# Patient Record
Sex: Female | Born: 1940 | Race: Black or African American | Hispanic: No | Smoking: Never smoker
Health system: Southern US, Community
[De-identification: ages and names within clinical notes are randomized; demographics above are authoritative.]

## PROBLEM LIST (undated history)

## (undated) DIAGNOSIS — T7840XA Allergy, unspecified, initial encounter: Secondary | ICD-10-CM

## (undated) DIAGNOSIS — S62317A Displaced fracture of base of fifth metacarpal bone. left hand, initial encounter for closed fracture: Secondary | ICD-10-CM

## (undated) DIAGNOSIS — H04129 Dry eye syndrome of unspecified lacrimal gland: Secondary | ICD-10-CM

## (undated) DIAGNOSIS — G589 Mononeuropathy, unspecified: Secondary | ICD-10-CM

## (undated) DIAGNOSIS — H269 Unspecified cataract: Secondary | ICD-10-CM

## (undated) DIAGNOSIS — M1990 Unspecified osteoarthritis, unspecified site: Secondary | ICD-10-CM

## (undated) DIAGNOSIS — D649 Anemia, unspecified: Secondary | ICD-10-CM

## (undated) DIAGNOSIS — F419 Anxiety disorder, unspecified: Secondary | ICD-10-CM

## (undated) DIAGNOSIS — E785 Hyperlipidemia, unspecified: Secondary | ICD-10-CM

## (undated) DIAGNOSIS — J300 Vasomotor rhinitis: Secondary | ICD-10-CM

## (undated) DIAGNOSIS — L509 Urticaria, unspecified: Secondary | ICD-10-CM

## (undated) DIAGNOSIS — I10 Essential (primary) hypertension: Secondary | ICD-10-CM

## (undated) DIAGNOSIS — R51 Headache: Secondary | ICD-10-CM

## (undated) DIAGNOSIS — D696 Thrombocytopenia, unspecified: Principal | ICD-10-CM

## (undated) DIAGNOSIS — F1910 Other psychoactive substance abuse, uncomplicated: Secondary | ICD-10-CM

## (undated) HISTORY — DX: Headache: R51

## (undated) HISTORY — PX: TONSILLECTOMY: SUR1361

## (undated) HISTORY — DX: Other psychoactive substance abuse, uncomplicated: F19.10

## (undated) HISTORY — PX: HERNIA REPAIR: SHX51

## (undated) HISTORY — DX: Hyperlipidemia, unspecified: E78.5

## (undated) HISTORY — DX: Thrombocytopenia, unspecified: D69.6

## (undated) HISTORY — PX: DILATION AND CURETTAGE OF UTERUS: SHX78

## (undated) HISTORY — DX: Essential (primary) hypertension: I10

## (undated) HISTORY — DX: Unspecified cataract: H26.9

## (undated) HISTORY — PX: UMBILICAL HERNIA REPAIR: SHX196

## (undated) HISTORY — PX: OTHER SURGICAL HISTORY: SHX169

## (undated) HISTORY — DX: Anxiety disorder, unspecified: F41.9

## (undated) HISTORY — DX: Urticaria, unspecified: L50.9

## (undated) HISTORY — DX: Anemia, unspecified: D64.9

## (undated) HISTORY — DX: Allergy, unspecified, initial encounter: T78.40XA

## (undated) HISTORY — DX: Dry eye syndrome of unspecified lacrimal gland: H04.129

## (undated) HISTORY — PX: ABDOMINAL HYSTERECTOMY: SHX81

## (undated) HISTORY — DX: Unspecified osteoarthritis, unspecified site: M19.90

## (undated) HISTORY — DX: Vasomotor rhinitis: J30.0

## (undated) HISTORY — DX: Displaced fracture of base of fifth metacarpal bone, left hand, initial encounter for closed fracture: S62.317A

## (undated) HISTORY — PX: APPENDECTOMY: SHX54

---

## 1998-04-17 ENCOUNTER — Ambulatory Visit (HOSPITAL_COMMUNITY): Admission: RE | Admit: 1998-04-17 | Discharge: 1998-04-17 | Payer: Self-pay | Admitting: Internal Medicine

## 1998-12-29 ENCOUNTER — Inpatient Hospital Stay (HOSPITAL_COMMUNITY): Admission: EM | Admit: 1998-12-29 | Discharge: 1998-12-30 | Payer: Self-pay

## 1999-01-11 ENCOUNTER — Ambulatory Visit (HOSPITAL_COMMUNITY): Admission: RE | Admit: 1999-01-11 | Discharge: 1999-01-11 | Payer: Self-pay | Admitting: General Surgery

## 1999-01-11 ENCOUNTER — Encounter: Payer: Self-pay | Admitting: General Surgery

## 1999-01-15 ENCOUNTER — Ambulatory Visit (HOSPITAL_COMMUNITY): Admission: RE | Admit: 1999-01-15 | Discharge: 1999-01-15 | Payer: Self-pay | Admitting: General Surgery

## 1999-01-15 ENCOUNTER — Encounter: Payer: Self-pay | Admitting: General Surgery

## 2001-02-04 ENCOUNTER — Other Ambulatory Visit: Admission: RE | Admit: 2001-02-04 | Discharge: 2001-02-04 | Payer: Self-pay | Admitting: Gynecology

## 2001-08-04 ENCOUNTER — Other Ambulatory Visit: Admission: RE | Admit: 2001-08-04 | Discharge: 2001-08-04 | Payer: Self-pay | Admitting: Gynecology

## 2002-02-11 ENCOUNTER — Other Ambulatory Visit: Admission: RE | Admit: 2002-02-11 | Discharge: 2002-02-11 | Payer: Self-pay | Admitting: *Deleted

## 2002-06-20 ENCOUNTER — Other Ambulatory Visit: Admission: RE | Admit: 2002-06-20 | Discharge: 2002-06-20 | Payer: Self-pay | Admitting: Gynecology

## 2003-01-19 ENCOUNTER — Encounter: Admission: RE | Admit: 2003-01-19 | Discharge: 2003-01-19 | Payer: Self-pay | Admitting: Family Medicine

## 2003-01-19 ENCOUNTER — Encounter: Payer: Self-pay | Admitting: Family Medicine

## 2003-03-03 ENCOUNTER — Encounter: Admission: RE | Admit: 2003-03-03 | Discharge: 2003-03-03 | Payer: Self-pay | Admitting: Internal Medicine

## 2003-03-03 ENCOUNTER — Encounter: Payer: Self-pay | Admitting: Internal Medicine

## 2003-08-21 ENCOUNTER — Other Ambulatory Visit: Admission: RE | Admit: 2003-08-21 | Discharge: 2003-08-21 | Payer: Self-pay | Admitting: Gynecology

## 2003-10-06 ENCOUNTER — Emergency Department (HOSPITAL_COMMUNITY): Admission: EM | Admit: 2003-10-06 | Discharge: 2003-10-06 | Payer: Self-pay | Admitting: Emergency Medicine

## 2004-06-12 ENCOUNTER — Ambulatory Visit: Payer: Self-pay | Admitting: Internal Medicine

## 2004-06-26 ENCOUNTER — Ambulatory Visit: Payer: Self-pay | Admitting: Internal Medicine

## 2004-06-27 ENCOUNTER — Ambulatory Visit: Payer: Self-pay | Admitting: Internal Medicine

## 2004-10-08 ENCOUNTER — Ambulatory Visit: Payer: Self-pay | Admitting: Family Medicine

## 2004-10-29 ENCOUNTER — Ambulatory Visit: Payer: Self-pay

## 2004-11-05 ENCOUNTER — Other Ambulatory Visit: Admission: RE | Admit: 2004-11-05 | Discharge: 2004-11-05 | Payer: Self-pay | Admitting: Gynecology

## 2005-06-02 ENCOUNTER — Ambulatory Visit: Payer: Self-pay | Admitting: Internal Medicine

## 2005-09-10 ENCOUNTER — Ambulatory Visit: Payer: Self-pay | Admitting: Internal Medicine

## 2005-10-10 ENCOUNTER — Ambulatory Visit: Payer: Self-pay | Admitting: Internal Medicine

## 2005-10-15 ENCOUNTER — Ambulatory Visit: Payer: Self-pay | Admitting: Internal Medicine

## 2005-10-21 ENCOUNTER — Ambulatory Visit: Payer: Self-pay | Admitting: Family Medicine

## 2005-10-21 ENCOUNTER — Ambulatory Visit: Payer: Self-pay | Admitting: Internal Medicine

## 2005-10-27 ENCOUNTER — Ambulatory Visit: Payer: Self-pay | Admitting: Gastroenterology

## 2005-11-10 ENCOUNTER — Ambulatory Visit: Payer: Self-pay | Admitting: Gastroenterology

## 2006-04-13 ENCOUNTER — Ambulatory Visit: Payer: Self-pay | Admitting: Internal Medicine

## 2006-09-29 DIAGNOSIS — I10 Essential (primary) hypertension: Secondary | ICD-10-CM | POA: Insufficient documentation

## 2006-09-29 HISTORY — DX: Essential (primary) hypertension: I10

## 2006-10-12 ENCOUNTER — Ambulatory Visit: Payer: Self-pay | Admitting: Internal Medicine

## 2006-10-12 LAB — CONVERTED CEMR LAB
BUN: 7 mg/dL (ref 6–23)
CO2: 29 meq/L (ref 19–32)
Calcium: 9.1 mg/dL (ref 8.4–10.5)
Chloride: 112 meq/L (ref 96–112)
Cholesterol: 178 mg/dL (ref 0–200)
Creatinine, Ser: 0.9 mg/dL (ref 0.4–1.2)
GFR calc Af Amer: 81 mL/min
GFR calc non Af Amer: 67 mL/min
Glucose, Bld: 99 mg/dL (ref 70–99)
HDL: 48.4 mg/dL (ref >39.0)
LDL Cholesterol: 110 mg/dL — ABNORMAL HIGH (ref 0–99)
Potassium: 4.2 meq/L (ref 3.5–5.1)
Sed Rate: 4 mm/hr (ref 0–25)
Sodium: 145 meq/L (ref 135–145)
Total CHOL/HDL Ratio: 3.7
Total CK: 543 units/L (ref 7–177)
Triglycerides: 99 mg/dL (ref 0–149)
VLDL: 20 mg/dL (ref 0–40)

## 2006-10-20 DIAGNOSIS — Z8719 Personal history of other diseases of the digestive system: Secondary | ICD-10-CM | POA: Insufficient documentation

## 2006-12-21 ENCOUNTER — Ambulatory Visit: Payer: Self-pay | Admitting: Internal Medicine

## 2007-05-14 ENCOUNTER — Ambulatory Visit: Payer: Self-pay | Admitting: Internal Medicine

## 2007-06-18 ENCOUNTER — Ambulatory Visit: Payer: Self-pay | Admitting: Family Medicine

## 2007-06-18 ENCOUNTER — Telehealth (INDEPENDENT_AMBULATORY_CARE_PROVIDER_SITE_OTHER): Payer: Self-pay | Admitting: *Deleted

## 2007-08-20 ENCOUNTER — Telehealth (INDEPENDENT_AMBULATORY_CARE_PROVIDER_SITE_OTHER): Payer: Self-pay | Admitting: *Deleted

## 2007-08-20 ENCOUNTER — Emergency Department (HOSPITAL_COMMUNITY): Admission: EM | Admit: 2007-08-20 | Discharge: 2007-08-20 | Payer: Self-pay | Admitting: Emergency Medicine

## 2007-08-23 ENCOUNTER — Ambulatory Visit: Payer: Self-pay | Admitting: Internal Medicine

## 2007-08-23 DIAGNOSIS — R51 Headache: Secondary | ICD-10-CM | POA: Insufficient documentation

## 2007-08-23 DIAGNOSIS — R519 Headache, unspecified: Secondary | ICD-10-CM | POA: Insufficient documentation

## 2007-08-27 ENCOUNTER — Telehealth: Payer: Self-pay | Admitting: Internal Medicine

## 2007-08-30 ENCOUNTER — Ambulatory Visit: Payer: Self-pay | Admitting: Cardiology

## 2007-09-02 ENCOUNTER — Telehealth: Payer: Self-pay | Admitting: Internal Medicine

## 2007-11-08 ENCOUNTER — Ambulatory Visit: Payer: Self-pay | Admitting: Family Medicine

## 2007-11-08 ENCOUNTER — Encounter: Payer: Self-pay | Admitting: Internal Medicine

## 2007-11-30 ENCOUNTER — Telehealth (INDEPENDENT_AMBULATORY_CARE_PROVIDER_SITE_OTHER): Payer: Self-pay | Admitting: *Deleted

## 2008-01-06 ENCOUNTER — Encounter: Payer: Self-pay | Admitting: Internal Medicine

## 2008-01-19 ENCOUNTER — Ambulatory Visit: Payer: Self-pay | Admitting: Internal Medicine

## 2008-01-19 DIAGNOSIS — F419 Anxiety disorder, unspecified: Secondary | ICD-10-CM

## 2008-01-19 DIAGNOSIS — F32A Depression, unspecified: Secondary | ICD-10-CM | POA: Insufficient documentation

## 2008-01-19 DIAGNOSIS — F329 Major depressive disorder, single episode, unspecified: Secondary | ICD-10-CM

## 2008-01-20 LAB — CONVERTED CEMR LAB
ALT: 30 units/L (ref 0–35)
AST: 28 units/L (ref 0–37)
Albumin: 4 g/dL (ref 3.5–5.2)
Alkaline Phosphatase: 71 units/L (ref 39–117)
BUN: 12 mg/dL (ref 6–23)
Basophils Absolute: 0.1 10*3/uL (ref 0.0–0.1)
Basophils Relative: 1.1 % (ref 0.0–3.0)
Bilirubin, Direct: 0.1 mg/dL (ref 0.0–0.3)
CO2: 28 meq/L (ref 19–32)
Calcium: 9.5 mg/dL (ref 8.4–10.5)
Chloride: 108 meq/L (ref 96–112)
Creatinine, Ser: 1 mg/dL (ref 0.4–1.2)
Eosinophils Absolute: 0.3 10*3/uL (ref 0.0–0.7)
Eosinophils Relative: 4.2 % (ref 0.0–5.0)
GFR calc Af Amer: 71 mL/min
GFR calc non Af Amer: 59 mL/min
Glucose, Bld: 99 mg/dL (ref 70–99)
HCT: 40.3 % (ref 36.0–46.0)
Hemoglobin: 13.5 g/dL (ref 12.0–15.0)
Lymphocytes Relative: 34.7 % (ref 12.0–46.0)
MCHC: 33.4 g/dL (ref 30.0–36.0)
MCV: 72.9 fL — ABNORMAL LOW (ref 78.0–100.0)
Monocytes Absolute: 0.5 10*3/uL (ref 0.1–1.0)
Monocytes Relative: 8.3 % (ref 3.0–12.0)
Neutro Abs: 3.1 10*3/uL (ref 1.4–7.7)
Neutrophils Relative %: 51.7 % (ref 43.0–77.0)
Platelets: 182 10*3/uL (ref 150–400)
Potassium: 4.1 meq/L (ref 3.5–5.1)
RBC: 5.53 M/uL — ABNORMAL HIGH (ref 3.87–5.11)
RDW: 14.1 % (ref 11.5–14.6)
Sodium: 141 meq/L (ref 135–145)
TSH: 3.33 microintl units/mL (ref 0.35–5.50)
Total Bilirubin: 0.6 mg/dL (ref 0.3–1.2)
Total Protein: 6.8 g/dL (ref 6.0–8.3)
WBC: 6.2 10*3/uL (ref 4.5–10.5)

## 2008-01-24 LAB — CONVERTED CEMR LAB: Vit D, 1,25-Dihydroxy: 39 (ref 30–89)

## 2008-06-14 ENCOUNTER — Ambulatory Visit: Payer: Self-pay | Admitting: Internal Medicine

## 2008-06-16 ENCOUNTER — Telehealth (INDEPENDENT_AMBULATORY_CARE_PROVIDER_SITE_OTHER): Payer: Self-pay | Admitting: *Deleted

## 2008-06-16 LAB — CONVERTED CEMR LAB
BUN: 10 mg/dL (ref 6–23)
CO2: 31 meq/L (ref 19–32)
Calcium: 9.4 mg/dL (ref 8.4–10.5)
Chloride: 107 meq/L (ref 96–112)
Creatinine, Ser: 0.9 mg/dL (ref 0.4–1.2)
GFR calc Af Amer: 80 mL/min
GFR calc non Af Amer: 66 mL/min
Glucose, Bld: 84 mg/dL (ref 70–99)
Potassium: 4.2 meq/L (ref 3.5–5.1)
Sed Rate: 3 mm/hr (ref 0–22)
Sodium: 141 meq/L (ref 135–145)
Total CK: 314 units/L (ref 7–177)
Vitamin B-12: 528 pg/mL (ref 211–911)

## 2008-07-10 ENCOUNTER — Ambulatory Visit: Payer: Self-pay | Admitting: Internal Medicine

## 2008-07-10 DIAGNOSIS — M549 Dorsalgia, unspecified: Secondary | ICD-10-CM | POA: Insufficient documentation

## 2008-07-14 ENCOUNTER — Encounter: Admission: RE | Admit: 2008-07-14 | Discharge: 2008-07-14 | Payer: Self-pay | Admitting: Internal Medicine

## 2008-07-18 ENCOUNTER — Telehealth (INDEPENDENT_AMBULATORY_CARE_PROVIDER_SITE_OTHER): Payer: Self-pay | Admitting: *Deleted

## 2008-10-09 ENCOUNTER — Ambulatory Visit: Payer: Self-pay | Admitting: Internal Medicine

## 2009-04-09 ENCOUNTER — Ambulatory Visit: Payer: Self-pay | Admitting: Internal Medicine

## 2009-04-10 ENCOUNTER — Ambulatory Visit: Payer: Self-pay | Admitting: Internal Medicine

## 2009-04-15 LAB — CONVERTED CEMR LAB
BUN: 10 mg/dL (ref 6–23)
Basophils Absolute: 0.1 10*3/uL (ref 0.0–0.1)
Basophils Relative: 1 % (ref 0.0–3.0)
CO2: 28 meq/L (ref 19–32)
Calcium: 9.4 mg/dL (ref 8.4–10.5)
Chloride: 105 meq/L (ref 96–112)
Cholesterol: 183 mg/dL (ref 0–200)
Creatinine, Ser: 1 mg/dL (ref 0.4–1.2)
Eosinophils Absolute: 0.3 10*3/uL (ref 0.0–0.7)
Eosinophils Relative: 4.7 % (ref 0.0–5.0)
GFR calc non Af Amer: 70.75 mL/min (ref >60)
Glucose, Bld: 93 mg/dL (ref 70–99)
HCT: 40.6 % (ref 36.0–46.0)
HDL: 44.4 mg/dL (ref >39.00)
Hemoglobin: 13.5 g/dL (ref 12.0–15.0)
LDL Cholesterol: 115 mg/dL — ABNORMAL HIGH (ref 0–99)
Lymphocytes Relative: 32.8 % (ref 12.0–46.0)
Lymphs Abs: 2 10*3/uL (ref 0.7–4.0)
MCHC: 33.2 g/dL (ref 30.0–36.0)
MCV: 71.8 fL — ABNORMAL LOW (ref 78.0–100.0)
Monocytes Absolute: 0.5 10*3/uL (ref 0.1–1.0)
Monocytes Relative: 8.1 % (ref 3.0–12.0)
Neutro Abs: 3.1 10*3/uL (ref 1.4–7.7)
Neutrophils Relative %: 53.4 % (ref 43.0–77.0)
Platelets: 159 10*3/uL (ref 150.0–400.0)
Potassium: 4.4 meq/L (ref 3.5–5.1)
RBC: 5.65 M/uL — ABNORMAL HIGH (ref 3.87–5.11)
RDW: 14.3 % (ref 11.5–14.6)
Sodium: 143 meq/L (ref 135–145)
Total CHOL/HDL Ratio: 4
Triglycerides: 120 mg/dL (ref 0.0–149.0)
VLDL: 24 mg/dL (ref 0.0–40.0)
WBC: 6 10*3/uL (ref 4.5–10.5)

## 2009-04-17 ENCOUNTER — Encounter (INDEPENDENT_AMBULATORY_CARE_PROVIDER_SITE_OTHER): Payer: Self-pay | Admitting: *Deleted

## 2009-05-22 ENCOUNTER — Ambulatory Visit: Payer: Self-pay | Admitting: Family

## 2009-05-23 ENCOUNTER — Telehealth: Payer: Self-pay | Admitting: Family

## 2009-05-23 ENCOUNTER — Telehealth (INDEPENDENT_AMBULATORY_CARE_PROVIDER_SITE_OTHER): Payer: Self-pay | Admitting: *Deleted

## 2009-06-05 ENCOUNTER — Ambulatory Visit: Payer: Self-pay | Admitting: Family

## 2009-06-05 ENCOUNTER — Ambulatory Visit: Payer: Self-pay | Admitting: Internal Medicine

## 2009-06-05 ENCOUNTER — Telehealth: Payer: Self-pay | Admitting: Family

## 2009-06-05 DIAGNOSIS — IMO0002 Reserved for concepts with insufficient information to code with codable children: Secondary | ICD-10-CM | POA: Insufficient documentation

## 2009-06-08 ENCOUNTER — Ambulatory Visit: Payer: Self-pay | Admitting: Internal Medicine

## 2009-06-19 ENCOUNTER — Telehealth: Payer: Self-pay | Admitting: Family

## 2009-06-19 ENCOUNTER — Ambulatory Visit: Payer: Self-pay | Admitting: Internal Medicine

## 2009-06-19 ENCOUNTER — Ambulatory Visit: Payer: Self-pay | Admitting: Family

## 2009-07-25 ENCOUNTER — Encounter: Payer: Self-pay | Admitting: Family

## 2009-08-16 ENCOUNTER — Encounter: Payer: Self-pay | Admitting: Family

## 2009-08-28 ENCOUNTER — Encounter: Payer: Self-pay | Admitting: Internal Medicine

## 2009-08-28 LAB — HM MAMMOGRAPHY

## 2009-09-17 ENCOUNTER — Ambulatory Visit: Payer: Self-pay | Admitting: Family

## 2009-09-17 ENCOUNTER — Telehealth: Payer: Self-pay | Admitting: Family

## 2009-09-17 DIAGNOSIS — R739 Hyperglycemia, unspecified: Secondary | ICD-10-CM | POA: Insufficient documentation

## 2009-09-17 LAB — CONVERTED CEMR LAB
BUN: 9 mg/dL (ref 6–23)
CO2: 29 meq/L (ref 19–32)
Calcium: 9 mg/dL (ref 8.4–10.5)
Chloride: 110 meq/L (ref 96–112)
Creatinine, Ser: 0.9 mg/dL (ref 0.4–1.2)
GFR calc non Af Amer: 79.79 mL/min (ref >60)
Glucose, Bld: 123 mg/dL — ABNORMAL HIGH (ref 70–99)
Hgb A1c MFr Bld: 5.9 % (ref 4.6–6.5)
Potassium: 4.1 meq/L (ref 3.5–5.1)
Sodium: 143 meq/L (ref 135–145)

## 2009-09-25 ENCOUNTER — Encounter: Payer: Self-pay | Admitting: Family

## 2009-10-04 ENCOUNTER — Encounter: Admission: RE | Admit: 2009-10-04 | Discharge: 2009-10-04 | Payer: Self-pay | Admitting: Gynecology

## 2009-10-04 ENCOUNTER — Encounter: Payer: Self-pay | Admitting: Internal Medicine

## 2009-10-08 ENCOUNTER — Ambulatory Visit: Payer: Self-pay | Admitting: Internal Medicine

## 2009-10-08 LAB — CONVERTED CEMR LAB
Bacteria, UA: NONE SEEN
Basophils Absolute: 0 10*3/uL (ref 0.0–0.1)
Basophils Relative: 0.5 % (ref 0.0–3.0)
Bilirubin Urine: NEGATIVE
Casts: NONE SEEN /lpf
Crystals: NONE SEEN
Eosinophils Absolute: 0.3 10*3/uL (ref 0.0–0.7)
Eosinophils Relative: 6.5 % — ABNORMAL HIGH (ref 0.0–5.0)
Glucose, Urine, Semiquant: NEGATIVE
HCT: 40.2 % (ref 36.0–46.0)
Hemoglobin: 13.4 g/dL (ref 12.0–15.0)
Ketones, urine, test strip: NEGATIVE
Lymphocytes Relative: 27.6 % (ref 12.0–46.0)
Lymphs Abs: 1.4 10*3/uL (ref 0.7–4.0)
MCHC: 33.3 g/dL (ref 30.0–36.0)
MCV: 72.6 fL — ABNORMAL LOW (ref 78.0–100.0)
Monocytes Absolute: 0.7 10*3/uL (ref 0.1–1.0)
Monocytes Relative: 13 % — ABNORMAL HIGH (ref 3.0–12.0)
Neutro Abs: 2.6 10*3/uL (ref 1.4–7.7)
Neutrophils Relative %: 52.4 % (ref 43.0–77.0)
Nitrite: NEGATIVE
Platelets: 140 10*3/uL — ABNORMAL LOW (ref 150.0–400.0)
Protein, U semiquant: NEGATIVE
RBC / HPF: NONE SEEN (ref <3)
RBC: 5.54 M/uL — ABNORMAL HIGH (ref 3.87–5.11)
RDW: 15.1 % — ABNORMAL HIGH (ref 11.5–14.6)
Specific Gravity, Urine: 1.01
Urobilinogen, UA: 0.2
WBC Urine, dipstick: NEGATIVE
WBC, UA: NONE SEEN cells/hpf (ref <3)
WBC: 5 10*3/uL (ref 4.5–10.5)
pH: 6

## 2009-10-09 ENCOUNTER — Encounter: Payer: Self-pay | Admitting: Internal Medicine

## 2009-10-10 ENCOUNTER — Telehealth (INDEPENDENT_AMBULATORY_CARE_PROVIDER_SITE_OTHER): Payer: Self-pay | Admitting: *Deleted

## 2009-10-26 ENCOUNTER — Ambulatory Visit: Payer: Self-pay | Admitting: Internal Medicine

## 2010-01-24 ENCOUNTER — Ambulatory Visit: Payer: Self-pay | Admitting: Internal Medicine

## 2010-01-24 ENCOUNTER — Encounter: Payer: Self-pay | Admitting: Internal Medicine

## 2010-04-15 ENCOUNTER — Telehealth (INDEPENDENT_AMBULATORY_CARE_PROVIDER_SITE_OTHER): Payer: Self-pay | Admitting: *Deleted

## 2010-04-22 ENCOUNTER — Ambulatory Visit: Payer: Self-pay | Admitting: Internal Medicine

## 2010-04-29 LAB — CONVERTED CEMR LAB
BUN: 12 mg/dL (ref 6–23)
Basophils Absolute: 0.1 10*3/uL (ref 0.0–0.1)
Basophils Relative: 1.5 % (ref 0.0–3.0)
CO2: 25 meq/L (ref 19–32)
Calcium: 9.5 mg/dL (ref 8.4–10.5)
Chloride: 107 meq/L (ref 96–112)
Creatinine, Ser: 0.9 mg/dL (ref 0.4–1.2)
Eosinophils Absolute: 0.3 10*3/uL (ref 0.0–0.7)
Eosinophils Relative: 4.9 % (ref 0.0–5.0)
GFR calc non Af Amer: 78.65 mL/min (ref >60)
Glucose, Bld: 72 mg/dL (ref 70–99)
HCT: 39.7 % (ref 36.0–46.0)
Hemoglobin: 13 g/dL (ref 12.0–15.0)
Hgb A1c MFr Bld: 6 % (ref 4.6–6.5)
Lymphocytes Relative: 68.1 % — ABNORMAL HIGH (ref 12.0–46.0)
Lymphs Abs: 3.5 10*3/uL (ref 0.7–4.0)
MCHC: 32.6 g/dL (ref 30.0–36.0)
MCV: 73.6 fL — ABNORMAL LOW (ref 78.0–100.0)
Monocytes Absolute: 0.6 10*3/uL (ref 0.1–1.0)
Monocytes Relative: 12.3 % — ABNORMAL HIGH (ref 3.0–12.0)
Neutro Abs: 0.7 10*3/uL — ABNORMAL LOW (ref 1.4–7.7)
Neutrophils Relative %: 13.2 % — ABNORMAL LOW (ref 43.0–77.0)
Platelets: 156 10*3/uL (ref 150.0–400.0)
Potassium: 4.6 meq/L (ref 3.5–5.1)
RBC: 5.39 M/uL — ABNORMAL HIGH (ref 3.87–5.11)
RDW: 15.2 % — ABNORMAL HIGH (ref 11.5–14.6)
Sodium: 142 meq/L (ref 135–145)
WBC: 5.1 10*3/uL (ref 4.5–10.5)

## 2010-05-29 ENCOUNTER — Ambulatory Visit: Payer: Self-pay | Admitting: Internal Medicine

## 2010-06-03 ENCOUNTER — Telehealth: Payer: Self-pay | Admitting: Internal Medicine

## 2010-06-13 ENCOUNTER — Telehealth: Payer: Self-pay | Admitting: Internal Medicine

## 2010-07-12 ENCOUNTER — Telehealth: Payer: Self-pay | Admitting: Internal Medicine

## 2010-07-30 NOTE — Progress Notes (Signed)
Summary: Request for Bone Density Results  Phone Note Call from Patient   Details for Reason: BONE DENSITY RESULTS Summary of Call: PATIENT WAS CALLING TO REQUEST BONE DENSITY TEST RESULTS, D/W PATIENT-NORMAL CONTINUE CALCIUM,VIT-D AND DAILY EXCERISE. WILL RECHECK IN 2 YEARS. PATIENT INFORMED LETTER WAS MAILED YESTERDAY.Shonna Chock  November 30, 2007 12:30 PM

## 2010-07-30 NOTE — Progress Notes (Signed)
Summary: results  Phone Note Outgoing Call   Summary of Call: labs ok, WBC normal, no anemia, urine culture negative please check on the patient: she can discontinue Cipro continue with bentyl if she is not any  better or worse-- let me know ( ? CT of the abdomen and pelvis) Jose E. Paz MD  October 10, 2009 11:05 AM   Follow-up for Phone Call        Left message on machine for pt to return call Monique [name]  October 10, 2009 11:39 AM discussed with pt, states she is feeling a little better since diarrhea has stopped Monique [name]  October 11, 2009 9:10 AM

## 2010-07-30 NOTE — Letter (Signed)
Summary: Results Follow up Letter  [facility] [place]  [address] [place], [place] [address]   Phone: [phone]  Fax: [phone]    04/17/2009 MRN: [id number]  Monique [name] [address] [place], [place]  [address]  Dear [name],  The following are the results of your recent test(s):  Test         Result    Pap Smear:        Normal _____  Not Normal _____ Comments: ______________________________________________________ Cholesterol: LDL(Bad cholesterol):         Your goal is less than:         HDL (Good cholesterol):       Your goal is more than: Comments:  ______________________________________________________ Mammogram:        Normal _____  Not Normal _____ Comments:  ___________________________________________________________________ Hemoccult:        Normal _____  Not normal _______ Comments:    _____________________________________________________________________ Other Tests: Please see attached labs done on 04/10/2009    We routinely do not discuss normal results over the telephone.  If you desire a copy of the results, or you have any questions about this information we can discuss them at your next office visit.   Sincerely,

## 2010-07-30 NOTE — Miscellaneous (Signed)
Summary: BONE DENSITY  Clinical Lists Changes  Orders: Added new Test order of T-Lumbar Vertebral Assessment (77082) - Signed Added new Test order of T-Bone Densitometry (77080) - Signed 

## 2010-07-30 NOTE — Assessment & Plan Note (Signed)
Summary: 3 month ov//ph   Vital Signs:  Patient profile:   70 year old female Height:      60 inches Weight:      168.6 pounds BMI:     33.05 Pulse rate:   76 / minute Pulse rhythm:   regular BP sitting:   144 / 70  (left arm) Cuff size:   large  Vitals Entered By: Shary Decamp (October 09, 2008 9:00 AM) Comments rov - fasting Shary Decamp  October 09, 2008 9:06 AM    History of Present Illness: ROV    Current Medications (verified): 1)  Spironolactone 25 Mg Tabs (Spironolactone) .Marland Kitchen.. 1 By Mouth Once Daily 2)  Carvedilol 6.25 Mg  Tabs (Carvedilol) .Marland Kitchen.. 1 By Mouth Two Times A Day 3)  Remifemin 4)  Calcium/vitamin D 5)  Mvi 6)  Adult Aspirin Ec Low Strength 81 Mg  Tbec (Aspirin) 7)  Diazepam 5 Mg Tabs (Diazepam) .Marland Kitchen.. 1 By Mouth Every 12 Hours As Needed  Allergies (verified): 1)  ! Pcn 2)  ! Ceremenux 3)  ! Doxycycline 4)  ! Zithromax 5)  ! * Climara Patch 6)  ! * Bee Stings  Past History:  Past Medical History:    Hypertension (09/29/2006)    Cholelithiasis    Headache    Anxiety  Past Surgical History:    Reviewed history from 01/19/2008 and no changes required:    Appendectomy    Hysterectomy, no oophorectomy    Tonsillectomy  Social History:    Never Smoked    Alcohol use-no    Married    Retired, was a Education officer, environmental     husband has prostate ca, trying to stay strong, "not doing the best"    takes care of mom, 84y/o who is blind   Review of Systems       no ambulatory BPs , good medication compliance , needs RF has pruritus and a rash , started at the area of contact w/ her wrist watch last month (watch is not new); denies itchy eyes and nose  MS:  still occ LBP but better , no radiation , went back on valium to relax her back,  did not see ortho.  Physical Exam  General:  alert and well-developed.   Lungs:  normal respiratory effort, no intercostal retractions, no accessory muscle use, and normal breath sounds.   Heart:  normal rate, regular rhythm,  and no murmur.   Extremities:  no pretibial edema bilaterally    Impression & Recommendations:  Problem # 1:  BACK PAIN (ICD-724.5) MRI showed severe OA ,pain better, she did not see a ortho surgeon. Since she is not ready for any surgery at this point will cont.w/ conservative treatment  The following medications were removed from the medication list:    Ibuprofen 800 Mg Tabs (Ibuprofen) .Marland Kitchen... 1 by mouth q8h as needed pain Her updated medication list for this problem includes:    Adult Aspirin Ec Low Strength 81 Mg Tbec (Aspirin)  Problem # 2:  HYPERTENSION (ICD-401.9) RF see instructions  Her updated medication list for this problem includes:    Spironolactone 25 Mg Tabs (Spironolactone) .Marland Kitchen... 1 by mouth once daily    Carvedilol 6.25 Mg Tabs (Carvedilol) .Marland Kitchen... 1 by mouth two times a day  BP today: 144/70 Prior BP: 118/84 (07/10/2008)  Labs Reviewed: K+: 4.2 (06/14/2008) Creat: : 0.9 (06/14/2008)   Chol: 178 (10/12/2006)   HDL: 48.4 (10/12/2006)   LDL: 110 (10/12/2006)   TG: 99 (10/12/2006)  Problem # 3:  allergic Rx to wrist watch band? see HPI rec to change wrist watch band zyrtec  Problem # 4:  ANXIETY (ICD-300.00) at the end of the OV  become emotional. Anxious and depress about her mom's situation, she is taking care of her. patient was counseled, to call if needs  help  (?SSRIs) The following medications were removed from the medication list:    Amitriptyline Hcl 50 Mg Tabs (Amitriptyline hcl) .Marland Kitchen... 1 by mouth at bedtime (dr Richardean Chimera) Her updated medication list for this problem includes:    Diazepam 5 Mg Tabs (Diazepam) .Marland Kitchen... 1 by mouth every 12 hours as needed  Complete Medication List: 1)  Spironolactone 25 Mg Tabs (Spironolactone) .Marland Kitchen.. 1 by mouth once daily 2)  Carvedilol 6.25 Mg Tabs (Carvedilol) .Marland Kitchen.. 1 by mouth two times a day 3)  Remifemin  4)  Calcium/vitamin D  5)  Mvi  6)  Adult Aspirin Ec Low Strength 81 Mg Tbec (Aspirin) 7)  Diazepam 5 Mg Tabs  (Diazepam) .Marland Kitchen.. 1 by mouth every 12 hours as needed  Patient Instructions: 1)  Check your blood pressure 2 or 3 times a week. If it is more than 140/85 consistently,please let us know  2)  Please schedule a YEARLY CHECK UP in 4 months .  3)  zyrtec for allergies instead of benadryl  Prescriptions: CARVEDILOL 6.25 MG  TABS (CARVEDILOL) 1 by mouth two times a day  #180 x 2   Entered by:   Shary Decamp   Authorized by:   Nolon Rod. Keandra Medero MD   Signed by:   Shary Decamp on 10/09/2008   Method used:   [facility]        [facility] [address] [phone]* (retail)       [address]       [place], [place]  [address]       Ph: [phone] or [phone]       Fax: [phone]   RxID:   [id number]

## 2010-07-30 NOTE — Miscellaneous (Signed)
Summary: PT Initial Eval/[facility]  PT Initial Eval/[facility]   Imported By: Lanelle Bal 09/19/2009 10:29:04  _____________________________________________________________________  External Attachment:    Type:   Image     Comment:   External Document

## 2010-07-30 NOTE — Progress Notes (Signed)
  Phone Note Refill Request    Follow-up for Phone Call        Levaquin not sent to pharmacy, will resend    Prescriptions: LEVAQUIN 500 MG TABS (LEVOFLOXACIN) one tablet by mouth daily x 7 days  #14 x 0   Entered and Authorized by:   Lemont Fillers FNP   Signed by:   Lemont Fillers FNP on 05/23/2009   Method used:   [facility]        [facility] [address] [phone]* (retail)       [address]       [place], [place]  [address]       Ph: [phone] or [phone]       Fax: [phone]   RxID:   [id number]

## 2010-07-30 NOTE — Miscellaneous (Signed)
Summary: MMG   Clinical Lists Changes  Observations: Added new observation of MAMMRECACT: Screening mammogram in 1 year.    (08/28/2009 16:45) Added new observation of MAMMOGRAM: Assessment: BIRADS 1.  (08/28/2009 16:45)      Mammogram  Procedure date:  08/28/2009  Findings:      Assessment: BIRADS 1.   Mammogram  Procedure date:  08/28/2009  Comments:      Screening mammogram in 1 year.      Mammogram  Procedure date:  08/28/2009  Findings:      Assessment: BIRADS 1.   Mammogram  Procedure date:  08/28/2009  Comments:      Screening mammogram in 1 year.

## 2010-07-30 NOTE — Assessment & Plan Note (Signed)
Summary: 2 WEEK FOLLOWUP///SPH   Vital Signs:  Patient profile:   70 year old female Weight:      174.8 pounds Pulse rate:   68 / minute BP sitting:   136 / 80  (right arm)  Vitals Entered By: Doristine Devoid (June 19, 2009 8:11 AM) CC: 2 week f/u   CC:  2 week f/u.  History of Present Illness: Ms Burr is a 70 year old female who presents today to follow up on RUL pneumonia.  She completed vantin yesterday.  Notes that she feels better- but has totally regained her energy.  She continues to have left shoulder pain- briefly noted improvement, then pain got worse.  Notes that mobic has helped some.    Allergies: 1)  ! Pcn 2)  ! Ceremenux 3)  ! Doxycycline 4)  ! Zithromax 5)  ! * Climara Patch 6)  ! * Bee Stings  Review of Systems       Denies fever, notes occasional dry cough  Physical Exam  General:  Well-developed,well-nourished,in no acute distress; alert,appropriate and cooperative throughout examination Head:  Normocephalic and atraumatic without obvious abnormalities. No apparent alopecia or balding. Ears:  External ear exam shows no significant lesions or deformities.  Otoscopic examination reveals clear canals, tympanic membranes are intact bilaterally without bulging, retraction, inflammation or discharge. Hearing is grossly normal bilaterally. Mouth:  pharynx pink and moist.   Neck:  No deformities, masses, or tenderness noted. Lungs:  Normal respiratory effort, chest expands symmetrically. Lungs are clear to auscultation, no crackles or wheezes. Heart:  Normal rate and regular rhythm. S1 and S2 normal without gallop, murmur, click, rub or other extra sounds. Msk:  + crepitus L shoulder, + pain with ROM, no significant swelling   Impression & Recommendations:  Problem # 1:  BACTERIAL PNEUMONIA (ICD-482.9) Assessment Improved Clinically improved,  Patient will complete f/u cxr today.   The following medications were removed from the medication list:  Vantin 100 Mg Tabs (Cefpodoxime proxetil) .Marland Kitchen..Marland Kitchen Two tablet by mouth two times a day x 10 days  Problem # 2:  SHOULDER STRAIN, LEFT (ICD-840.9) Assessment: Deteriorated  Pain worse despite mobic and exercises, will refer to orthopedics.  Patient wishes to use OTC motrin or aleve instead of mobic at this point.  Orders: Orthopedic Referral (Ortho)  Complete Medication List: 1)  Spironolactone 25 Mg Tabs (Spironolactone) .Marland Kitchen.. 1 by mouth once daily 2)  Carvedilol 6.25 Mg Tabs (Carvedilol) .Marland Kitchen.. 1 by mouth two times a day 3)  Calcium/vitamin D  4)  Mvi  5)  Adult Aspirin Ec Low Strength 81 Mg Tbec (Aspirin) 6)  Diazepam 5 Mg Tabs (Diazepam) .... Pt holding right now "trying not to use" 7)  Otc Allergy  8)  Glucosamine  9)  Pataday Gtts   Patient Instructions: 1)  Please complete your chest x-ray today. 2)  You will be contacted by our referral coordinator about your referral to orthopedics.  3)  Have a great Holiday! 4)  Please schedule a follow-up appointment in 3 months- sooner if problems or concerns.

## 2010-07-30 NOTE — Progress Notes (Signed)
Summary: not feeling any better  Phone Note Call from Patient Call back at Home Phone [phone]   Caller: Patient Summary of Call: patient was seen 11/30 for hip and leg pain---  instructions state to call back in 10 days if she was not feeling better---  called back today to say that she is not any better at all--wanted Dr Drue Novel to know Initial call taken by: Jerolyn Shin,  June 03, 2010 12:25 PM  Follow-up for Phone Call        Please advise. Lucious Groves CMA  June 03, 2010 12:43 PM   Additional Follow-up for Phone Call Additional follow up Details #1::        arrange a ortho referal Rhylynn Perdomo E. Markel Mergenthaler MD  June 03, 2010 5:07 PM     Additional Follow-up for Phone Call Additional follow up Details #2::    left message on voicemail to call back to office. Lucious Groves CMA  June 04, 2010 11:02 AM   Patient notified and states that she has an ortho MD and she will call them for an appt. Lucious Groves CMA  June 05, 2010 11:46 AM

## 2010-07-30 NOTE — Miscellaneous (Signed)
Summary: pelvic u/s (ordered by gyn)--essentially neg  US Pelvis Complete - STATUS: Final  IMAGE                                     Perform Date: 7 Apr11 13:08  Ordered By: [place] Cellar  ,        Ordered Date: 7 Apr11 12:21  Facility: [facility]                              Department: [place]  Service Report Text  [facility] Accession Number: [id number]      Clinical Data: Pelvic pain.    TRANSABDOMINAL AND TRANSVAGINAL ULTRASOUND OF PELVIS    Technique:  Both transabdominal and transvaginal ultrasound   examinations of the pelvis were performed including evaluation of   the uterus, ovaries, adnexal regions, and pelvic cul-de-sac.    Comparison:  None    Findings:    Uterus status post hysterectomy.    Endometrium status post hysterectomy.    Right Ovary not visualized due to bowel gas.    Left Ovary not visualized due to bowel gas.    Other Findings:  No pelvic mass lesions are seen.  The bladder   appears normal.  No free pelvic fluid collections.    IMPRESSION:    1.  Status post hysterectomy.   2.  The ovaries are not visualized sonographically.   3.  No pelvic mass or free pelvic fluid collection.    Read By:  Cyndie Chime,  M.D.   Released By:  Cyndie Chime,  M.D.  Additional Information  HL7 RESULT STATUS : F  External image : [id number]  External IF Update Timestamp : 2009-10-04:13:16:32.000000   Clinical Lists Changes

## 2010-07-30 NOTE — Miscellaneous (Signed)
Summary: BONE DENSITY  Clinical Lists Changes  Orders: Added new Test order of T-Bone Densitometry (77080) - Signed Added new Test order of T-Lumbar Vertebral Assessment (77082) - Signed 

## 2010-07-30 NOTE — Progress Notes (Signed)
Summary: lab results  Phone Note Outgoing Call Call back at [facility] Phone [phone] Call back at Work Phone [phone]   Details for Reason: LAB RESULTS: total CK slightly elevated with a normal sed rate (doubt myositis) advise patient : labs ok  and follow-up as planned Signed by [facility] E. Paz MD on 06/16/2008 at 6:43 AM  Summary of Call: no answer @ home number left message on cell # to have patient return call .....Marland KitchenMarland KitchenShary Decamp  June 16, 2008 11:54 AM  discussed with patient ........Marland KitchenShary Decamp  June 16, 2008 1:42 PM

## 2010-07-30 NOTE — Assessment & Plan Note (Signed)
Summary: hip/leg pain/cbs   Vital Signs:  Patient profile:   70 year old female Weight:      173.38 pounds Pulse rate:   83 / minute Pulse rhythm:   regular BP sitting:   132 / 84  (left arm) Cuff size:   large  Vitals Entered By: Army Fossa CMA (May 29, 2010 1:33 PM) CC: Pt here c/o pain in her (L) Hip that goes down to her leg.  Comments [facility]   History of Present Illness: developed bilateral low back pain earlier today. Pain radiates to the left gluteal area as will as the  anterior aspect of the left leg.  pain is steady Aleve helped a little    Review of systems Denies any fevers No lower extremity paresthesias No bladder or bowel incontinence No rash  Current Medications (verified): 1)  Spironolactone 25 Mg Tabs (Spironolactone) .Marland Kitchen.. 1 By Mouth Once Daily 2)  Carvedilol 6.25 Mg  Tabs (Carvedilol) .Marland Kitchen.. 1 By Mouth Two Times A Day 3)  Calcium/vitamin D 4)  Mvi 5)  Adult Aspirin Ec Low Strength 81 Mg  Tbec (Aspirin) 6)  Otc Allergy 7)  Glucosamine 8)  Aleve 220 Mg Tabs (Naproxen Sodium) .... As Needed For Pain 9)  Systaine 10)  Restasis 0.05 % Emul (Cyclosporine)  Allergies (verified): 1)  ! Pcn 2)  ! Ceremenux 3)  ! Doxycycline 4)  ! Zithromax 5)  ! * Climara Patch 6)  ! * Bee Stings  Past History:  Past Medical History: Reviewed history from 04/22/2010 and no changes required. Hypertension (09/29/2006) Cholelithiasis Headache Anxiety bone density normal 12/2009  Past Surgical History: Reviewed history from 01/19/2008 and no changes required. Appendectomy Hysterectomy, no oophorectomy Tonsillectomy  Social History: Reviewed history from 04/22/2010 and no changes required. Never Smoked Alcohol use-no Married Retired, was a Education officer, environmental  husband has prostate ca, trying to stay strong, he is doing ok takes care of mom, 84y/o who is blind   Physical Exam  General:  alert and well-developed.   Abdomen:  soft,  non-tender, no distention, and no masses.   Msk:   no tender to palpation at the lower back Extremities:  no lower extremity edema Neurologic:  alert & oriented X3, strength normal in all extremities, sensation intact to pinprick, gait normal, and DTRs symmetrical and normal.  straight leg test negative   Impression & Recommendations:  Problem # 1:  BACK PAIN (ICD-724.5) history of back pain before, MRI 06/2008: 1.  Anterolisthesis at L4-L5 measures 7 mm and is associated with severe facet hypertrophy, and possibly chronic pars defects. Combined there is moderate spinal, severe right and mild-moderate left neural foraminal stenosis. 2.  Moderate to large right paracentral disc protrusion combined with facet hypertrophy at T11-T12 results in mild spinal stenosis and severe right T11 neural foraminal stenosis. 3.  Similar multifactorial mild spinal stenosis at T10-T11. 4.  Multifactorial mild bilateral lateral recess and neural foraminal stenosis at L3-L4. 5.  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  Will treat this acute episode conservatively, refer to orthopedic surgery if not better. She has borderline diabetes, we'll avoid steroids if possible. see instructions    Her updated medication list for this problem includes:    Adult Aspirin Ec Low Strength 81 Mg Tbec (Aspirin)    Aleve 220 Mg Tabs (Naproxen sodium) .Marland Kitchen... As needed for pain    Hydrocodone-acetaminophen 5-325 Mg Tabs (Hydrocodone-acetaminophen) ..... One or 2 tablets  by mouth every 6 hours as  needed for pain    Flexeril 10 Mg Tabs (Cyclobenzaprine hcl) ..... One by mouth twice a day as needed for pain. will cause drowsiness  Problem # 2:  DM (ICD-250.00)  Her updated medication list for this problem includes:    Adult Aspirin Ec Low Strength 81 Mg Tbec (Aspirin)  Labs Reviewed: Creat: 0.9 (04/22/2010)    Reviewed HgBA1c results: 6.0 (04/22/2010)  5.9 (09/17/2009)  Complete Medication List: 1)   Spironolactone 25 Mg Tabs (Spironolactone) .Marland Kitchen.. 1 by mouth once daily 2)  Carvedilol 6.25 Mg Tabs (Carvedilol) .Marland Kitchen.. 1 by mouth two times a day 3)  Calcium/vitamin D  4)  Mvi  5)  Adult Aspirin Ec Low Strength 81 Mg Tbec (Aspirin) 6)  Otc Allergy  7)  Glucosamine  8)  Aleve 220 Mg Tabs (Naproxen sodium) .... As needed for pain 9)  Systaine  10)  Restasis 0.05 % Emul (Cyclosporine) 11)  Hydrocodone-acetaminophen 5-325 Mg Tabs (Hydrocodone-acetaminophen) .... One or 2 tablets  by mouth every 6 hours as needed for pain 12)  Flexeril 10 Mg Tabs (Cyclobenzaprine hcl) .... One by mouth twice a day as needed for pain. will cause drowsiness  Patient Instructions: 1)  rest 2)  warm compress 3)  Use medications as prescribed. Thue will cause you to be drowsy. 4)  Call if not better in 10 days Prescriptions: FLEXERIL 10 MG TABS (CYCLOBENZAPRINE HCL) one by mouth twice a day as needed for pain. Will cause drowsiness  #30 x 0   Entered and Authorized by:   Nolon Rod. Shawntel Farnworth MD   Signed by:   Nolon Rod. Dewanda Fennema MD on 05/29/2010   Method used:   Print then Give to Patient   RxID:   [id number] HYDROCODONE-ACETAMINOPHEN 5-325 MG TABS (HYDROCODONE-ACETAMINOPHEN) one or 2 tablets  by mouth every 6 hours as needed for pain  #40 x 0   Entered and Authorized by:   Nolon Rod. Dimitrius Steedman MD   Signed by:   Nolon Rod. Sandi Towe MD on 05/29/2010   Method used:   Print then Give to Patient   RxID:   [id number]    Orders Added: 1)  Est. Patient Level III [[address]]

## 2010-07-30 NOTE — Letter (Signed)
   [place] [facility] [address] [place], [place]  [address] Phone: [phone]      September 25, 2009   [facility] [place] [address] [place], [place] [address]  RE:  LAB RESULTS  Dear  Ms. Linch,  The following is an interpretation of your most recent lab tests.  Please take note of any instructions provided or changes to medications that have resulted from your lab work.    DIABETIC STUDIES:  Good - no changes needed Blood Glucose: 123   HgbA1C: 5.9      Sincerely Yours,    Lemont Fillers FNP

## 2010-07-30 NOTE — Miscellaneous (Signed)
Summary: PT Progress Note/[facility]  PT Progress Note/[facility]   Imported By: Lanelle Bal 09/19/2009 10:27:08  _____________________________________________________________________  External Attachment:    Type:   Image     Comment:   External Document
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
## 2010-07-30 NOTE — Miscellaneous (Signed)
Summary: Controlled Substance Agreement/[place] [facility]  Controlled Substance Agreement/[place] [facility]   Imported By: Lanelle Bal 10/10/2008 10:20:13  _____________________________________________________________________  External Attachment:    Type:   Image     Comment:   External Document

## 2010-07-30 NOTE — Assessment & Plan Note (Signed)
Summary: f/u pneumonia/swh   Vital Signs:  Patient profile:   70 year old female Height:      60 inches Weight:      174.6 pounds Temp:     98.1 degrees F BP sitting:   140 / 80  Vitals Entered By: Shary Decamp (June 08, 2009 7:54 AM) CC: f/u on pneumonia - feeling better   History of Present Illness: pneumonia,RUL,  good medication compliance w/ abx, feels better L shoulder pain, better, ROM improved   Current Medications (verified): 1)  Spironolactone 25 Mg Tabs (Spironolactone) .Marland Kitchen.. 1 By Mouth Once Daily 2)  Carvedilol 6.25 Mg  Tabs (Carvedilol) .Marland Kitchen.. 1 By Mouth Two Times A Day 3)  Calcium/vitamin D 4)  Mvi 5)  Adult Aspirin Ec Low Strength 81 Mg  Tbec (Aspirin) 6)  Diazepam 5 Mg Tabs (Diazepam) .... Pt Holding Right Now "trying Not To Use" 7)  Otc Allergy 8)  Glucosamine 9)  Pataday Gtts 10)  Mobic 7.5 Mg Tabs (Meloxicam) .... One Tabet By Mouth Daily As Needed For Left Shoulder Pain 11)  Tessalon Perles 100 Mg Caps (Benzonatate) .... One Cap By Mouth Three Times A Day As Needed Cough 12)  Vantin 100 Mg Tabs (Cefpodoxime Proxetil) .... Two Tablet By Mouth Two Times A Day X 10 Days  Allergies (verified): 1)  ! Pcn 2)  ! Ceremenux 3)  ! Doxycycline 4)  ! Zithromax 5)  ! * Climara Patch 6)  ! * Bee Stings  Past History:  Past Medical History: Reviewed history from 04/09/2009 and no changes required. Hypertension (09/29/2006) Cholelithiasis Headache Anxiety  Past Surgical History: Reviewed history from 01/19/2008 and no changes required. Appendectomy Hysterectomy, no oophorectomy Tonsillectomy  Social History: Reviewed history from 10/09/2008 and no changes required. 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  non-toxic  Lungs:  Normal respiratory effort, chest expands symmetrically. Lungs are clear to auscultation, slt decreased BS, no crackles or wheezes. Heart:  Normal rate and regular rhythm. S1 and S2 normal without gallop, murmur, click, rub or other extra sounds. Msk:  left shoulder tender to palpation at the deltoid area w/o redness L shoulder ROM w/ minimal limitation,  mild  pain with ROM   Impression & Recommendations:  Problem # 1:  BACTERIAL PNEUMONIA (ICD-482.9)  improving  The following medications were removed from the medication list:    Levaquin 500 Mg Tabs (Levofloxacin) ..... One tablet by mouth daily x 7 days Her updated medication list for this problem includes:    Vantin 100 Mg Tabs (Cefpodoxime proxetil) .Marland Kitchen..Marland Kitchen Two tablet by mouth two times a day x 10 days  Orders: T-2 View CXR (71020TC)  Problem # 2:  SHOULDER STRAIN, LEFT (ICD-840.9) improving   Complete Medication List: 1)  Spironolactone 25 Mg Tabs (Spironolactone) .Marland Kitchen.. 1 by mouth once daily 2)  Carvedilol 6.25 Mg Tabs (Carvedilol) .Marland Kitchen.. 1 by mouth two times a day 3)  Calcium/vitamin D  4)  Mvi  5)  Adult Aspirin Ec Low Strength 81 Mg Tbec (Aspirin) 6)  Diazepam 5 Mg Tabs (Diazepam) .... Pt holding right now "trying not to use" 7)  Otc Allergy  8)  Glucosamine  9)  Pataday  Gtts  10)  Mobic 7.5 Mg Tabs (Meloxicam) .... One tabet by mouth daily as needed for left shoulder pain 11)  Tessalon Perles 100 Mg Caps (Benzonatate) .... One cap by mouth three times a day as needed cough 12)  Vantin 100 Mg Tabs (Cefpodoxime proxetil) .... Two tablet by mouth two times a day x 10 days  Patient Instructions: 1)  finish vantin (antibiotic) 2)  continue mobic for shoulder pain, watch for stomach irritation, if not back to normal in 2 weeks , let us know. Exercise the shoulder  3)  Chest XR in 2 WEEKS [Prescriptions]

## 2010-07-30 NOTE — Progress Notes (Signed)
  Phone Note Outgoing Call   Summary of Call: Pls call patient and let her know that her sugar was slightly elevated today,  I would like for her to return at a time that is convenient to complet an A1C please (790.29)  We will call her back for a visit if this is elevated.   Initial call taken by: Lemont Fillers FNP,  September 17, 2009 1:33 PM  Follow-up for Phone Call        Notified pt. of elevated bs and need to return for hgb a1c. Pt. is able to come this afternoon. Appt. scheduled for 4pm at [facility] office today. Advised pt. we would call with further instructions once we receive the result. Follow-up by: Mervin Kung CMA,  September 17, 2009 1:46 PM  New Problems: HYPERGLYCEMIA, MILD (ICD-790.29)   New Problems: HYPERGLYCEMIA, MILD (ICD-790.29)

## 2010-07-30 NOTE — Assessment & Plan Note (Signed)
Summary: CHECK HER FOR VIRAL MENGITIS    PH  Medications Added SPIRONOLACTONE 25 MG TABS (SPIRONOLACTONE) Take 2 tablet by mouth once a day * CALCIUM/VITAMIN D  * REMIFEMIN  * MVI  ADULT ASPIRIN EC LOW STRENGTH 81 MG  TBEC (ASPIRIN)         Vital Signs:  Patient Profile:   70 Years Old Female Weight:      173 pounds Temp:     98.3 degrees F BP sitting:   122 / 80  (left arm) Cuff size:   regular  Vitals Entered By: Shary Decamp (December 21, 2006 4:33 PM)               Chief Complaint:  daughter-in-law admitted to hosp last week dx viral meningitis; pt feels fine.  History of Present Illness: see above doing well       Review of Systems       denies any fever, headache, URI type symptoms.   Physical Exam  General:     alert and well-developed.   Neck:     full ROM.   Lungs:     normal respiratory effort, no intercostal retractions, and no accessory muscle use.   Heart:     normal rate, regular rhythm, and no murmur.      Impression & Recommendations:  Problem # 1:  exposure to a virus  Problem # 2:  HYPERTENSION (ICD-401.9) well-controlled Her updated medication list for this problem includes:    Spironolactone 25 Mg Tabs (Spironolactone) .Marland Kitchen... Take 2 tablet by mouth once a day   Medications Added to Medication List This Visit: 1)  Spironolactone 25 Mg Tabs (Spironolactone) .... Take 2 tablet by mouth once a day 2)  Calcium/vitamin D  3)  Remifemin  4)  Mvi  5)  Adult Aspirin Ec Low Strength 81 Mg Tbec (Aspirin)   Patient Instructions: 1)  if you feel ill,  have high fever, headaches, cold  type symptoms: call us right away

## 2010-07-30 NOTE — Progress Notes (Signed)
  Phone Note Outgoing Call   Call placed by: Arman Filter,  May 23, 2009 12:32 PM Summary of Call: Patient informed script for Levaquin was sent to the pharmacy Initial call taken by: Arman Filter,  May 23, 2009 12:33 PM

## 2010-07-30 NOTE — Assessment & Plan Note (Signed)
Summary: cough, congestion, weak- jr   Vital Signs:  Patient profile:   70 year old female Weight:      171 pounds BMI:     33.52 O2 Sat:      95 % Temp:     98 degrees F Pulse rate:   96 / minute Pulse rhythm:   regular Resp:     18 per minute BP sitting:   130 / 70  (left arm) Cuff size:   large  Vitals Entered By: Arman Filter (May 22, 2009 8:44 AM) CC: c/o cold  onset 1 week ago, sore throat, headaches, pain in left foot   CC:  c/o cold  onset 1 week ago, sore throat, headaches, and pain in left foot.  History of Present Illness: Monique [name] is a 70 year old female who presents today with 1 week history of productive cough. Patient notes +yellow phlegm.  Patient is taking delsym and mucinex with some improvement.    Allergies: 1)  ! Pcn 2)  ! Ceremenux 3)  ! Doxycycline 4)  ! Zithromax 5)  ! * Climara Patch 6)  ! * Bee Stings  Past History:  Past Medical History: Last updated: 04/09/2009 Hypertension (09/29/2006) Cholelithiasis Headache Anxiety  Review of Systems       Denies fever, + nasal congestion- + yellow nasal discharge, + sore throat- but feeling a little better  Physical Exam  General:  Well-developed,well-nourished,in no acute distress; alert,appropriate and cooperative throughout examination Mouth:  Oral mucosa and oropharynx without lesions or exudates.  Teeth in good repair. Lungs:  Normal respiratory effort, chest expands symmetrically. Lungs are clear to auscultation, no crackles or wheezes. Heart:  Normal rate and regular rhythm. S1 and S2 normal without gallop, murmur, click, rub or other extra sounds.   Impression & Recommendations:  Problem # 1:  ACUTE BRONCHITIS (ICD-466.0)  Her updated medication list for this problem includes:    Mucinex 600 Mg Xr12h-tab (Guaifenesin) .Marland Kitchen... 1 tab by mouth every 12 hours    Delsym Night Time Multi-sympt 5-6.25-10-325 Mg/34ml Liqd (Phenyleph-doxylamine-dm-apap) .Marland KitchenMarland KitchenMarland KitchenMarland Kitchen 10 ml by mouth every 12 hours  for cough    Levaquin 500 Mg Tabs (Levofloxacin) ..... One tablet by mouth daily x 7 days  Problem # 2:  HYPERTENSION (ICD-401.9) Assessment: Unchanged  Her updated medication list for this problem includes:    Spironolactone 25 Mg Tabs (Spironolactone) .Marland Kitchen... 1 by mouth once daily    Carvedilol 6.25 Mg Tabs (Carvedilol) .Marland Kitchen... 1 by mouth two times a day  BP today: 130/70 Prior BP: 120/80 (04/09/2009)  Labs Reviewed: K+: 4.4 (04/10/2009) Creat: : 1.0 (04/10/2009)   Chol: 183 (04/10/2009)   HDL: 44.40 (04/10/2009)   LDL: 115 (04/10/2009)   TG: 120.0 (04/10/2009)  Complete Medication List: 1)  Spironolactone 25 Mg Tabs (Spironolactone) .Marland Kitchen.. 1 by mouth once daily 2)  Carvedilol 6.25 Mg Tabs (Carvedilol) .Marland Kitchen.. 1 by mouth two times a day 3)  Calcium/vitamin D  4)  Mvi  5)  Adult Aspirin Ec Low Strength 81 Mg Tbec (Aspirin) 6)  Diazepam 5 Mg Tabs (Diazepam) .... Pt holding right now "trying not to use" 7)  Otc Allergy  8)  Glucosamine  9)  Pataday Gtts  10)  Mucinex 600 Mg Xr12h-tab (Guaifenesin) .Marland Kitchen.. 1 tab by mouth every 12 hours 11)  Delsym Night Time Multi-sympt 5-6.25-10-325 Mg/24ml Liqd (Phenyleph-doxylamine-dm-apap) .Marland Kitchen.. 10 ml by mouth every 12 hours for cough 12)  Levaquin 500 Mg Tabs (Levofloxacin) .... One tablet by mouth daily  x 7 days  Patient Instructions: 1)  Please call if your symptoms do not improve in then next 72 hours or if your symptoms worsen, or if you develop increased weakness/fever over 101  Current Allergies (reviewed today): ! PCN ! CEREMENUX ! DOXYCYCLINE ! ZITHROMAX ! [facility] PATCH ! * BEE STINGS

## 2010-07-30 NOTE — Progress Notes (Signed)
  Phone Note Outgoing Call   Call placed by: Lemont Fillers FNP,  June 19, 2009 5:45 PM Summary of Call: Called patient, reviewed CXR results. Initial call taken by: Lemont Fillers FNP,  June 19, 2009 5:45 PM

## 2010-07-30 NOTE — Assessment & Plan Note (Signed)
Summary: ROV 6 MONTHS,CBS   Vital Signs:  Patient Profile:   70 Years Old Female Weight:      173.6 pounds Pulse rate:   96 / minute Pulse rhythm:   regular BP sitting:   160 / 90  (left arm)  Vitals Entered By: Shary Decamp (May 14, 2007 8:41 AM)                 Chief Complaint:  rov - fasting.  History of Present Illness: ROV    Past Medical History:    Reviewed history from 10/20/2006 and no changes required:       Hypertension (09/29/2006)       Cholelithiasis   Social History:    Reviewed history from 10/20/2006 and no changes required:       Never Smoked       Alcohol use-no    Review of Systems       amb BP 122/84 yesterday Usually normal  ENT      nasal congestion x 1 week ear dyscomfort as well  CV      Denies swelling of feet.  Resp      Denies shortness of breath.   Physical Exam  General:     alert and well-developed.   Ears:     nl Nose:     slt congested Mouth:     no red Lungs:     normal respiratory effort, no intercostal retractions, and no accessory muscle use.   Heart:     normal rate, regular rhythm, and no murmur.   Extremities:     no edema    Impression & Recommendations:  Problem # 1:  HYPERTENSION (ICD-401.9) monitor BP as out pt Her updated medication list for this problem includes:    Spironolactone 25 Mg Tabs (Spironolactone) .Marland Kitchen... Take 2 tablet by mouth once a day  BP today: 160/90 Prior BP: 122/80 (12/21/2006)  Labs Reviewed: Creat: 0.9 (10/12/2006) Chol: 178 (10/12/2006)   HDL: 48.4 (10/12/2006)   LDL: 110 (10/12/2006)   TG: 99 (10/12/2006)   Problem # 2:  HEALTH SCREENING (ICD-V70.0) last yearly 09-2006 flu shot and pneumonia shot today sees gyn Cscope 10-2005 (-) Td 2006  Problem # 3:  h/o abnormal CK 543 (09-2006) monitor w/next blood check  Problem # 4:  URI sx OTCs  Complete Medication List: 1)  Spironolactone 25 Mg Tabs (Spironolactone) .... Take 2 tablet by mouth once a  day 2)  Calcium/vitamin D  3)  Remifemin  4)  Mvi  5)  Adult Aspirin Ec Low Strength 81 Mg Tbec (Aspirin)  Other Orders: Pneumoccal Vaccine Adm- Medicare (G0009) Admin 1st Vaccine (16109) Influenza Vaccine MCR (60454)   Patient Instructions: 1)  check your blood pressure from time to time,if it is more than 140/90 more than one time, ley [place] know 2)  Please schedule a follow-up appointment in 4 to 5 months.    Prescriptions: SPIRONOLACTONE 25 MG TABS (SPIRONOLACTONE) Take 2 tablet by mouth once a day  #60 x 6   Entered and Authorized by:   Elita Quick E. Cassius Cullinane MD   Signed by:   Nolon Rod. Colie Fugitt MD on 05/14/2007   Method used:   Print then Give to Patient   RxID:   [id number]  ]  Pneumovax Vaccine    Vaccine Type: Pneumovax (Medicare)    Site: left deltoid    Dose: 0.5 ml    Route: IM    Given by: Shary Decamp  Exp. Date: 05/05/2008    Lot #: [id number]  Influenza Vaccine    Vaccine Type: Fluvax MCR    Site: right deltoid    Dose: 0.5 ml    Route: IM    Given by: Shary Decamp    Exp. Date: 12/28/2007    Lot #: [id number]  Flu Vaccine Consent Questions    Do you have a history of severe allergic reactions to this vaccine? no    Any prior history of allergic reactions to egg and/or gelatin? no    Do you have a sensitivity to the preservative Thimersol? no    Do you have a past history of Guillan-Barre Syndrome? no    Do you currently have an acute febrile illness? no    Have you ever had a severe reaction to latex? no    Vaccine information given and explained to patient? yes    Are you currently pregnant? no

## 2010-07-30 NOTE — Assessment & Plan Note (Signed)
Summary: 3 MONTH FOLLOWUP////SPH   Vital Signs:  Patient profile:   70 year old female Weight:      176 pounds BMI:     34.50 Temp:     97.0 degrees F oral Pulse rate:   60 / minute Pulse rhythm:   regular Resp:     12 per minute BP sitting:   120 / 60  (left arm) Cuff size:   large  Vitals Entered By: Mervin Kung CMA (September 17, 2009 8:05 AM) CC: room 17   3 month follow up  Is Patient Diabetic? No   CC:  room 17   3 month follow up .  History of Present Illness: Ms Riesen is a 70 year old female who presents today for follow up.  Since her last visit she has seen orthopedics and started doing exercises of her left shoulder,  using aleve as needed.  Tells me that shoulder pain has improved.    HTN-tells me that she follows a low sodium diet and has started walking again.  Anxiety- tells me that this is currently well controlled.  She is no longer using diazepam. Does note that she has significant stress caring for her husband who is undergoing cancer treatments and her mother who is blind.    Preventive Screening-Counseling & Management  Alcohol-Tobacco     Smoking Status: never  Allergies: 1)  ! Pcn 2)  ! Ceremenux 3)  ! Doxycycline 4)  ! Zithromax 5)  ! * Climara Patch 6)  ! * Bee Stings  Review of Systems       Denies swelling in her ankles. Denies headache  Physical Exam  General:  Well-developed,well-nourished,in no acute distress; alert,appropriate and cooperative throughout examination Lungs:  Normal respiratory effort, chest expands symmetrically. Lungs are clear to auscultation, no crackles or wheezes. Heart:  Normal rate and regular rhythm. S1 and S2 normal without gallop, murmur, click, rub or other extra sounds.   Impression & Recommendations:  Problem # 1:  HYPERTENSION (ICD-401.9) Assessment Improved BP stable, will check BMET. Her updated medication list for this problem includes:    Spironolactone 25 Mg Tabs (Spironolactone) .Marland Kitchen... 1 by  mouth once daily    Carvedilol 6.25 Mg Tabs (Carvedilol) .Marland Kitchen... 1 by mouth two times a day  Orders: Venipuncture (16109) TLB-BMP (Basic Metabolic Panel-BMET) (80048-METABOL)  BP today: 120/60 Prior BP: 136/80 (06/19/2009)  Labs Reviewed: K+: 4.4 (04/10/2009) Creat: : 1.0 (04/10/2009)   Chol: 183 (04/10/2009)   HDL: 44.40 (04/10/2009)   LDL: 115 (04/10/2009)   TG: 120.0 (04/10/2009)  Problem # 2:  SHOULDER STRAIN, LEFT (ICD-840.9) Assessment: Improved Follow up with orthopedics, continue physical therapy, continue aleve as needed   Problem # 3:  ANXIETY (ICD-300.00) Assessment: Improved Stable per patient continue to monitor.   The following medications were removed from the medication list:    Diazepam 5 Mg Tabs (Diazepam) .Marland Kitchen... Pt holding right now "trying not to use"  Complete Medication List: 1)  Spironolactone 25 Mg Tabs (Spironolactone) .Marland Kitchen.. 1 by mouth once daily 2)  Carvedilol 6.25 Mg Tabs (Carvedilol) .Marland Kitchen.. 1 by mouth two times a day 3)  Calcium/vitamin D  4)  Mvi  5)  Adult Aspirin Ec Low Strength 81 Mg Tbec (Aspirin) 6)  Otc Allergy  7)  Glucosamine  8)  Pataday Gtts  9)  Aleve 220 Mg Tabs (Naproxen sodium) .... As needed for pain  Patient Instructions: 1)  Please schedule a follow-up appointment in 3 months. 2)  Keep  up the good work with the low sodium diet and exercise! 3)  Have a nice spring!  Current Allergies: ! PCN ! CEREMENUX ! DOXYCYCLINE ! ZITHROMAX ! [facility] PATCH ! * BEE STINGS

## 2010-07-30 NOTE — Assessment & Plan Note (Signed)
Summary: cpx & lab.cbs   Vital Signs:  Patient Profile:   70 Years Old Female Height:     60 inches Weight:      174.4 pounds Pulse rate:   66 / minute BP sitting:   140 / 88  Vitals Entered By: Shary Decamp (January 19, 2008 8:30 AM)                 Chief Complaint:  yearly - fasting.  History of Present Illness: yearly HA-- still there, no intense  HTN--amb BPs 130ss/80s  did a "Life Line" recently U/S results pending TC 181, HDL 52, LDL 108, CBG 104    Updated Prior Medication List: SPIRONOLACTONE 25 MG TABS (SPIRONOLACTONE) 1 by mouth once daily ESTRADIOL 0.5 MG  TABS (ESTRADIOL) 1 by mouth qd ADULT ASPIRIN EC LOW STRENGTH 81 MG  TBEC (ASPIRIN)  * REMIFEMIN  DIAZEPAM 5 MG  TABS (DIAZEPAM) 1 by mouth q12 hours HYDROCODONE-ACETAMINOPHEN 5-325 MG  TABS (HYDROCODONE-ACETAMINOPHEN) 1 by mouth q4 hours as needed pain IBUPROFEN 800 MG  TABS (IBUPROFEN) 1 by mouth q8h as needed pain * CALCIUM/VITAMIN D  * MVI   Current Allergies (reviewed today): ! PCN ! CEREMENUX ! DOXYCYCLINE ! ZITHROMAX ! * CLIMARA PATCH ! * BEE STINGS  Past Medical History:    Reviewed history from 08/23/2007 and no changes required:       Hypertension (09/29/2006)       Cholelithiasis       Headache       Anxiety  Past Surgical History:    Reviewed history from 10/20/2006 and no changes required:       Appendectomy       Hysterectomy, no oophorectomy       Tonsillectomy   Family History:    Reviewed history from 10/20/2006 and no changes required:       Family History of CAD Female 1st degree relative <50       Family History Diabetes 1st degree relative       colon ca--no       breast ca--no  Social History:    Reviewed history from 10/20/2006 and no changes required:       Never Smoked       Alcohol use-no       Married       Retired, was a Education officer, environmental        husband has prostate ca, trying to stay strong       takes care of mom, 84y/o   Risk Factors: Tobacco use:   never Alcohol use:  no  Colonoscopy History:    Date of Last Colonoscopy:  10/28/2005   Review of Systems  CV      Denies chest pain or discomfort and swelling of feet.  Resp      Denies shortness of breath.      still occ cough, dry  GI      Denies bloody stools, diarrhea, and nausea.  GU      sees gyn  Psych      husband has prostate ca, trying to stay strong takes care of mom, 84y/o sleeps well mostly   Physical Exam  General:     alert and well-developed.   Neck:     no thyromegaly.   Lungs:     normal respiratory effort, no intercostal retractions, no accessory muscle use, and normal breath sounds.   Heart:     normal rate, regular rhythm, and no murmur.  Abdomen:     soft, non-tender, no hepatomegaly, and no splenomegaly.  no bruit Extremities:     no pretibial edema bilaterally  Neurologic:     alert & oriented X3, strength normal in all extremities, and gait normal.   Psych:     Oriented X3, memory intact for recent and remote, normally interactive, good eye contact, not anxious appearing, and not depressed appearing.      Impression & Recommendations:  Problem # 1:  HYPERTENSION (ICD-401.9) self decrease spironolactone, 2 tabs was "too strong" add coreg, low dose diet-exercise encouraged EKG--at baseline Her updated medication list for this problem includes:    Spironolactone 25 Mg Tabs (Spironolactone) .Marland Kitchen... 1 by mouth once daily    Carvedilol 6.25 Mg Tabs (Carvedilol) .Marland Kitchen... 1 by mouth two times a day  BP today: 140/88 Prior BP: 172/100 (08/23/2007)  Labs Reviewed: Creat: 0.9 (10/12/2006) Chol: 178 (10/12/2006)   HDL: 48.4 (10/12/2006)   LDL: 110 (10/12/2006)   TG: 99 (10/12/2006)  Orders: TLB-BMP (Basic Metabolic Panel-BMET) (80048-METABOL) TLB-CBC Platelet - w/Differential (85025-CBCD) Venipuncture (60454) TLB-Hepatic/Liver Function Pnl (80076-HEPATIC) EKG w/ Interpretation (93000)   Problem # 2:  HEALTH SCREENING  (ICD-V70.0) sees gyn recent FLP good, see HPI Cscope 10-2005 (-) Td 2006 pneumonia shot 11-08 DEXA 5-09: neg had shingles shot already Orders: T-Vitamin D (25-Hydroxy) (09811-91478)   Problem # 3:  ANXIETY (ICD-300.00) see SH counseled Her updated medication list for this problem includes:    Diazepam 5 Mg Tabs (Diazepam) .Marland Kitchen... 1 every 12 hours prn  Orders: TLB-TSH (Thyroid Stimulating Hormone) (84443-TSH)   Problem # 4:  HEADACHE (ICD-784.0) offered referal since Sx are no better--will do pt thinks related to some stress Her updated medication list for this problem includes:    Adult Aspirin Ec Low Strength 81 Mg Tbec (Aspirin)    Hydrocodone-acetaminophen 5-325 Mg Tabs (Hydrocodone-acetaminophen) .Marland Kitchen... 1 by mouth q4 hours as needed pain    Ibuprofen 800 Mg Tabs (Ibuprofen) .Marland Kitchen... 1 by mouth q8h as needed pain    Carvedilol 6.25 Mg Tabs (Carvedilol) .Marland Kitchen... 1 by mouth two times a day  Orders: Neurology Referral (Neuro)   Complete Medication List: 1)  Spironolactone 25 Mg Tabs (Spironolactone) .Marland Kitchen.. 1 by mouth once daily 2)  Estradiol 0.5 Mg Tabs (Estradiol) .Marland Kitchen.. 1 by mouth qd 3)  Adult Aspirin Ec Low Strength 81 Mg Tbec (Aspirin) 4)  Remifemin  5)  Diazepam 5 Mg Tabs (Diazepam) .Marland Kitchen.. 1 every 12 hours prn 6)  Hydrocodone-acetaminophen 5-325 Mg Tabs (Hydrocodone-acetaminophen) .Marland Kitchen.. 1 by mouth q4 hours as needed pain 7)  Ibuprofen 800 Mg Tabs (Ibuprofen) .Marland Kitchen.. 1 by mouth q8h as needed pain 8)  Calcium/vitamin D  9)  Mvi  10)  Carvedilol 6.25 Mg Tabs (Carvedilol) .Marland Kitchen.. 1 by mouth two times a day   Patient Instructions: 1)  Please schedule a follow-up appointment in 6 months. 2)  Call if BP > 140/85   Prescriptions: DIAZEPAM 5 MG  TABS (DIAZEPAM) 1 every 12 hours prn  #60 x 3   Entered by:   Shary Decamp   Authorized by:   Nolon Rod. Nyshaun Standage MD   Signed by:   Shary Decamp on 01/19/2008   Method used:   [facility]ed to ...       [facility] [address] [phone]*       [address]       [place], [place]  [address]  Ph: [phone] or [phone]       Fax: [phone]   RxID:   [id number] CARVEDILOL 6.25 MG  TABS (CARVEDILOL) 1 by mouth two times a day  #60 x 6   Entered and Authorized by:   Nolon Rod. Alba Perillo MD   Signed by:   Nolon Rod. Trevonn Hallum MD on 01/19/2008   Method used:   Print then Give to Patient   RxID:   [id number]  ]

## 2010-07-30 NOTE — Assessment & Plan Note (Signed)
Summary: 2 week fu/kdc   Vital Signs:  Patient profile:   70 year old female Height:      60 inches Weight:      170.4 pounds BMI:     33.40 Pulse rate:   82 / minute BP sitting:   110 / 70  Vitals Entered By: Shary Decamp (October 26, 2009 10:39 AM) CC: rov, feeling better   History of Present Illness: follow-up from the last office visit  Current Medications (verified): 1)  Spironolactone 25 Mg Tabs (Spironolactone) .Marland Kitchen.. 1 By Mouth Once Daily 2)  Carvedilol 6.25 Mg  Tabs (Carvedilol) .Marland Kitchen.. 1 By Mouth Two Times A Day 3)  Calcium/vitamin D 4)  Mvi 5)  Adult Aspirin Ec Low Strength 81 Mg  Tbec (Aspirin) 6)  Otc Allergy 7)  Glucosamine 8)  Pataday Gtts 9)  Aleve 220 Mg Tabs (Naproxen Sodium) .... As Needed For Pain 10)  Dicyclomine Hcl 10 Mg Caps (Dicyclomine Hcl) .Marland Kitchen.. 1 By Mouth Every 4 Hours As Needed For Cramps30  Allergies (verified): 1)  ! Pcn 2)  ! Ceremenux 3)  ! Doxycycline 4)  ! Zithromax 5)  ! * Climara Patch 6)  ! * Bee Stings  Past History:  Past Medical History: Reviewed history from 10/08/2009 and no changes required. Hypertension (09/29/2006) Cholelithiasis Headache Anxiety  Past Surgical History: Reviewed history from 01/19/2008 and no changes required. Appendectomy Hysterectomy, no oophorectomy Tonsillectomy  Review of Systems       since her last office visit, the lower abd.  cramps are almost resolved no further nausea or vomiting no diarrhea appetite is only "okay", she's also trying to eat less with the recent diagnosis of diabetes  Physical Exam  General:  alert and well-developed.   Abdomen:  soft, no distention, no masses, no guarding, no rigidity, and no rebound tenderness. very  mild discomfort to palpation   Impression & Recommendations:  Problem # 1:  ABDOMINAL PAIN, LOWER (ICD-789.09) nearly resolved, still has very mild discomfort in the lower abdomen to palpation. Plan: Observation, patient will call if symptoms  resurface Her updated medication list for this problem includes:    Adult Aspirin Ec Low Strength 81 Mg Tbec (Aspirin)    Aleve 220 Mg Tabs (Naproxen sodium) .Marland Kitchen... As needed for pain  Problem # 2:  HYPERGLYCEMIA, MILD (ICD-790.29) will return to the office in  3 months  Problem # 3:  platelets slightly low, see last CBC recheck on return to the office  Complete Medication List: 1)  Spironolactone 25 Mg Tabs (Spironolactone) .Marland Kitchen.. 1 by mouth once daily 2)  Carvedilol 6.25 Mg Tabs (Carvedilol) .Marland Kitchen.. 1 by mouth two times a day 3)  Calcium/vitamin D  4)  Mvi  5)  Adult Aspirin Ec Low Strength 81 Mg Tbec (Aspirin) 6)  Otc Allergy  7)  Glucosamine  8)  Pataday Gtts  9)  Aleve 220 Mg Tabs (Naproxen sodium) .... As needed for pain 10)  Dicyclomine Hcl 10 Mg Caps (Dicyclomine hcl) .Marland Kitchen.. 1 by mouth every 4 hours as needed for cramps30  Patient Instructions: 1)  Please schedule a follow-up appointment in 3 months .
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
## 2010-07-30 NOTE — Assessment & Plan Note (Signed)
Summary: ro4--4 weeks ov//ph   Vital Signs:  Patient Profile:   70 Years Old Female Height:     60 inches Weight:      177.4 pounds Pulse rate:   72 / minute Pulse rhythm:   regular BP sitting:   118 / 84  (left arm) Cuff size:   large  Vitals Entered By: Shary Decamp (July 10, 2008 11:04 AM)                 Chief Complaint:  f/u on leg pain -- still with pain rt leg is worse.  History of Present Illness: f/u on leg pain -- still with pain rt leg is worse than Left    Updated Prior Medication List: SPIRONOLACTONE 25 MG TABS (SPIRONOLACTONE) 1 by mouth once daily CARVEDILOL 6.25 MG  TABS (CARVEDILOL) 1 by mouth two times a day * REMIFEMIN  IBUPROFEN 800 MG  TABS (IBUPROFEN) 1 by mouth q8h as needed pain * CALCIUM/VITAMIN D  * MVI  ADULT ASPIRIN EC LOW STRENGTH 81 MG  TBEC (ASPIRIN)  AMITRIPTYLINE HCL 50 MG TABS (AMITRIPTYLINE HCL) 1 by mouth at bedtime (Dr Richardean Chimera)  Current Allergies (reviewed today): ! PCN ! CEREMENUX ! DOXYCYCLINE ! ZITHROMAX ! [facility] PATCH ! * BEE STINGS  Past Medical History:    Reviewed history from 01/19/2008 and no changes required:       Hypertension (09/29/2006)       Cholelithiasis       Headache       Anxiety  Past Surgical History:    Reviewed history from 01/19/2008 and no changes required:       Appendectomy       Hysterectomy, no oophorectomy       Tonsillectomy     Review of Systems       --saw neurology, for HAs. 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   Extremities:     trace  pretibial edema bilaterally  Neurologic:     alert & oriented X3, strength normal in all extremities, gait normal, and DTRs symmetrical and normal.       Impression & Recommendations:  Problem # 1:  LEG PAIN, BILATERAL (ICD-729.5) cont. w/ pain at low back and R>L LE ?neurological claudication  (symptoms increase w/  walking, vascular exam normal) check back MRI  Problem # 2:  BACK PAIN (ICD-724.5) see #1 The following medications were removed from the medication list:    Hydrocodone-acetaminophen 5-325 Mg Tabs (Hydrocodone-acetaminophen) .Marland Kitchen... 1 by mouth q4 hours as needed pain  Her updated medication list for this problem includes:    Ibuprofen 800 Mg Tabs (Ibuprofen) .Marland Kitchen... 1 by mouth q8h as needed pain    Adult Aspirin Ec Low Strength 81 Mg Tbec (Aspirin)  Orders: Radiology Referral (Radiology)   Problem # 3:  neurology reports pending (see ROS)   Complete Medication List: 1)  Spironolactone 25 Mg Tabs (Spironolactone) .Marland Kitchen.. 1 by mouth once daily 2)  Carvedilol 6.25 Mg Tabs (Carvedilol) .Marland Kitchen.. 1 by mouth two times a day 3)  Remifemin  4)  Ibuprofen 800 Mg Tabs (Ibuprofen) .Marland Kitchen.. 1 by mouth q8h as needed pain 5)  Calcium/vitamin D  6)  Mvi  7)  Adult Aspirin Ec Low Strength 81 Mg Tbec (Aspirin) 8)  Amitriptyline Hcl 50 Mg Tabs (Amitriptyline hcl) .Marland Kitchen.. 1 by mouth at bedtime (dr Richardean Chimera)   Patient Instructions: 1)  Please schedule a follow-up appointment in 3 to 4  months.

## 2010-07-30 NOTE — Progress Notes (Signed)
Summary: sick  Phone Note Call from Patient Call back at Home Phone [phone]   Caller: Patient Reason for Call: Acute Illness Summary of Call: dr. Drue Novel pt has a cold or a sinus infection. she also feels weak, some chills, and she also is having headaches. she would like to be seen today by a doctor. she is aware of dr. Drue Novel not being in the office.  Initial call taken by: Charolette Child,  June 18, 2007 1:29 PM  Follow-up for Phone Call        s/w pt who c/o cold, sinus ov sched for today 2:30  with dr Blossom Hoops t said will be there informed only adreesing the cold only...................................................................Marland KitchenKandice Hams  June 18, 2007 1:58 PM

## 2010-07-30 NOTE — Progress Notes (Signed)
Summary: calling to check on pt -  Phone Note Outgoing Call   Details for Reason: CALLING TO CHECK ON PT CT NORMAL NEEDS FOLLOW UP APPT WITH DR Hollynn Garno 4/09 Summary of Call: Left message on machine to return call. ..................................................................Marland KitchenShary Decamp  September 02, 2007 9:56 AM patient aware CT ok still with HA - no worse but no better - still with cold sxs patient is to call me back next week to let me know how she is feeling ER if worse ..................................................................Marland KitchenShary Decamp  September 02, 2007 10:58 AM     Follow-up for Phone Call        agree....................................................................Markise Haymer E. Cristina Mattern MD  September 05, 2007 12:03 PM

## 2010-07-30 NOTE — Progress Notes (Signed)
Summary: appt--LMOM 10/18  Phone Note Outgoing Call   Call placed by: Army Fossa CMA,  April 15, 2010 8:24 AM Summary of Call: Pt due for OV with Dr.Paz.   Follow-up for Phone Call        [facility] TO CALL AND SCHEDULE A REGULAR OFFICE VISIT Follow-up by: Jerolyn Shin,  April 16, 2010 2:38 PM

## 2010-07-30 NOTE — Progress Notes (Signed)
  Phone Note Outgoing Call   Call placed by: Lemont Fillers FNP,  June 05, 2009 4:05 PM Call placed to: Patient Summary of Call: Called Patient to notify her that her cxr is showing a RUL pneumonia and told patient that I will be calling a new antibiotic in to her pharmacy for her to take.  I also discussed the case with Dr. Darryll Capers in regards to antibiotic coverage given recent treatment with Levaquin and multiple drug allergies.  He recommended Vantin and a follow- up chest x-ray on friday of this week.  Follow-up for Phone Call        Please call Monique [name] and arrange a follow up appointment on friday as well as a follow up chest x-ray Follow-up by: Lemont Fillers FNP,  June 05, 2009 4:15 PM  Additional Follow-up for Phone Call Additional follow up Details #1::        scheduled pt for fri 12/10  Additional Follow-up by: Shary Decamp,  June 05, 2009 4:18 PM    New/Updated Medications: VANTIN 100 MG TABS (CEFPODOXIME PROXETIL) two tablet by mouth two times a day x 10 days Prescriptions: VANTIN 100 MG TABS (CEFPODOXIME PROXETIL) two tablet by mouth two times a day x 10 days  #40 x 0   Entered and Authorized by:   Lemont Fillers FNP   Signed by:   Lemont Fillers FNP on 06/05/2009   Method used:   [facility]        [facility] [address] [phone]* (retail)       [address]       [place], [place]  [address]       Ph: [phone] or [phone]       Fax: [phone]   RxID:   [id number]

## 2010-07-30 NOTE — Assessment & Plan Note (Signed)
Summary: FOR LEFT SIDE PAIN//PH   Vital Signs:  Patient profile:   70 year old female Height:      60 inches Weight:      171.6 pounds BMI:     33.63 O2 Sat:      93 % on Room air Temp:     97.2 degrees F Pulse rate:   93 / minute Pulse rhythm:   regular Resp:     18 per minute BP sitting:   120 / 80  (left arm) Cuff size:   large  Vitals Entered By: Arman Filter (June 05, 2009 10:54 AM)  O2 Flow:  Room air  CC:  c/o left arm and neck pain onset 1 week.  History of Present Illness: Monique [name] is a 70 year old female who presents with c/o L arm/shoulder pain.  + pain with movement.  This has been going on for 1 week.  Patient has used extra strength tylenol with minimal improvement.    Bronchitis- was treated before thanksgiving with levaquin- note that she is still is feeling better but still coughing and sneezing.  Cough is non-productive, denies fever.    Allergies: 1)  ! Pcn 2)  ! Ceremenux 3)  ! Doxycycline 4)  ! Zithromax 5)  ! * Climara Patch 6)  ! * Bee Stings  Review of Systems       denies L arm injury,  denies fever, + cough  Physical Exam  General:  Well-developed,well-nourished,in no acute distress; alert,appropriate and cooperative throughout examination Lungs:  Normal respiratory effort, chest expands symmetrically. Lungs are clear to auscultation, no crackles or wheezes. Heart:  Normal rate and regular rhythm. S1 and S2 normal without gallop, murmur, click, rub or other extra sounds. Msk:  left shoulder tender to palpation,  + pain with ROM   Impression & Recommendations:  Problem # 1:  SHOULDER STRAIN, LEFT (ICD-840.9) Will treat with mobic/ice/rest.  If no improvement in two weeks, will consider further eval by orthopedics, patient instructed to f/u in 2 weeks.  Problem # 2:  ACUTE BRONCHITIS (ICD-466.0) Assessment: Improved  Patient completed levaquin, now afebrile with dry cough, will check a cxr to r/o pneumonia.  Does note clear nasal  discharge (husband has cold)  I supect that her cough may be exacerabated by post-nasal drip Her updated medication list for this problem includes:    Mucinex 600 Mg Xr12h-tab (Guaifenesin) .Marland Kitchen... 1 tab by mouth every 12 hours    Delsym Night Time Multi-sympt 5-6.25-10-325 Mg/28ml Liqd (Phenyleph-doxylamine-dm-apap) .Marland KitchenMarland KitchenMarland KitchenMarland Kitchen 10 ml by mouth every 12 hours for cough    Levaquin 500 Mg Tabs (Levofloxacin) ..... One tablet by mouth daily x 7 days    Tessalon Perles 100 Mg Caps (Benzonatate) ..... One cap by mouth three times a day as needed cough  Orders: T-2 View CXR (71020TC)  Complete Medication List: 1)  Spironolactone 25 Mg Tabs (Spironolactone) .Marland Kitchen.. 1 by mouth once daily 2)  Carvedilol 6.25 Mg Tabs (Carvedilol) .Marland Kitchen.. 1 by mouth two times a day 3)  Calcium/vitamin D  4)  Mvi  5)  Adult Aspirin Ec Low Strength 81 Mg Tbec (Aspirin) 6)  Diazepam 5 Mg Tabs (Diazepam) .... Pt holding right now "trying not to use" 7)  Otc Allergy  8)  Glucosamine  9)  Pataday Gtts  10)  Mucinex 600 Mg Xr12h-tab (Guaifenesin) .Marland Kitchen.. 1 tab by mouth every 12 hours 11)  Delsym Night Time Multi-sympt 5-6.25-10-325 Mg/56ml Liqd (Phenyleph-doxylamine-dm-apap) .Marland Kitchen.. 10 ml by mouth  every 12 hours for cough 12)  Levaquin 500 Mg Tabs (Levofloxacin) .... One tablet by mouth daily x 7 days 13)  Tylenol Extra Strength 500 Mg Tabs (Acetaminophen) .... 2 tabs q4h by mouth for pain 14)  Mobic 7.5 Mg Tabs (Meloxicam) .... One tabet by mouth daily as needed for left shoulder pain 15)  Tessalon Perles 100 Mg Caps (Benzonatate) .... One cap by mouth three times a day as needed cough  Patient Instructions: 1)  Please try to rest your Left shoulder as much as possible.   2)  Complete your chest x-ray today. 3)  Please call if your cough persists/worsens or if you develop fever Prescriptions: MOBIC 7.5 MG TABS (MELOXICAM) one tabet by mouth daily as needed for left shoulder pain  #15 x 0   Entered and Authorized by:   Lemont Fillers FNP   Signed by:   Lemont Fillers FNP on 06/05/2009   Method used:   Electronically to        [facility] [address] [phone]* (retail)       [address]       [place], [place]  [address]       Ph: [phone] or [phone]       Fax: [phone]   RxID:   [id number]   Current Allergies (reviewed today): ! PCN ! CEREMENUX ! DOXYCYCLINE ! ZITHROMAX ! * CLIMARA PATCH ! * BEE STINGS  Appended Document: FOR LEFT SIDE PAIN//PH     Clinical Lists Changes  Medications: Rx of TESSALON PERLES 100 MG CAPS (BENZONATATE) one cap by mouth three times a day as needed cough;  #30 x 0;  Signed;  Entered by: Doristine Devoid;  Authorized by: Lemont Fillers FNP;  Method used: [facility]ically to [facility] [address] [phone]*, [address], [place], [place], [place]  [address], Ph: [phone] or [phone], Fax: [phone]    Prescriptions: TESSALON PERLES 100 MG CAPS (BENZONATATE) one cap by mouth three times a day as needed cough  #30 x 0   Entered by:   Doristine Devoid   Authorized by:   Lemont Fillers FNP   Signed by:   Doristine Devoid on 06/05/2009   Method used:   Electronically to        [facility] [address] [phone]* (retail)       [address]       [place], [place]  [address]       Ph: [phone] or [phone]       Fax: [phone]   RxID:   [id number]

## 2010-07-30 NOTE — Assessment & Plan Note (Signed)
Summary: yearly checkup/ns/kdc   Vital Signs:  Patient profile:   70 year old female Height:      60 inches Weight:      172.6 pounds Pulse rate:   70 / minute BP sitting:   120 / 80  Vitals Entered By: Shary Decamp (April 09, 2009 2:25 PM) CC: yearly  Comments  - not fasting  - no pap -- has gyn  - will schedule mmg through gyn  Flu Vaccine Consent Questions     Do you have a history of severe allergic reactions to this vaccine? no    Any prior history of allergic reactions to egg and/or gelatin? no    Do you have a sensitivity to the preservative Thimersol? no    Do you have a past history of Guillan-Barre Syndrome? no    Do you currently have an acute febrile illness? no    Have you ever had a severe reaction to latex? no    Vaccine information given and explained to patient? yes    Are you currently pregnant? no    Lot Number:[id number]   Exp Date:12/27/2009   Site Given  Left Deltoid IM  Shary Decamp  April 09, 2009 2:27 PM    History of Present Illness: yearly, chart reviewed  Hypertension-- ambulatory BPs < 130 lately  Headache-- no recent  major HAs Anxiety-- symptoms ok, takes care of several older  family members and her husband    Current Medications (verified): 1)  Spironolactone 25 Mg Tabs (Spironolactone) .Marland Kitchen.. 1 By Mouth Once Daily 2)  Carvedilol 6.25 Mg  Tabs (Carvedilol) .Marland Kitchen.. 1 By Mouth Two Times A Day 3)  Remifemin 4)  Calcium/vitamin D 5)  Mvi 6)  Adult Aspirin Ec Low Strength 81 Mg  Tbec (Aspirin) 7)  Diazepam 5 Mg Tabs (Diazepam) .... Pt Holding Right Now "trying Not To Use" 8)  Otc Allergy 9)  Glucosamine 10)  Pataday Gtts  Allergies (verified): 1)  ! Pcn 2)  ! Ceremenux 3)  ! Doxycycline 4)  ! Zithromax 5)  ! * Climara Patch 6)  ! * Bee Stings  Past History:  Past Medical History: Hypertension (09/29/2006) Cholelithiasis Headache Anxiety  Past Surgical History: Reviewed history from 01/19/2008 and no changes  required. Appendectomy Hysterectomy, no oophorectomy Tonsillectomy  Family History: Reviewed history from 01/19/2008 and no changes required. CAD--F  Diabetes --1st degree relative HTN-- M colon ca--no breast ca--no prostate ca-- GF  Social History: Reviewed history from 10/09/2008 and no changes required. 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 CV:  Denies chest pain or discomfort and swelling of feet. Resp:  Denies shortness of breath; some cough , dry. GI:  Denies bloody stools, diarrhea, nausea, and vomiting. GU:  sees a gyn . MS:  back pain improving, getting massages .  Physical Exam  General:  alert, well-developed, and overweight-appearing.   Neck:  no masses, no thyromegaly, and normal carotid upstroke.   Lungs:  normal respiratory effort, no intercostal retractions, no accessory muscle use, and normal breath sounds.   Heart:  normal rate, regular rhythm, and no murmur.   Abdomen:  soft, non-tender, no distention, no masses, no guarding, and no rigidity.   Extremities:  no pretibial  edema bilaterally  Psych:  Cognition and judgment appear intact. Alert and cooperative with normal attention span and concentration, not anxious appearing and not depressed appearing.     Impression & Recommendations:  Problem # 1:  BACK PAIN (ICD-724.5) improving Her updated medication list for this problem includes:    Adult Aspirin Ec Low Strength 81 Mg Tbec (Aspirin)  Problem # 2:  HEALTH SCREENING (ICD-V70.0) sees gyn PAP-MMG per gyn Cscope 10-2005 (-) Td 2006 pneumonia shot 11-08 DEXA 5-09: neg had shingles shot already  Problem # 3:  ANXIETY (ICD-300.00) well-controlled Her updated medication list for this problem includes:    Diazepam 5  Mg Tabs (Diazepam) .Marland Kitchen... Pt holding right now "trying not to use"  Problem # 4:  HYPERTENSION (ICD-401.9) at goal , labs  Her updated medication list for this problem includes:    Spironolactone 25 Mg Tabs (Spironolactone) .Marland Kitchen... 1 by mouth once daily    Carvedilol 6.25 Mg Tabs (Carvedilol) .Marland Kitchen... 1 by mouth two times a day  BP today: 120/80 Prior BP: 144/70 (10/09/2008)  Labs Reviewed: K+: 4.2 (06/14/2008) Creat: : 0.9 (06/14/2008)   Chol: 178 (10/12/2006)   HDL: 48.4 (10/12/2006)   LDL: 110 (10/12/2006)   TG: 99 (10/12/2006)  Complete Medication List: 1)  Spironolactone 25 Mg Tabs (Spironolactone) .Marland Kitchen.. 1 by mouth once daily 2)  Carvedilol 6.25 Mg Tabs (Carvedilol) .Marland Kitchen.. 1 by mouth two times a day 3)  Remifemin  4)  Calcium/vitamin D  5)  Mvi  6)  Adult Aspirin Ec Low Strength 81 Mg Tbec (Aspirin) 7)  Diazepam 5 Mg Tabs (Diazepam) .... Pt holding right now "trying not to use" 8)  Otc Allergy  9)  Glucosamine  10)  Pataday Gtts   Other Orders: Flu Vaccine 53yrs + (16109) Administration Flu vaccine - MCR (U0454)  Patient Instructions: 1)  please come back fasting: 2)  BMP, FLP, CBC   DX hypertension 3)  Please schedule a follow-up appointment in 6 months .  Prescriptions: CARVEDILOL 6.25 MG  TABS (CARVEDILOL) 1 by mouth two times a day  #180 x 3   Entered by:   Shary Decamp   Authorized by:   Nolon Rod. Assunta Pupo MD   Signed by:   Shary Decamp on 04/09/2009   Method used:   Print then Give to Patient   RxID:   [id number] SPIRONOLACTONE 25 MG TABS (SPIRONOLACTONE) 1 by mouth once daily  #90 x 3   Entered by:   Shary Decamp   Authorized by:   Nolon Rod. Sharin Altidor MD   Signed by:   Shary Decamp on 04/09/2009   Method used:   Print then Give to Patient   RxID:   [id number]      Preventive Care Screening  Prior Values:    Colonoscopy:  Done (10/28/2005)    Bone Density:  normal (11/08/2007)    Last Tetanus Booster:  Td (10/08/2004)    Last Flu Shot:  Fluvax MCR  (05/14/2007)    Last Pneumovax:  Pneumovax (Medicare) (05/14/2007)    Dexa Interp:  normal (11/08/2007)

## 2010-07-30 NOTE — Assessment & Plan Note (Signed)
Summary: 6 MTH FU/KDC   Vital Signs:  Patient profile:   70 year old female Height:      60 inches Weight:      174.6 pounds Temp:     97.1 degrees F BP sitting:   120 / 64  Vitals Entered By: Shary Decamp (October 08, 2009 8:56 AM) CC: rov - c/o of abd pain  Comments  - abd pain, starts on left side & radiates across abd x 3 weeks  - had pelvis u/s last week, ordered by gyn (per pt neg)  - +diarrhea, nausea Shary Decamp  October 08, 2009 9:04 AM    History of Present Illness:  complained of lower abdomen no pain, bilaterally, for the last 3 or 4 weeks the pain is on and off, crampy, does not change with eating or urination she has only taken Aleve for that. her husband had a "stomach virus" two weeks ago -->  nausea vomiting and diarrhea.  He is better now. chart reviewed    recent negative ultrasound of the pelvis Cscope 10-2005 (-)  Hypertension -- ambulatory BPs wnl      Current Medications (verified): 1)  Spironolactone 25 Mg Tabs (Spironolactone) .Marland Kitchen.. 1 By Mouth Once Daily 2)  Carvedilol 6.25 Mg  Tabs (Carvedilol) .Marland Kitchen.. 1 By Mouth Two Times A Day 3)  Calcium/vitamin D 4)  Mvi 5)  Adult Aspirin Ec Low Strength 81 Mg  Tbec (Aspirin) 6)  Otc Allergy 7)  Glucosamine 8)  Pataday Gtts 9)  Aleve 220 Mg Tabs (Naproxen Sodium) .... As Needed For Pain  Allergies (verified): 1)  ! Pcn 2)  ! Ceremenux 3)  ! Doxycycline 4)  ! Zithromax 5)  ! * Climara Patch 6)  ! * Bee Stings  Past History:  Past Medical History: Hypertension (09/29/2006) Cholelithiasis Headache Anxiety  Past Surgical History: Reviewed history from 01/19/2008 and no changes required. Appendectomy Hysterectomy, no oophorectomy Tonsillectomy  Social History: Reviewed history from 10/09/2008 and no changes required. 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   Lungs:  Normal respiratory effort, chest expands symmetrically. Lungs are clear to auscultation, no crackles or wheezes. Heart:  normal rate, regular rhythm, and no murmur.   Abdomen:  soft and no distention.  good bowel sounds, diffuse, mild abdomen and tenderness, worse on the lower abdomen without mass or rebound   Impression & Recommendations:  Problem # 1:  ABDOMINAL PAIN, LOWER (ICD-789.09)  3 to 4 weeks history of lower abdominal pain, patient is afebrile, review of systems essentially negative status-post a hysterectomy and appendectomy recent pelvic ultrasound negative urine today show microscopic hematuria patient reports a history of diverticulitis but pain is atypical for diverticulitis atypical UTI?  Plan:  CBC Urine culture, empirical  Cipro and bentyl, reassess in two weeks, ER if symptoms severe, call in few  days if no better reassessed in two weeks Her updated medication list for this problem includes:    Adult Aspirin Ec Low Strength 81 Mg Tbec (Aspirin)    Aleve 220 Mg Tabs (Naproxen  sodium) .Marland Kitchen... As needed for pain  Orders: Venipuncture (16109) TLB-CBC Platelet - w/Differential (85025-CBCD) Prescription Created Electronically [phone])  Problem # 2:  HYPERTENSION (ICD-401.9) at goal  Her updated medication list for this problem includes:    Spironolactone 25 Mg Tabs (Spironolactone) .Marland Kitchen... 1 by mouth once daily    Carvedilol 6.25 Mg Tabs (Carvedilol) .Marland Kitchen... 1 by mouth two times a day  BP today: 120/64 Prior BP: 120/60 (09/17/2009)  Labs Reviewed: K+: 4.1 (09/17/2009) Creat: : 0.9 (09/17/2009)   Chol: 183 (04/10/2009)   HDL: 44.40 (04/10/2009)   LDL: 115 (04/10/2009)   TG: 120.0  (04/10/2009)  Problem # 3:  HYPERGLYCEMIA, MILD (ICD-790.29) last hemoglobin A1c 5.9, she has borderline diabetes this diagnosis was discussed with the patient. Information provided regards diet  Complete Medication List: 1)  Spironolactone 25 Mg Tabs (Spironolactone) .Marland Kitchen.. 1 by mouth once daily 2)  Carvedilol 6.25 Mg Tabs (Carvedilol) .Marland Kitchen.. 1 by mouth two times a day 3)  Calcium/vitamin D  4)  Mvi  5)  Adult Aspirin Ec Low Strength 81 Mg Tbec (Aspirin) 6)  Otc Allergy  7)  Glucosamine  8)  Pataday Gtts  9)  Aleve 220 Mg Tabs (Naproxen sodium) .... As needed for pain 10)  Ciprofloxacin Hcl 500 Mg Tabs (Ciprofloxacin hcl) .... One by mouth twice a day 11)  Dicyclomine Hcl 10 Mg Caps (Dicyclomine hcl) .Marland Kitchen.. 1 by mouth every 4 hours as needed for cramps30  Other Orders: UA Dipstick w/o Micro (manual) (09811) Specimen Handling (99000) T-Culture, Urine ([id number]) T-Urine Microscopic (13086-57846)  Patient Instructions: 1)  drink lots of fluids 2)  ciprofloxacin for one week 3)  dicyclomine as needed for pain 4)  ER if symptoms severe, high fever 5)  call if not better in a few days 6)  stop Aleve 7)  Please schedule a follow-up appointment in 2 weeks.  Prescriptions: DICYCLOMINE HCL 10 MG CAPS (DICYCLOMINE HCL) 1 by mouth every 4 hours as needed for cramps30  #30 x 0   Entered and Authorized by:   Nolon Rod. Colette Dicamillo MD   Signed by:   Nolon Rod. Keoni Havey MD on 10/08/2009   Method used:   Electronically to        [facility] [address] [phone]* (retail)       [address]       [place], [place]  [address]       Ph: [phone] or [phone]       Fax: [phone]   RxID:   [id number] CIPROFLOXACIN HCL 500 MG TABS (CIPROFLOXACIN HCL) one by mouth twice a day  #14 x 0   Entered and Authorized by:   Nolon Rod. Kelin Borum MD   Signed by:   Nolon Rod. Daily Crate MD on 10/08/2009   Method used:   [facility] to        [facility] [address] [phone]* (retail)       [address]       [place], [place]  [address]       Ph: [phone] or [phone]       Fax: [phone]   RxID:   [id number]   Laboratory Results   Urine Tests    Routine Urinalysis   Glucose: negative   (Normal Range: Negative) Bilirubin: negative   (Normal Range: Negative) Ketone: negative   (Normal Range: Negative) Spec. Gravity: 1.010   (Normal Range: 1.003-1.035) Blood:  large   (Normal Range: Negative) pH: 6.0   (Normal Range: 5.0-8.0) Protein: negative   (Normal Range: Negative) Urobilinogen: 0.2   (Normal Range: 0-1) Nitrite: negative   (Normal Range: Negative) Leukocyte Esterace: negative   (Normal Range: Negative)

## 2010-07-30 NOTE — Assessment & Plan Note (Signed)
Summary: FOLLOW -UP//PH   Vital Signs:  Patient profile:   70 year old female Weight:      171.25 pounds Pulse rate:   97 / minute Pulse rhythm:   regular BP sitting:   124 / 84  (left arm) Cuff size:   large  Vitals Entered By: Army Fossa CMA (April 22, 2010 2:33 PM) CC: follow up not fasting  Comments [facility]   History of Present Illness: ROV feels well ambulatory BPs "good", unable to tell actual readings  Current Medications (verified): 1)  Spironolactone 25 Mg Tabs (Spironolactone) .Marland Kitchen.. 1 By Mouth Once Daily 2)  Carvedilol 6.25 Mg  Tabs (Carvedilol) .Marland Kitchen.. 1 By Mouth Two Times A Day 3)  Calcium/vitamin D 4)  Mvi 5)  Adult Aspirin Ec Low Strength 81 Mg  Tbec (Aspirin) 6)  Otc Allergy 7)  Glucosamine 8)  Aleve 220 Mg Tabs (Naproxen Sodium) .... As Needed For Pain 9)  Dicyclomine Hcl 10 Mg Caps (Dicyclomine Hcl) .Marland Kitchen.. 1 By Mouth Every 4 Hours As Needed For Cramps30 10)  Systaine 11)  Pataday 0.2 % Soln (Olopatadine Hcl)  Allergies (verified): 1)  ! Pcn 2)  ! Ceremenux 3)  ! Doxycycline 4)  ! Zithromax 5)  ! * Climara Patch 6)  ! * Bee Stings  Past History:  Past Medical History: Hypertension (09/29/2006) Cholelithiasis Headache Anxiety bone density normal 12/2009  Past Surgical History: Reviewed history from 01/19/2008 and no changes required. 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   Lungs:  Normal respiratory effort, chest expands symmetrically. Lungs are clear to auscultation, no crackles or wheezes. Heart:  normal rate, regular rhythm, and no murmur.   Extremities:  no pretibial edema bilaterally  Psych:   Oriented X3, memory intact for recent and remote, normally interactive, good eye contact, not anxious appearing, and not depressed appearing.  Seems in good spirits   Impression & Recommendations:  Problem # 1:  HYPERGLYCEMIA, MILD (ICD-790.29) labs patient aware she has borderline elevated CBGs rec diet  meaning of A1C discussed   Orders: Venipuncture (04540) TLB-A1C / Hgb A1C (Glycohemoglobin) (83036-A1C) Specimen Handling (98119)  Problem # 2:  HYPERTENSION (ICD-401.9) well controlled  labs platelets were slightly  low the last time we checked---labs   Her updated medication list for this problem includes:    Spironolactone 25 Mg Tabs (Spironolactone) .Marland Kitchen... 1 by mouth once daily    Carvedilol 6.25 Mg Tabs (Carvedilol) .Marland Kitchen... 1 by mouth two times a day  Orders: TLB-BMP (Basic Metabolic Panel-BMET) (80048-METABOL) TLB-CBC Platelet - w/Differential (85025-CBCD) Specimen Handling (14782)  Complete Medication List: 1)  Spironolactone 25 Mg Tabs (Spironolactone) .Marland Kitchen.. 1 by mouth once daily 2)  Carvedilol 6.25 Mg Tabs (Carvedilol) .Marland Kitchen.. 1 by mouth two times a day 3)  Calcium/vitamin D  4)  Mvi  5)  Adult Aspirin Ec Low Strength 81 Mg Tbec (Aspirin) 6)  Otc Allergy  7)  Glucosamine  8)  Aleve 220 Mg Tabs (Naproxen sodium) .... As needed for pain 9)  Dicyclomine Hcl 10 Mg Caps (Dicyclomine hcl) .Marland Kitchen.. 1 by mouth every 4 hours as needed for cramps30 10)  Systaine  11)  Pataday 0.2 % Soln (Olopatadine hcl)  Patient Instructions: 1)  Please schedule a follow-up appointment in 4 to 6 months , fasting, physical exam   Orders Added: 1)  Venipuncture [36415] 2)  TLB-BMP (Basic Metabolic Panel-BMET) [80048-METABOL] 3)  TLB-CBC Platelet - w/Differential [85025-CBCD] 4)  TLB-A1C / Hgb A1C (Glycohemoglobin) [83036-A1C] 5)  Specimen Handling [99000] 6)  Est. Patient Level III [16109]   Immunization History:  Influenza Immunization History:    Influenza:  [facility] @ [facility]  (04/03/2010)   Immunization History:  Influenza Immunization History:    Influenza:  got @ [facility] (04/03/2010)
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
## 2010-07-30 NOTE — Progress Notes (Signed)
Summary: mri results  Phone Note Outgoing Call Call back at [facility] Phone [phone] Call back at Work Phone [phone]   Summary of Call: MRI results: L4-L5 findings likely correlate w/ R sided symptoms: advise patient, needs ortho eval. Please arrange . Signed by Nolon Rod Paz MD on 07/17/2008 at 9:55 PM  Follow-up for Phone Call        left message on machine to return call.Marland KitchenMarland KitchenShary Decamp  July 18, 2008 1:28 PM discussed with patient .Shary Decamp  July 19, 2008 10:36 AM

## 2010-07-30 NOTE — Letter (Signed)
Summary: Screening Results Summary/[facility]  Screening Results Summary/[facility]   Imported By: Lanelle Bal 05/30/2008 10:19:45  _____________________________________________________________________  External Attachment:    Type:   Image     Comment:   External Document

## 2010-07-30 NOTE — Assessment & Plan Note (Signed)
Summary: acute only - rt leg pain./cbs   Vital Signs:  Patient Profile:   70 Years Old Female Height:     60 inches Weight:      179.8 pounds Temp:     98 degrees F BP sitting:   140 / 80  Vitals Entered By: Shary Decamp (June 14, 2008 3:19 PM)                 Chief Complaint:  rt hip pain radiates down leg; neck muscles feel tight x long time but getting worse.  History of Present Illness: 1 year h/o pain (like muscle spasms) , getting increasingly worse pain is located at B lower back and R>L legs. Pain @ the R legs encompas the whole leg symptoms worse w/ walking also has "neck pain" points to the lower aspect of the posterior neck, no radiation    Updated Prior Medication List: SPIRONOLACTONE 25 MG TABS (SPIRONOLACTONE) 1 by mouth once daily ESTRADIOL 0.5 MG  TABS (ESTRADIOL) 1 by mouth qd ADULT ASPIRIN EC LOW STRENGTH 81 MG  TBEC (ASPIRIN)  * REMIFEMIN  DIAZEPAM 5 MG  TABS (DIAZEPAM) 1 every 12 hours prn HYDROCODONE-ACETAMINOPHEN 5-325 MG  TABS (HYDROCODONE-ACETAMINOPHEN) 1 by mouth q4 hours as needed pain IBUPROFEN 800 MG  TABS (IBUPROFEN) 1 by mouth q8h as needed pain * CALCIUM/VITAMIN D  * MVI  CARVEDILOL 6.25 MG  TABS (CARVEDILOL) 1 by mouth two times a day  Current Allergies (reviewed today): ! PCN ! CEREMENUX ! DOXYCYCLINE ! ZITHROMAX ! [facility] PATCH ! * BEE STINGS  Past Medical History:    Reviewed history from 01/19/2008 and no changes required:       Hypertension (09/29/2006)       Cholelithiasis       Headache       Anxiety  Past Surgical History:    Reviewed history from 01/19/2008 and no changes required:       Appendectomy       Hysterectomy, no oophorectomy       Tonsillectomy     Review of Systems  GI      Denies abdominal pain.      no b/b incontinence   Psych      very stressed due to mom health, husband just finished XRT, etc    Physical Exam  General:     alert and well-developed.   Abdomen:     soft,  non-tender, no distention, and no masses.  No bruits  Msk:     no tender to palpation at the spine , lower back or legs  Pulses:     normal pedal and femoral  pulses bilaterally  Extremities:     trace  pretibial edema bilaterally  Neurologic:     alert & oriented X3, strength normal in all extremities, gait normal, and DTRs symmetrical and normal.   Psych:     Oriented X3, memory intact for recent and remote, normally interactive, good eye contact, not anxious appearing, and not depressed appearing.      Impression & Recommendations:  Problem # 1:  LEG PAIN, BILATERAL (ICD-729.5) back and leg pain, normal vascular exam, not on statins, only "new " medicine is carvedilol and is unlikely that symptoms are a s/e from it. recent TSH normal plan:  labs, BMP change diazepam to q 6 h. ok to use hydrocodone as needed  offered SSRI (related to stress ?)---declined  re asses in 3 to 4 weeks Orders: Venipuncture (16109) TLB-B12,  Serum-Total ONLY (27253-G64) TLB-Sedimentation Rate (ESR) (85651-ESR) TLB-CK Total Only(Creatine Kinase/CPK) (82550-CK)   Problem # 2:  HYPERTENSION (ICD-401.9) no change  Her updated medication list for this problem includes:    Spironolactone 25 Mg Tabs (Spironolactone) .Marland Kitchen... 1 by mouth once daily    Carvedilol 6.25 Mg Tabs (Carvedilol) .Marland Kitchen... 1 by mouth two times a day  Orders: TLB-BMP (Basic Metabolic Panel-BMET) (80048-METABOL)  BP today: 140/80 Prior BP: 140/88 (01/19/2008)  Labs Reviewed: Creat: 1.0 (01/19/2008) Chol: 178 (10/12/2006)   HDL: 48.4 (10/12/2006)   LDL: 110 (10/12/2006)   TG: 99 (10/12/2006)   Complete Medication List: 1)  Spironolactone 25 Mg Tabs (Spironolactone) .Marland Kitchen.. 1 by mouth once daily 2)  Carvedilol 6.25 Mg Tabs (Carvedilol) .Marland Kitchen.. 1 by mouth two times a day 3)  Estradiol 0.5 Mg Tabs (Estradiol) .Marland Kitchen.. 1 by mouth qd 4)  Remifemin  5)  Diazepam 5 Mg Tabs (Diazepam) .... 1/2 every 6 hours as needed pain 6)   Hydrocodone-acetaminophen 5-325 Mg Tabs (Hydrocodone-acetaminophen) .Marland Kitchen.. 1 by mouth q4 hours as needed pain 7)  Ibuprofen 800 Mg Tabs (Ibuprofen) .Marland Kitchen.. 1 by mouth q8h as needed pain 8)  Calcium/vitamin D  9)  Mvi  10)  Adult Aspirin Ec Low Strength 81 Mg Tbec (Aspirin)   Patient Instructions: 1)  Please schedule a follow-up appointment in 3 to 4 weeks.   Prescriptions: HYDROCODONE-ACETAMINOPHEN 5-325 MG  TABS (HYDROCODONE-ACETAMINOPHEN) 1 by mouth q4 hours as needed pain  #60 x 0   Entered and Authorized by:   Nolon Rod. Izzak Fries MD   Signed by:   Nolon Rod. Lucas Winograd MD on 06/14/2008   Method used:   Print then Give to Patient   RxID:   [id number] DIAZEPAM 5 MG  TABS (DIAZEPAM) 1/2 every 6 hours as needed pain  #60 x 0   Entered and Authorized by:   Nolon Rod. Briyanna Billingham MD   Signed by:   Nolon Rod. Tam Delisle MD on 06/14/2008   Method used:   Print then Give to Patient   RxID:   [id number]  ]

## 2010-07-30 NOTE — Assessment & Plan Note (Signed)
Summary: acute only/cold,sinus/alr   Vital Signs:  Patient Profile:   70 Years Old Female Weight:      172.13 pounds Temp:     98.5 degrees F oral Pulse rate:   88 / minute Resp:     18 per minute BP sitting:   140 / 80  (right arm)  Vitals Entered By: Ardyth Man (June 18, 2007 2:37 PM)                 Chief Complaint:  acute: cold, sinus x 2-3 days, and URI symptoms.  History of Present Illness:  URI Symptoms      This is a 70 year old woman who presents with URI symptoms.  The symptoms began duration > 3 days ago.  The patient reports nasal congestion, productive cough, and sick contacts, but denies earache.  The patient denies fever, stiff neck, dyspnea, wheezing, and rash.  The patient also reports sneezing.  Took OTC coricidin which has been helpful.         Physical Exam  General:     Well-developed,well-nourished,in no acute distress; alert,appropriate and cooperative throughout examination Ears:     External ear exam shows no significant lesions or deformities.  Otoscopic examination reveals clear canals, tympanic membranes are intact bilaterally without bulging, retraction, inflammation or discharge. Hearing is grossly normal bilaterally. Nose:     clear nasal discharge. Mildly swollen turbinates Mouth:     Oral mucosa and oropharynx without lesions or exudates.  Teeth in good repair. Lungs:     Normal respiratory effort, chest expands symmetrically. Lungs are clear to auscultation, no crackles or wheezes. Heart:     Normal rate and regular rhythm. S1 and S2 normal without gallop, murmur, click, rub or other extra sounds.    Impression & Recommendations:  Problem # 1:  URI (ICD-465.9)  Her updated medication list for this problem includes:    Adult Aspirin Ec Low Strength 81 Mg Tbec (Aspirin)    Coricidin Hbp Cold/flu 2-325 Mg Tabs (Chlorpheniramine-apap) Patient aware not to take medication that can increase her blood pressure Instructed  on symptomatic treatment. Call if symptoms persist or f/u if they worsen. Her updated medication list for this problem includes:    Adult Aspirin Ec Low Strength 81 Mg Tbec (Aspirin)    Robitussin Chest Congestion 100 Mg/76ml Syrp (Guaifenesin)    Coricidin Hbp Cold/flu 2-325 Mg Tabs (Chlorpheniramine-apap)   Problem # 2:  HYPERTENSION (ICD-401.9) Borderline, but improved Her updated medication list for this problem includes:    Spironolactone 25 Mg Tabs (Spironolactone) .Marland Kitchen... Take 2 tablet by mouth once a day  BP today: 140/80 Prior BP: 160/90 (05/14/2007)  Labs Reviewed: Creat: 0.9 (10/12/2006) Chol: 178 (10/12/2006)   HDL: 48.4 (10/12/2006)   LDL: 110 (10/12/2006)   TG: 99 (10/12/2006)   Complete Medication List: 1)  Spironolactone 25 Mg Tabs (Spironolactone) .... Take 2 tablet by mouth once a day 2)  Calcium/vitamin D  3)  Remifemin  4)  Mvi  5)  Adult Aspirin Ec Low Strength 81 Mg Tbec (Aspirin) 6)  Robitussin Chest Congestion 100 Mg/17ml Syrp (Guaifenesin) 7)  Coricidin Hbp Cold/flu 2-325 Mg Tabs (Chlorpheniramine-apap)     ]

## 2010-08-01 NOTE — Progress Notes (Signed)
Summary: FYI--pt says she is doing better  Phone Note Call from Patient Call back at Home Phone [phone]   Caller: Patient Summary of Call: Just an FYI-------Patient called to check in with Dr Meredith Staggers that she is "doing better"--from disc pinching on nerve causing neck and back pain----she is taking Physical therapy and water aerobics---just checking in---doesnt need a return phone call Initial call taken by: Jerolyn Shin,  July 12, 2010 1:54 PM  Follow-up for Phone Call        thank you Follow-up by: [facility] E. Mikella Linsley MD,  July 14, 2010 1:53 PM

## 2010-08-01 NOTE — Progress Notes (Signed)
Summary: pt status   ---- Converted from flag ---- ---- 05/30/2010 9:55 PM, Josilyn Shippee E. Adiyah Lame MD wrote: check on patient, back pain better? ------------------------------  I spoke w/  pt she states that she is still having problems but she started PT this week. Army Fossa CMA  June 13, 2010 2:11 PM

## 2010-08-21 ENCOUNTER — Encounter: Payer: Self-pay | Admitting: Internal Medicine

## 2010-08-22 ENCOUNTER — Ambulatory Visit (INDEPENDENT_AMBULATORY_CARE_PROVIDER_SITE_OTHER): Payer: Medicare Other | Admitting: Family Medicine

## 2010-08-22 ENCOUNTER — Encounter: Payer: Self-pay | Admitting: Family Medicine

## 2010-08-22 DIAGNOSIS — E119 Type 2 diabetes mellitus without complications: Secondary | ICD-10-CM

## 2010-08-27 NOTE — Assessment & Plan Note (Signed)
Summary: BLD SUGAR TOO LOW/RH....   Vital Signs:  Patient profile:   70 year old female Height:      60 inches Weight:      169 pounds Temp:     98.0 degrees F oral Pulse rate:   82 / minute BP sitting:   138 / 90  (left arm)  Vitals Entered By: Jeremy Johann CMA (August 22, 2010 9:38 AM) CC: low blood sugar, fasting   History of Present Illness: 70 yo woman here today for low blood sugar.  had life line screening done yesterday and fasting CBG was 58.  denies sxs of dizziness, weakness, confusion.  had a high sugar breakfast prior to 4 hr fast- juice, muffin, applesauce.  Current Medications (verified): 1)  Spironolactone 25 Mg Tabs (Spironolactone) .Marland Kitchen.. 1 By Mouth Once Daily 2)  Carvedilol 6.25 Mg  Tabs (Carvedilol) .Marland Kitchen.. 1 By Mouth Two Times A Day 3)  Calcium/vitamin D 4)  Mvi 5)  Adult Aspirin Ec Low Strength 81 Mg  Tbec (Aspirin) 6)  Otc Allergy 7)  Glucosamine 8)  Aleve 220 Mg Tabs (Naproxen Sodium) .... As Needed For Pain 9)  Systaine 10)  Restasis 0.05 % Emul (Cyclosporine)  Allergies (verified): 1)  ! Pcn 2)  ! Ceremenux 3)  ! Doxycycline 4)  ! Zithromax 5)  ! * Climara Patch 6)  ! * Bee Stings  Review of Systems      See HPI  Physical Exam  General:  alert and well-developed.   Neck:  No deformities, masses, or tenderness noted. Lungs:  Normal respiratory effort, chest expands symmetrically. Lungs are clear to auscultation, no crackles or wheezes. Heart:  normal rate, regular rhythm, and no murmur.   Extremities:  no lower extremity edema   Impression & Recommendations:  Problem # 1:  DM (ICD-250.00) Assessment Unchanged pt w/ asymptomatic hypoglyemic episode yesterday.  reviewed that her high sugar breakfast likely caused a spike in blood sugar followed by a 'crash'.  pt's CBG this AM 95 fasting.  pt feeling well.  feels much better w/ the reassurance provided. Her updated medication list for this problem includes:    Adult Aspirin Ec Low  Strength 81 Mg Tbec (Aspirin)  Complete Medication List: 1)  Spironolactone 25 Mg Tabs (Spironolactone) .Marland Kitchen.. 1 by mouth once daily 2)  Carvedilol 6.25 Mg Tabs (Carvedilol) .Marland Kitchen.. 1 by mouth two times a day 3)  Calcium/vitamin D  4)  Mvi  5)  Adult Aspirin Ec Low Strength 81 Mg Tbec (Aspirin) 6)  Otc Allergy  7)  Glucosamine  8)  Aleve 220 Mg Tabs (Naproxen sodium) .... As needed for pain 9)  Systaine  10)  Restasis 0.05 % Emul (Cyclosporine)  Patient Instructions: 1)  Your blood sugar today looks great! 2)  I don't think there is any reason for concern 3)  Call if you have any problems or questions 4)  Follow up as scheduled w/ Dr Drue Novel 5)  Enjoy your aerobics!!!   Orders Added: 1)  Est. Patient Level III [16109]  Appended Document: BLD SUGAR TOO LOW/RH....     Clinical Lists Changes  Orders: Added new Service order of Glucose, (CBG) (60454) - Signed Added new Service order of Fingerstick [phone]) - Signed

## 2010-09-12 LAB — HM MAMMOGRAPHY: HM Mammogram: NORMAL

## 2010-09-18 ENCOUNTER — Encounter: Payer: Self-pay | Admitting: Internal Medicine

## 2010-09-18 ENCOUNTER — Ambulatory Visit (INDEPENDENT_AMBULATORY_CARE_PROVIDER_SITE_OTHER): Payer: Medicare Other | Admitting: Internal Medicine

## 2010-09-18 DIAGNOSIS — I10 Essential (primary) hypertension: Secondary | ICD-10-CM

## 2010-09-18 DIAGNOSIS — E119 Type 2 diabetes mellitus without complications: Secondary | ICD-10-CM

## 2010-09-18 DIAGNOSIS — Z0000 Encounter for general adult medical examination without abnormal findings: Secondary | ICD-10-CM | POA: Insufficient documentation

## 2010-09-18 DIAGNOSIS — E785 Hyperlipidemia, unspecified: Secondary | ICD-10-CM

## 2010-09-18 DIAGNOSIS — M549 Dorsalgia, unspecified: Secondary | ICD-10-CM

## 2010-09-18 LAB — BASIC METABOLIC PANEL
BUN: 9 mg/dL (ref 6–23)
CO2: 29 mEq/L (ref 19–32)
Calcium: 9.2 mg/dL (ref 8.4–10.5)
Chloride: 106 mEq/L (ref 96–112)
Creatinine, Ser: 1 mg/dL (ref 0.4–1.2)
GFR: 74.75 mL/min (ref >60.00)
Glucose, Bld: 98 mg/dL (ref 70–99)
Potassium: 4.9 mEq/L (ref 3.5–5.1)
Sodium: 141 mEq/L (ref 135–145)

## 2010-09-18 LAB — LIPID PANEL
Cholesterol: 182 mg/dL (ref 0–200)
HDL: 50.9 mg/dL (ref >39.00)
LDL Cholesterol: 114 mg/dL — ABNORMAL HIGH (ref 0–99)
Total CHOL/HDL Ratio: 4
Triglycerides: 87 mg/dL (ref 0.0–149.0)
VLDL: 17.4 mg/dL (ref 0.0–40.0)

## 2010-09-18 LAB — HEMOGLOBIN A1C: Hgb A1c MFr Bld: 6 % (ref 4.6–6.5)

## 2010-09-18 LAB — TSH: TSH: 2.51 u[IU]/mL (ref 0.35–5.50)

## 2010-09-18 NOTE — Assessment & Plan Note (Signed)
F/u by ortho Rarely takes NSAIDs-hydrocodone On PT

## 2010-09-18 NOTE — Assessment & Plan Note (Signed)
Borderline Labs 

## 2010-09-18 NOTE — Progress Notes (Signed)
  Subjective:    Patient ID: Monique [name], female    DOB: June 26, 1941, 70 y.o.   MRN: [id number]  HPI  Yearly check  -------> reviewed   Hypertension ------->   BP ok @ gyn       Back pain -------> hardly takes naproxen and hydrocodone , sees ortho, doing PT Anxiety ------->  asx   Past Medical History:    Reviewed history from 08/23/2007 and no changes required:       Hypertension (09/29/2006)       Cholelithiasis       Headache       Anxiety       Back pain  Past Surgical History:    Reviewed history from 10/20/2006 and no changes required:       Appendectomy       Hysterectomy, no oophorectomy       Tonsillectomy   Family History: CAD-- F       Diabetes --uncle  Prostate ca-- F colon ca--no  breast ca--no     Review of Systems  Constitutional: Negative for fever, fatigue and unexpected weight change.  Respiratory: Negative for cough and shortness of breath.   Cardiovascular: Negative for chest pain and leg swelling.  Gastrointestinal: Negative for abdominal pain and blood in stool.       No N-V-D  Genitourinary: Negative for dysuria and hematuria.  Hematological: Negative.   Psychiatric/Behavioral: Negative for sleep disturbance. The patient is not nervous/anxious.        No depression       Objective:   Physical Exam  Constitutional: She appears well-developed. No distress.  Cardiovascular: Regular rhythm and normal heart sounds.   Pulmonary/Chest: Effort normal and breath sounds normal. No respiratory distress. She has no wheezes. She has no rales.  Abdominal: Bowel sounds are normal. She exhibits no distension. There is no tenderness. There is no rebound and no guarding.  Musculoskeletal: She exhibits no edema.  Psychiatric: She has a normal mood and affect. Her behavior is normal. Judgment and thought content normal.          Assessment & Plan:

## 2010-09-18 NOTE — Assessment & Plan Note (Signed)
Well controlled 

## 2010-09-18 NOTE — Assessment & Plan Note (Addendum)
Td 2006 pneumonia shot 11-08 Had shingles shot already Sees gyn MMG  3-11, neg . Had another recently  Cscope 10-2005 (-) @ [facility], next per gi DEXA 5-09 and 7-11 neg Counseled--diet, exercise, ca and vit D

## 2010-10-31 ENCOUNTER — Other Ambulatory Visit: Payer: Self-pay | Admitting: Internal Medicine

## 2011-01-20 ENCOUNTER — Encounter: Payer: Self-pay | Admitting: Internal Medicine

## 2011-01-20 ENCOUNTER — Ambulatory Visit (INDEPENDENT_AMBULATORY_CARE_PROVIDER_SITE_OTHER): Payer: Medicare Other | Admitting: Internal Medicine

## 2011-01-20 VITALS — BP 134/82 | HR 72 | Wt 169.2 lb

## 2011-01-20 DIAGNOSIS — H6120 Impacted cerumen, unspecified ear: Secondary | ICD-10-CM

## 2011-01-20 DIAGNOSIS — E785 Hyperlipidemia, unspecified: Secondary | ICD-10-CM

## 2011-01-20 DIAGNOSIS — E119 Type 2 diabetes mellitus without complications: Secondary | ICD-10-CM

## 2011-01-20 DIAGNOSIS — I10 Essential (primary) hypertension: Secondary | ICD-10-CM

## 2011-01-20 HISTORY — DX: Hyperlipidemia, unspecified: E78.5

## 2011-01-20 LAB — LIPID PANEL
Cholesterol: 164 mg/dL (ref 0–200)
HDL: 54.2 mg/dL (ref >39.00)
LDL Cholesterol: 96 mg/dL (ref 0–99)
Total CHOL/HDL Ratio: 3
Triglycerides: 67 mg/dL (ref 0.0–149.0)
VLDL: 13.4 mg/dL (ref 0.0–40.0)

## 2011-01-20 NOTE — Assessment & Plan Note (Signed)
Well controlled 

## 2011-01-20 NOTE — Progress Notes (Signed)
  Subjective:    Patient ID: Monique [name], female    DOB: 09-14-1940, 70 y.o.   MRN: [id number]  HPI Routine office visit, wonders if she has wax on her ears  Past Medical History  Diagnosis Date  . Hypertension 09/29/2006  . Cholelithiasis   . Headache   . Anxiety    Past Surgical History  Procedure Date  . Appendectomy   . Abdominal hysterectomy     no oophorectomy  . Tonsillectomy      Review of Systems Good medication compliance with her medicines. Ambulatory blood pressures around 120 / 80. She  has a healthy diet. She has back pain, takes pain medicine and needed. Symptoms well-controlled. She remains very active.     Objective:   Physical Exam  Constitutional: She is oriented to person, place, and time. She appears well-developed.  HENT:  Head: Normocephalic and atraumatic.       Right ear normal, left ear with abundant wax  Cardiovascular: Normal rate, regular rhythm and normal heart sounds.   No murmur heard. Pulmonary/Chest: Effort normal and breath sounds normal. No respiratory distress. She has no wheezes. She has no rales.  Musculoskeletal: She exhibits no edema.  Neurological: She is alert and oriented to person, place, and time.          Assessment & Plan:  Cerumen impactation: Left ear wax remover w/ a spoon, 90% of  wax removed, tympanic membrane normal

## 2011-01-20 NOTE — Assessment & Plan Note (Signed)
LDL has been consistently ~045, goal  < 100 Labs, she is willing to take meds if needed

## 2011-01-20 NOTE — Assessment & Plan Note (Signed)
A1c has been consistently around 6. Has a healthy lifestyle

## 2011-04-16 ENCOUNTER — Other Ambulatory Visit: Payer: Self-pay | Admitting: Internal Medicine

## 2011-04-16 NOTE — Telephone Encounter (Signed)
Done

## 2011-07-10 ENCOUNTER — Other Ambulatory Visit: Payer: Self-pay | Admitting: Internal Medicine

## 2011-07-22 ENCOUNTER — Encounter: Payer: Self-pay | Admitting: Internal Medicine

## 2011-07-22 ENCOUNTER — Ambulatory Visit (INDEPENDENT_AMBULATORY_CARE_PROVIDER_SITE_OTHER): Payer: Medicare Other | Admitting: Internal Medicine

## 2011-07-22 DIAGNOSIS — I10 Essential (primary) hypertension: Secondary | ICD-10-CM | POA: Diagnosis not present

## 2011-07-22 DIAGNOSIS — Z23 Encounter for immunization: Secondary | ICD-10-CM

## 2011-07-22 DIAGNOSIS — E119 Type 2 diabetes mellitus without complications: Secondary | ICD-10-CM | POA: Diagnosis not present

## 2011-07-22 DIAGNOSIS — E785 Hyperlipidemia, unspecified: Secondary | ICD-10-CM

## 2011-07-22 DIAGNOSIS — D696 Thrombocytopenia, unspecified: Secondary | ICD-10-CM

## 2011-07-22 NOTE — Progress Notes (Signed)
  Subjective:    Patient ID: Monique [name], female    DOB: Aug 12, 1940, 71 y.o.   MRN: [id number]  HPI ROV Feels well, good med compliance  Past Medical History: Hypertension  DJD Cholelithiasis Headache Anxiety Dry eye syndrome, sees ophtalmology  Past Surgical History: Appendectomy Hysterectomy, no oophorectomy Tonsillectomy  Social History: Married Retired, was a Education officer, environmental  Never Smoked Alcohol use-no husband has prostate ca, he is doing ok takes care of mom, 25 y/o who is blind   Review of Systems Diet is healthy amb BPs wnl  Had a flu shot Has DJD, mostly back pain, takes pain meds prn    Objective:   Physical Exam  Constitutional: She is oriented to person, place, and time. She appears well-developed and well-nourished.  HENT:  Head: Normocephalic and atraumatic.  Cardiovascular: Normal rate, regular rhythm and normal heart sounds.   No murmur heard. Pulmonary/Chest: Effort normal and breath sounds normal. No respiratory distress. She has no wheezes. She has no rales.  Musculoskeletal: She exhibits no edema.  Neurological: She is alert and oriented to person, place, and time.  Psychiatric: She has a normal mood and affect. Her behavior is normal. Judgment and thought content normal.       Assessment & Plan:

## 2011-07-22 NOTE — Assessment & Plan Note (Signed)
Due for labs

## 2011-07-22 NOTE — Assessment & Plan Note (Signed)
Well controlled per last labs , on diet only

## 2011-07-22 NOTE — Assessment & Plan Note (Signed)
Well controlled, labs  

## 2011-07-23 LAB — CBC WITH DIFFERENTIAL/PLATELET
Basophils Absolute: 0 10*3/uL (ref 0.0–0.1)
Basophils Relative: 0.9 % (ref 0.0–3.0)
Eosinophils Absolute: 0.2 10*3/uL (ref 0.0–0.7)
Eosinophils Relative: 3.4 % (ref 0.0–5.0)
HCT: 40.9 % (ref 36.0–46.0)
Hemoglobin: 13.1 g/dL (ref 12.0–15.0)
Lymphocytes Relative: 57.5 % — ABNORMAL HIGH (ref 12.0–46.0)
Lymphs Abs: 2.7 10*3/uL (ref 0.7–4.0)
MCHC: 32.1 g/dL (ref 30.0–36.0)
MCV: 73.4 fl — ABNORMAL LOW (ref 78.0–100.0)
Monocytes Absolute: 0.4 10*3/uL (ref 0.1–1.0)
Monocytes Relative: 8.3 % (ref 3.0–12.0)
Neutro Abs: 1.4 10*3/uL (ref 1.4–7.7)
Neutrophils Relative %: 29.9 % — ABNORMAL LOW (ref 43.0–77.0)
Platelets: 131 10*3/uL — ABNORMAL LOW (ref 150.0–400.0)
RBC: 5.57 Mil/uL — ABNORMAL HIGH (ref 3.87–5.11)
RDW: 15.2 % — ABNORMAL HIGH (ref 11.5–14.6)
WBC: 4.6 10*3/uL (ref 4.5–10.5)

## 2011-07-23 LAB — BASIC METABOLIC PANEL
BUN: 11 mg/dL (ref 6–23)
CO2: 29 mEq/L (ref 19–32)
Calcium: 9.8 mg/dL (ref 8.4–10.5)
Chloride: 103 mEq/L (ref 96–112)
Creatinine, Ser: 0.9 mg/dL (ref 0.4–1.2)
GFR: 80.4 mL/min (ref >60.00)
Glucose, Bld: 96 mg/dL (ref 70–99)
Potassium: 4.5 mEq/L (ref 3.5–5.1)
Sodium: 141 mEq/L (ref 135–145)

## 2011-07-23 LAB — HEMOGLOBIN A1C: Hgb A1c MFr Bld: 6 % (ref 4.6–6.5)

## 2011-07-29 NOTE — Progress Notes (Signed)
Addended by: Edwena Felty T on: 07/29/2011 10:54 AM   Modules accepted: Orders

## 2011-07-31 ENCOUNTER — Other Ambulatory Visit: Payer: Self-pay | Admitting: Internal Medicine

## 2011-07-31 NOTE — Telephone Encounter (Signed)
Refill done.  

## 2011-08-01 ENCOUNTER — Telehealth: Payer: Self-pay | Admitting: Hematology & Oncology

## 2011-08-01 NOTE — Telephone Encounter (Signed)
Pt aware of 2-27 appointment. Faxed letter to referring

## 2011-08-05 ENCOUNTER — Other Ambulatory Visit: Payer: Self-pay | Admitting: Internal Medicine

## 2011-08-05 NOTE — Telephone Encounter (Signed)
Refill done.  

## 2011-08-27 ENCOUNTER — Ambulatory Visit (HOSPITAL_BASED_OUTPATIENT_CLINIC_OR_DEPARTMENT_OTHER): Payer: Medicare Other | Admitting: Hematology & Oncology

## 2011-08-27 ENCOUNTER — Ambulatory Visit: Payer: Medicare Other

## 2011-08-27 ENCOUNTER — Encounter: Payer: Self-pay | Admitting: Hematology & Oncology

## 2011-08-27 ENCOUNTER — Other Ambulatory Visit (HOSPITAL_BASED_OUTPATIENT_CLINIC_OR_DEPARTMENT_OTHER): Payer: Medicare Other | Admitting: Lab

## 2011-08-27 VITALS — BP 117/78 | HR 70 | Temp 97.2°F | Ht 60.5 in | Wt 165.0 lb

## 2011-08-27 DIAGNOSIS — D696 Thrombocytopenia, unspecified: Secondary | ICD-10-CM | POA: Diagnosis not present

## 2011-08-27 HISTORY — DX: Thrombocytopenia, unspecified: D69.6

## 2011-08-27 LAB — CHCC SATELLITE - SMEAR

## 2011-08-27 LAB — CBC WITH DIFFERENTIAL (CANCER CENTER ONLY)
BASO#: 0 10*3/uL (ref 0.0–0.2)
BASO%: 0.5 % (ref 0.0–2.0)
EOS%: 4.9 % (ref 0.0–7.0)
Eosinophils Absolute: 0.3 10*3/uL (ref 0.0–0.5)
HCT: 41.2 % (ref 34.8–46.6)
HGB: 13.8 g/dL (ref 11.6–15.9)
LYMPH#: 1.9 10*3/uL (ref 0.9–3.3)
LYMPH%: 30.1 % (ref 14.0–48.0)
MCH: 23.5 pg — ABNORMAL LOW (ref 26.0–34.0)
MCHC: 33.5 g/dL (ref 32.0–36.0)
MCV: 70 fL — ABNORMAL LOW (ref 81–101)
MONO#: 0.5 10*3/uL (ref 0.1–0.9)
MONO%: 7.6 % (ref 0.0–13.0)
NEUT#: 3.6 10*3/uL (ref 1.5–6.5)
NEUT%: 56.9 % (ref 39.6–80.0)
Platelets: 156 10*3/uL (ref 145–400)
RBC: 5.86 10*6/uL — ABNORMAL HIGH (ref 3.70–5.32)
RDW: 14.9 % (ref 11.1–15.7)
WBC: 6.3 10*3/uL (ref 3.9–10.0)

## 2011-08-27 LAB — TECHNOLOGIST REVIEW CHCC SATELLITE

## 2011-08-27 NOTE — Progress Notes (Signed)
This office note has been dictated.

## 2011-08-28 NOTE — Progress Notes (Signed)
CC:   Monique Ora, MD  DIAGNOSIS:  Transient thrombocytopenia.  HISTORY OF PRESENT ILLNESS:  Monique [name] is a very charming 71 year old African American female.  She is followed by Dr. Drue Novel.  Dr. Drue Novel noted that she had some mild thrombocytopenia recently.  She has been asymptomatic.  Dr. Drue Novel felt that a hematologic evaluation was indicated. As such, he kindly referred her to the [facility] for an evaluation.  Again, she has not had any bleeding or bruising.  There has been no change in medications.  She is not a vegetarian.  She has had no headache.  There has been no leg swelling.  She has not noticed any rashes.  She does not have any history of blood transfusions.  She has had no obvious exposures to HIV or hepatitis.  Going through the medical record, blood work done back in 2009 showed a platelet count of 182,000.  Back in October 2011, platelet count was 156, her white cell count was 5.1, hemoglobin was 13.  Most recently, in January, a CBC was done which showed a white cell count of 4.6, hemoglobin 13.1, hematocrit 40.9, and platelet count 131.  Her MCV was 73.  She has had relatively normal electrolytes.  She has not noted any problems with fever.  There has been no nausea, vomiting.  She has not noticed any change in bowel or bladder habits.  She does get mammograms routinely.  PAST MEDICAL HISTORY:  Remarkable for: 1. Hypertension. 2. History of anxiety. 3. Degenerative joint disease. 4. Chololithiasis.  ALLERGIES: 1. Penicillin. 2. Erythromycin. 3. Doxycycline.  MEDICATIONS:  Aspirin 81 mg p.o. daily, Coreg 6.25 mg p.o. daily, Restasis eyedrops 1 drop both eyes daily, Naprosyn 220 mg p.o. daily p.r.n., Aldactone 25 mg p.o. daily, Norco (5/325) as needed.  SOCIAL HISTORY:  Negative for tobacco use.  There is no alcohol use. She used to, I think, be in [employer].  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  FAMILY HISTORY:  Negative for sickle cell or other anemia problems.  Her father had "bone cancer."  REVIEW OF SYSTEMS:  As stated in the history of present illness.  No additional findings are noted.  PHYSICAL EXAMINATION:  General:  This is a well-developed, well- nourished black female in no obvious distress.  Vital Signs:  Show a temperature of 97.2, pulse 70, respiratory rate 16, blood pressure 117/78.  Weight is 165.  Head and Neck Exam:  Shows a normocephalic, atraumatic skull.  There are no ocular or oral lesions.  There are no palpable cervical or supraclavicular lymph nodes.  Lungs:  Clear bilaterally.  Cardiac Exam:  Regular rate and rhythm with normal S1 and S2.  There are no murmurs, rubs or bruits.  Abdominal Exam:  Soft with good bowel sounds.  She is mildly obese.  She has no fluid wave.  There is no guarding or rebound tenderness.  There is no palpable hepatosplenomegaly.  Back Exam:  No tenderness over the spine, ribs or hips.  Extremities:  Show no clubbing, cyanosis, or edema.  She has good range of motion of her joints.  Skin Exam:  No rashes, ecchymosis or petechiae.  Neurological Exam:  No focal neurological deficits.  LABORATORY STUDIES:  White cell count is 6.3, hemoglobin 13.8, hematocrit 41.2, platelet count 156.  MCV is 70.  Peripheral smear shows a normochromic, normocytic population of red blood cells.  There may be some slight increase in microcytic red cells. There are a couple target cells.  I saw no rouleaux formation.  There are no nucleated red blood cells.  There are no schistocytes.  White cells appear normal in morphology and maturation.  There is no immature myeloid or lymphoid forms.  I see no atypical lymphocytes.  There are no blasts.  Platelets are adequate number and size.  She has several large platelets.  Platelets are well granulated.  IMPRESSION:  Monique [name] is a 71 year old African American  female with transient thrombocytopenia.  She currently is with a normal platelet count.  It is hard to say why platelets fluctuate.  It could be medication related.  I certainly cannot find anything on physical exam that would suggest underlying hematologic issue.  Her blood smear looked very reassuring to me.  I do not believe that we need to get Monique [name] back to the clinic.  I think that her platelet count is going to fluctuate, but I do not see that it will cause her any issues.  I do not see a need for any change in her medications.  She can stay on aspirin.  I had a long talk with Monique [name].  We had a good fellowship.  We shared a scripture.  We will be more than happy to see Monique [name] back if she does have any problems or if there are any other issues that we could attend to.  I spent a good hour with Monique [name] today.    ______________________________ Josph Macho, M.D. PRE/MEDQ  D:  08/27/2011  T:  08/28/2011  Job:  [id number]

## 2011-09-08 DIAGNOSIS — H10439 Chronic follicular conjunctivitis, unspecified eye: Secondary | ICD-10-CM | POA: Diagnosis not present

## 2011-09-18 DIAGNOSIS — Z1212 Encounter for screening for malignant neoplasm of rectum: Secondary | ICD-10-CM | POA: Diagnosis not present

## 2011-09-18 DIAGNOSIS — N951 Menopausal and female climacteric states: Secondary | ICD-10-CM | POA: Diagnosis not present

## 2011-09-18 DIAGNOSIS — Z1231 Encounter for screening mammogram for malignant neoplasm of breast: Secondary | ICD-10-CM | POA: Diagnosis not present

## 2011-09-18 DIAGNOSIS — M810 Age-related osteoporosis without current pathological fracture: Secondary | ICD-10-CM | POA: Diagnosis not present

## 2011-11-05 ENCOUNTER — Other Ambulatory Visit: Payer: Self-pay | Admitting: Internal Medicine

## 2011-11-05 NOTE — Telephone Encounter (Signed)
Refill done.  

## 2011-11-19 ENCOUNTER — Encounter: Payer: Medicare Other | Admitting: Internal Medicine

## 2011-11-19 DIAGNOSIS — H04129 Dry eye syndrome of unspecified lacrimal gland: Secondary | ICD-10-CM | POA: Diagnosis not present

## 2011-11-20 ENCOUNTER — Ambulatory Visit (INDEPENDENT_AMBULATORY_CARE_PROVIDER_SITE_OTHER): Payer: Medicare Other | Admitting: Internal Medicine

## 2011-11-20 ENCOUNTER — Encounter: Payer: Self-pay | Admitting: Internal Medicine

## 2011-11-20 VITALS — BP 122/84 | HR 72 | Temp 98.1°F | Ht 60.75 in | Wt 167.0 lb

## 2011-11-20 DIAGNOSIS — Z0000 Encounter for general adult medical examination without abnormal findings: Secondary | ICD-10-CM

## 2011-11-20 DIAGNOSIS — IMO0001 Reserved for inherently not codable concepts without codable children: Secondary | ICD-10-CM | POA: Diagnosis not present

## 2011-11-20 DIAGNOSIS — M549 Dorsalgia, unspecified: Secondary | ICD-10-CM

## 2011-11-20 DIAGNOSIS — E785 Hyperlipidemia, unspecified: Secondary | ICD-10-CM

## 2011-11-20 DIAGNOSIS — E119 Type 2 diabetes mellitus without complications: Secondary | ICD-10-CM

## 2011-11-20 DIAGNOSIS — D696 Thrombocytopenia, unspecified: Secondary | ICD-10-CM | POA: Diagnosis not present

## 2011-11-20 DIAGNOSIS — I10 Essential (primary) hypertension: Secondary | ICD-10-CM

## 2011-11-20 LAB — HEMOGLOBIN A1C: Hgb A1c MFr Bld: 6 % (ref 4.6–6.5)

## 2011-11-20 LAB — LIPID PANEL
Cholesterol: 163 mg/dL (ref 0–200)
HDL: 48.4 mg/dL (ref >39.00)
LDL Cholesterol: 95 mg/dL (ref 0–99)
Total CHOL/HDL Ratio: 3
Triglycerides: 97 mg/dL (ref 0.0–149.0)
VLDL: 19.4 mg/dL (ref 0.0–40.0)

## 2011-11-20 LAB — BASIC METABOLIC PANEL
BUN: 16 mg/dL (ref 6–23)
CO2: 26 mEq/L (ref 19–32)
Calcium: 9.5 mg/dL (ref 8.4–10.5)
Chloride: 112 mEq/L (ref 96–112)
Creatinine, Ser: 1 mg/dL (ref 0.4–1.2)
GFR: 71.04 mL/min (ref >60.00)
Glucose, Bld: 100 mg/dL — ABNORMAL HIGH (ref 70–99)
Potassium: 4.5 mEq/L (ref 3.5–5.1)
Sodium: 147 mEq/L — ABNORMAL HIGH (ref 135–145)

## 2011-11-20 MED ORDER — CARVEDILOL 6.25 MG PO TABS: 6.2500 mg | ORAL_TABLET | Freq: Two times a day (BID) | ORAL | Status: DC

## 2011-11-20 NOTE — Assessment & Plan Note (Addendum)
Status post hematology evaluation 08-2011, was rec to  return to hematology prn. Will monitor CBCs from time to time.

## 2011-11-20 NOTE — Progress Notes (Signed)
  Subjective:    Patient ID: Monique [name], female    DOB: 1940-10-29, 71 y.o.   MRN: [id number]  HPI Here for Medicare AWV: 1. Risk factors based on Past M, S, F history: reviewed 2. Physical Activities: water aerobics, active   3. Depression/mood:  No problemss noted or reported  4. Hearing:  No problemss noted or reported  5. ADL's:  Totally independent  6. Fall Risk: no recent falls, prevention discussed  7. home Safety: does feel safe at home  8. Height, weight, &visual acuity: see VS, uses glasses, sees eye doctor routinely  9. Counseling: provided 10. Labs ordered based on risk factors: if needed  11. Referral Coordination: if needed 12.  Care Plan, see assessment and plan  13.   Cognitive Assessment: Motor skills and cognition within normal  In addition, today we discussed the following: Hypertension, good medication compliance, ambulatory BPs within normal. Has a history of dry eyes, she just saw ophthalmology. DJD, symptoms are currently mild, does not need hydrocodone, usually takes Tylenol or Advil as needed.  Past Medical History: Hypertension   DJD Cholelithiasis Headache Anxiety Dry eye syndrome, sees ophtalmology Thrombocytopenia, hematology evaluation 08-2011, return prn   Past Surgical History: Appendectomy Hysterectomy, no oophorectomy Tonsillectomy  Social History: Married, retired Education officer, environmental   Never Smoked Alcohol use-no husband has prostate ca, he is doing ok takes care of mom, 56 y/o who is blind , and an uncle  Diet--  Healthy   Family History: CAD--F  Diabetes --1st degree relative glian barre-- father  HTN-- M colon ca--no breast ca--no prostate ca-- GF  Review of Systems  Respiratory: Negative for cough, shortness of breath and wheezing.   Cardiovascular: Negative for chest pain and leg swelling.  Gastrointestinal: Negative for abdominal pain and blood in stool.  Genitourinary: Negative for dysuria and difficulty urinating.         Objective:   Physical Exam General -- alert, well-developed, and slightly overweight-appearing.   Neck --no thyromegaly , normal carotid pulse Lungs -- normal respiratory effort, no intercostal retractions, no accessory muscle use, and normal breath sounds.   Heart-- normal rate, regular rhythm, no murmur, and no gallop.   Abdomen--soft, non-tender, no distention, no masses, no HSM, no guarding, and no rigidity.   Extremities-- no pretibial edema bilaterally Neurologic-- alert & oriented X3 and strength normal in all extremities. Psych-- Cognition and judgment appear intact. Alert and cooperative with normal attention span and concentration.  not anxious appearing and not depressed appearing.       Assessment & Plan:

## 2011-11-20 NOTE — Assessment & Plan Note (Signed)
Td 2006 pneumonia shot 11-08 zostavax 2007 Sees gyn, reports a recent MMG  Cscope 10-2005 (-) @ [facility], next per gi DEXA 5-09 and 7-11 neg Counseled--diet, exercise, ca and vit D

## 2011-11-20 NOTE — Assessment & Plan Note (Signed)
Diet control only, labs 

## 2011-11-20 NOTE — Assessment & Plan Note (Signed)
Well-controlled, good medication compliance, check a BMP and cholesterol panel

## 2011-11-20 NOTE — Assessment & Plan Note (Signed)
Currently pain well-controlled with Tylenol and Advil as needed.

## 2011-11-20 NOTE — Assessment & Plan Note (Signed)
Labs

## 2011-12-01 ENCOUNTER — Ambulatory Visit (INDEPENDENT_AMBULATORY_CARE_PROVIDER_SITE_OTHER): Payer: Medicare Other | Admitting: Family

## 2011-12-01 ENCOUNTER — Encounter: Payer: Self-pay | Admitting: Family

## 2011-12-01 VITALS — BP 132/78 | HR 77 | Temp 98.1°F | Resp 18 | Wt 164.1 lb

## 2011-12-01 DIAGNOSIS — J309 Allergic rhinitis, unspecified: Secondary | ICD-10-CM | POA: Diagnosis not present

## 2011-12-01 MED ORDER — FLUTICASONE PROPIONATE 50 MCG/ACT NA SUSP: 2.0000 | Freq: Every day | NASAL | Status: DC

## 2011-12-01 NOTE — Patient Instructions (Signed)
Please call if symptoms worsen, or if no improvement in 2-3 days. You may use delsym as needed for cough.

## 2011-12-01 NOTE — Progress Notes (Signed)
Subjective:    Patient ID: Monique [name], female    DOB: 1941-04-12, 71 y.o.   MRN: [id number]  HPI  Monique [name] is a 71 yr old female who presents today with chief complaint of cough. She reports that her symptoms started 3 days ago.  She reports some clear nasal congestion.  Has been using a neti pot with some some improvement. Denies associated fever.  She denies ear pain or sore throat.  Cough generally is dry.  Has used ginger and lemon with some improvement in her symptoms.  She has also used vicks vapo rub.   Eyes have been tearing.  Some itching of her eyes.  She has been seen opthalmology.   Review of Systems See HPI  Past Medical History  Diagnosis Date  . Hypertension 09/29/2006  . Cholelithiasis   . Headache   . Anxiety   . Thrombocytopenia 08/27/2011    History   Social History  . Marital Status: Married    Spouse Name: N/A    Number of Children: N/A  . Years of Education: N/A   Occupational History  . retired     was a Education officer, environmental   Social History Main Topics  . Smoking status: Never Smoker   . Smokeless tobacco: Not on file  . Alcohol Use: No  . Drug Use: Not on file  . Sexually Active: Not on file   Other Topics Concern  . Not on file   Social History Narrative   Exercise- 5x/weekly    Past Surgical History  Procedure Date  . Appendectomy   . Abdominal hysterectomy     no oophorectomy  . Tonsillectomy     Family History  Problem Relation Age of Onset  . Coronary artery disease Father   . Diabetes      1st degree relative  . Hypertension Mother   . Prostate cancer      grandfather    Allergies  Allergen Reactions  . Bee Venom Anaphylaxis  . Azithromycin   . Doxycycline   . Penicillins   . Cerumenex (Trolamine) Rash  . Climara (Estradiol) Itching and Rash    Current Outpatient Prescriptions on File Prior to Visit  Medication Sig Dispense Refill  . aspirin 81 MG tablet Take 81 mg by mouth daily.        . Calcium-Vitamin D (CALCIUM + D  PO) Take by mouth daily.        . carvedilol (COREG) 6.25 MG tablet Take 1 tablet (6.25 mg total) by mouth 2 (two) times daily with a meal.  180 tablet  3  . cetirizine (ZYRTEC ALLERGY) 10 MG tablet Take 10 mg by mouth daily.        . cycloSPORINE (RESTASIS) 0.05 % ophthalmic emulsion Place 1 drop into both eyes daily.      Marland Kitchen glucosamine-chondroitin 500-400 MG tablet Take 1 tablet by mouth 3 (three) times daily.        . multivitamin (THERAGRAN) per tablet Take 1 tablet by mouth daily.        Bertram Gala Glycol-Propyl Glycol (SYSTANE ULTRA) 0.4-0.3 % SOLN Apply 1 drop to eye daily.      Bertram Gala Glycol-Propyl Glycol (SYSTANE) 0.4-0.3 % SOLN Apply to eye.        . spironolactone (ALDACTONE) 25 MG tablet TAKE 1 TABLET BY MOUTH EVERY DAY  90 tablet  1  . cyclobenzaprine (FLEXERIL) 10 MG tablet 1 po twice a day as needed for pain. Will cause drowsiness.       Marland Kitchen  fluticasone (FLONASE) 50 MCG/ACT nasal spray Place 2 sprays into the nose daily.  16 g  2  . naproxen sodium (ANAPROX) 220 MG tablet Take 220 mg by mouth as needed.          BP 132/78  Pulse 77  Temp(Src) 98.1 F (36.7 C) (Oral)  Resp 18  Wt 164 lb 1.3 oz (74.426 kg)  SpO2 99%       Objective:   Physical Exam  Constitutional: She appears well-developed and well-nourished. No distress.  HENT:  Head: Normocephalic and atraumatic.  Right Ear: Tympanic membrane and ear canal normal.  Left Ear: Tympanic membrane and ear canal normal.  Mouth/Throat: No posterior oropharyngeal edema or posterior oropharyngeal erythema.       Mild voice hoarseness noted.  Cardiovascular: Normal rate and regular rhythm.   Pulmonary/Chest: Effort normal and breath sounds normal.  Lymphadenopathy:    She has no cervical adenopathy.  Psychiatric: She has a normal mood and affect. Her behavior is normal. Judgment and thought content normal.          Assessment & Plan:

## 2011-12-01 NOTE — Assessment & Plan Note (Signed)
Suspect that cough is related to post nasal drip.  Recommended that she continue neti-pot, add flonase, resume zyrtec. Pt to call if symptoms worsen, or if no improvement with these changes.  Pt verbalizes understanding.

## 2011-12-10 ENCOUNTER — Telehealth: Payer: Self-pay | Admitting: *Deleted

## 2011-12-10 MED ORDER — LEVOFLOXACIN 500 MG PO TABS: 500.0000 mg | ORAL_TABLET | Freq: Every day | ORAL | Status: AC

## 2011-12-10 NOTE — Telephone Encounter (Signed)
Pt seen on 12/01/11 and states she continues to have cough (sometimes productive with yellow phlegm). Has post nasal drip and occasional scratchy throat with hoarseness. Still has watery eye. Pt is requesting rx to help clear her symptoms. Please advise.

## 2011-12-10 NOTE — Telephone Encounter (Signed)
Received message from pt that she is "still having the same problem" and to please call her. Attempted to reach pt and left message on home # to return my call.

## 2011-12-10 NOTE — Telephone Encounter (Signed)
Will rx with levaquin.  Pt should start tonight, continue zyrtec and flonase. If symptoms worsen or no improvement, she should be re-evaluated in the office by Dr. Drue Novel before the weekend.

## 2011-12-10 NOTE — Telephone Encounter (Signed)
Notified pt and she voices understanding. 

## 2011-12-12 ENCOUNTER — Telehealth: Payer: Self-pay | Admitting: Internal Medicine

## 2011-12-12 NOTE — Telephone Encounter (Signed)
Caller: Lilliam/Patient; PCP: Willow Ora; CB#: [phone];  Call regarding being seen in office on 12/01/11 and started on Flonase. Called for continued cough on 12/10/11 and put on Levofloxacin. Cough is better but still has occcassional cough with slightly yellowish mucos. Nauseous this morning after taking antibiotic- Advised to take with food. Sister in law had TB in 1970 and whole family tested afterwards and all came back negative. Triage and Care advice per Cough Protocol and appnt advised within 3 days for "Cough and history of exposure to TB". She will call on 12/15/11 if sx not improved.

## 2011-12-12 NOTE — Telephone Encounter (Signed)
Agree 

## 2011-12-24 ENCOUNTER — Emergency Department (HOSPITAL_COMMUNITY): Payer: Medicare Other

## 2011-12-24 ENCOUNTER — Encounter (HOSPITAL_COMMUNITY): Payer: Self-pay

## 2011-12-24 ENCOUNTER — Emergency Department (HOSPITAL_COMMUNITY)
Admission: EM | Admit: 2011-12-24 | Discharge: 2011-12-25 | Disposition: A | Discharge status: Home / Self Care | Payer: Medicare Other | Attending: Emergency Medicine | Admitting: Emergency Medicine

## 2011-12-24 DIAGNOSIS — R112 Nausea with vomiting, unspecified: Secondary | ICD-10-CM | POA: Diagnosis not present

## 2011-12-24 DIAGNOSIS — R42 Dizziness and giddiness: Secondary | ICD-10-CM | POA: Insufficient documentation

## 2011-12-24 DIAGNOSIS — I10 Essential (primary) hypertension: Secondary | ICD-10-CM | POA: Diagnosis not present

## 2011-12-24 DIAGNOSIS — R404 Transient alteration of awareness: Secondary | ICD-10-CM | POA: Diagnosis not present

## 2011-12-24 DIAGNOSIS — J9819 Other pulmonary collapse: Secondary | ICD-10-CM | POA: Diagnosis not present

## 2011-12-24 DIAGNOSIS — J329 Chronic sinusitis, unspecified: Secondary | ICD-10-CM | POA: Diagnosis not present

## 2011-12-24 DIAGNOSIS — J40 Bronchitis, not specified as acute or chronic: Secondary | ICD-10-CM | POA: Insufficient documentation

## 2011-12-24 DIAGNOSIS — J209 Acute bronchitis, unspecified: Secondary | ICD-10-CM | POA: Diagnosis not present

## 2011-12-24 LAB — CBC WITH DIFFERENTIAL/PLATELET
Basophils Absolute: 0.1 10*3/uL (ref 0.0–0.1)
Basophils Relative: 1 % (ref 0–1)
Eosinophils Absolute: 0.4 10*3/uL (ref 0.0–0.7)
Eosinophils Relative: 4 % (ref 0–5)
HCT: 40.8 % (ref 36.0–46.0)
Hemoglobin: 13.3 g/dL (ref 12.0–15.0)
Lymphocytes Relative: 20 % (ref 12–46)
Lymphs Abs: 1.9 10*3/uL (ref 0.7–4.0)
MCH: 23 pg — ABNORMAL LOW (ref 26.0–34.0)
MCHC: 32.6 g/dL (ref 30.0–36.0)
MCV: 70.5 fL — ABNORMAL LOW (ref 78.0–100.0)
Monocytes Absolute: 0.3 10*3/uL (ref 0.1–1.0)
Monocytes Relative: 4 % (ref 3–12)
Neutro Abs: 6.9 10*3/uL (ref 1.7–7.7)
Neutrophils Relative %: 73 % (ref 43–77)
Platelets: 152 10*3/uL (ref 150–400)
RBC: 5.79 MIL/uL — ABNORMAL HIGH (ref 3.87–5.11)
RDW: 14.7 % (ref 11.5–15.5)
WBC: 9.5 10*3/uL (ref 4.0–10.5)

## 2011-12-24 LAB — BASIC METABOLIC PANEL
BUN: 10 mg/dL (ref 6–23)
CO2: 28 mEq/L (ref 19–32)
Calcium: 10.4 mg/dL (ref 8.4–10.5)
Chloride: 103 mEq/L (ref 96–112)
Creatinine, Ser: 0.96 mg/dL (ref 0.50–1.10)
GFR calc Af Amer: 67 mL/min — ABNORMAL LOW (ref >90)
GFR calc non Af Amer: 58 mL/min — ABNORMAL LOW (ref >90)
Glucose, Bld: 99 mg/dL (ref 70–99)
Potassium: 4 mEq/L (ref 3.5–5.1)
Sodium: 140 mEq/L (ref 135–145)

## 2011-12-24 MED ORDER — ONDANSETRON HCL 4 MG/2ML IJ SOLN
4.0000 mg | Freq: Once | INTRAMUSCULAR | Status: AC
  Administered 2011-12-24: 4 mg via INTRAVENOUS
  Filled 2011-12-24: qty 2

## 2011-12-24 MED ORDER — IPRATROPIUM BROMIDE 0.02 % IN SOLN
0.5000 mg | Freq: Once | RESPIRATORY_TRACT | Status: AC
  Administered 2011-12-24: 0.5 mg via RESPIRATORY_TRACT
  Filled 2011-12-24: qty 2.5

## 2011-12-24 MED ORDER — MECLIZINE HCL 25 MG PO TABS
25.0000 mg | ORAL_TABLET | Freq: Once | ORAL | Status: AC
  Administered 2011-12-24: 25 mg via ORAL
  Filled 2011-12-24: qty 1

## 2011-12-24 MED ORDER — SODIUM CHLORIDE 0.9 % IV SOLN
Freq: Once | INTRAVENOUS | Status: AC
  Administered 2011-12-24: 22:00:00 via INTRAVENOUS

## 2011-12-24 MED ORDER — ALBUTEROL SULFATE (5 MG/ML) 0.5% IN NEBU
2.5000 mg | INHALATION_SOLUTION | Freq: Once | RESPIRATORY_TRACT | Status: AC
  Administered 2011-12-24: 2.5 mg via RESPIRATORY_TRACT
  Filled 2011-12-24: qty 20

## 2011-12-24 MED ORDER — LORAZEPAM 2 MG/ML IJ SOLN: 1.0000 mg | Freq: Once | INTRAMUSCULAR | Status: DC

## 2011-12-24 NOTE — ED Notes (Addendum)
Pt. Reports feeling dizzy and weak at approx 6pm. N/V x 1 episode after eating. Reports feeling warm prior to being dizzy. States "I normally don't go that long without eating. I just had some crackers while we were out before I tried to eat at 6pm".  Reports a hxt of diabetes, controlled with diet and exercise. Denies abdominal pain. Non tender on palpation. Denies SOB.  A.O. X 4.

## 2011-12-24 NOTE — ED Provider Notes (Signed)
History     CSN: [id number]  Arrival date & time 12/24/11  1846   First MD Initiated Contact with Patient 12/24/11 2020      Chief Complaint  Patient presents with  . Dizziness  . Nausea  . Vomiting    (Consider location/radiation/quality/duration/timing/severity/associated sxs/prior treatment) The history is provided by the patient.   71 year old female had onset at about 6 PM of severe dizziness with associated nausea and vomiting. She denies any headache or ear pain or tinnitus. She has felt off balance the dizziness is worse with any movement it is better she'll still. She said that feels as if she is going to pass out. She's also been having a cough with last 3 weeks the cough is nonproductive. She's tried a variety of over-the-counter medications with no relief. She denies fever, chills, sweats. She denies chest pain. She denies dyspnea.   Past Medical History  Diagnosis Date  . Hypertension 09/29/2006  . Cholelithiasis   . Headache   . Anxiety   . Thrombocytopenia 08/27/2011    Past Surgical History  Procedure Date  . Appendectomy   . Abdominal hysterectomy     no oophorectomy  . Tonsillectomy     Family History  Problem Relation Age of Onset  . Coronary artery disease Father   . Diabetes      1st degree relative  . Hypertension Mother   . Prostate cancer      grandfather    History  Substance Use Topics  . Smoking status: Never Smoker   . Smokeless tobacco: Not on file  . Alcohol Use: No    OB History    Grav Para Term Preterm Abortions TAB SAB Ect Mult Living                  Review of Systems  All other systems reviewed and are negative.    Allergies  Bee venom; Doxycycline; Penicillins; Azithromycin; Cerumenex; and Climara  Home Medications   Current Outpatient Rx  Name Route Sig Dispense Refill  . ASPIRIN 81 MG PO TABS Oral Take 81 mg by mouth daily with breakfast.     . CALCIUM + D PO Oral Take 2 tablets by mouth daily with lunch.       Marland Kitchen CARVEDILOL 6.25 MG PO TABS Oral Take 6.25 mg by mouth 2 (two) times daily with a meal.    . CETIRIZINE HCL 10 MG PO TABS Oral Take 10 mg by mouth 3 (three) times daily.     . CORICIDIN HBP COUGH/COLD PO Oral Take 10 mLs by mouth every 4 (four) hours as needed. For cough    . CYCLOBENZAPRINE HCL 10 MG PO TABS Oral Take 10 mg by mouth 2 (two) times daily as needed. For muscle spasms    . CYCLOSPORINE 0.05 % OP EMUL Both Eyes Place 1 drop into both eyes every 12 (twelve) hours.     Marland Kitchen FLUTICASONE PROPIONATE 50 MCG/ACT NA SUSP Nasal Place 2 sprays into the nose daily.    Marland Kitchen GLUCOSAMINE-CHONDROITIN 500-400 MG PO TABS Oral Take 1 tablet by mouth 3 (three) times daily.      . ADULT MULTIVITAMIN W/MINERALS CH Oral Take 1 tablet by mouth daily with breakfast.    . POLYETHYL GLYCOL-PROPYL GLYCOL 0.4-0.3 % OP SOLN Both Eyes Place 1 drop into both eyes 3 (three) times daily.     Marland Kitchen SPIRONOLACTONE 25 MG PO TABS Oral Take 25 mg by mouth every morning.  BP 150/72  Pulse 80  Temp 98.6 F (37 C) (Oral)  SpO2 96%  Physical Exam  Nursing note and vitals reviewed.  71 year old female who is resting comfortably in no acute distress. Vital signs show mild hypertension with blood pressure 150/72. Oxygen saturation is 96% which is normal. Is no stock and atraumatic. PERRLA, EOMI. No nystagmus is seen. TMs are clear the oropharynx is clear. Neck is nontender and supple without adenopathy, JVD, or bruit. Dizziness is reproduced by head movement. Back is nontender. Lungs are clear without rales, wheezes, rhonchi. However, there is a prolonged exhalation phase. Heart is regular rate and rhythm without murmur. Abdomen soft, flat, nontender without masses or hepatosplenomegaly. Extremities have no cyanosis or edema, full range of motion is present. Skin is warm and dry without rash. Neurologic: Mental status is normal, cranial nerves are intact, there are no motor or sensory deficits.   ED Course  Procedures  (including critical care time)  Results for orders placed during the hospital encounter of 12/24/11  CBC WITH DIFFERENTIAL      Component Value Range   WBC 9.5  4.0 - 10.5 K/uL   RBC 5.79 (*) 3.87 - 5.11 MIL/uL   Hemoglobin 13.3  12.0 - 15.0 g/dL   HCT 62.9  52.8 - 41.3 %   MCV 70.5 (*) 78.0 - 100.0 fL   MCH 23.0 (*) 26.0 - 34.0 pg   MCHC 32.6  30.0 - 36.0 g/dL   RDW 24.4  01.0 - 27.2 %   Platelets 152  150 - 400 K/uL   Neutrophils Relative 73  43 - 77 %   Neutro Abs 6.9  1.7 - 7.7 K/uL   Lymphocytes Relative 20  12 - 46 %   Lymphs Abs 1.9  0.7 - 4.0 K/uL   Monocytes Relative 4  3 - 12 %   Monocytes Absolute 0.3  0.1 - 1.0 K/uL   Eosinophils Relative 4  0 - 5 %   Eosinophils Absolute 0.4  0.0 - 0.7 K/uL   Basophils Relative 1  0 - 1 %   Basophils Absolute 0.1  0.0 - 0.1 K/uL  BASIC METABOLIC PANEL      Component Value Range   Sodium 140  135 - 145 mEq/L   Potassium 4.0  3.5 - 5.1 mEq/L   Chloride 103  96 - 112 mEq/L   CO2 28  19 - 32 mEq/L   Glucose, Bld 99  70 - 99 mg/dL   BUN 10  6 - 23 mg/dL   Creatinine, Ser 5.36  0.50 - 1.10 mg/dL   Calcium 64.4  8.4 - 03.4 mg/dL   GFR calc non Af Amer 58 (*) >90 mL/min   GFR calc Af Amer 67 (*) >90 mL/min   Dg Chest 2 View  12/24/2011  *RADIOLOGY REPORT*  Clinical Data: Dizziness  CHEST - 2 VIEW  Comparison: 06/19/2009  Findings: Low volumes.  Bibasilar atelectasis.  Mild cardiomegaly. No pleural effusion.  No pneumothorax.  IMPRESSION: Bibasilar atelectasis.  Original Report Authenticated By: Donavan Burnet, M.D.      Date: 12/24/2011  Rate: 74  Rhythm: normal sinus rhythm  QRS Axis: normal  Intervals: normal  ST/T Wave abnormalities: normal  Conduction Disutrbances:none  Narrative Interpretation: Low voltage, otherwise normal ECG. No prior ECG available for comparison.  Old EKG Reviewed: none available    1. Vertigo   2. Bronchitis       MDM  Dizziness with nausea and vomiting most  consistent with peripheral  vertigo. Cough is most likely bronchitis. She will be given a therapeutic trial of meclizine after of nausea has been relieved with ondansetron. She will also be given a therapeutic trial of albuterol with Atrovent and chest x-ray will be obtained.  Cough is significantly improved after albuterol with Atrovent. However, dizziness is not improved after meclizine. Nausea was improved with ondansetron. She will be moved to the CDU and observe. MRI will be obtained in the morning if she does not show improvement. Case is endorsed to Dr. Norlene Campbell.      Dione Booze, MD 12/25/11 [phone]

## 2011-12-24 NOTE — ED Notes (Signed)
Per EMS: Dizzy while running errands today. Thought she was hungry, so she stopped at [employer]. N/V. CBG 95. Hxt of DM, managed with diet/ exercise.BP 172/92. 100% on 2L [place].

## 2011-12-25 ENCOUNTER — Emergency Department (HOSPITAL_COMMUNITY): Payer: Medicare Other

## 2011-12-25 DIAGNOSIS — R42 Dizziness and giddiness: Secondary | ICD-10-CM | POA: Diagnosis not present

## 2011-12-25 DIAGNOSIS — R112 Nausea with vomiting, unspecified: Secondary | ICD-10-CM | POA: Diagnosis not present

## 2011-12-25 MED ORDER — MECLIZINE HCL 50 MG PO TABS: 50.0000 mg | ORAL_TABLET | Freq: Three times a day (TID) | ORAL | Status: DC | PRN

## 2011-12-25 MED ORDER — ALBUTEROL SULFATE HFA 108 (90 BASE) MCG/ACT IN AERS: 1.0000 | INHALATION_SPRAY | Freq: Four times a day (QID) | RESPIRATORY_TRACT | Status: DC | PRN

## 2011-12-25 MED ORDER — MECLIZINE HCL 50 MG PO TABS: 25.0000 mg | ORAL_TABLET | Freq: Three times a day (TID) | ORAL | Status: AC | PRN

## 2011-12-25 MED ORDER — MECLIZINE HCL 50 MG PO TABS: 25.0000 mg | ORAL_TABLET | Freq: Three times a day (TID) | ORAL | Status: DC | PRN

## 2011-12-25 NOTE — ED Provider Notes (Signed)
Medical screening examination/treatment/procedure(s) were performed by non-physician practitioner and as supervising physician I was immediately available for consultation/collaboration.   Glynn Octave, MD 12/25/11 1408

## 2011-12-25 NOTE — ED Notes (Signed)
Patient transported to MRI 

## 2011-12-25 NOTE — ED Provider Notes (Signed)
7:00 AM Assumed care of patient in the CDU.   She is to have a MRI brain this morning to rule out a posterior stroke.  Yesterday evening patient presented to the ED with vertigo that did not improve with Meclizine.    7:38 AM Reassessed patient.  She reports that she is not having any symptoms at this time.  Alert and orientated x 3, Heart RRR, Lungs CTAB, CN II-XII grossly intact, no nystagmus, grip strength 5/5 bilaterally, finger to nose testing intact, lower extremity muscle strength 5/5 bilaterally.  8:00 AM No evidence of CVA on MRI.  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  Discussed results of the MRI with patient.  Patient will be discharged home.  Patient instructed to follow up with PCP.  Patient in agreement with the plan.   Pascal Lux [place], PA-C 12/25/11 1121

## 2011-12-25 NOTE — ED Notes (Signed)
Pt eating breakfast, called her husband to come pick her up. Will discharge her home after eating breakfast.

## 2011-12-26 ENCOUNTER — Other Ambulatory Visit: Payer: Medicare Other

## 2011-12-26 ENCOUNTER — Ambulatory Visit (INDEPENDENT_AMBULATORY_CARE_PROVIDER_SITE_OTHER): Payer: Medicare Other | Admitting: Internal Medicine

## 2011-12-26 ENCOUNTER — Encounter: Payer: Self-pay | Admitting: Internal Medicine

## 2011-12-26 VITALS — BP 132/80 | HR 80 | Temp 98.3°F | Wt 163.8 lb

## 2011-12-26 DIAGNOSIS — R05 Cough: Secondary | ICD-10-CM

## 2011-12-26 DIAGNOSIS — R42 Dizziness and giddiness: Secondary | ICD-10-CM

## 2011-12-26 DIAGNOSIS — R059 Cough, unspecified: Secondary | ICD-10-CM

## 2011-12-26 NOTE — Patient Instructions (Addendum)
Rest, take lots of fluids. Call anytime if you have severe dizziness or if you are not completely well in few days. Use the inhaler only if you feel chest congestion or severe cough.

## 2011-12-26 NOTE — Progress Notes (Signed)
  Subjective:    Patient ID: Monique [name], female    DOB: 1940/10/05, 71 y.o.   MRN: [id number]  HPI  Monique [name] to the ER 12/24/2011. Complain of dizziness, nausea, vomiting. Also cough thought to be from bronchitis, no wheezing but the expiratory time was increased, was given bronchodilators . Received Antivert and Zofran. CBC and BMP essentially normal. MRI of the brain showed-----Low lying cerebellar tonsil on the right. Otherwise normal exam. Mild chronic sinusitis.  She was kept in observation, when she left she was improved and neuro  physical exam was nonfocal  Past Medical History: Hypertension   DJD Cholelithiasis Headache Anxiety Dry eye syndrome, sees ophtalmology Thrombocytopenia, hematology evaluation 08-2011, return prn   Past Surgical History: Appendectomy Hysterectomy, no oophorectomy Tonsillectomy  Social History: Married, retired Education officer, environmental   Never Smoked Alcohol use-no husband has prostate ca, he is doing ok takes care of mom, 52 y/o who is blind , and an uncle  Diet--  Healthy   Family History: CAD--F   Diabetes --1st degree relative glian barre-- father   HTN-- M colon ca--no breast ca--no prostate ca-- GF  Review of Systems Since she left the emergency room she feels better. Dizziness has decreased. Denies slurred speech, face numbness, focal weakness  before or after the ER evaluation. Denies any chest pain or shortness of breath. No palpitations. Admits to mild headaches, on and off. Cough is essentially gone. Denies wheezing per se.     Objective:   Physical Exam General -- alert, well-developed. No apparent distress.  Lungs -- normal respiratory effort, no intercostal retractions, no accessory muscle use, and normal breath sounds.   Heart-- normal rate, regular rhythm, no murmur, and no gallop.   Extremities-- no pretibial edema bilaterally  Neurologic-- alert & oriented X3 , speech normal, face symmetric, DTRs and strength normal. EOMI, pupils  equal and reactive. Psych-- Cognition and judgment appear intact. Alert and cooperative with normal attention span and concentration.  not anxious appearing and not depressed appearing.       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 Plan: Rest, fluids, observation.  Cough, improved. At the ER she was felt to have a prolonged expiratory time and prescribe bronchodilators. Currently she is essentially asymptomatic. Recommend to use inhalers only if persisting cough or wheezing per se.  Today , I spent more than 28 min with the patient, >50% of the time counseling, and /or reviewing the chart and labs ordered by other providers

## 2012-01-05 DIAGNOSIS — E119 Type 2 diabetes mellitus without complications: Secondary | ICD-10-CM | POA: Diagnosis not present

## 2012-01-05 LAB — HM DIABETES EYE EXAM: HM Diabetic Eye Exam: NEGATIVE

## 2012-01-15 ENCOUNTER — Encounter: Payer: Self-pay | Admitting: *Deleted

## 2012-03-22 DIAGNOSIS — Z23 Encounter for immunization: Secondary | ICD-10-CM | POA: Diagnosis not present

## 2012-04-21 ENCOUNTER — Other Ambulatory Visit: Payer: Self-pay | Admitting: Internal Medicine

## 2012-04-21 NOTE — Telephone Encounter (Signed)
Refill done.  

## 2012-05-24 ENCOUNTER — Encounter: Payer: Self-pay | Admitting: Internal Medicine

## 2012-05-24 ENCOUNTER — Ambulatory Visit (INDEPENDENT_AMBULATORY_CARE_PROVIDER_SITE_OTHER): Payer: Medicare Other | Admitting: Internal Medicine

## 2012-05-24 VITALS — BP 140/80 | HR 65 | Temp 97.6°F | Wt 166.0 lb

## 2012-05-24 DIAGNOSIS — E119 Type 2 diabetes mellitus without complications: Secondary | ICD-10-CM | POA: Diagnosis not present

## 2012-05-24 DIAGNOSIS — F411 Generalized anxiety disorder: Secondary | ICD-10-CM | POA: Diagnosis not present

## 2012-05-24 DIAGNOSIS — I10 Essential (primary) hypertension: Secondary | ICD-10-CM | POA: Diagnosis not present

## 2012-05-24 MED ORDER — SPIRONOLACTONE 25 MG PO TABS: 25.0000 mg | ORAL_TABLET | Freq: Every morning | ORAL | Status: DC

## 2012-05-24 MED ORDER — CARVEDILOL 6.25 MG PO TABS: 6.2500 mg | ORAL_TABLET | Freq: Two times a day (BID) | ORAL | Status: DC

## 2012-05-24 NOTE — Assessment & Plan Note (Signed)
2 family members ill, see H&P, patient is counseled, fortunately she is doing well

## 2012-05-24 NOTE — Assessment & Plan Note (Signed)
Well-controlled, no change, refill medicines

## 2012-05-24 NOTE — Progress Notes (Signed)
  Subjective:    Patient ID: Monique [name], female    DOB: 04-19-1941, 71 y.o.   MRN: [id number]  HPI Routine visit Hypertension, good medication compliance, normal ambulatory BPs. Husband has  some health issues, worried about him. Her father is also ill.  Past Medical History: Hypertension   DJD Cholelithiasis Headache Anxiety Dry eye syndrome, sees ophtalmology Thrombocytopenia, hematology evaluation 08-2011, return prn   Past Surgical History: Appendectomy Hysterectomy, no oophorectomy Tonsillectomy  Social History: Married, retired Education officer, environmental   Never Smoked Alcohol use-no husband has prostate ca and other health issues takes care of mom, 76 y/o who is blind , and an uncle    Family History: CAD--F   Diabetes --1st degree relative glian barre-- father   HTN-- M colon ca--no breast ca--no prostate ca-- GF   Review of Systems No chest pain or shortness of breath No nausea, vomiting, diarrhea 2 family members ill, fortunately she keeps in good spirits.    Objective:   Physical Exam General -- alert, well-developed Lungs -- normal respiratory effort, no intercostal retractions, no accessory muscle use, and normal breath sounds.   Heart-- normal rate, regular rhythm, no murmur, and no gallop.   Extremities-- no pretibial edema bilaterally  Psych-- Cognition and judgment appear intact. Alert and cooperative with normal attention span and concentration.  not anxious appearing and not depressed appearing.      Assessment & Plan:

## 2012-05-24 NOTE — Assessment & Plan Note (Signed)
On diet only, A1c stable over time. Recheck on return to the office

## 2012-08-03 ENCOUNTER — Other Ambulatory Visit: Payer: Self-pay | Admitting: Internal Medicine

## 2012-08-05 NOTE — Telephone Encounter (Signed)
Spoke to [employer] at pharmacy. She stated the pt still has refills left on file. Refill request sent in error.

## 2012-09-22 ENCOUNTER — Emergency Department (HOSPITAL_COMMUNITY)
Admission: EM | Admit: 2012-09-22 | Discharge: 2012-09-22 | Disposition: A | Discharge status: Home / Self Care | Payer: Medicare Other | Attending: Emergency Medicine | Admitting: Emergency Medicine

## 2012-09-22 DIAGNOSIS — Z862 Personal history of diseases of the blood and blood-forming organs and certain disorders involving the immune mechanism: Secondary | ICD-10-CM | POA: Insufficient documentation

## 2012-09-22 DIAGNOSIS — Z7982 Long term (current) use of aspirin: Secondary | ICD-10-CM | POA: Diagnosis not present

## 2012-09-22 DIAGNOSIS — I10 Essential (primary) hypertension: Secondary | ICD-10-CM | POA: Diagnosis not present

## 2012-09-22 DIAGNOSIS — R6883 Chills (without fever): Secondary | ICD-10-CM | POA: Diagnosis not present

## 2012-09-22 DIAGNOSIS — F411 Generalized anxiety disorder: Secondary | ICD-10-CM | POA: Diagnosis not present

## 2012-09-22 DIAGNOSIS — R112 Nausea with vomiting, unspecified: Secondary | ICD-10-CM | POA: Insufficient documentation

## 2012-09-22 DIAGNOSIS — Z79899 Other long term (current) drug therapy: Secondary | ICD-10-CM | POA: Diagnosis not present

## 2012-09-22 LAB — COMPREHENSIVE METABOLIC PANEL
ALT: 26 U/L (ref 0–35)
AST: 32 U/L (ref 0–37)
Albumin: 4.2 g/dL (ref 3.5–5.2)
Alkaline Phosphatase: 81 U/L (ref 39–117)
BUN: 15 mg/dL (ref 6–23)
CO2: 25 mEq/L (ref 19–32)
Calcium: 10.2 mg/dL (ref 8.4–10.5)
Chloride: 103 mEq/L (ref 96–112)
Creatinine, Ser: 0.92 mg/dL (ref 0.50–1.10)
GFR calc Af Amer: 70 mL/min — ABNORMAL LOW (ref >90)
GFR calc non Af Amer: 61 mL/min — ABNORMAL LOW (ref >90)
Glucose, Bld: 102 mg/dL — ABNORMAL HIGH (ref 70–99)
Potassium: 4.2 mEq/L (ref 3.5–5.1)
Sodium: 141 mEq/L (ref 135–145)
Total Bilirubin: 0.5 mg/dL (ref 0.3–1.2)
Total Protein: 7.2 g/dL (ref 6.0–8.3)

## 2012-09-22 LAB — URINALYSIS, ROUTINE W REFLEX MICROSCOPIC
Bilirubin Urine: NEGATIVE
Glucose, UA: NEGATIVE mg/dL
Ketones, ur: NEGATIVE mg/dL
Leukocytes, UA: NEGATIVE
Nitrite: NEGATIVE
Protein, ur: NEGATIVE mg/dL
Specific Gravity, Urine: 1.022 (ref 1.005–1.030)
Urobilinogen, UA: 0.2 mg/dL (ref 0.0–1.0)
pH: 5.5 (ref 5.0–8.0)

## 2012-09-22 LAB — CBC WITH DIFFERENTIAL/PLATELET
Basophils Absolute: 0 10*3/uL (ref 0.0–0.1)
Basophils Relative: 0 % (ref 0–1)
Eosinophils Absolute: 0.3 10*3/uL (ref 0.0–0.7)
Eosinophils Relative: 3 % (ref 0–5)
HCT: 44.1 % (ref 36.0–46.0)
Hemoglobin: 14.3 g/dL (ref 12.0–15.0)
Lymphocytes Relative: 7 % — ABNORMAL LOW (ref 12–46)
Lymphs Abs: 0.8 10*3/uL (ref 0.7–4.0)
MCH: 23.3 pg — ABNORMAL LOW (ref 26.0–34.0)
MCHC: 32.4 g/dL (ref 30.0–36.0)
MCV: 71.8 fL — ABNORMAL LOW (ref 78.0–100.0)
Monocytes Absolute: 0.6 10*3/uL (ref 0.1–1.0)
Monocytes Relative: 6 % (ref 3–12)
Neutro Abs: 9.3 10*3/uL — ABNORMAL HIGH (ref 1.7–7.7)
Neutrophils Relative %: 84 % — ABNORMAL HIGH (ref 43–77)
Platelets: 154 10*3/uL (ref 150–400)
RBC: 6.14 MIL/uL — ABNORMAL HIGH (ref 3.87–5.11)
RDW: 14.7 % (ref 11.5–15.5)
WBC Morphology: INCREASED
WBC: 11.1 10*3/uL — ABNORMAL HIGH (ref 4.0–10.5)

## 2012-09-22 LAB — URINE MICROSCOPIC-ADD ON

## 2012-09-22 MED ORDER — ONDANSETRON HCL 4 MG/2ML IJ SOLN
4.0000 mg | Freq: Once | INTRAMUSCULAR | Status: AC
  Administered 2012-09-22: 4 mg via INTRAVENOUS

## 2012-09-22 MED ORDER — ONDANSETRON 4 MG PO TBDP: 4.0000 mg | ORAL_TABLET | Freq: Three times a day (TID) | ORAL | Status: DC | PRN

## 2012-09-22 MED ORDER — SODIUM CHLORIDE 0.9 % IV BOLUS (SEPSIS)
1000.0000 mL | Freq: Once | INTRAVENOUS | Status: AC
  Administered 2012-09-22: 1000 mL via INTRAVENOUS

## 2012-09-22 MED ORDER — ONDANSETRON HCL 4 MG/2ML IJ SOLN
INTRAMUSCULAR | Status: AC
  Filled 2012-09-22: qty 2

## 2012-09-22 NOTE — ED Notes (Signed)
Pt took a few sips of sprite and said "its staying down ok, but makes it feel a little rumbley in my tummy"

## 2012-09-22 NOTE — ED Provider Notes (Signed)
History     CSN: [id number]  Arrival date & time 09/22/12  1611   First MD Initiated Contact with Patient 09/22/12 1620      Chief Complaint  Patient presents with  . Emesis  . Chills    (Consider location/radiation/quality/duration/timing/severity/associated sxs/prior treatment) HPI Pt presents with c/o vomiting and nausea.  She has been visiting her husband who is in the hospital with nausea/vomiting and diarrhea.  She states symptoms began acutely today.  Multiple episodes of emesis without blood or bile.  No diarrhea.  No abdominal pain.  No fever, but has felt chills.  Has not been able to keep liquids down.  There are no other associated systemic symptoms, there are no other alleviating or modifying factors.   Past Medical History  Diagnosis Date  . Hypertension 09/29/2006  . Cholelithiasis   . Headache   . Anxiety   . Thrombocytopenia 08/27/2011    Past Surgical History  Procedure Laterality Date  . Appendectomy    . Abdominal hysterectomy      no oophorectomy  . Tonsillectomy      Family History  Problem Relation Age of Onset  . Coronary artery disease Father   . Diabetes      1st degree relative  . Hypertension Mother   . Prostate cancer      grandfather    History  Substance Use Topics  . Smoking status: Never Smoker   . Smokeless tobacco: Not on file  . Alcohol Use: No    OB History   Grav Para Term Preterm Abortions TAB SAB Ect Mult Living                  Review of Systems ROS reviewed and all otherwise negative except for mentioned in HPI  Allergies  Bee venom; Doxycycline; Penicillins; Azithromycin; Cerumenex; and Climara  Home Medications   Current Outpatient Rx  Name  Route  Sig  Dispense  Refill  . aspirin 81 MG tablet   Oral   Take 81 mg by mouth daily with breakfast.          . Calcium-Vitamin D (CALCIUM + D PO)   Oral   Take 2 tablets by mouth daily with lunch.          . carvedilol (COREG) 6.25 MG tablet   Oral  Take 1 tablet (6.25 mg total) by mouth 2 (two) times daily with a meal.   180 tablet   3   . cetirizine (ZYRTEC ALLERGY) 10 MG tablet   Oral   Take 10 mg by mouth daily.          . cycloSPORINE (RESTASIS) 0.05 % ophthalmic emulsion   Both Eyes   Place 1 drop into both eyes every 12 (twelve) hours.          Marland Kitchen glucosamine-chondroitin 500-400 MG tablet   Oral   Take 1 tablet by mouth 3 (three) times daily.           . Multiple Vitamin (MULTIVITAMIN WITH MINERALS) TABS   Oral   Take 1 tablet by mouth daily with breakfast.         . Polyethyl Glycol-Propyl Glycol (SYSTANE ULTRA) 0.4-0.3 % SOLN   Both Eyes   Place 1 drop into both eyes 3 (three) times daily as needed (dry eyes).          Marland Kitchen spironolactone (ALDACTONE) 25 MG tablet   Oral   Take 1 tablet (25 mg total) by mouth every  morning.   90 tablet   3   . ondansetron (ZOFRAN ODT) 4 MG disintegrating tablet   Oral   Take 1 tablet (4 mg total) by mouth every 8 (eight) hours as needed for nausea.   20 tablet   0     BP 106/59  Pulse 105  Temp(Src) 99.1 F (37.3 C) (Oral)  Resp 20  SpO2 99% Vitals reviewed Physical Exam Physical Examination: General appearance - alert, well appearing, and in no distress Mental status - alert, oriented to person, place, and time Eyes - no scleral icterus, no conjuctival injection Mouth - mucous membranes moist, pharynx normal without lesions Chest - clear to auscultation, no wheezes, rales or rhonchi, symmetric air entry Heart - normal rate, regular rhythm, normal S1, S2, no murmurs, rubs, clicks or gallops Abdomen - soft, nontender, nondistended, no masses or organomegaly, nabs Extremities - peripheral pulses normal, no pedal edema, no clubbing or cyanosis Skin - normal coloration and turgor, no rashes  ED Course  Procedures (including critical care time)  Labs Reviewed  COMPREHENSIVE METABOLIC PANEL - Abnormal; Notable for the following:    Glucose, Bld 102 (*)    GFR  calc non Af Amer 61 (*)    GFR calc Af Amer 70 (*)    All other components within normal limits  CBC WITH DIFFERENTIAL - Abnormal; Notable for the following:    WBC 11.1 (*)    RBC 6.14 (*)    MCV 71.8 (*)    MCH 23.3 (*)    Neutrophils Relative 84 (*)    Neutro Abs 9.3 (*)    Lymphocytes Relative 7 (*)    All other components within normal limits  URINALYSIS, ROUTINE W REFLEX MICROSCOPIC - Abnormal; Notable for the following:    Hgb urine dipstick TRACE (*)    All other components within normal limits  URINE MICROSCOPIC-ADD ON   No results found.   1. Nausea and vomiting       MDM  Pt presenting with c/o nausea and vomiting.  Her labs show mild elevation in WBC, no sign of UTI.  Abdominal exam benign.  Suspect viral gastroenteritis, pt feels improved after IV nausea meds and hydration.  She has tolerated po trial in the ED.  All results and plan were discussed with her at the bedside.  Discharged with strict return precautions.  Pt agreeable with plan.        Ethelda Chick, MD 09/22/12 2101

## 2012-09-22 NOTE — ED Notes (Addendum)
Pt states that her husband is here for n/v/d and she has been up here visiting him and began feeling nauseated and threw up. Also states she has been having chills. Afebrile. Denies CP/SOB.

## 2012-09-28 ENCOUNTER — Encounter: Payer: Self-pay | Admitting: Internal Medicine

## 2012-09-28 ENCOUNTER — Ambulatory Visit (INDEPENDENT_AMBULATORY_CARE_PROVIDER_SITE_OTHER): Payer: Medicare Other | Admitting: Internal Medicine

## 2012-09-28 VITALS — BP 128/74 | HR 68 | Temp 98.2°F | Wt 168.0 lb

## 2012-09-28 DIAGNOSIS — K5289 Other specified noninfective gastroenteritis and colitis: Secondary | ICD-10-CM

## 2012-09-28 DIAGNOSIS — K529 Noninfective gastroenteritis and colitis, unspecified: Secondary | ICD-10-CM

## 2012-09-28 NOTE — Progress Notes (Signed)
  Subjective:    Patient ID: Monique [name], female    DOB: [birth year], 72 y.o.   MRN: [id number]  HPI ER F/U [name] to the ER 09/22/2012 with nausea, vomiting. Husband had the same symptoms. Potassium, creatinine, hemoglobin were normal, WBC was slightly elevated at 11.1. Was diagnosed with viral gastroenteritis, received IV fluids and was d/c  Home.  Past Medical History  Diagnosis Date  . Hypertension 09/29/2006  . Cholelithiasis   . Headache   . Anxiety   . Thrombocytopenia 08/27/2011   Past Surgical History  Procedure Laterality Date  . Appendectomy    . Abdominal hysterectomy      no oophorectomy  . Tonsillectomy      Social History: Married, retired Education officer, environmental   Never Smoked Alcohol use-no husband has prostate ca and other health issues takes care of mom, 27 y/o who is blind , and an uncle    Family History: CAD--F   Diabetes --1st degree relative glian barre-- father   HTN-- M colon ca--no breast ca--no prostate ca-- GF   Review of Systems Since he left the ER, nausea has resolved, no vomiting, no need for  Zofran so far. Denies fever chills. No diarrhea, bowel movements have been normal, daily without blood. Still feeling weak, appetite is "okay", still has a ill defined abd dyscomfort. Tolerating fluids and small amounts of food.         Objective:   Physical Exam General -- alert, well-developed, NAD .    HEENT -- not pale, no jaundice Lungs -- normal respiratory effort, no intercostal retractions, no accessory muscle use, and normal breath sounds.   Heart-- normal rate, regular rhythm, no murmur, and no gallop.   Abdomen--not distended, soft, increased bowel sounds, nontender Extremities-- no pretibial edema bilaterally  Neurologic-- alert & oriented X3 and strength normal in all extremities. Psych-- Cognition and judgment appear intact. Alert and cooperative with normal attention span and concentration.  not anxious appearing and not depressed appearing.         Assessment & Plan:  Acute gastroenteritis Improving but not completely well, unable to print instructions but they were discussed w/ patient

## 2012-09-28 NOTE — Patient Instructions (Addendum)
Drink plenty of clear fluids. Follow a bland diet (soup, crackers, rice) avoid heavy foods. Go back to a  normal diet gradually as you get better. If not completely well in one week to 10 days  let me know. Call anytime if you have worsening  symptoms, fever, chills, stomach pain, constipation or blood in the stools.

## 2012-10-06 ENCOUNTER — Telehealth: Payer: Self-pay | Admitting: *Deleted

## 2012-10-06 NOTE — Telephone Encounter (Signed)
[name], MD - was seen for ER f/u, please check on her, better?  lmovm for pt to return call.

## 2012-10-18 DIAGNOSIS — Z1289 Encounter for screening for malignant neoplasm of other sites: Secondary | ICD-10-CM | POA: Diagnosis not present

## 2012-10-18 DIAGNOSIS — Z1212 Encounter for screening for malignant neoplasm of rectum: Secondary | ICD-10-CM | POA: Diagnosis not present

## 2012-10-18 DIAGNOSIS — Z1231 Encounter for screening mammogram for malignant neoplasm of breast: Secondary | ICD-10-CM | POA: Diagnosis not present

## 2012-11-23 ENCOUNTER — Ambulatory Visit (INDEPENDENT_AMBULATORY_CARE_PROVIDER_SITE_OTHER): Payer: Medicare Other | Admitting: Internal Medicine

## 2012-11-23 ENCOUNTER — Encounter: Payer: Self-pay | Admitting: Internal Medicine

## 2012-11-23 VITALS — BP 128/84 | HR 70 | Ht 60.5 in | Wt 167.0 lb

## 2012-11-23 DIAGNOSIS — Z0000 Encounter for general adult medical examination without abnormal findings: Secondary | ICD-10-CM | POA: Diagnosis not present

## 2012-11-23 DIAGNOSIS — J309 Allergic rhinitis, unspecified: Secondary | ICD-10-CM

## 2012-11-23 DIAGNOSIS — E785 Hyperlipidemia, unspecified: Secondary | ICD-10-CM | POA: Diagnosis not present

## 2012-11-23 DIAGNOSIS — R7303 Prediabetes: Secondary | ICD-10-CM

## 2012-11-23 DIAGNOSIS — R7309 Other abnormal glucose: Secondary | ICD-10-CM

## 2012-11-23 DIAGNOSIS — I10 Essential (primary) hypertension: Secondary | ICD-10-CM | POA: Diagnosis not present

## 2012-11-23 LAB — LIPID PANEL
Cholesterol: 173 mg/dL (ref 0–200)
HDL: 54.8 mg/dL (ref >39.00)
LDL Cholesterol: 95 mg/dL (ref 0–99)
Total CHOL/HDL Ratio: 3
Triglycerides: 115 mg/dL (ref 0.0–149.0)
VLDL: 23 mg/dL (ref 0.0–40.0)

## 2012-11-23 LAB — HEMOGLOBIN A1C: Hgb A1c MFr Bld: 6 % (ref 4.6–6.5)

## 2012-11-23 LAB — TSH: TSH: 0.89 u[IU]/mL (ref 0.35–5.50)

## 2012-11-23 NOTE — Assessment & Plan Note (Signed)
Well-controlled, chart reviewed, last BMP normal

## 2012-11-23 NOTE — Assessment & Plan Note (Addendum)
Td 2006 pneumonia shot 11-08 zostavax 2007 Saw Dr Greta Doom this year, had a  MMG  Cscope 10-2005 (-) @ [place], next per gi (addendum, we called , next 2017) DEXA 5-09 and 7-11 neg Counseled--diet, exercise, ca and vit D

## 2012-11-23 NOTE — Patient Instructions (Addendum)
Next visit in 6 months  Fall Prevention and Home Safety Falls cause injuries and can affect all age groups. It is possible to use preventive measures to significantly decrease the likelihood of falls. There are many simple measures which can make your home safer and prevent falls. OUTDOORS  Repair cracks and edges of walkways and driveways.  Remove high doorway thresholds.  Trim shrubbery on the main path into your home.  Have good outside lighting.  Clear walkways of tools, rocks, debris, and clutter.  Check that handrails are not broken and are securely fastened. Both sides of steps should have handrails.  Have leaves, snow, and ice cleared regularly.  Use sand or salt on walkways during winter months.  In the garage, clean up grease or oil spills. BATHROOM  Install night lights.  Install grab bars by the toilet and in the tub and shower.  Use non-skid mats or decals in the tub or shower.  Place a plastic non-slip stool in the shower to sit on, if needed.  Keep floors dry and clean up all water on the floor immediately.  Remove soap buildup in the tub or shower on a regular basis.  Secure bath mats with non-slip, double-sided rug tape.  Remove throw rugs and tripping hazards from the floors. BEDROOMS  Install night lights.  Make sure a bedside light is easy to reach.  Do not use oversized bedding.  Keep a telephone by your bedside.  Have a firm chair with side arms to use for getting dressed.  Remove throw rugs and tripping hazards from the floor. KITCHEN  Keep handles on pots and pans turned toward the center of the stove. Use back burners when possible.  Clean up spills quickly and allow time for drying.  Avoid walking on wet floors.  Avoid hot utensils and knives.  Position shelves so they are not too high or low.  Place commonly used objects within easy reach.  If necessary, use a sturdy step stool with a grab bar when reaching.  Keep  electrical cables out of the way.  Do not use floor polish or wax that makes floors slippery. If you must use wax, use non-skid floor wax.  Remove throw rugs and tripping hazards from the floor. STAIRWAYS  Never leave objects on stairs.  Place handrails on both sides of stairways and use them. Fix any loose handrails. Make sure handrails on both sides of the stairways are as long as the stairs.  Check carpeting to make sure it is firmly attached along stairs. Make repairs to worn or loose carpet promptly.  Avoid placing throw rugs at the top or bottom of stairways, or properly secure the rug with carpet tape to prevent slippage. Get rid of throw rugs, if possible.  Have an electrician put in a light switch at the top and bottom of the stairs. OTHER FALL PREVENTION TIPS  Wear low-heel or rubber-soled shoes that are supportive and fit well. Wear closed toe shoes.  When using a stepladder, make sure it is fully opened and both spreaders are firmly locked. Do not climb a closed stepladder.  Add color or contrast paint or tape to grab bars and handrails in your home. Place contrasting color strips on first and last steps.  Learn and use mobility aids as needed. Install an electrical emergency response system.  Turn on lights to avoid dark areas. Replace light bulbs that burn out immediately. Get light switches that glow.  Arrange furniture to create clear pathways.   Keep furniture in the same place.  Firmly attach carpet with non-skid or double-sided tape.  Eliminate uneven floor surfaces.  Select a carpet pattern that does not visually hide the edge of steps.  Be aware of all pets. OTHER HOME SAFETY TIPS  Set the water temperature for 120 F (48.8 C).  Keep emergency numbers on or near the telephone.  Keep smoke detectors on every level of the home and near sleeping areas. Document Released: 06/06/2002 Document Revised: 12/16/2011 Document Reviewed: 09/05/2011 [employer]  Patient Information 2014 [employer].  

## 2012-11-23 NOTE — Assessment & Plan Note (Signed)
Well-controlled with Zyrtec as needed

## 2012-11-23 NOTE — Assessment & Plan Note (Signed)
Will check a hemoglobin A1c

## 2012-11-23 NOTE — Progress Notes (Signed)
Subjective:    Patient ID: Monique [name], female    DOB: [birth year], 72 y.o.   MRN: [id number]  HPI Here for Medicare AWV: 1. Risk factors based on Past M, S, F history: reviewed 2. Physical Activities:  Active, yard work, looking into going back to the gym 3. Depression/mood:  (-)   4. Hearing:  No problemss noted or reported   5. ADL's:  Totally independent   6. Fall Risk: had a fall x 1 last year, no major injury, see instructions   7. home Safety: does feel safe at home   8. Height, weight, &visual acuity: see VS, uses glasses, sees eye doctor routinely   9. Counseling: provided 10. Labs ordered based on risk factors: if needed   11. Referral Coordination: if needed 12.  Care Plan, see assessment and plan   13.   Cognitive Assessment: Motor skills and cognition within normal  In addition, today we discussed the following: Allergies well controlled with Zyrtec as needed. Hypertension, good medication compliance, no apparent side effects, ambulatory BPs normal. Dry eye syndrome, symptoms well-controlled.  Past Medical History  Diagnosis Date  . Hypertension 09/29/2006  . Cholelithiasis   . Headache   . Anxiety   . Thrombocytopenia 08/27/2011    hematology evaluation 08-2011, return prn  . Dry eye syndrome   . DJD (degenerative joint disease)    Past Surgical History  Procedure Laterality Date  . Appendectomy    . Abdominal hysterectomy      no oophorectomy  . Tonsillectomy     History   Social History  . Marital Status: Married    Spouse Name: N/A    Number of Children: 3  . Years of Education: N/A   Occupational History  . 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 Smoking status: Never Smoker   . Smokeless tobacco: Never Used  . Alcohol Use: No  . Drug Use: No  . Sexually Active: Not on file   Other Topics Concern  . Not on file   Social History Narrative   husband has prostate ca and other health issues   takes care of mom, 40  y/o who is blind , and an uncle           Family History  Problem Relation Age of Onset  . Coronary artery disease Father   . Diabetes Other     uncles   . Hypertension Mother   . Prostate cancer Other     grandfather  . Breast cancer Neg Hx   . Colon cancer Neg Hx      Review of Systems General, denies: Fever   Chills   Fatigue   Cardiovascular, denies: CP  SOB Palpitations   Edema  Respiratory, denies: Cough    sputum production   wheezing    shortness or breath GI, denies: Nausea  Vomiting  Diarrhea  Blood in the stools GU, denies: Dysuria     Gross hematuria  Difficulty urinating    Vaginal discharge    Unusual vaginal bleeding        Neuro: No headache        Objective:   Physical Exam BP 128/84  Pulse 70  Ht 5' 0.5" (1.537 m)  Wt 167 lb (75.751 kg)  BMI 32.07 kg/m2  SpO2 97%  General -- alert, well-developed, NAD .   Neck --no thyromegaly , normal carotid pulse Lungs -- normal respiratory effort,  no intercostal retractions, no accessory muscle use, and normal breath sounds.   Heart-- normal rate, regular rhythm, no murmur, and no gallop.   Abdomen--soft, non-tender, no distention, no masses, no HSM, no guarding, and no rigidity.   Extremities-- no pretibial edema bilaterally  Neurologic-- alert & oriented X3 and strength normal in all extremities. Psych-- Cognition and judgment appear intact. Alert and cooperative with normal attention span and concentration.  not anxious appearing and not depressed appearing.       Assessment & Plan:

## 2012-11-23 NOTE — Assessment & Plan Note (Signed)
Labs

## 2012-11-25 ENCOUNTER — Encounter: Payer: Self-pay | Admitting: *Deleted

## 2012-12-02 ENCOUNTER — Telehealth: Payer: Self-pay | Admitting: Gastroenterology

## 2012-12-02 ENCOUNTER — Telehealth: Payer: Self-pay | Admitting: *Deleted

## 2012-12-02 NOTE — Telephone Encounter (Signed)
Sent for chart info.

## 2012-12-02 NOTE — Telephone Encounter (Signed)
Error

## 2012-12-02 NOTE — Telephone Encounter (Signed)
Last COLON 11/10/2005 which was normal. Monique [name] back and informed her recall should be in 10 days; RECALL entered for 2017.

## 2012-12-27 ENCOUNTER — Telehealth: Payer: Self-pay | Admitting: Internal Medicine

## 2012-12-27 NOTE — Telephone Encounter (Signed)
Spoke with pt, scheduled her an appt w/ Dr. Alwyn Ren tomorrow @10a .

## 2012-12-27 NOTE — Telephone Encounter (Signed)
Patient states her anxiety is worsening and she would like something sent to her pharmacy for her nerves. I made pt aware that Dr. Drue Novel is out of the office until Monday. [facility] uses [facility] [address].

## 2012-12-28 ENCOUNTER — Encounter: Payer: Self-pay | Admitting: Internal Medicine

## 2012-12-28 ENCOUNTER — Ambulatory Visit (INDEPENDENT_AMBULATORY_CARE_PROVIDER_SITE_OTHER): Payer: Medicare Other | Admitting: Internal Medicine

## 2012-12-28 VITALS — BP 132/80 | HR 88 | Wt 168.0 lb

## 2012-12-28 DIAGNOSIS — F411 Generalized anxiety disorder: Secondary | ICD-10-CM | POA: Diagnosis not present

## 2012-12-28 DIAGNOSIS — F418 Other specified anxiety disorders: Secondary | ICD-10-CM

## 2012-12-28 MED ORDER — SERTRALINE HCL 50 MG PO TABS: ORAL_TABLET | ORAL | Status: DC

## 2012-12-28 NOTE — Progress Notes (Signed)
  Subjective:    Patient ID: Monique [name], female    DOB: [birth year], 72 y.o.   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 Specifically the health needs of her mother, husband, and maternal uncle have increased recently and require frequent medical appointments ; ER visits; and significant travel.  She had been exercising as water aerobics but these commitments have prevented that. She does try to get some quiet time each day.  Her TSH was 0.9 on 11/23/12.  Mother has PMH anxiety , depression & panic attacks. M uncle depression.     Review of Systems  She denies any significant change in her hair, skin, nails. She has no blurred vision, double vision, or loss of vision. She is not having palpitations,weight loss, constipation, or diarrhea.  Appetite and sleep have not been affected significantly.  She denies significant depression.     Objective:   Physical Exam Gen.: Healthy and well-nourished in appearance. Alert, appropriate and cooperative throughout exam.Appears younger than stated age   Eyes: No corneal or conjunctival inflammation noted. No lid lag or proptosis. Extraocular motion intact.  Neck: No deformities, masses, or tenderness noted.  Thyroid normal. Lungs: Normal respiratory effort; chest expands symmetrically. Lungs are clear to auscultation without rales, wheezes, or increased work of breathing. Heart: Normal rate and rhythm. Normal S1 and S2. No gallop, click, or rub. No murmur.                                 Musculoskeletal/extremities:No clubbing, cyanosis, edema, or significant extremity  deformity noted. Tone & strength  Normal. Joints  reveal isolated  DJD DIP changes. Nail health good; no onycholysis. Neurologic: Alert and oriented x3. Deep tendon reflexes symmetrical and normal.        Skin: Intact without suspicious lesions or rashes. Lymph: No cervical, axillary lymphadenopathy present. Psych:  Mood and affect are normal. Normally interactive                                                                                        Assessment & Plan:  #1 anxiety in context of exogenous stresses & + FH of anxiety, depression, & panic attacks  The pathophysiology of neurotransmitter deficiency was discussed along with the benefits and potential adverse effects of SSRI therapy.

## 2012-12-28 NOTE — Patient Instructions (Addendum)
RTC 4 weeks with Dr Drue Novel

## 2013-01-10 DIAGNOSIS — E119 Type 2 diabetes mellitus without complications: Secondary | ICD-10-CM | POA: Diagnosis not present

## 2013-01-10 LAB — HM DIABETES EYE EXAM

## 2013-01-19 ENCOUNTER — Encounter: Payer: Self-pay | Admitting: Internal Medicine

## 2013-01-25 ENCOUNTER — Encounter: Payer: Self-pay | Admitting: Internal Medicine

## 2013-01-25 ENCOUNTER — Ambulatory Visit (INDEPENDENT_AMBULATORY_CARE_PROVIDER_SITE_OTHER): Payer: Medicare Other | Admitting: Internal Medicine

## 2013-01-25 VITALS — BP 110/82 | HR 85 | Temp 97.9°F | Wt 160.2 lb

## 2013-01-25 DIAGNOSIS — F411 Generalized anxiety disorder: Secondary | ICD-10-CM

## 2013-01-25 DIAGNOSIS — F418 Other specified anxiety disorders: Secondary | ICD-10-CM

## 2013-01-25 MED ORDER — SERTRALINE HCL 50 MG PO TABS: 75.0000 mg | ORAL_TABLET | Freq: Every day | ORAL | Status: DC

## 2013-01-25 NOTE — Progress Notes (Signed)
  Subjective:    Patient ID: Monique [name], female    DOB: 1941-05-04, 72 y.o.   MRN: [id number]  HPI Followup Was seen by one of my partners 12/28/2012 with anxiety triggered by taking care of  3 family members. She started Zoloft, currently on 50 mg daily. Reports she is feeling some better.  Past Medical History  Diagnosis Date  . Hypertension 09/29/2006  . Cholelithiasis   . Headache(784.0)   . Anxiety   . Thrombocytopenia 08/27/2011    hematology evaluation 08-2011, return prn  . Dry eye syndrome   . DJD (degenerative joint disease)    Past Surgical History  Procedure Laterality Date  . Appendectomy    . Abdominal hysterectomy      no oophorectomy  . Tonsillectomy     History   Social History  . Marital Status: Married    Spouse Name: N/A    Number of Children: 3  . Years of Education: N/A   Occupational History  . 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 Smoking status: Never Smoker   . Smokeless tobacco: Never Used  . Alcohol Use: No  . Drug Use: No  . Sexually Active: Not on file   Other Topics Concern  . Not on file   Social History Narrative   husband has prostate ca and other health issues   takes care of mom, 30 y/o who is blind , and an uncle             Review of Systems Occasionally she feels mildly depressed but denies suicidal ideas. She reports no side effects from SSRIs specifically no nausea, vomiting or diarrhea. Sleeps well    Objective:   Physical Exam  BP 110/82  Pulse 85  Temp(Src) 97.9 F (36.6 C) (Oral)  Wt 160 lb 3.2 oz (72.666 kg)  BMI 30.76 kg/m2  SpO2 96% General -- alert, well-developed Neurologic-- alert & oriented X3 and strength normal in all extremities. Psych-- Cognition and judgment appear intact. Alert and cooperative with normal attention span and concentration.  not anxious appearing and not depressed appearing.       Assessment & Plan:

## 2013-01-25 NOTE — Assessment & Plan Note (Addendum)
Started Zoloft 12/28/2012, improved. I asked her if she is were she needs to be ,  she admits there is room for improvement. Patient is counseled, She is somehow reluctant to take more medication but we eventually agreed to increase Zoloft to 75 mg daily. Next visit in 3-4 months, in the meantime she will call if she has side effects or if her mood is not improving

## 2013-04-12 DIAGNOSIS — Z23 Encounter for immunization: Secondary | ICD-10-CM | POA: Diagnosis not present

## 2013-04-27 ENCOUNTER — Encounter: Payer: Self-pay | Admitting: Internal Medicine

## 2013-04-27 ENCOUNTER — Ambulatory Visit (INDEPENDENT_AMBULATORY_CARE_PROVIDER_SITE_OTHER): Payer: Medicare Other | Admitting: Internal Medicine

## 2013-04-27 VITALS — BP 132/83 | HR 84 | Temp 98.0°F | Wt 162.0 lb

## 2013-04-27 DIAGNOSIS — F411 Generalized anxiety disorder: Secondary | ICD-10-CM | POA: Diagnosis not present

## 2013-04-27 DIAGNOSIS — M549 Dorsalgia, unspecified: Secondary | ICD-10-CM | POA: Diagnosis not present

## 2013-04-27 LAB — URINALYSIS, ROUTINE W REFLEX MICROSCOPIC
Bilirubin Urine: NEGATIVE
Ketones, ur: NEGATIVE
Nitrite: NEGATIVE
Specific Gravity, Urine: >=1.03 (ref 1.000–1.030)
Urine Glucose: NEGATIVE
Urobilinogen, UA: 0.2 (ref 0.0–1.0)
pH: 6 (ref 5.0–8.0)

## 2013-04-27 NOTE — Progress Notes (Signed)
  Subjective:    Patient ID: Monique [name], female    DOB: [birth year], 72 y.o.   MRN: [id number]  HPI Followup from previous visit Anxiety, self discontinue Zoloft due to to dry mouth, mouth feels better now. Husband has  cancer but is in remission and she is feeling better. She sleeps well. No suicidal ideas. (Made her aware that Zyrtec  also cause dry eyes)  Past Medical History  Diagnosis Date  . Hypertension 09/29/2006  . Cholelithiasis   . Headache(784.0)   . Anxiety   . Thrombocytopenia 08/27/2011    hematology evaluation 08-2011, return prn  . Dry eye syndrome   . DJD (degenerative joint disease)    Past Surgical History  Procedure Laterality Date  . Appendectomy    . Abdominal hysterectomy      no oophorectomy  . Tonsillectomy       Review of Systems From time to time has a back ache, no radiation, and the left or right and b/c of that  would like her urine to be checked. Denies dysuria per se, gross hematuria. No vaginal discharge or rash.    Objective:   Physical Exam  BP 132/83  Pulse 84  Temp(Src) 98 F (36.7 C)  Wt 162 lb (73.483 kg)  BMI 31.11 kg/m2  SpO2 94% General -- alert, well-developed, NAD.   Abdomen-- Not distended, good bowel sounds,soft, non-tender. No rebound or rigidity. No mass,organomegaly. No CVA tenderness  Extremities-- no pretibial edema bilaterally  Neurologic--  alert & oriented X3. Speech normal, gait normal, strength normal in all extremities.  Psych-- Cognition and judgment appear intact. Cooperative with normal attention span and concentration. No anxious appearing , no depressed appearing.     Assessment & Plan:   Back ache, Recommend to call if symptoms increase, will check a urinalysis although I doubt has a UTI

## 2013-04-27 NOTE — Assessment & Plan Note (Signed)
Self discontinue Zoloft due to dry eyes, at this point however she is doing well, husband prostate cancer is in remission. Does not need further medication at this point, follow up in 4 months, encouraged her to call me sooner if needed.

## 2013-04-27 NOTE — Patient Instructions (Addendum)
Get your urine test before you leave  Next visit in 4 months for a  follow up . No Fasting Please make an appointment

## 2013-05-04 DIAGNOSIS — M549 Dorsalgia, unspecified: Secondary | ICD-10-CM | POA: Diagnosis not present

## 2013-05-04 NOTE — Addendum Note (Signed)
Addended by: Silvio Pate D on: 05/04/2013 02:08 PM   Modules accepted: Orders

## 2013-05-06 ENCOUNTER — Telehealth: Payer: Self-pay | Admitting: Internal Medicine

## 2013-05-06 ENCOUNTER — Telehealth: Payer: Self-pay | Admitting: *Deleted

## 2013-05-06 LAB — URINE CULTURE: Colony Count: >=100000

## 2013-05-06 MED ORDER — SPIRONOLACTONE 25 MG PO TABS: 25.0000 mg | ORAL_TABLET | Freq: Every morning | ORAL | Status: DC

## 2013-05-06 MED ORDER — CARVEDILOL 6.25 MG PO TABS: 6.2500 mg | ORAL_TABLET | Freq: Two times a day (BID) | ORAL | Status: DC

## 2013-05-06 MED ORDER — CIPROFLOXACIN HCL 500 MG PO TABS: 500.0000 mg | ORAL_TABLET | Freq: Two times a day (BID) | ORAL | Status: DC

## 2013-05-06 NOTE — Telephone Encounter (Signed)
rx orderd and sent. Lmovm. DJR

## 2013-05-06 NOTE — Telephone Encounter (Signed)
Patient called and requested a refill for   spironolactone (ALDACTONE) 25 MG tablet carvedilol (COREG) 6.25 MG tablet   Pharmacy [id number] - [place], [place] - [address]

## 2013-05-06 NOTE — Telephone Encounter (Signed)
rx refilled per protocol. DJR  

## 2013-05-06 NOTE — Telephone Encounter (Signed)
Message copied by Eustace Quail on Fri May 06, 2013 10:48 AM ------      Message from: Monique [name]      Created: Fri May 06, 2013  8:14 AM       Urine culture showing Escherichia coli      Call patient, needs to start ciprofloxacin 500 mg one by mouth twice a day for one week, #14 , no results. ------

## 2013-05-24 ENCOUNTER — Ambulatory Visit: Payer: Medicare Other | Admitting: Internal Medicine

## 2013-08-29 ENCOUNTER — Encounter: Payer: Self-pay | Admitting: Internal Medicine

## 2013-08-29 ENCOUNTER — Ambulatory Visit (INDEPENDENT_AMBULATORY_CARE_PROVIDER_SITE_OTHER): Payer: Medicare Other | Admitting: Internal Medicine

## 2013-08-29 VITALS — BP 120/78 | HR 70 | Temp 98.2°F | Wt 167.0 lb

## 2013-08-29 DIAGNOSIS — I10 Essential (primary) hypertension: Secondary | ICD-10-CM

## 2013-08-29 DIAGNOSIS — N390 Urinary tract infection, site not specified: Secondary | ICD-10-CM

## 2013-08-29 DIAGNOSIS — M549 Dorsalgia, unspecified: Secondary | ICD-10-CM | POA: Diagnosis not present

## 2013-08-29 LAB — URINALYSIS, ROUTINE W REFLEX MICROSCOPIC
Bilirubin Urine: NEGATIVE
Hgb urine dipstick: NEGATIVE
Ketones, ur: NEGATIVE
Leukocytes, UA: NEGATIVE
Nitrite: NEGATIVE
RBC / HPF: NONE SEEN (ref 0–?)
Specific Gravity, Urine: 1.02 (ref 1.000–1.030)
Total Protein, Urine: NEGATIVE
Urine Glucose: NEGATIVE
Urobilinogen, UA: 0.2 (ref 0.0–1.0)
pH: 5.5 (ref 5.0–8.0)

## 2013-08-29 LAB — BASIC METABOLIC PANEL
BUN: 14 mg/dL (ref 6–23)
CO2: 26 mEq/L (ref 19–32)
Calcium: 9.5 mg/dL (ref 8.4–10.5)
Chloride: 106 mEq/L (ref 96–112)
Creatinine, Ser: 0.9 mg/dL (ref 0.4–1.2)
GFR: 82.05 mL/min (ref >60.00)
Glucose, Bld: 89 mg/dL (ref 70–99)
Potassium: 3.7 mEq/L (ref 3.5–5.1)
Sodium: 140 mEq/L (ref 135–145)

## 2013-08-29 NOTE — Patient Instructions (Signed)
Get your blood work before you leave   Next visit is for a physical exam in 3 months   fasting Please make an appointment    For back pain: Stretching 20 minutes daily  Tylenol  500 mg OTC 2 tabs a day every 8 hours as needed for pain Call if symptoms severe

## 2013-08-29 NOTE — Assessment & Plan Note (Addendum)
Pain has resurface for the last few months, the description of symptoms is vague , recommend stretching (She used to do physical therapy and a has all the information), Tylenol as needed,

## 2013-08-29 NOTE — Assessment & Plan Note (Signed)
Symptoms well-controlled, check a BMP 

## 2013-08-29 NOTE — Progress Notes (Signed)
Subjective:    Patient ID: Monique [name], female    DOB: [birth year], 73 y.o.   MRN: [id number]  DOS:  08/29/2013 Type of  Visit:  ROV Back pain-- sx re-surface a few months ago, at that time she had a UTI and wonder if the issues are related. Pain is in the lower back, occasionally radiates to the right hip, not taking anything specific meds for that, denies LE paresthesias; Patient description of the symptoms was really vague but denies nocturnal pain,symptoms are  on and off, not necessarily worse with bending or twisting hertorso. Anxiety--on no medications, mostly related to her husband's health, he is a stable. Hypertension--good medication compliance, ambulatory BPs 120, 130.   ROS Denies dysuria, gross hematuria or difficulty urinating No abdominal pain, nausea, vomiting or diarrhea  Past Medical History  Diagnosis Date  . Hypertension 09/29/2006  . Cholelithiasis   . Headache(784.0)   . Anxiety   . Thrombocytopenia 08/27/2011    hematology evaluation 08-2011, return prn  . Dry eye syndrome   . DJD (degenerative joint disease)     Past Surgical History  Procedure Laterality Date  . Appendectomy    . Abdominal hysterectomy      no oophorectomy  . Tonsillectomy      History   Social History  . Marital Status: Married    Spouse Name: N/A    Number of Children: 3  . Years of Education: N/A   Occupational History  . 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 Smoking status: Never Smoker   . Smokeless tobacco: Never Used  . Alcohol Use: No  . Drug Use: No  . Sexual Activity: Not on file   Other Topics Concern  . Not on file   Social History Narrative   husband has prostate ca and other health issues   takes care of mom, 5 y/o who is blind , and an uncle                Medication List       This list is accurate as of: 08/29/13 12:15 PM.  Always use your most recent med list.               aspirin 81 MG tablet  Take 81 mg  by mouth daily with breakfast.     CALCIUM + D PO  Take 2 tablets by mouth daily with lunch.     carvedilol 6.25 MG tablet  Commonly known as:  COREG  Take 1 tablet (6.25 mg total) by mouth 2 (two) times daily with a meal.     cycloSPORINE 0.05 % ophthalmic emulsion  Commonly known as:  RESTASIS  Place 1 drop into both eyes every 12 (twelve) hours.     glucosamine-chondroitin 500-400 MG tablet  Take 1 tablet by mouth 3 (three) times daily.     multivitamin with minerals Tabs tablet  Take 1 tablet by mouth daily with breakfast.     spironolactone 25 MG tablet  Commonly known as:  ALDACTONE  Take 1 tablet (25 mg total) by mouth every morning.     SYSTANE ULTRA 0.4-0.3 % Soln  Generic drug:  Polyethyl Glycol-Propyl Glycol  Place 1 drop into both eyes 3 (three) times daily as needed (dry eyes).     ZYRTEC ALLERGY 10 MG tablet  Generic drug:  cetirizine  Take 10 mg by mouth daily.  Objective:   Physical Exam BP 120/78  Pulse 70  Temp(Src) 98.2 F (36.8 C)  Wt 167 lb (75.751 kg)  SpO2 97%  General -- alert, well-developed, NAD.  Lungs -- normal respiratory effort, no intercostal retractions, no accessory muscle use, and normal breath sounds.  Heart-- normal rate, regular rhythm, no murmur.  Abdomen-- Not distended, good bowel sounds,soft, non-tender. Back-- no TTP Extremities-- no pretibial edema bilaterally  Neurologic--  alert & oriented X3. Speech normal, gait normal, strength normal in all extremities.  DTRs symmetric , Straight leg test negative Psych-- Cognition and judgment appear intact. Cooperative with normal attention span and concentration. No anxious or depressed appearing.      Assessment & Plan:

## 2013-08-29 NOTE — Progress Notes (Signed)
Pre visit review using our clinic review tool, if applicable. No additional management support is needed unless otherwise documented below in the visit note. 

## 2013-08-29 NOTE — Assessment & Plan Note (Signed)
Documented UTI a few months ago, at that time she was asymptomatic, plan: recheck a Udip

## 2013-08-30 ENCOUNTER — Telehealth: Payer: Self-pay | Admitting: Internal Medicine

## 2013-08-30 LAB — URINE CULTURE
Colony Count: NO GROWTH
Organism ID, Bacteria: NO GROWTH

## 2013-08-30 NOTE — Telephone Encounter (Signed)
Relevant patient education mailed to patient.  

## 2013-08-31 ENCOUNTER — Encounter: Payer: Self-pay | Admitting: *Deleted

## 2013-10-25 ENCOUNTER — Other Ambulatory Visit: Payer: Self-pay | Admitting: Internal Medicine

## 2013-11-03 DIAGNOSIS — N951 Menopausal and female climacteric states: Secondary | ICD-10-CM | POA: Diagnosis not present

## 2013-11-03 DIAGNOSIS — Z1231 Encounter for screening mammogram for malignant neoplasm of breast: Secondary | ICD-10-CM | POA: Diagnosis not present

## 2013-11-03 DIAGNOSIS — M810 Age-related osteoporosis without current pathological fracture: Secondary | ICD-10-CM | POA: Diagnosis not present

## 2013-11-03 DIAGNOSIS — Z1212 Encounter for screening for malignant neoplasm of rectum: Secondary | ICD-10-CM | POA: Diagnosis not present

## 2013-11-03 LAB — HM MAMMOGRAPHY

## 2013-11-09 DIAGNOSIS — Z780 Asymptomatic menopausal state: Secondary | ICD-10-CM | POA: Diagnosis not present

## 2013-11-29 ENCOUNTER — Telehealth: Payer: Self-pay

## 2013-11-29 NOTE — Telephone Encounter (Signed)
Medication and allergies:  Reviewed and updated  90 day supply/mail order: na [facility]: [facility] [address]   Immunizations due:  UTD  A/P:   No changes to FH, PSH or Personal Hx Flu vaccine--03/2013 Tdap--2014 PNA--2008 Shingles--2007 Pap--Dr Kela Millin will bring results to [facility]--Dr Kela Millin will bring results to [facility]---Dr Kela Millin will bring results to [facility]--10/2005--Dr Patterson--nml  To Discuss with Provider: Not at this time

## 2013-11-30 ENCOUNTER — Encounter: Payer: Self-pay | Admitting: Internal Medicine

## 2013-11-30 ENCOUNTER — Ambulatory Visit (INDEPENDENT_AMBULATORY_CARE_PROVIDER_SITE_OTHER): Payer: Medicare Other | Admitting: Internal Medicine

## 2013-11-30 VITALS — BP 117/75 | HR 72 | Temp 97.3°F | Ht 61.2 in | Wt 160.0 lb

## 2013-11-30 DIAGNOSIS — Z23 Encounter for immunization: Secondary | ICD-10-CM

## 2013-11-30 DIAGNOSIS — Z0000 Encounter for general adult medical examination without abnormal findings: Secondary | ICD-10-CM

## 2013-11-30 DIAGNOSIS — I10 Essential (primary) hypertension: Secondary | ICD-10-CM

## 2013-11-30 DIAGNOSIS — R7309 Other abnormal glucose: Secondary | ICD-10-CM | POA: Diagnosis not present

## 2013-11-30 DIAGNOSIS — R7303 Prediabetes: Secondary | ICD-10-CM

## 2013-11-30 LAB — CBC WITH DIFFERENTIAL/PLATELET
Basophils Absolute: 0 10*3/uL (ref 0.0–0.1)
Basophils Relative: 0.8 % (ref 0.0–3.0)
Eosinophils Absolute: 0.3 10*3/uL (ref 0.0–0.7)
Eosinophils Relative: 5.9 % — ABNORMAL HIGH (ref 0.0–5.0)
HCT: 42.1 % (ref 36.0–46.0)
Hemoglobin: 13.6 g/dL (ref 12.0–15.0)
Lymphocytes Relative: 32.9 % (ref 12.0–46.0)
Lymphs Abs: 1.9 10*3/uL (ref 0.7–4.0)
MCHC: 32.2 g/dL (ref 30.0–36.0)
MCV: 72.1 fl — ABNORMAL LOW (ref 78.0–100.0)
Monocytes Absolute: 0.4 10*3/uL (ref 0.1–1.0)
Monocytes Relative: 6.4 % (ref 3.0–12.0)
Neutro Abs: 3.1 10*3/uL (ref 1.4–7.7)
Neutrophils Relative %: 54 % (ref 43.0–77.0)
Platelets: 154 10*3/uL (ref 150.0–400.0)
RBC: 5.84 Mil/uL — ABNORMAL HIGH (ref 3.87–5.11)
RDW: 15.2 % (ref 11.5–15.5)
WBC: 5.8 10*3/uL (ref 4.0–10.5)

## 2013-11-30 LAB — LIPID PANEL
Cholesterol: 173 mg/dL (ref 0–200)
HDL: 52.6 mg/dL (ref >39.00)
LDL Cholesterol: 101 mg/dL — ABNORMAL HIGH (ref 0–99)
NonHDL: 120.4
Total CHOL/HDL Ratio: 3
Triglycerides: 97 mg/dL (ref 0.0–149.0)
VLDL: 19.4 mg/dL (ref 0.0–40.0)

## 2013-11-30 LAB — HEMOGLOBIN A1C: Hgb A1c MFr Bld: 5.9 % (ref 4.6–6.5)

## 2013-11-30 NOTE — Assessment & Plan Note (Signed)
Well-controlled, no change, labs 

## 2013-11-30 NOTE — Assessment & Plan Note (Signed)
Checking a CBC

## 2013-11-30 NOTE — Patient Instructions (Signed)
Get your blood work before you leave    Next visit is for routine check up regards your blood sugar , blood pressure in 6 months.  No need to come back fasting Please make an appointment       Fall Prevention and Home Safety Falls cause injuries and can affect all age groups. It is possible to use preventive measures to significantly decrease the likelihood of falls. There are many simple measures which can make your home safer and prevent falls. OUTDOORS  Repair cracks and edges of walkways and driveways.  Remove high doorway thresholds.  Trim shrubbery on the main path into your home.  Have good outside lighting.  Clear walkways of tools, rocks, debris, and clutter.  Check that handrails are not broken and are securely fastened. Both sides of steps should have handrails.  Have leaves, snow, and ice cleared regularly.  Use sand or salt on walkways during winter months.  In the garage, clean up grease or oil spills. BATHROOM  Install night lights.  Install grab bars by the toilet and in the tub and shower.  Use non-skid mats or decals in the tub or shower.  Place a plastic non-slip stool in the shower to sit on, if needed.  Keep floors dry and clean up all water on the floor immediately.  Remove soap buildup in the tub or shower on a regular basis.  Secure bath mats with non-slip, double-sided rug tape.  Remove throw rugs and tripping hazards from the floors. BEDROOMS  Install night lights.  Make sure a bedside light is easy to reach.  Do not use oversized bedding.  Keep a telephone by your bedside.  Have a firm chair with side arms to use for getting dressed.  Remove throw rugs and tripping hazards from the floor. KITCHEN  Keep handles on pots and pans turned toward the center of the stove. Use back burners when possible.  Clean up spills quickly and allow time for drying.  Avoid walking on wet floors.  Avoid hot utensils and knives.  Position  shelves so they are not too high or low.  Place commonly used objects within easy reach.  If necessary, use a sturdy step stool with a grab bar when reaching.  Keep electrical cables out of the way.  Do not use floor polish or wax that makes floors slippery. If you must use wax, use non-skid floor wax.  Remove throw rugs and tripping hazards from the floor. STAIRWAYS  Never leave objects on stairs.  Place handrails on both sides of stairways and use them. Fix any loose handrails. Make sure handrails on both sides of the stairways are as long as the stairs.  Check carpeting to make sure it is firmly attached along stairs. Make repairs to worn or loose carpet promptly.  Avoid placing throw rugs at the top or bottom of stairways, or properly secure the rug with carpet tape to prevent slippage. Get rid of throw rugs, if possible.  Have an electrician put in a light switch at the top and bottom of the stairs. OTHER FALL PREVENTION TIPS  Wear low-heel or rubber-soled shoes that are supportive and fit well. Wear closed toe shoes.  When using a stepladder, make sure it is fully opened and both spreaders are firmly locked. Do not climb a closed stepladder.  Add color or contrast paint or tape to grab bars and handrails in your home. Place contrasting color strips on first and last steps.  Learn and  use mobility aids as needed. Install an electrical emergency response system.  Turn on lights to avoid dark areas. Replace light bulbs that burn out immediately. Get light switches that glow.  Arrange furniture to create clear pathways. Keep furniture in the same place.  Firmly attach carpet with non-skid or double-sided tape.  Eliminate uneven floor surfaces.  Select a carpet pattern that does not visually hide the edge of steps.  Be aware of all pets. OTHER HOME SAFETY TIPS  Set the water temperature for 120 F (48.8 C).  Keep emergency numbers on or near the telephone.  Keep  smoke detectors on every level of the home and near sleeping areas. Document Released: 06/06/2002 Document Revised: 12/16/2011 Document Reviewed: 09/05/2011 [facility] Patient Information 2014 [place].

## 2013-11-30 NOTE — Assessment & Plan Note (Signed)
Due for labs

## 2013-11-30 NOTE — Progress Notes (Signed)
Subjective:    Patient ID: Monique [name], female    DOB: 06-Apr-1941, 73 y.o.   MRN: [id number]  DOS:  11/30/2013 Type of  Visit:   Here for Medicare AWV: 1. Risk factors based on Past M, S, F history: reviewed 2. Physical Activities:  Active, yard work  3. Depression/mood:  (-)  screening 4. Hearing:  No problemss noted or reported   5. ADL's:  Totally independent  , drives 6. Fall Risk: prevention discussed   7. home Safety: does feel safe at home   8. Height, weight, &visual acuity: see VS, uses glasses, has an eye doctor f/u this month   9. Counseling: provided 10. Labs ordered based on risk factors: if needed   11. Referral Coordination: if needed 12.  Care Plan, see assessment and plan   13.   Cognitive Assessment: Motor skills and cognition within normal for age  In addition, today we discussed the following: Hypertension, good medication compliance, no apparent side effects. Good compliance with calcium and vitamin D. Lost her husband recently, fortunately she is doing well emotionally, she is counseled today.  ROS No  CP, SOB no lower extremity edema Denies  nausea, vomiting diarrhea, blood in the stools (-) cough, sputum production (-) wheezing, chest congestion No dysuria, gross hematuria, difficulty urinating      Past Medical History  Diagnosis Date  . Hypertension 09/29/2006  . Cholelithiasis   . Headache(784.0)   . Anxiety   . Thrombocytopenia 08/27/2011    hematology evaluation 08-2011, return prn  . Dry eye syndrome   . DJD (degenerative joint disease)     Past Surgical History  Procedure Laterality Date  . Appendectomy    . Abdominal hysterectomy      no oophorectomy  . Tonsillectomy      History   Social History  . Marital Status: Married    Spouse Name: N/A    Number of Children: 2  . Years of Education: N/A   Occupational History  . 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 Smoking status: Never Smoker   .  Smokeless tobacco: Never Used  . Alcohol Use: No  . Drug Use: No  . Sexual Activity: Not on file   Other Topics Concern  . Not on file   Social History Narrative   husband has prostate ca , passed away Sep 20, 2013   takes care of mom, 74 y/o who is blind , and an uncle             Family History  Problem Relation Age of Onset  . Coronary artery disease Father   . Diabetes Other     uncles   . Hypertension Mother   . Prostate cancer Other     grandfather  . Breast cancer Neg Hx   . Colon cancer Neg Hx   . Anxiety disorder Mother   . Depression Mother   . Depression Maternal Uncle        Medication List       This list is accurate as of: 11/30/13 11:59 PM.  Always use your most recent med list.               aspirin 81 MG tablet  Take 81 mg by mouth daily with breakfast.     CALCIUM + D PO  Take 2 tablets by mouth daily with lunch.     carvedilol 6.25 MG tablet  Commonly known as:  COREG  TAKE 1 TABLET (6.25 MG TOTAL) BY MOUTH 2 (TWO) TIMES DAILY WITH A MEAL.     cycloSPORINE 0.05 % ophthalmic emulsion  Commonly known as:  RESTASIS  Place 1 drop into both eyes every 12 (twelve) hours.     glucosamine-chondroitin 500-400 MG tablet  Take 1 tablet by mouth 3 (three) times daily.     multivitamin with minerals Tabs tablet  Take 1 tablet by mouth daily with breakfast.     spironolactone 25 MG tablet  Commonly known as:  ALDACTONE  TAKE 1 TABLET (25 MG TOTAL) BY MOUTH EVERY MORNING.     SYSTANE ULTRA 0.4-0.3 % Soln  Generic drug:  Polyethyl Glycol-Propyl Glycol  Place 1 drop into both eyes 3 (three) times daily as needed (dry eyes).     ZYRTEC ALLERGY 10 MG tablet  Generic drug:  cetirizine  Take 10 mg by mouth daily.           Objective:   Physical Exam BP 117/75  Pulse 72  Temp(Src) 97.3 F (36.3 C)  Ht 5' 1.2" (1.554 m)  Wt 160 lb (72.576 kg)  BMI 30.05 kg/m2  SpO2 99% General -- alert, well-developed, NAD.  Neck --no thyromegaly , normal  carotid pulse  HEENT-- Not pale.  Lungs -- normal respiratory effort, no intercostal retractions, no accessory muscle use, and normal breath sounds.  Heart-- normal rate, regular rhythm, no murmur.  Abdomen-- Not distended, good bowel sounds,soft, non-tender. Extremities-- no pretibial edema bilaterally  Neurologic--  alert & oriented X3. Speech normal, gait appropriate for age, strength symmetric and appropriate for age.  Psych-- Cognition and judgment appear intact. Cooperative with normal attention span and concentration. No anxious or depressed appearing.        Assessment & Plan:

## 2013-11-30 NOTE — Assessment & Plan Note (Signed)
Td 2006 pneumonia shot 11-08 prevnar today zostavax 2007 Saw Dr Gertie Fey this year, reports reviewed:  [facility] 11-03-13 neg DEXA 10-2013 normal   Cscope 10-2005 (-) @ [facility], next  2017  Counseled--diet, exercise, ca and vit D

## 2013-12-02 ENCOUNTER — Encounter: Payer: Self-pay | Admitting: *Deleted

## 2013-12-02 NOTE — Progress Notes (Signed)
Letter sent with lab results.  

## 2014-01-16 DIAGNOSIS — E119 Type 2 diabetes mellitus without complications: Secondary | ICD-10-CM | POA: Diagnosis not present

## 2014-01-16 LAB — HM DIABETES EYE EXAM

## 2014-01-18 DIAGNOSIS — M542 Cervicalgia: Secondary | ICD-10-CM | POA: Diagnosis not present

## 2014-01-18 DIAGNOSIS — M25519 Pain in unspecified shoulder: Secondary | ICD-10-CM | POA: Diagnosis not present

## 2014-02-06 ENCOUNTER — Other Ambulatory Visit: Payer: Self-pay | Admitting: Internal Medicine

## 2014-02-10 ENCOUNTER — Telehealth: Payer: Self-pay | Admitting: Internal Medicine

## 2014-02-10 NOTE — Telephone Encounter (Signed)
Caller name: Zerenity  Relation to EN:IDPO Call back number: [phone] [phone]  Reason for call: pt is experiencing coughing off and on started yesterday taken [place] and cough medicine and allergy medicine. Feeling tired.

## 2014-02-11 ENCOUNTER — Encounter (HOSPITAL_COMMUNITY): Payer: Self-pay | Admitting: Emergency Medicine

## 2014-02-11 ENCOUNTER — Emergency Department (INDEPENDENT_AMBULATORY_CARE_PROVIDER_SITE_OTHER): Payer: Medicare Other

## 2014-02-11 ENCOUNTER — Emergency Department (INDEPENDENT_AMBULATORY_CARE_PROVIDER_SITE_OTHER)
Admission: EM | Admit: 2014-02-11 | Discharge: 2014-02-11 | Disposition: A | Discharge status: Home / Self Care | Payer: Medicare Other | Source: Home / Self Care | Attending: Family Medicine | Admitting: Family Medicine

## 2014-02-11 DIAGNOSIS — J069 Acute upper respiratory infection, unspecified: Secondary | ICD-10-CM | POA: Diagnosis not present

## 2014-02-11 DIAGNOSIS — B9789 Other viral agents as the cause of diseases classified elsewhere: Principal | ICD-10-CM

## 2014-02-11 MED ORDER — LEVOFLOXACIN 500 MG PO TABS: 500.0000 mg | ORAL_TABLET | Freq: Every day | ORAL | Status: DC

## 2014-02-11 MED ORDER — METHYLPREDNISOLONE (PAK) 4 MG PO TABS: ORAL_TABLET | ORAL | Status: DC

## 2014-02-11 MED ORDER — IPRATROPIUM BROMIDE 0.06 % NA SOLN: 2.0000 | NASAL | Status: DC | PRN

## 2014-02-11 MED ORDER — GUAIFENESIN-CODEINE 100-10 MG/5ML PO SOLN: 5.0000 mL | ORAL | Status: DC | PRN

## 2014-02-11 MED ORDER — METHYLPREDNISOLONE 4 MG PO KIT: PACK | ORAL | Status: DC

## 2014-02-11 NOTE — ED Provider Notes (Signed)
Medical screening examination/treatment/procedure(s) were performed by resident physician or non-physician practitioner and as supervising physician I was immediately available for consultation/collaboration.   Pauline Good MD.   Billy Fischer, MD 02/11/14 1534

## 2014-02-11 NOTE — ED Notes (Signed)
Pt c/o cold sx onset 3 days Sx include: cough, BA, sneezing, runny nose, congestion Denies f/v/n/d, SOB, wheezing Taking OTC cold/allergy meds w/no relief Alert; no signs of acute distress.

## 2014-02-11 NOTE — Discharge Instructions (Signed)
Use the first 3 prescription medications for now and only take the antibiotic levofloxacin if your symptoms start to worsen.  Upper Respiratory Infection, Adult An upper respiratory infection (URI) is also sometimes known as the common cold. The upper respiratory tract includes the nose, sinuses, throat, trachea, and bronchi. Bronchi are the airways leading to the lungs. Most people improve within 1 week, but symptoms can last up to 2 weeks. A residual cough may last even longer.  CAUSES Many different viruses can infect the tissues lining the upper respiratory tract. The tissues become irritated and inflamed and often become very moist. Mucus production is also common. A cold is contagious. You can easily spread the virus to others by oral contact. This includes kissing, sharing a glass, coughing, or sneezing. Touching your mouth or nose and then touching a surface, which is then touched by another person, can also spread the virus. SYMPTOMS  Symptoms typically develop 1 to 3 days after you come in contact with a cold virus. Symptoms vary from person to person. They may include:  Runny nose.  Sneezing.  Nasal congestion.  Sinus irritation.  Sore throat.  Loss of voice (laryngitis).  Cough.  Fatigue.  Muscle aches.  Loss of appetite.  Headache.  Low-grade fever. DIAGNOSIS  You might diagnose your own cold based on familiar symptoms, since most people get a cold 2 to 3 times a year. Your caregiver can confirm this based on your exam. Most importantly, your caregiver can check that your symptoms are not due to another disease such as strep throat, sinusitis, pneumonia, asthma, or epiglottitis. Blood tests, throat tests, and X-rays are not necessary to diagnose a common cold, but they may sometimes be helpful in excluding other more serious diseases. Your caregiver will decide if any further tests are required. RISKS AND COMPLICATIONS  You may be at risk for a more severe case of the  common cold if you smoke cigarettes, have chronic heart disease (such as heart failure) or lung disease (such as asthma), or if you have a weakened immune system. The very young and very old are also at risk for more serious infections. Bacterial sinusitis, middle ear infections, and bacterial pneumonia can complicate the common cold. The common cold can worsen asthma and chronic obstructive pulmonary disease (COPD). Sometimes, these complications can require emergency medical care and may be life-threatening. PREVENTION  The best way to protect against getting a cold is to practice good hygiene. Avoid oral or hand contact with people with cold symptoms. Wash your hands often if contact occurs. There is no clear evidence that vitamin C, vitamin E, echinacea, or exercise reduces the chance of developing a cold. However, it is always recommended to get plenty of rest and practice good nutrition. TREATMENT  Treatment is directed at relieving symptoms. There is no cure. Antibiotics are not effective, because the infection is caused by a virus, not by bacteria. Treatment may include:  Increased fluid intake. Sports drinks offer valuable electrolytes, sugars, and fluids.  Breathing heated mist or steam (vaporizer or shower).  Eating chicken soup or other clear broths, and maintaining good nutrition.  Getting plenty of rest.  Using gargles or lozenges for comfort.  Controlling fevers with ibuprofen or acetaminophen as directed by your caregiver.  Increasing usage of your inhaler if you have asthma. Zinc gel and zinc lozenges, taken in the first 24 hours of the common cold, can shorten the duration and lessen the severity of symptoms. Pain medicines may help with  fever, muscle aches, and throat pain. A variety of non-prescription medicines are available to treat congestion and runny nose. Your caregiver can make recommendations and may suggest nasal or lung inhalers for other symptoms.  HOME CARE  INSTRUCTIONS   Only take over-the-counter or prescription medicines for pain, discomfort, or fever as directed by your caregiver.  Use a warm mist humidifier or inhale steam from a shower to increase air moisture. This may keep secretions moist and make it easier to breathe.  Drink enough water and fluids to keep your urine clear or pale yellow.  Rest as needed.  Return to work when your temperature has returned to normal or as your caregiver advises. You may need to stay home longer to avoid infecting others. You can also use a face mask and careful hand washing to prevent spread of the virus. SEEK MEDICAL CARE IF:   After the first few days, you feel you are getting worse rather than better.  You need your caregiver's advice about medicines to control symptoms.  You develop chills, worsening shortness of breath, or brown or red sputum. These may be signs of pneumonia.  You develop yellow or brown nasal discharge or pain in the face, especially when you bend forward. These may be signs of sinusitis.  You develop a fever, swollen neck glands, pain with swallowing, or white areas in the back of your throat. These may be signs of strep throat. SEEK IMMEDIATE MEDICAL CARE IF:   You have a fever.  You develop severe or persistent headache, ear pain, sinus pain, or chest pain.  You develop wheezing, a prolonged cough, cough up blood, or have a change in your usual mucus (if you have chronic lung disease).  You develop sore muscles or a stiff neck. Document Released: 12/10/2000 Document Revised: 09/08/2011 Document Reviewed: 09/21/2013 [facility] Patient Information 2015 [place], [place]. This information is not intended to replace advice given to you by your health care provider. Make sure you discuss any questions you have with your health care provider.

## 2014-02-11 NOTE — ED Provider Notes (Signed)
CSN: [id number]     Arrival date & time 02/11/14  1256 History   First MD Initiated Contact with Patient 02/11/14 1320     Chief Complaint  Patient presents with  . URI   (Consider location/radiation/quality/duration/timing/severity/associated sxs/prior Treatment) HPI Comments: 73 year old female presents complaining of onset of cough, chest pain with coughing, body aches, sneezing, runny nose, nasal congestion for the past 3 days. These symptoms started gradually and were getting worse until yesterday, they now seem to be getting a little bit better. She is taking over-the-counter medications with very minimal relief of her symptoms. She denies any pain in her chest when not coughing, as well as no shortness of breath, nausea, vomiting. No recent travel or sick contacts.  Patient is a 73 y.o. female presenting with URI.  URI Presenting symptoms: congestion, cough, fatigue, rhinorrhea and sore throat   Presenting symptoms: no ear pain and no fever     Past Medical History  Diagnosis Date  . Hypertension 09/29/2006  . Cholelithiasis   . Headache(784.0)   . Anxiety   . Thrombocytopenia 08/27/2011    hematology evaluation 08-2011, return prn  . Dry eye syndrome   . DJD (degenerative joint disease)    Past Surgical History  Procedure Laterality Date  . Appendectomy    . Abdominal hysterectomy      no oophorectomy  . Tonsillectomy     Family History  Problem Relation Age of Onset  . Coronary artery disease Father   . Diabetes Other     uncles   . Hypertension Mother   . Prostate cancer Other     grandfather  . Breast cancer Neg Hx   . Colon cancer Neg Hx   . Anxiety disorder Mother   . Depression Mother   . Depression Maternal Uncle    History  Substance Use Topics  . Smoking status: Never Smoker   . Smokeless tobacco: Never Used  . Alcohol Use: No   OB History   Grav Para Term Preterm Abortions TAB SAB Ect Mult Living                 Review of Systems   Constitutional: Positive for fatigue. Negative for fever and chills.  HENT: Positive for congestion, postnasal drip, rhinorrhea and sore throat. Negative for ear pain, facial swelling and sinus pressure.   Respiratory: Positive for cough. Negative for chest tightness and shortness of breath.   Cardiovascular: Positive for chest pain (with coughing only). Negative for palpitations.  All other systems reviewed and are negative.   Allergies  Bee venom; Doxycycline; Penicillins; Azithromycin; Cerumenex; and Climara  Home Medications   Prior to Admission medications   Medication Sig Start Date End Date Taking? Authorizing Provider  aspirin 81 MG tablet Take 81 mg by mouth daily with breakfast.    Yes Historical Provider, MD  Calcium-Vitamin D (CALCIUM + D PO) Take 2 tablets by mouth daily with lunch.    Yes Historical Provider, MD  carvedilol (COREG) 6.25 MG tablet TAKE 1 TABLET (6.25 MG TOTAL) BY MOUTH 2 (TWO) TIMES DAILY WITH A MEAL.   Yes Colon Branch, MD  meloxicam (MOBIC) 15 MG tablet Take 15 mg by mouth daily.   Yes Historical Provider, MD  spironolactone (ALDACTONE) 25 MG tablet TAKE 1 TABLET (25 MG TOTAL) BY MOUTH EVERY MORNING.   Yes Colon Branch, MD  cetirizine (ZYRTEC ALLERGY) 10 MG tablet Take 10 mg by mouth daily.     Historical Provider, MD  cycloSPORINE (RESTASIS) 0.05 % ophthalmic emulsion Place 1 drop into both eyes every 12 (twelve) hours.     Historical Provider, MD  glucosamine-chondroitin 500-400 MG tablet Take 1 tablet by mouth 3 (three) times daily.      Historical Provider, MD  guaiFENesin-codeine 100-10 MG/5ML syrup Take 5 mLs by mouth every 4 (four) hours as needed for cough. 02/11/14   Freeman Caldron Ceferino Lang, PA-C  ipratropium (ATROVENT) 0.06 % nasal spray Place 2 sprays into both nostrils every 4 (four) hours as needed for rhinitis. 02/11/14   Liam Graham, PA-C  levofloxacin (LEVAQUIN) 500 MG tablet Take 1 tablet (500 mg total) by mouth daily. 02/11/14   Liam Graham, PA-C   methylPREDNIsolone (MEDROL DOSPACK) 4 MG tablet follow package directions 02/11/14   Liam Graham, PA-C  Multiple Vitamin (MULTIVITAMIN WITH MINERALS) TABS Take 1 tablet by mouth daily with breakfast.    Historical Provider, MD  Polyethyl Glycol-Propyl Glycol (SYSTANE ULTRA) 0.4-0.3 % SOLN Place 1 drop into both eyes 3 (three) times daily as needed (dry eyes).     Historical Provider, MD   BP 136/85  Pulse 103  Temp(Src) 99.1 F (37.3 C) (Oral)  Resp 18  SpO2 95% Physical Exam  Nursing note and vitals reviewed. Constitutional: She is oriented to person, place, and time. Vital signs are normal. She appears well-developed and well-nourished. No distress.  HENT:  Head: Normocephalic and atraumatic.  Right Ear: Tympanic membrane, external ear and ear canal normal.  Left Ear: Tympanic membrane, external ear and ear canal normal.  Nose: Nose normal. Right sinus exhibits no maxillary sinus tenderness and no frontal sinus tenderness. Left sinus exhibits no maxillary sinus tenderness and no frontal sinus tenderness.  Mouth/Throat: Uvula is midline, oropharynx is clear and moist and mucous membranes are normal. No oropharyngeal exudate.  Eyes: Conjunctivae are normal. Right eye exhibits no discharge. Left eye exhibits no discharge.  Neck: Normal range of motion. Neck supple.  Cardiovascular: Normal rate, regular rhythm and normal heart sounds.   Pulmonary/Chest: Effort normal and breath sounds normal. No respiratory distress. She has no wheezes. She has no rales. She exhibits no tenderness.  Abdominal: Soft. There is no tenderness.  Lymphadenopathy:    She has no cervical adenopathy.  Neurological: She is alert and oriented to person, place, and time. She has normal strength. Coordination normal.  Skin: Skin is warm and dry. No rash noted. She is not diaphoretic.  Psychiatric: She has a normal mood and affect. Judgment normal.    ED Course  Procedures (including critical care time) Labs  Review Labs Reviewed - No data to display  Imaging Review Dg Chest 2 View  02/11/2014   CLINICAL DATA:  Upper respiratory tract infection.  EXAM: CHEST  2 VIEW  COMPARISON:  PA and lateral chest 12/24/2011 and 06/19/2009.  FINDINGS: Lung volumes are low with some streaky basilar atelectasis. No pneumothorax or pleural effusion. Heart size is normal. No focal bony abnormality.  IMPRESSION: No acute finding in a low volume chest.   Electronically Signed   By: Inge Rise M.D.   On: 02/11/2014 14:36     MDM   1. Viral URI with cough    CXR normal.  Treat symptomatically, post-dated Rx for ABx to take if no improvement in a few days, or if worsening.  F/u as needed    Meds ordered this encounter  Medications  . meloxicam (MOBIC) 15 MG tablet    Sig: Take 15 mg by mouth daily.  Marland Kitchen  DISCONTD: ipratropium (ATROVENT) 0.06 % nasal spray    Sig: Place 2 sprays into both nostrils every 4 (four) hours as needed for rhinitis.    Dispense:  15 mL    Refill:  1    Order Specific Question:  Supervising Provider    Answer:  Ihor Gully D [id number]  . DISCONTD: guaiFENesin-codeine 100-10 MG/5ML syrup    Sig: Take 5 mLs by mouth every 4 (four) hours as needed for cough.    Dispense:  120 mL    Refill:  0    Order Specific Question:  Supervising Provider    Answer:  Billy Fischer [phone]  . DISCONTD: methylPREDNISolone (MEDROL DOSEPAK) 4 MG tablet    Sig: Use as directed on package instructions    Dispense:  21 tablet    Refill:  0    Order Specific Question:  Supervising Provider    Answer:  Billy Fischer [phone]  . methylPREDNIsolone (MEDROL DOSPACK) 4 MG tablet    Sig: follow package directions    Dispense:  21 tablet    Refill:  0    Order Specific Question:  Supervising Provider    Answer:  Billy Fischer [phone]  . guaiFENesin-codeine 100-10 MG/5ML syrup    Sig: Take 5 mLs by mouth every 4 (four) hours as needed for cough.    Dispense:  120 mL    Refill:  0    Order Specific  Question:  Supervising Provider    Answer:  Billy Fischer [phone]  . ipratropium (ATROVENT) 0.06 % nasal spray    Sig: Place 2 sprays into both nostrils every 4 (four) hours as needed for rhinitis.    Dispense:  15 mL    Refill:  1    Order Specific Question:  Supervising Provider    Answer:  Ihor Gully D [id number]  . levofloxacin (LEVAQUIN) 500 MG tablet    Sig: Take 1 tablet (500 mg total) by mouth daily.    Dispense:  5 tablet    Refill:  0    Order Specific Question:  Supervising Provider    Answer:  Ihor Gully D [place], PA-C 02/11/14 [phone]

## 2014-02-17 NOTE — Telephone Encounter (Signed)
Pt was seen by urgent care was given antibiotics. Pt advised to pick up her antibiotic that was prescribed and complete the entire course if no better or getting worse to follow-up with PCP. Pt scheduled for a hosp-follow up @ DR.PAZ 02/23/14

## 2014-02-23 ENCOUNTER — Ambulatory Visit (INDEPENDENT_AMBULATORY_CARE_PROVIDER_SITE_OTHER): Payer: Medicare Other | Admitting: Internal Medicine

## 2014-02-23 ENCOUNTER — Encounter: Payer: Self-pay | Admitting: Internal Medicine

## 2014-02-23 VITALS — BP 140/76 | HR 79 | Temp 98.3°F | Wt 164.1 lb

## 2014-02-23 DIAGNOSIS — B349 Viral infection, unspecified: Secondary | ICD-10-CM

## 2014-02-23 DIAGNOSIS — B9789 Other viral agents as the cause of diseases classified elsewhere: Secondary | ICD-10-CM

## 2014-02-23 NOTE — Patient Instructions (Signed)
Rest, fluids , tylenol For cough, take Mucinex DM twice a day as needed  Syrup with codeine if the cough is severe  Call if no gradually better in few days

## 2014-02-23 NOTE — Progress Notes (Signed)
Subjective:    Patient ID: Monique [name], female    DOB: May 07, 1941, 73 y.o.   MRN: [id number]  DOS:  02/23/2014 Type of visit - description: er f/u History: .Went to the ER with a viral syndrome 02/11/2014, chest x-ray was negative. She was prescribed prednisone and Levaquin. Here  for a followup  ROS Overall better, no further chills, no fevers. Minimal  sinus congestion. Currently not coughing or producing sputum. Still feeling tired, lost husband Sep 11, 2013, denies anxiety-depression  Past Medical History  Diagnosis Date  . Hypertension 09/29/2006  . Cholelithiasis   . Headache(784.0)   . Anxiety   . Thrombocytopenia 08/27/2011    hematology evaluation 08-2011, return prn  . Dry eye syndrome   . DJD (degenerative joint disease)     Past Surgical History  Procedure Laterality Date  . Appendectomy    . Abdominal hysterectomy      no oophorectomy  . Tonsillectomy      History   Social History  . Marital Status: Married    Spouse Name: N/A    Number of Children: 2  . Years of Education: N/A   Occupational History  . 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 Smoking status: Never Smoker   . Smokeless tobacco: Never Used  . Alcohol Use: No  . Drug Use: No  . Sexual Activity: Not on file   Other Topics Concern  . Not on file   Social History Narrative   husband has prostate ca , passed away September 11, 2013   takes care of mom, 47 y/o who is blind , and an uncle                Medication List       This list is accurate as of: 02/23/14 10:04 AM.  Always use your most recent med list.               aspirin 81 MG tablet  Take 81 mg by mouth daily with breakfast.     CALCIUM + D PO  Take 2 tablets by mouth daily with lunch.     carvedilol 6.25 MG tablet  Commonly known as:  COREG  TAKE 1 TABLET (6.25 MG TOTAL) BY MOUTH 2 (TWO) TIMES DAILY WITH A MEAL.     cycloSPORINE 0.05 % ophthalmic emulsion  Commonly known as:  RESTASIS    Place 1 drop into both eyes every 12 (twelve) hours.     glucosamine-chondroitin 500-400 MG tablet  Take 1 tablet by mouth 3 (three) times daily.     guaiFENesin-codeine 100-10 MG/5ML syrup  Take 5 mLs by mouth every 4 (four) hours as needed for cough.     ipratropium 0.06 % nasal spray  Commonly known as:  ATROVENT  Place 2 sprays into both nostrils every 4 (four) hours as needed for rhinitis.     multivitamin with minerals Tabs tablet  Take 1 tablet by mouth daily with breakfast.     spironolactone 25 MG tablet  Commonly known as:  ALDACTONE  TAKE 1 TABLET (25 MG TOTAL) BY MOUTH EVERY MORNING.     SYSTANE ULTRA 0.4-0.3 % Soln  Generic drug:  Polyethyl Glycol-Propyl Glycol  Place 1 drop into both eyes 3 (three) times daily as needed (dry eyes).     ZYRTEC ALLERGY 10 MG tablet  Generic drug:  cetirizine  Take 10 mg by mouth daily.  Objective:   Physical Exam BP 140/76  Pulse 79  Temp(Src) 98.3 F (36.8 C) (Oral)  Wt 164 lb 2 oz (74.447 kg)  SpO2 98%  General -- alert, well-developed, NAD.  HEENT--   Face symmetric, sinuses not tender to palpation. Nose not congested. Lungs -- normal respiratory effort, no intercostal retractions, no accessory muscle use, and normal breath sounds.  Heart-- normal rate, regular rhythm, no murmur.   Extremities-- no pretibial edema bilaterally  Neurologic--  alert & oriented X3. Psych-- Cognition and judgment appear intact. Cooperative with normal attention span and concentration. No anxious or depressed appearing.         Assessment & Plan:   Vital syndrome, recovering, see instructions

## 2014-02-23 NOTE — Progress Notes (Signed)
Pre-visit discussion using our clinic review tool. No additional management support is needed unless otherwise documented below in the visit note.  

## 2014-03-01 DIAGNOSIS — M542 Cervicalgia: Secondary | ICD-10-CM | POA: Diagnosis not present

## 2014-04-27 ENCOUNTER — Telehealth: Payer: Self-pay | Admitting: Internal Medicine

## 2014-04-27 MED ORDER — CARVEDILOL 6.25 MG PO TABS: ORAL_TABLET | ORAL | Status: DC

## 2014-04-27 MED ORDER — SPIRONOLACTONE 25 MG PO TABS: ORAL_TABLET | ORAL | Status: DC

## 2014-04-27 NOTE — Telephone Encounter (Signed)
Caller name: [place]  Call back [place]: [facility] [address]  Reason for call:  Pt is needing these refilled with 90 day supply carvedilol (COREG) 6.25 MG tablet  spironolactone (ALDACTONE) 25 MG tablet

## 2014-04-27 NOTE — Telephone Encounter (Signed)
Med[facility] as requested.

## 2014-05-02 DIAGNOSIS — Z23 Encounter for immunization: Secondary | ICD-10-CM | POA: Diagnosis not present

## 2014-05-13 ENCOUNTER — Emergency Department (HOSPITAL_COMMUNITY)
Admission: EM | Admit: 2014-05-13 | Discharge: 2014-05-13 | Disposition: A | Discharge status: Home / Self Care | Payer: Medicare Other | Attending: Emergency Medicine | Admitting: Emergency Medicine

## 2014-05-13 ENCOUNTER — Emergency Department (HOSPITAL_COMMUNITY): Payer: Medicare Other

## 2014-05-13 ENCOUNTER — Encounter (HOSPITAL_COMMUNITY): Payer: Self-pay

## 2014-05-13 DIAGNOSIS — Y998 Other external cause status: Secondary | ICD-10-CM | POA: Diagnosis not present

## 2014-05-13 DIAGNOSIS — Z8719 Personal history of other diseases of the digestive system: Secondary | ICD-10-CM | POA: Diagnosis not present

## 2014-05-13 DIAGNOSIS — Z880 Allergy status to penicillin: Secondary | ICD-10-CM | POA: Insufficient documentation

## 2014-05-13 DIAGNOSIS — S0990XA Unspecified injury of head, initial encounter: Secondary | ICD-10-CM | POA: Insufficient documentation

## 2014-05-13 DIAGNOSIS — Z79899 Other long term (current) drug therapy: Secondary | ICD-10-CM | POA: Insufficient documentation

## 2014-05-13 DIAGNOSIS — Z862 Personal history of diseases of the blood and blood-forming organs and certain disorders involving the immune mechanism: Secondary | ICD-10-CM | POA: Diagnosis not present

## 2014-05-13 DIAGNOSIS — F419 Anxiety disorder, unspecified: Secondary | ICD-10-CM | POA: Diagnosis not present

## 2014-05-13 DIAGNOSIS — Y9389 Activity, other specified: Secondary | ICD-10-CM | POA: Diagnosis not present

## 2014-05-13 DIAGNOSIS — H04129 Dry eye syndrome of unspecified lacrimal gland: Secondary | ICD-10-CM | POA: Diagnosis not present

## 2014-05-13 DIAGNOSIS — R51 Headache: Secondary | ICD-10-CM | POA: Diagnosis not present

## 2014-05-13 DIAGNOSIS — I10 Essential (primary) hypertension: Secondary | ICD-10-CM | POA: Diagnosis not present

## 2014-05-13 DIAGNOSIS — M1990 Unspecified osteoarthritis, unspecified site: Secondary | ICD-10-CM | POA: Insufficient documentation

## 2014-05-13 DIAGNOSIS — M542 Cervicalgia: Secondary | ICD-10-CM | POA: Diagnosis not present

## 2014-05-13 DIAGNOSIS — R519 Headache, unspecified: Secondary | ICD-10-CM

## 2014-05-13 DIAGNOSIS — Y92410 Unspecified street and highway as the place of occurrence of the external cause: Secondary | ICD-10-CM | POA: Insufficient documentation

## 2014-05-13 DIAGNOSIS — Z7982 Long term (current) use of aspirin: Secondary | ICD-10-CM | POA: Diagnosis not present

## 2014-05-13 DIAGNOSIS — R42 Dizziness and giddiness: Secondary | ICD-10-CM | POA: Diagnosis not present

## 2014-05-13 MED ORDER — DIAZEPAM 5 MG PO TABS: 5.0000 mg | ORAL_TABLET | Freq: Two times a day (BID) | ORAL | Status: DC | PRN

## 2014-05-13 MED ORDER — ACETAMINOPHEN 325 MG PO TABS
650.0000 mg | ORAL_TABLET | Freq: Once | ORAL | Status: AC
  Administered 2014-05-13: 650 mg via ORAL
  Filled 2014-05-13: qty 2
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
## 2014-05-13 NOTE — ED Notes (Signed)
Pt. Reports HA with dizziness, denies hitting her head. Also reports back pain. Alert and oriented x4.
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
## 2014-05-13 NOTE — ED Notes (Signed)
Pt off floor to CT

## 2014-05-19 ENCOUNTER — Other Ambulatory Visit: Payer: Self-pay

## 2014-05-29 ENCOUNTER — Other Ambulatory Visit: Payer: Self-pay

## 2014-06-01 ENCOUNTER — Ambulatory Visit: Payer: Medicare Other | Admitting: Internal Medicine

## 2014-06-01 ENCOUNTER — Encounter (HOSPITAL_COMMUNITY): Payer: Self-pay | Admitting: *Deleted

## 2014-06-01 ENCOUNTER — Emergency Department (INDEPENDENT_AMBULATORY_CARE_PROVIDER_SITE_OTHER)
Admission: EM | Admit: 2014-06-01 | Discharge: 2014-06-01 | Disposition: A | Discharge status: Home / Self Care | Payer: Medicare Other | Source: Home / Self Care | Attending: Family Medicine | Admitting: Family Medicine

## 2014-06-01 DIAGNOSIS — M542 Cervicalgia: Secondary | ICD-10-CM | POA: Diagnosis not present

## 2014-06-01 DIAGNOSIS — L853 Xerosis cutis: Secondary | ICD-10-CM

## 2014-06-01 DIAGNOSIS — L259 Unspecified contact dermatitis, unspecified cause: Secondary | ICD-10-CM | POA: Diagnosis not present

## 2014-06-01 DIAGNOSIS — L850 Acquired ichthyosis: Secondary | ICD-10-CM

## 2014-06-01 MED ORDER — MELOXICAM 7.5 MG PO TABS: 7.5000 mg | ORAL_TABLET | Freq: Every day | ORAL | Status: DC

## 2014-06-01 MED ORDER — TRAMADOL HCL 50 MG PO TABS: 50.0000 mg | ORAL_TABLET | Freq: Four times a day (QID) | ORAL | Status: DC | PRN

## 2014-06-01 NOTE — ED Notes (Signed)
pT  REPORTS  SYMPTOMS OF  ITCHING   AND       -   EXTREMITYS  AND  TORSO BUT  NOT  THE     HANDS OR  FEETS         She  Also    Reports  Some  Neck pain  As  Well    But  denys  A  specefic  injury
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
## 2014-06-01 NOTE — Discharge Instructions (Signed)
Cervical Sprain A cervical sprain is an injury in the neck in which the strong, fibrous tissues (ligaments) that connect your neck bones stretch or tear. Cervical sprains can range from mild to severe. Severe cervical sprains can cause the neck vertebrae to be unstable. This can lead to damage of the spinal cord and can result in serious nervous system problems. The amount of time it takes for a cervical sprain to get better depends on the cause and extent of the injury. Most cervical sprains heal in 1 to 3 weeks. CAUSES  Severe cervical sprains may be caused by:   Contact sport injuries (such as from football, rugby, wrestling, hockey, auto racing, gymnastics, diving, martial arts, or boxing).   Motor vehicle collisions.   Whiplash injuries. This is an injury from a sudden forward and backward whipping movement of the head and neck.  Falls.  Mild cervical sprains may be caused by:   Being in an awkward position, such as while cradling a telephone between your ear and shoulder.   Sitting in a chair that does not offer proper support.   Working at a poorly Landscape architect station.   Looking up or down for long periods of time.  SYMPTOMS   Pain, soreness, stiffness, or a burning sensation in the front, back, or sides of the neck. This discomfort may develop immediately after the injury or slowly, 24 hours or more after the injury.   Pain or tenderness directly in the middle of the back of the neck.   Shoulder or upper back pain.   Limited ability to move the neck.   Headache.   Dizziness.   Weakness, numbness, or tingling in the hands or arms.   Muscle spasms.   Difficulty swallowing or chewing.   Tenderness and swelling of the neck.  DIAGNOSIS  Most of the time your health care provider can diagnose a cervical sprain by taking your history and doing a physical exam. Your health care provider will ask about previous neck injuries and any known neck  problems, such as arthritis in the neck. X-rays may be taken to find out if there are any other problems, such as with the bones of the neck. Other tests, such as a CT scan or MRI, may also be needed.  TREATMENT  Treatment depends on the severity of the cervical sprain. Mild sprains can be treated with rest, keeping the neck in place (immobilization), and pain medicines. Severe cervical sprains are immediately immobilized. Further treatment is done to help with pain, muscle spasms, and other symptoms and may include:  Medicines, such as pain relievers, numbing medicines, or muscle relaxants.   Physical therapy. This may involve stretching exercises, strengthening exercises, and posture training. Exercises and improved posture can help stabilize the neck, strengthen muscles, and help stop symptoms from returning.  HOME CARE INSTRUCTIONS   Put ice on the injured area.   Put ice in a plastic bag.   Place a towel between your skin and the bag.   Leave the ice on for 15-20 minutes, 3-4 times a day.   If your injury was severe, you may have been given a cervical collar to wear. A cervical collar is a two-piece collar designed to keep your neck from moving while it heals.  Do not remove the collar unless instructed by your health care provider.  If you have long hair, keep it outside of the collar.  Ask your health care provider before making any adjustments to your collar. Minor  adjustments may be required over time to improve comfort and reduce pressure on your chin or on the back of your head.  Ifyou are allowed to remove the collar for cleaning or bathing, follow your health care provider's instructions on how to do so safely.  Keep your collar clean by wiping it with mild soap and water and drying it completely. If the collar you have been given includes removable pads, remove them every 1-2 days and hand wash them with soap and water. Allow them to air dry. They should be completely  dry before you wear them in the collar.  If you are allowed to remove the collar for cleaning and bathing, wash and dry the skin of your neck. Check your skin for irritation or sores. If you see any, tell your health care provider.  Do not drive while wearing the collar.   Only take over-the-counter or prescription medicines for pain, discomfort, or fever as directed by your health care provider.   Keep all follow-up appointments as directed by your health care provider.   Keep all physical therapy appointments as directed by your health care provider.   Make any needed adjustments to your workstation to promote good posture.   Avoid positions and activities that make your symptoms worse.   Warm up and stretch before being active to help prevent problems.  SEEK MEDICAL CARE IF:   Your pain is not controlled with medicine.   You are unable to decrease your pain medicine over time as planned.   Your activity level is not improving as expected.  SEEK IMMEDIATE MEDICAL CARE IF:   You develop any bleeding.  You develop stomach upset.  You have signs of an allergic reaction to your medicine.   Your symptoms get worse.   You develop new, unexplained symptoms.   You have numbness, tingling, weakness, or paralysis in any part of your body.  MAKE SURE YOU:   Understand these instructions.  Will watch your condition.  Will get help right away if you are not doing well or get worse. Document Released: 04/13/2007 Document Revised: 06/21/2013 Document Reviewed: 12/22/2012 [facility] Patient Information 2015 [place], [place]. This information is not intended to replace advice given to you by your health care provider. Make sure you discuss any questions you have with your health care provider.  Contact Dermatitis Contact dermatitis is a reaction to certain substances that touch the skin. Contact dermatitis can be either irritant contact dermatitis or allergic contact  dermatitis. Irritant contact dermatitis does not require previous exposure to the substance for a reaction to occur.Allergic contact dermatitis only occurs if you have been exposed to the substance before. Upon a repeat exposure, your body reacts to the substance.  CAUSES  Many substances can cause contact dermatitis. Irritant dermatitis is most commonly caused by repeated exposure to mildly irritating substances, such as:  Makeup.  Soaps.  Detergents.  Bleaches.  Acids.  Metal salts, such as nickel. Allergic contact dermatitis is most commonly caused by exposure to:  Poisonous plants.  Chemicals (deodorants, shampoos).  Jewelry.  Latex.  Neomycin in triple antibiotic cream.  Preservatives in products, including clothing. SYMPTOMS  The area of skin that is exposed may develop:  Dryness or flaking.  Redness.  Cracks.  Itching.  Pain or a burning sensation.  Blisters. With allergic contact dermatitis, there may also be swelling in areas such as the eyelids, mouth, or genitals.  DIAGNOSIS  Your caregiver can usually tell what the problem is by  doing a physical exam. In cases where the cause is uncertain and an allergic contact dermatitis is suspected, a patch skin test may be performed to help determine the cause of your dermatitis. TREATMENT Treatment includes protecting the skin from further contact with the irritating substance by avoiding that substance if possible. Barrier creams, powders, and gloves may be helpful. Your caregiver may also recommend:  Steroid creams or ointments applied 2 times daily. For best results, soak the rash area in cool water for 20 minutes. Then apply the medicine. Cover the area with a plastic wrap. You can store the steroid cream in the refrigerator for a "chilly" effect on your rash. That may decrease itching. Oral steroid medicines may be needed in more severe cases.  Antibiotics or antibacterial ointments if a skin infection is  present.  Antihistamine lotion or an antihistamine taken by mouth to ease itching.  Lubricants to keep moisture in your skin.  Burow's solution to reduce redness and soreness or to dry a weeping rash. Mix one packet or tablet of solution in 2 cups cool water. Dip a clean washcloth in the mixture, wring it out a bit, and put it on the affected area. Leave the cloth in place for 30 minutes. Do this as often as possible throughout the day.  Taking several cornstarch or baking soda baths daily if the area is too large to cover with a washcloth. Harsh chemicals, such as alkalis or acids, can cause skin damage that is like a burn. You should flush your skin for 15 to 20 minutes with cold water after such an exposure. You should also seek immediate medical care after exposure. Bandages (dressings), antibiotics, and pain medicine may be needed for severely irritated skin.  HOME CARE INSTRUCTIONS  Avoid the substance that caused your reaction.  Keep the area of skin that is affected away from hot water, soap, sunlight, chemicals, acidic substances, or anything else that would irritate your skin.  Do not scratch the rash. Scratching may cause the rash to become infected.  You may take cool baths to help stop the itching.  Only take over-the-counter or prescription medicines as directed by your caregiver.  See your caregiver for follow-up care as directed to make sure your skin is healing properly. SEEK MEDICAL CARE IF:   Your condition is not better after 3 days of treatment.  You seem to be getting worse.  You see signs of infection such as swelling, tenderness, redness, soreness, or warmth in the affected area.  You have any problems related to your medicines. Document Released: 06/13/2000 Document Revised: 09/08/2011 Document Reviewed: 11/19/2010 [facility] Patient Information 2015 [place], [place]. This information is not intended to replace advice given to you by your health care provider.  Make sure you discuss any questions you have with your health care provider.  Rash A rash is a change in the color or texture of your skin. There are many different types of rashes. You may have other problems that accompany your rash. CAUSES   Infections.  Allergic reactions. This can include allergies to pets or foods.  Certain medicines.  Exposure to certain chemicals, soaps, or cosmetics.  Heat.  Exposure to poisonous plants.  Tumors, both cancerous and noncancerous. SYMPTOMS   Redness.  Scaly skin.  Itchy skin.  Dry or cracked skin.  Bumps.  Blisters.  Pain. DIAGNOSIS  Your caregiver may do a physical exam to determine what type of rash you have. A skin sample (biopsy) may be taken and  examined under a microscope. TREATMENT  Treatment depends on the type of rash you have. Your caregiver may prescribe certain medicines. For serious conditions, you may need to see a skin doctor (dermatologist). HOME CARE INSTRUCTIONS   Avoid the substance that caused your rash.  Do not scratch your rash. This can cause infection.  You may take cool baths to help stop itching.  Only take over-the-counter or prescription medicines as directed by your caregiver.  Keep all follow-up appointments as directed by your caregiver. SEEK IMMEDIATE MEDICAL CARE IF:  You have increasing pain, swelling, or redness.  You have a fever.  You have new or severe symptoms.  You have body aches, diarrhea, or vomiting.  Your rash is not better after 3 days. MAKE SURE YOU:  Understand these instructions.  Will watch your condition.  Will get help right away if you are not doing well or get worse. Document Released: 06/06/2002 Document Revised: 09/08/2011 Document Reviewed: 03/31/2011 [facility] Patient Information 2015 [place], [place]. This information is not intended to replace advice given to you by your health care provider. Make sure you discuss any questions you have with your  health care provider.

## 2014-06-13 ENCOUNTER — Encounter: Payer: Self-pay | Admitting: Internal Medicine

## 2014-06-13 ENCOUNTER — Ambulatory Visit (INDEPENDENT_AMBULATORY_CARE_PROVIDER_SITE_OTHER): Payer: Medicare Other | Admitting: Internal Medicine

## 2014-06-13 VITALS — BP 132/74 | HR 72 | Temp 97.5°F | Wt 166.1 lb

## 2014-06-13 DIAGNOSIS — M542 Cervicalgia: Secondary | ICD-10-CM | POA: Diagnosis not present

## 2014-06-13 DIAGNOSIS — I10 Essential (primary) hypertension: Secondary | ICD-10-CM | POA: Diagnosis not present

## 2014-06-13 NOTE — Patient Instructions (Signed)
Get your blood work before you leave    Please come back to the office by 11-2014  for a physical exam. Come back fasting      Check the  blood pressure 2 or 3 times a   Week  Be sure your blood pressure is between  145/85  and 110/65.  if it is consistently higher or lower, let me know
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
## 2014-06-13 NOTE — Progress Notes (Signed)
Pre visit review using our clinic review tool, if applicable. No additional management support is needed unless otherwise documented below in the visit note. 

## 2014-06-14 NOTE — Assessment & Plan Note (Signed)
Hypertension, continue with carvedilol, spironolactone. Check a BMP. BP initially slightly elevated, recheck BP and was 132/74.

## 2014-06-15 LAB — BASIC METABOLIC PANEL WITH GFR
BUN: 18 mg/dL (ref 6–23)
CO2: 25 meq/L (ref 19–32)
Calcium: 9.6 mg/dL (ref 8.4–10.5)
Chloride: 108 meq/L (ref 96–112)
Creatinine, Ser: 1 mg/dL (ref 0.4–1.2)
GFR: 69.71 mL/min
Glucose, Bld: 68 mg/dL — ABNORMAL LOW (ref 70–99)
Potassium: 4.6 meq/L (ref 3.5–5.1)
Sodium: 141 meq/L (ref 135–145)

## 2014-06-18 ENCOUNTER — Emergency Department (HOSPITAL_COMMUNITY)
Admission: EM | Admit: 2014-06-18 | Discharge: 2014-06-19 | Disposition: A | Discharge status: Home / Self Care | Payer: Medicare Other | Attending: Emergency Medicine | Admitting: Emergency Medicine

## 2014-06-18 ENCOUNTER — Encounter (HOSPITAL_COMMUNITY): Payer: Self-pay | Admitting: Emergency Medicine

## 2014-06-18 DIAGNOSIS — Z8669 Personal history of other diseases of the nervous system and sense organs: Secondary | ICD-10-CM | POA: Diagnosis not present

## 2014-06-18 DIAGNOSIS — Z8739 Personal history of other diseases of the musculoskeletal system and connective tissue: Secondary | ICD-10-CM | POA: Diagnosis not present

## 2014-06-18 DIAGNOSIS — L500 Allergic urticaria: Secondary | ICD-10-CM | POA: Diagnosis not present

## 2014-06-18 DIAGNOSIS — Y9289 Other specified places as the place of occurrence of the external cause: Secondary | ICD-10-CM | POA: Diagnosis not present

## 2014-06-18 DIAGNOSIS — L509 Urticaria, unspecified: Secondary | ICD-10-CM

## 2014-06-18 DIAGNOSIS — Y9389 Activity, other specified: Secondary | ICD-10-CM | POA: Diagnosis not present

## 2014-06-18 DIAGNOSIS — L230 Allergic contact dermatitis due to metals: Secondary | ICD-10-CM | POA: Insufficient documentation

## 2014-06-18 DIAGNOSIS — Z862 Personal history of diseases of the blood and blood-forming organs and certain disorders involving the immune mechanism: Secondary | ICD-10-CM | POA: Insufficient documentation

## 2014-06-18 DIAGNOSIS — Z9109 Other allergy status, other than to drugs and biological substances: Secondary | ICD-10-CM

## 2014-06-18 DIAGNOSIS — Z7982 Long term (current) use of aspirin: Secondary | ICD-10-CM | POA: Diagnosis not present

## 2014-06-18 DIAGNOSIS — I10 Essential (primary) hypertension: Secondary | ICD-10-CM | POA: Diagnosis not present

## 2014-06-18 DIAGNOSIS — X58XXXA Exposure to other specified factors, initial encounter: Secondary | ICD-10-CM | POA: Diagnosis not present

## 2014-06-18 DIAGNOSIS — T7840XA Allergy, unspecified, initial encounter: Secondary | ICD-10-CM | POA: Diagnosis present

## 2014-06-18 DIAGNOSIS — Y998 Other external cause status: Secondary | ICD-10-CM | POA: Diagnosis not present

## 2014-06-18 DIAGNOSIS — Z79899 Other long term (current) drug therapy: Secondary | ICD-10-CM | POA: Insufficient documentation

## 2014-06-18 DIAGNOSIS — F419 Anxiety disorder, unspecified: Secondary | ICD-10-CM | POA: Insufficient documentation

## 2014-06-18 DIAGNOSIS — Z880 Allergy status to penicillin: Secondary | ICD-10-CM | POA: Insufficient documentation

## 2014-06-18 DIAGNOSIS — Z8709 Personal history of other diseases of the respiratory system: Secondary | ICD-10-CM | POA: Insufficient documentation

## 2014-06-18 DIAGNOSIS — Z791 Long term (current) use of non-steroidal anti-inflammatories (NSAID): Secondary | ICD-10-CM | POA: Diagnosis not present

## 2014-06-18 DIAGNOSIS — Z7951 Long term (current) use of inhaled steroids: Secondary | ICD-10-CM | POA: Diagnosis not present

## 2014-06-18 NOTE — ED Notes (Signed)
Pt presents with hives and itching to entire body. Pt states this has progressed from localized area on L wrist, pt states she has been using Benadryl and cream. No respiratory involvement at this time.

## 2014-06-19 DIAGNOSIS — L230 Allergic contact dermatitis due to metals: Secondary | ICD-10-CM | POA: Diagnosis not present

## 2014-06-19 LAB — CBG MONITORING, ED: Glucose-Capillary: 67 mg/dL — ABNORMAL LOW (ref 70–99)

## 2014-06-19 MED ORDER — CETIRIZINE HCL 10 MG PO CAPS: 10.0000 mg | ORAL_CAPSULE | Freq: Every day | ORAL | Status: DC | PRN

## 2014-06-19 MED ORDER — FAMOTIDINE IN NACL 20-0.9 MG/50ML-% IV SOLN
20.0000 mg | Freq: Once | INTRAVENOUS | Status: AC
  Administered 2014-06-19: 20 mg via INTRAVENOUS
  Filled 2014-06-19: qty 50

## 2014-06-19 MED ORDER — METHYLPREDNISOLONE SODIUM SUCC 125 MG IJ SOLR
125.0000 mg | Freq: Once | INTRAMUSCULAR | Status: AC
  Administered 2014-06-19: 125 mg via INTRAVENOUS
  Filled 2014-06-19: qty 2

## 2014-06-19 MED ORDER — FAMOTIDINE 20 MG PO TABS: 20.0000 mg | ORAL_TABLET | Freq: Two times a day (BID) | ORAL | Status: DC

## 2014-06-19 MED ORDER — DIPHENHYDRAMINE HCL 50 MG/ML IJ SOLN
25.0000 mg | Freq: Once | INTRAMUSCULAR | Status: AC
  Administered 2014-06-19: 25 mg via INTRAVENOUS
  Filled 2014-06-19: qty 1

## 2014-06-19 MED ORDER — SODIUM CHLORIDE 0.9 % IV SOLN
INTRAVENOUS | Status: DC
  Administered 2014-06-19: 125 mL/h via INTRAVENOUS

## 2014-06-19 MED ORDER — PREDNISONE 20 MG PO TABS: ORAL_TABLET | ORAL | Status: DC

## 2014-06-19 NOTE — Discharge Instructions (Signed)
Stay cool, heat will make the rash worse. Take the medications as prescribed. WATCH YOUR DIET WHILE ON THE PREDNISONE, IT CAN MAKE YOUR BLOOD SUGAR GET TOO HIGH!!!  Stop wearing the jewelry that is causing the problem. If you continue to have problems, consider seeing a dermatologist. Go to the nearest ED if you get swelling in your throat, tongue, have trouble swallowing or breathing.

## 2014-06-19 NOTE — ED Provider Notes (Signed)
CSN: [id number]     Arrival date & time 06/18/14  2319 History   First MD Initiated Contact with Patient 06/18/14 2350     Chief Complaint  Patient presents with  . Allergic Reaction     (Consider location/radiation/quality/duration/timing/severity/associated sxs/prior Treatment) HPI  Patient states about 3 weeks ago she started having itching on her left wrist where she was wearing her watch. She didn't put the watch on her other arm and started having itching there also. She reports she has tried wearing a different watch however she continues to have hives and itchiness. She states the last couple days it has spread all over her body. She has been taking Benadryl over-the-counter and using over-the-counter hydrocortisone cream 1%. She denies any swelling in her mouth for her tongue, lip swelling, or shortness of breath. She has had some mild nausea. She states she's had reactions like this in the past but it was several years ago.  PCP Dr Larose Kells  Past Medical History  Diagnosis Date  . Hypertension 09/29/2006  . Cholelithiasis   . Headache(784.0)   . Anxiety   . Thrombocytopenia 08/27/2011    hematology evaluation 08-2011, return prn  . Dry eye syndrome   . DJD (degenerative joint disease)    Past Surgical History  Procedure Laterality Date  . Appendectomy    . Abdominal hysterectomy      no oophorectomy  . Tonsillectomy     Family History  Problem Relation Age of Onset  . Coronary artery disease Father   . Diabetes Other     uncles   . Hypertension Mother   . Prostate cancer Other     grandfather  . Breast cancer Neg Hx   . Colon cancer Neg Hx   . Anxiety disorder Mother   . Depression Mother   . Depression Maternal Uncle    History  Substance Use Topics  . Smoking status: Never Smoker   . Smokeless tobacco: Never Used  . Alcohol Use: No   Lives alone  OB History    No data available     Review of Systems  All other systems reviewed and are  negative.     Allergies  Bee venom; Doxycycline; Penicillins; Sertraline; Azithromycin; Cerumenex; Climara; and Nickel  Home Medications   Prior to Admission medications   Medication Sig Start Date End Date Taking? Authorizing Provider  aspirin 81 MG tablet Take 81 mg by mouth daily with breakfast.    Yes Historical Provider, MD  Calcium-Vitamin D (CALCIUM + D PO) Take 2 tablets by mouth daily with lunch.    Yes Historical Provider, MD  carvedilol (COREG) 6.25 MG tablet TAKE 1 TABLET (6.25 MG TOTAL) BY MOUTH 2 (TWO) TIMES DAILY WITH A MEAL. 04/27/14  Yes Colon Branch, MD  cycloSPORINE (RESTASIS) 0.05 % ophthalmic emulsion Place 1 drop into both eyes every 12 (twelve) hours.    Yes Historical Provider, MD  diazepam (VALIUM) 5 MG tablet Take 1 tablet (5 mg total) by mouth every 12 (twelve) hours as needed for muscle spasms. 05/13/14  Yes Blanchie Dessert, MD  diphenhydrAMINE (BENADRYL) 25 MG tablet Take 25 mg by mouth every 6 (six) hours as needed for allergies.    Yes Historical Provider, MD  glucosamine-chondroitin 500-400 MG tablet Take 1 tablet by mouth 3 (three) times daily.     Yes Historical Provider, MD  meloxicam (MOBIC) 7.5 MG tablet Take 1 tablet (7.5 mg total) by mouth daily. 06/01/14  Yes Liam Graham,  PA-C  Multiple Vitamin (MULTIVITAMIN WITH MINERALS) TABS Take 1 tablet by mouth daily with breakfast.   Yes Historical Provider, MD  Polyethyl Glycol-Propyl Glycol (SYSTANE ULTRA) 0.4-0.3 % SOLN Place 1 drop into both eyes 3 (three) times daily as needed (dry eyes).    Yes Historical Provider, MD  spironolactone (ALDACTONE) 25 MG tablet TAKE 1 TABLET (25 MG TOTAL) BY MOUTH EVERY MORNING. 04/27/14  Yes Colon Branch, MD  traMADol (ULTRAM) 50 MG tablet Take 1 tablet (50 mg total) by mouth every 6 (six) hours as needed. 06/01/14  Yes Freeman Caldron Baker, PA-C  ipratropium (ATROVENT) 0.06 % nasal spray Place 2 sprays into both nostrils every 4 (four) hours as needed for rhinitis. Patient not  taking: Reported on 06/19/2014 02/11/14   Liam Graham, PA-C   BP 112/73 mmHg  Pulse 85  Temp(Src) 97.4 F (36.3 C) (Oral)  Resp 18  Ht 5\' 2"  (1.575 m)  Wt 166 lb (75.297 kg)  BMI 30.35 kg/m2  SpO2 100%  Vital signs normal   Physical Exam  Constitutional: She is oriented to person, place, and time. She appears well-developed and well-nourished.  Non-toxic appearance. She does not appear ill. No distress.  HENT:  Head: Normocephalic and atraumatic.  Right Ear: External ear normal.  Left Ear: External ear normal.  Nose: Nose normal. No mucosal edema or rhinorrhea.  Mouth/Throat: Oropharynx is clear and moist and mucous membranes are normal. No dental abscesses or uvula swelling.  Eyes: Conjunctivae and EOM are normal. Pupils are equal, round, and reactive to light.  Neck: Normal range of motion and full passive range of motion without pain. Neck supple.  Patient is moving her head freely during the course of conversation.  Cardiovascular: Normal rate, regular rhythm and normal heart sounds.  Exam reveals no gallop and no friction rub.   No murmur heard. Pulmonary/Chest: Effort normal and breath sounds normal. No respiratory distress. She has no wheezes. She has no rhonchi. She has no rales. She exhibits no tenderness and no crepitus.  Abdominal: Soft. Normal appearance and bowel sounds are normal. She exhibits no distension. There is no tenderness. There is no rebound and no guarding.  Musculoskeletal: Normal range of motion. She exhibits no edema or tenderness.  Moves all extremities well.   Neurological: She is alert and oriented to person, place, and time. She has normal strength. No cranial nerve deficit.  Skin: Skin is warm, dry and intact. Rash noted. No erythema. No pallor.  Patient has scattered urticarial type rash on her back, chest, upper and lower extremities that are worse near the groin and waist line.  Psychiatric: She has a normal mood and affect. Her speech is  normal and behavior is normal. Her mood appears not anxious.  Nursing note and vitals reviewed.   ED Course  Procedures (including critical care time)  Medications  0.9 %  sodium chloride infusion (125 mL/hr Intravenous New Bag/Given 06/19/14 0046)  diphenhydrAMINE (BENADRYL) injection 25 mg (25 mg Intravenous Given 06/19/14 0046)  famotidine (PEPCID) IVPB 20 mg (20 mg Intravenous New Bag/Given 06/19/14 0046)  methylPREDNISolone sodium succinate (SOLU-MEDROL) 125 mg/2 mL injection 125 mg (125 mg Intravenous Given 06/19/14 0047)    Recheck, pt states her itching is gone and she is feeling better.   Labs Review Labs Reviewed  CBG MONITORING, ED - Abnormal; Notable for the following:    Glucose-Capillary 67 (*)    All other components within normal limits    Imaging Review No results  found.   EKG Interpretation None      MDM   Final diagnoses:  Allergic reaction, initial encounter  Urticaria  Nickel allergy   New Prescriptions   CETIRIZINE HCL (ZYRTEC ALLERGY) 10 MG CAPS    Take 1 capsule (10 mg total) by mouth daily as needed (itching and rash).   FAMOTIDINE (PEPCID) 20 MG TABLET    Take 1 tablet (20 mg total) by mouth 2 (two) times daily.   PREDNISONE (DELTASONE) 20 MG TABLET    Take 3 po QD x 3d , then 2 po QD x 3d then 1 po QD x 3d    Plan discharge  Rolland Porter, MD, Alanson Aly, MD 06/19/14 [phone]

## 2014-07-07 ENCOUNTER — Telehealth: Payer: Self-pay | Admitting: Internal Medicine

## 2014-07-07 ENCOUNTER — Other Ambulatory Visit: Payer: Self-pay

## 2014-07-07 MED ORDER — CETIRIZINE HCL 10 MG PO CAPS: 10.0000 mg | ORAL_CAPSULE | Freq: Every day | ORAL | Status: DC | PRN

## 2014-07-07 NOTE — Telephone Encounter (Signed)
Cetirizine refilled to [facility] in [place], [place] # [address] 0RF.

## 2014-07-07 NOTE — Telephone Encounter (Signed)
Caller name: Taraya Relation to pt: self Call back number: [phone] Pharmacy: [id number] in [place], [place] F: [phone]  Reason for call:   Requesting Cetirizine refill

## 2014-07-09 DIAGNOSIS — Z91048 Other nonmedicinal substance allergy status: Secondary | ICD-10-CM | POA: Diagnosis not present

## 2014-07-09 DIAGNOSIS — I10 Essential (primary) hypertension: Secondary | ICD-10-CM | POA: Diagnosis not present

## 2014-07-09 DIAGNOSIS — T7840XA Allergy, unspecified, initial encounter: Secondary | ICD-10-CM | POA: Diagnosis not present

## 2014-08-08 DIAGNOSIS — Z91048 Other nonmedicinal substance allergy status: Secondary | ICD-10-CM | POA: Diagnosis not present

## 2014-08-08 DIAGNOSIS — Z91030 Bee allergy status: Secondary | ICD-10-CM | POA: Diagnosis not present

## 2014-08-08 DIAGNOSIS — T7840XA Allergy, unspecified, initial encounter: Secondary | ICD-10-CM | POA: Diagnosis not present

## 2014-08-08 DIAGNOSIS — I10 Essential (primary) hypertension: Secondary | ICD-10-CM | POA: Diagnosis not present

## 2014-08-08 DIAGNOSIS — J189 Pneumonia, unspecified organism: Secondary | ICD-10-CM | POA: Diagnosis not present

## 2014-08-08 DIAGNOSIS — Z880 Allergy status to penicillin: Secondary | ICD-10-CM | POA: Diagnosis not present

## 2014-08-08 DIAGNOSIS — Z888 Allergy status to other drugs, medicaments and biological substances status: Secondary | ICD-10-CM | POA: Diagnosis not present

## 2014-08-08 DIAGNOSIS — Z881 Allergy status to other antibiotic agents status: Secondary | ICD-10-CM | POA: Diagnosis not present

## 2014-08-11 ENCOUNTER — Other Ambulatory Visit: Payer: Self-pay

## 2014-08-14 ENCOUNTER — Ambulatory Visit: Payer: Medicare Other | Admitting: Internal Medicine

## 2014-08-17 ENCOUNTER — Encounter: Payer: Self-pay | Admitting: Internal Medicine

## 2014-08-17 ENCOUNTER — Ambulatory Visit (INDEPENDENT_AMBULATORY_CARE_PROVIDER_SITE_OTHER): Payer: Medicare Other | Admitting: Internal Medicine

## 2014-08-17 ENCOUNTER — Ambulatory Visit (HOSPITAL_BASED_OUTPATIENT_CLINIC_OR_DEPARTMENT_OTHER)
Admission: RE | Admit: 2014-08-17 | Discharge: 2014-08-17 | Disposition: A | Discharge status: Home / Self Care | Payer: Medicare Other | Source: Ambulatory Visit | Attending: Internal Medicine | Admitting: Internal Medicine

## 2014-08-17 VITALS — BP 125/83 | HR 87 | Temp 98.3°F | Ht 62.0 in | Wt 164.2 lb

## 2014-08-17 DIAGNOSIS — I517 Cardiomegaly: Secondary | ICD-10-CM | POA: Diagnosis not present

## 2014-08-17 DIAGNOSIS — J189 Pneumonia, unspecified organism: Secondary | ICD-10-CM | POA: Insufficient documentation

## 2014-08-17 DIAGNOSIS — T7840XA Allergy, unspecified, initial encounter: Secondary | ICD-10-CM | POA: Insufficient documentation

## 2014-08-17 DIAGNOSIS — T7840XD Allergy, unspecified, subsequent encounter: Secondary | ICD-10-CM | POA: Diagnosis not present

## 2014-08-17 DIAGNOSIS — R05 Cough: Secondary | ICD-10-CM | POA: Diagnosis not present

## 2014-08-17 DIAGNOSIS — Z8701 Personal history of pneumonia (recurrent): Secondary | ICD-10-CM | POA: Diagnosis not present

## 2014-08-17 MED ORDER — EPINEPHRINE 0.3 MG/0.3ML IJ SOAJ: 0.3000 mg | Freq: Once | INTRAMUSCULAR | Status: DC

## 2014-08-17 NOTE — Patient Instructions (Signed)
Take Pepcid twice a day Take Benadryl as needed  [place] a EpiPen with you, get familiar with the device. Use it if you develop a severe allergic reaction with lip or tongue swelling or if you are short of breath. Once you use it, call 911  We are referring you to and allergies  Next visit here in 3 months  For now, stop any OTC vitamins, calcium supplements, sometimes they have fillers     that may cause   allergies

## 2014-08-17 NOTE — Assessment & Plan Note (Signed)
Having hives on and off, also had what seemed to be as severe reaction with   lip swelling. Unclear what is triggering symptoms. Plan: Pepcid twice a day, Benadryl when necessary, epipen, referral to allergy

## 2014-08-17 NOTE — Progress Notes (Signed)
Pre visit review using our clinic review tool, if applicable. No additional management support is needed unless otherwise documented below in the visit note. 

## 2014-08-17 NOTE — Progress Notes (Signed)
Subjective:    Patient ID: Monique [name], female    DOB: October 18, 1940, 74 y.o.   MRN: [id number]  DOS:  08/17/2014 Type of visit - description : ED f/u Interval history: Went to ER in [facility] twice. On 07/09/2014, she developed hives all over, did not have lip or tongue swelling, no cough or wheezing. Records reviewed, BP was 124/77, pulse was 103, she was prescribed prednisone 20 mg twice a day for 5 days and improved. On 08/08/2014 went to the ER again, this time she had face and severe lip swelling to the point that she couldn't  stick her tongue out. Additionally she had some cough and on exam she had crackles, apparently a chest x-ray was not done but she was diagnosed with severe allergic reaction and pneumonia. Was treated with Levaquin and prednisone. Prior to the ER visit he she went to eat to [place] [employer], reports that she did not eat shrimp or any seafood.  Review of Systems Since the  second visit to the ER, denies any further face or lip swelling. Still has occasional hives. Taking Benadryl and Pepcid as needed. She's not taking any new medication, she has changed her brand of some of her nutritional supplements. No fever or cough   Past Medical History  Diagnosis Date  . Hypertension 09/29/2006  . Cholelithiasis   . Headache(784.0)   . Anxiety   . Thrombocytopenia 08/27/2011    hematology evaluation 08-2011, return prn  . Dry eye syndrome   . DJD (degenerative joint disease)     Past Surgical History  Procedure Laterality Date  . Appendectomy    . Abdominal hysterectomy      no oophorectomy  . Tonsillectomy      History   Social History  . Marital Status: Married    Spouse Name: N/A  . Number of Children: 2  . Years of Education: N/A   Occupational History  . 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 Smoking status: Never Smoker   . Smokeless tobacco: Never Used  . Alcohol Use: No  . Drug Use: No  . Sexual Activity:  Not on file   Other Topics Concern  . Not on file   Social History Narrative   husband has prostate ca , passed away 10-10-13   takes care of mom, 43 y/o who is blind , and an uncle                Medication List       This list is accurate as of: 08/17/14  7:02 PM.  Always use your most recent med list.               aspirin 81 MG tablet  Take 81 mg by mouth daily with breakfast.     CALCIUM + D PO  Take 2 tablets by mouth daily with lunch.     carvedilol 6.25 MG tablet  Commonly known as:  COREG  TAKE 1 TABLET (6.25 MG TOTAL) BY MOUTH 2 (TWO) TIMES DAILY WITH A MEAL.     cycloSPORINE 0.05 % ophthalmic emulsion  Commonly known as:  RESTASIS  Place 1 drop into both eyes every 12 (twelve) hours.     diazepam 5 MG tablet  Commonly known as:  VALIUM  Take 1 tablet (5 mg total) by mouth every 12 (twelve) hours as needed for muscle spasms.     diphenhydrAMINE 25  MG tablet  Commonly known as:  BENADRYL  Take 25 mg by mouth every 6 (six) hours as needed for allergies.     EPINEPHrine 0.3 mg/0.3 mL Soaj injection  Commonly known as:  EPIPEN 2-PAK  Inject 0.3 mLs (0.3 mg total) into the muscle once.     famotidine 20 MG tablet  Commonly known as:  PEPCID  Take 1 tablet (20 mg total) by mouth 2 (two) times daily.     glucosamine-chondroitin 500-400 MG tablet  Take 1 tablet by mouth 3 (three) times daily.     ipratropium 0.06 % nasal spray  Commonly known as:  ATROVENT  Place 2 sprays into both nostrils every 4 (four) hours as needed for rhinitis.     meloxicam 7.5 MG tablet  Commonly known as:  MOBIC  Take 1 tablet (7.5 mg total) by mouth daily.     multivitamin with minerals Tabs tablet  Take 1 tablet by mouth daily with breakfast.     spironolactone 25 MG tablet  Commonly known as:  ALDACTONE  TAKE 1 TABLET (25 MG TOTAL) BY MOUTH EVERY MORNING.     SYSTANE ULTRA 0.4-0.3 % Soln  Generic drug:  Polyethyl Glycol-Propyl Glycol  Place 1 drop into both eyes 3  (three) times daily as needed (dry eyes).           Objective:   Physical Exam BP 125/83 mmHg  Pulse 87  Temp(Src) 98.3 F (36.8 C) (Oral)  Ht 5\' 2"  (1.575 m)  Wt 164 lb 4 oz (74.503 kg)  BMI 30.03 kg/m2  SpO2 98%  General:   Well developed, well nourished . NAD.  HEENT:  Normocephalic . Face symmetric, atraumatic. Lips and tonge normal Lungs:  CTA B Normal respiratory effort, no intercostal retractions, no accessory muscle use. Heart: RRR,  no murmur.  Muscle skeletal: no pretibial edema bilaterally  Skin: Not pale. Not jaundice. No hives on the exposed areas Neurologic:  alert & oriented X3.  Speech normal, gait appropriate for age and unassisted Psych--  Cognition and judgment appear intact.  Cooperative with normal attention span and concentration.  Behavior appropriate. No anxious or depressed appearing.       Assessment & Plan:   ? pneumonia, Diagnosed with pneumonia 08/08/2014 based on physical exam (crackles), chest x-ray was not done, status post Levaquin, currently afebrile and no coughing. For completeness we'll check a chest x-ray.

## 2014-09-06 ENCOUNTER — Encounter (HOSPITAL_COMMUNITY): Payer: Self-pay | Admitting: *Deleted

## 2014-09-06 ENCOUNTER — Emergency Department (INDEPENDENT_AMBULATORY_CARE_PROVIDER_SITE_OTHER)
Admission: EM | Admit: 2014-09-06 | Discharge: 2014-09-06 | Disposition: A | Discharge status: Home / Self Care | Payer: Medicare Other | Source: Home / Self Care | Attending: Family Medicine | Admitting: Family Medicine

## 2014-09-06 DIAGNOSIS — T7840XS Allergy, unspecified, sequela: Secondary | ICD-10-CM

## 2014-09-06 MED ORDER — METHYLPREDNISOLONE SODIUM SUCC 125 MG IJ SOLR
INTRAMUSCULAR | Status: AC
  Filled 2014-09-06: qty 2

## 2014-09-06 MED ORDER — DIPHENHYDRAMINE HCL 50 MG/ML IJ SOLN
INTRAMUSCULAR | Status: AC
  Filled 2014-09-06: qty 1

## 2014-09-06 MED ORDER — PREDNISONE 20 MG PO TABS: ORAL_TABLET | ORAL | Status: DC

## 2014-09-06 MED ORDER — DIPHENHYDRAMINE HCL 50 MG/ML IJ SOLN
25.0000 mg | Freq: Once | INTRAMUSCULAR | Status: AC
  Administered 2014-09-06: 25 mg via INTRAMUSCULAR

## 2014-09-06 MED ORDER — METHYLPREDNISOLONE SODIUM SUCC 125 MG IJ SOLR
80.0000 mg | Freq: Once | INTRAMUSCULAR | Status: AC
  Administered 2014-09-06: 80 mg via INTRAMUSCULAR

## 2014-09-06 NOTE — ED Provider Notes (Addendum)
CSN: [id number]     Arrival date & time 09/06/14  9381 History   First MD Initiated Contact with Patient 09/06/14 0919     Chief Complaint  Patient presents with  . Allergic Reaction   (Consider location/radiation/quality/duration/timing/severity/associated sxs/prior Treatment) HPI Comments: 74 year old female is complaining of an allergic reaction in the form of facial swelling and generalized pruritic urticaria. She states she has had several episodes that have been worse that includes intraoral swelling, angioedema symptoms, urticaria. She has been seen in the emergency department, one of them and [facility] and a couple times in this emergency department. The etiology has not been found. She has seen her PCP and removed a couple of her medications, primarily vitamins and minerals. She has an appointment with the allergist and 2 days. Currently she is having no trouble breathing. No wheezing or oral swelling. She has taken her Pepcid for the day but no other medications.   Past Medical History  Diagnosis Date  . Hypertension 09/29/2006  . Cholelithiasis   . Headache(784.0)   . Anxiety   . Thrombocytopenia 08/27/2011    hematology evaluation 08-2011, return prn  . Dry eye syndrome   . DJD (degenerative joint disease)    Past Surgical History  Procedure Laterality Date  . Appendectomy    . Abdominal hysterectomy      no oophorectomy  . Tonsillectomy     Family History  Problem Relation Age of Onset  . Coronary artery disease Father   . Diabetes Other     uncles   . Hypertension Mother   . Prostate cancer Other     grandfather  . Breast cancer Neg Hx   . Colon cancer Neg Hx   . Anxiety disorder Mother   . Depression Mother   . Depression Maternal Uncle    History  Substance Use Topics  . Smoking status: Never Smoker   . Smokeless tobacco: Never Used  . Alcohol Use: No   OB History    No data available     Review of Systems  Constitutional: Negative for fever,  activity change and fatigue.  HENT: Negative for congestion, postnasal drip, rhinorrhea, sore throat, trouble swallowing and voice change.   Eyes: Negative.   Respiratory: Negative for cough, shortness of breath, wheezing and stridor.   Cardiovascular: Negative.   Genitourinary: Negative.   Skin: Positive for rash.  Neurological: Negative for speech difficulty, weakness, numbness and headaches.    Allergies  Bee venom; Doxycycline; Penicillins; Sertraline; Azithromycin; Cerumenex; Climara; and Nickel  Home Medications   Prior to Admission medications   Medication Sig Start Date End Date Taking? Authorizing Provider  aspirin 81 MG tablet Take 81 mg by mouth daily with breakfast.     Historical Provider, MD  Calcium-Vitamin D (CALCIUM + D PO) Take 2 tablets by mouth daily with lunch.     Historical Provider, MD  carvedilol (COREG) 6.25 MG tablet TAKE 1 TABLET (6.25 MG TOTAL) BY MOUTH 2 (TWO) TIMES DAILY WITH A MEAL. 04/27/14   Colon Branch, MD  cycloSPORINE (RESTASIS) 0.05 % ophthalmic emulsion Place 1 drop into both eyes every 12 (twelve) hours.     Historical Provider, MD  diazepam (VALIUM) 5 MG tablet Take 1 tablet (5 mg total) by mouth every 12 (twelve) hours as needed for muscle spasms. 05/13/14   Blanchie Dessert, MD  diphenhydrAMINE (BENADRYL) 25 MG tablet Take 25 mg by mouth every 6 (six) hours as needed for allergies.  Historical Provider, MD  EPINEPHrine (EPIPEN 2-PAK) 0.3 mg/0.3 mL IJ SOAJ injection Inject 0.3 mLs (0.3 mg total) into the muscle once. 08/17/14   Colon Branch, MD  famotidine (PEPCID) 20 MG tablet Take 1 tablet (20 mg total) by mouth 2 (two) times daily. 06/19/14   Rolland Porter, MD  glucosamine-chondroitin 500-400 MG tablet Take 1 tablet by mouth 3 (three) times daily.      Historical Provider, MD  ipratropium (ATROVENT) 0.06 % nasal spray Place 2 sprays into both nostrils every 4 (four) hours as needed for rhinitis. Patient not taking: Reported on 06/19/2014 02/11/14    Liam Graham, PA-C  meloxicam (MOBIC) 7.5 MG tablet Take 1 tablet (7.5 mg total) by mouth daily. Patient not taking: Reported on 08/17/2014 06/01/14   Liam Graham, PA-C  Multiple Vitamin (MULTIVITAMIN WITH MINERALS) TABS Take 1 tablet by mouth daily with breakfast.    Historical Provider, MD  Polyethyl Glycol-Propyl Glycol (SYSTANE ULTRA) 0.4-0.3 % SOLN Place 1 drop into both eyes 3 (three) times daily as needed (dry eyes).     Historical Provider, MD  predniSONE (DELTASONE) 20 MG tablet Take 3 tabs po on first day, 2 tabs second day, 2 tabs third day, 1 tab fourth day, 1 tab 5th day. Take with food. 09/06/14   Janne Napoleon, NP  spironolactone (ALDACTONE) 25 MG tablet TAKE 1 TABLET (25 MG TOTAL) BY MOUTH EVERY MORNING. 04/27/14   Colon Branch, MD   BP 134/86 mmHg  Pulse 78  Temp(Src) 98.6 F (37 C) (Oral)  Resp 16  SpO2 100% Physical Exam  Constitutional: She is oriented to person, place, and time. She appears well-developed and well-nourished. No distress.  HENT:  .Bilateral TMs are normal Oropharynx: Due to patient's tongue retraction the soft palate and uvula B visualized and there is no swelling or edema there. Unable to see lower in the posterior pharynx. There is no lingual edema. There is mild bilateral facial swelling with urticaria. No swelling or edema of the lips  Eyes: Conjunctivae and EOM are normal.  Neck: Normal range of motion. Neck supple.  Cardiovascular: Normal rate, regular rhythm, normal heart sounds and intact distal pulses.   Pulmonary/Chest: Effort normal and breath sounds normal. No respiratory distress. She has no wheezes. She has no rales.  Lungs with good air movement and with no adventitious sounds.  Musculoskeletal: She exhibits no tenderness.  There is 1+ pitting edema to the bilateral ankles.  Lymphadenopathy:    She has no cervical adenopathy.  Neurological: She is alert and oriented to person, place, and time. She exhibits normal muscle tone.  Speech is  clear without dysarthria.  Skin: Skin is warm and dry.  Generalized urticaria  Psychiatric: She has a normal mood and affect.  Nursing note and vitals reviewed.   ED Course  Procedures (including critical care time) Labs Review Labs Reviewed - No data to display  Imaging Review No results found.   MDM   1. Acute allergic reaction, sequela     10:30 AM: Reassessment. Patient is feeling better. Her itching is resolving and her lungs remain clear. She still has some swelling around the face but no intraoral or lip swelling. Her speech is clear, alert and oriented, not having shortness of breath. She is accompanied by her significant other/friend or relative. She was discharged with a prescription for tapering dose of prednisone, Benadryl 25 mg by mouth every 4 hours and continue Pepcid 20 mg twice a day as previously  directed. For any worsening, new symptoms or problems such as swelling in the throat mouth and tongue lips, trouble breathing or wheezing go directly to the emergency department. Benadryl 25 mg IM    Janne Napoleon, NP 09/06/14 1036  Janne Napoleon, NP 09/08/14 2123

## 2014-09-06 NOTE — ED Notes (Signed)
Pt  Reports       Symptoms    Of  Facial  Swelling         And  Some  Tightness  In throat         These  Symptoms    Began  Today        Pt  Also  Reports  Symptoms  Of       A  Rash  With  Itching      -        Has  Been  Seen    In past       For  Similar  Episode

## 2014-09-08 DIAGNOSIS — T783XXA Angioneurotic edema, initial encounter: Secondary | ICD-10-CM | POA: Diagnosis not present

## 2014-09-08 DIAGNOSIS — J309 Allergic rhinitis, unspecified: Secondary | ICD-10-CM | POA: Diagnosis not present

## 2014-09-08 DIAGNOSIS — L509 Urticaria, unspecified: Secondary | ICD-10-CM | POA: Diagnosis not present

## 2014-09-08 DIAGNOSIS — J300 Vasomotor rhinitis: Secondary | ICD-10-CM | POA: Diagnosis not present

## 2014-09-16 ENCOUNTER — Encounter (HOSPITAL_COMMUNITY): Payer: Self-pay | Admitting: Emergency Medicine

## 2014-09-16 ENCOUNTER — Emergency Department (INDEPENDENT_AMBULATORY_CARE_PROVIDER_SITE_OTHER)
Admission: EM | Admit: 2014-09-16 | Discharge: 2014-09-16 | Disposition: A | Discharge status: Home / Self Care | Payer: Medicare Other | Source: Home / Self Care | Attending: Emergency Medicine | Admitting: Emergency Medicine

## 2014-09-16 DIAGNOSIS — R197 Diarrhea, unspecified: Secondary | ICD-10-CM | POA: Diagnosis not present

## 2014-09-16 DIAGNOSIS — M549 Dorsalgia, unspecified: Secondary | ICD-10-CM

## 2014-09-16 NOTE — ED Notes (Signed)
Pt states that she started having back pain yesterday 09/15/2014. Pt denies any injury or fall.

## 2014-09-16 NOTE — ED Provider Notes (Signed)
CSN: [id number]     Arrival date & time 09/16/14  1510 History   First MD Initiated Contact with Patient 09/16/14 1648     Chief Complaint  Patient presents with  . Back Pain   (Consider location/radiation/quality/duration/timing/severity/associated sxs/prior Treatment) HPI  She is a 74 year old woman coming in for evaluation of back pain. This pain is located in her mid back. It does not radiate. No associated numbness, tingling, weakness. She has not tried anything for this. She denies any injury or trauma. Nothing makes it worse. It did get better when she rested on the sofa with her legs up.  She also reports multiple allergic reactions over the last 3-4 months. She has seen an allergy specialist. The last few months she has been on multiple antibiotics and other medications. For the last month she has had watery diarrhea. She denies any blood in the stool. She also describes a bloated sensation. Today she states she had some mild nausea. No vomiting. No fevers.  Past Medical History  Diagnosis Date  . Hypertension 09/29/2006  . Cholelithiasis   . Headache(784.0)   . Anxiety   . Thrombocytopenia 08/27/2011    hematology evaluation 08-2011, return prn  . Dry eye syndrome   . DJD (degenerative joint disease)    Past Surgical History  Procedure Laterality Date  . Appendectomy    . Abdominal hysterectomy      no oophorectomy  . Tonsillectomy     Family History  Problem Relation Age of Onset  . Coronary artery disease Father   . Diabetes Other     uncles   . Hypertension Mother   . Prostate cancer Other     grandfather  . Breast cancer Neg Hx   . Colon cancer Neg Hx   . Anxiety disorder Mother   . Depression Mother   . Depression Maternal Uncle    History  Substance Use Topics  . Smoking status: Never Smoker   . Smokeless tobacco: Never Used  . Alcohol Use: No   OB History    No data available     Review of Systems  Constitutional: Negative for fever, chills and  appetite change.  HENT: Negative.   Respiratory: Negative.   Cardiovascular: Negative.   Gastrointestinal: Positive for nausea and diarrhea. Negative for vomiting, abdominal pain and blood in stool.  Genitourinary: Negative for dysuria.  Musculoskeletal: Positive for back pain.    Allergies  Bee venom; Doxycycline; Penicillins; Sertraline; Azithromycin; Cerumenex; Climara; and Nickel  Home Medications   Prior to Admission medications   Medication Sig Start Date End Date Taking? Authorizing Provider  aspirin 81 MG tablet Take 81 mg by mouth daily with breakfast.     Historical Provider, MD  Calcium-Vitamin D (CALCIUM + D PO) Take 2 tablets by mouth daily with lunch.     Historical Provider, MD  carvedilol (COREG) 6.25 MG tablet TAKE 1 TABLET (6.25 MG TOTAL) BY MOUTH 2 (TWO) TIMES DAILY WITH A MEAL. 04/27/14   Colon Branch, MD  cycloSPORINE (RESTASIS) 0.05 % ophthalmic emulsion Place 1 drop into both eyes every 12 (twelve) hours.     Historical Provider, MD  diazepam (VALIUM) 5 MG tablet Take 1 tablet (5 mg total) by mouth every 12 (twelve) hours as needed for muscle spasms. 05/13/14   Blanchie Dessert, MD  diphenhydrAMINE (BENADRYL) 25 MG tablet Take 25 mg by mouth every 6 (six) hours as needed for allergies.     Historical Provider, MD  EPINEPHrine [facility]  2-PAK) 0.3 mg/0.3 mL IJ SOAJ injection Inject 0.3 mLs (0.3 mg total) into the muscle once. 08/17/14   Colon Branch, MD  famotidine (PEPCID) 20 MG tablet Take 1 tablet (20 mg total) by mouth 2 (two) times daily. 06/19/14   Rolland Porter, MD  glucosamine-chondroitin 500-400 MG tablet Take 1 tablet by mouth 3 (three) times daily.      Historical Provider, MD  ipratropium (ATROVENT) 0.06 % nasal spray Place 2 sprays into both nostrils every 4 (four) hours as needed for rhinitis. Patient not taking: Reported on 06/19/2014 02/11/14   Liam Graham, PA-C  meloxicam (MOBIC) 7.5 MG tablet Take 1 tablet (7.5 mg total) by mouth daily. Patient not taking:  Reported on 08/17/2014 06/01/14   Liam Graham, PA-C  Multiple Vitamin (MULTIVITAMIN WITH MINERALS) TABS Take 1 tablet by mouth daily with breakfast.    Historical Provider, MD  Polyethyl Glycol-Propyl Glycol (SYSTANE ULTRA) 0.4-0.3 % SOLN Place 1 drop into both eyes 3 (three) times daily as needed (dry eyes).     Historical Provider, MD  predniSONE (DELTASONE) 20 MG tablet Take 3 tabs po on first day, 2 tabs second day, 2 tabs third day, 1 tab fourth day, 1 tab 5th day. Take with food. 09/06/14   Janne Napoleon, NP  spironolactone (ALDACTONE) 25 MG tablet TAKE 1 TABLET (25 MG TOTAL) BY MOUTH EVERY MORNING. 04/27/14   Colon Branch, MD   BP 120/73 mmHg  Pulse 74  Temp(Src) 98.4 F (36.9 C) (Oral)  Resp 16  SpO2 98% Physical Exam  Constitutional: She is oriented to person, place, and time. She appears well-developed and well-nourished. No distress.  Cardiovascular: Normal rate, regular rhythm and normal heart sounds.   No murmur heard. Pulmonary/Chest: Effort normal and breath sounds normal. No respiratory distress. She has no wheezes. She has no rales.  Musculoskeletal:  Back: No vertebral tenderness or step-offs. No specific point tenderness. She does have some mild spasm in thoracic paraspinous muscles. 5 out of 5 strength in bilateral lower extremities.  Neurological: She is alert and oriented to person, place, and time.    ED Course  Procedures (including critical care time) Labs Review Labs Reviewed  CLOSTRIDIUM DIFFICILE BY PCR    Imaging Review No results found.   MDM   1. Mid back pain   2. Diarrhea    No red flags for the back pain. Symptomatic treatment with Tylenol, ice, heat.  We have collected stool for C. difficile given her recent antibiotic use. We will call her with the results.  Return precautions reviewed as in after visit summary. She has an appointment scheduled with her PCP on Tuesday.    Melony Overly, MD 09/16/14 [phone]

## 2014-09-16 NOTE — Discharge Instructions (Signed)
I am not sure what is causing your back pain. It does not seem to be anything serious. Take Tylenol per packaging as needed.  We collected some of your stool today to test for an infection. We will call you with the results.  If your back pain is getting worse or you develop shooting pains in your legs, please go to the emergency room. Otherwise, follow-up with your PCP as scheduled.

## 2014-09-16 NOTE — ED Notes (Signed)
Per lab patient's C-Diff was canceled due to specimen being completely formed and not watery.  Dr Bridgett Larsson made aware

## 2014-09-19 ENCOUNTER — Encounter: Payer: Self-pay | Admitting: Internal Medicine

## 2014-09-19 ENCOUNTER — Ambulatory Visit (INDEPENDENT_AMBULATORY_CARE_PROVIDER_SITE_OTHER): Payer: Medicare Other | Admitting: Internal Medicine

## 2014-09-19 VITALS — BP 134/82 | HR 74 | Temp 98.1°F | Ht 62.0 in | Wt 165.4 lb

## 2014-09-19 DIAGNOSIS — T7840XD Allergy, unspecified, subsequent encounter: Secondary | ICD-10-CM

## 2014-09-19 DIAGNOSIS — M545 Low back pain, unspecified: Secondary | ICD-10-CM

## 2014-09-19 DIAGNOSIS — R197 Diarrhea, unspecified: Secondary | ICD-10-CM | POA: Diagnosis not present

## 2014-09-19 NOTE — Patient Instructions (Addendum)
Go to the lab for a UDS and a contract  Next visit by June 2016 for a physical  Call if the diarrhea is not better in few day

## 2014-09-19 NOTE — Assessment & Plan Note (Signed)
Status post eval by Dr. Donneta Romberg, patient reports tests were negative. Currently on conservative treatment, no recent symptoms.

## 2014-09-19 NOTE — Assessment & Plan Note (Addendum)
Was seen at the ER with diarrhea back pain, again the symptoms are really vague, mild, better with Tylenol. Plan: Continue with Tylenol, consider further eval if symptoms increase. She was prescribed diazepam as a muscle relaxant, check a UDS

## 2014-09-19 NOTE — Progress Notes (Signed)
Pre visit review using our clinic review tool, if applicable. No additional management support is needed unless otherwise documented below in the visit note. 

## 2014-09-19 NOTE — Progress Notes (Signed)
Subjective:    Patient ID: Monique [name], female    DOB: May 18, 1941, 74 y.o.   MRN: [id number]  DOS:  09/19/2014 Type of visit - description : ER f/u Interval history: Was seen at the ER 09/16/2014 with back pain and diarrhea. Describes the stools as loose, not watery, this is going on since last month. Back pain, lower, bilateral, no radiation. Pain is better, taking Tylenol.    Review of Systems Denies fever chills. No nausea, vomiting. No blood in the stools.   Past Medical History  Diagnosis Date  . Hypertension 09/29/2006  . Cholelithiasis   . Headache(784.0)   . Anxiety   . Thrombocytopenia 08/27/2011    hematology evaluation 08-2011, return prn  . Dry eye syndrome   . DJD (degenerative joint disease)     Past Surgical History  Procedure Laterality Date  . Appendectomy    . Abdominal hysterectomy      no oophorectomy  . Tonsillectomy      History   Social History  . Marital Status: Married    Spouse Name: N/A  . Number of Children: 2  . Years of Education: N/A   Occupational History  . 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 Smoking status: Never Smoker   . Smokeless tobacco: Never Used  . Alcohol Use: No  . Drug Use: No  . Sexual Activity: Not on file   Other Topics Concern  . Not on file   Social History Narrative   husband has prostate ca , passed away 10-14-13   takes care of mom, 70 y/o who is blind , and an uncle                Medication List       This list is accurate as of: 09/19/14 11:59 PM.  Always use your most recent med list.               aspirin 81 MG tablet  Take 81 mg by mouth daily with breakfast.     CALCIUM + D PO  Take 2 tablets by mouth daily with lunch.     carvedilol 6.25 MG tablet  Commonly known as:  COREG  TAKE 1 TABLET (6.25 MG TOTAL) BY MOUTH 2 (TWO) TIMES DAILY WITH A MEAL.     cycloSPORINE 0.05 % ophthalmic emulsion  Commonly known as:  RESTASIS  Place 1 drop into both  eyes every 12 (twelve) hours.     diazepam 5 MG tablet  Commonly known as:  VALIUM  Take 1 tablet (5 mg total) by mouth every 12 (twelve) hours as needed for muscle spasms.     diphenhydrAMINE 25 MG tablet  Commonly known as:  BENADRYL  Take 25 mg by mouth every 6 (six) hours as needed for allergies.     EPINEPHrine 0.3 mg/0.3 mL Soaj injection  Commonly known as:  EPIPEN 2-PAK  Inject 0.3 mLs (0.3 mg total) into the muscle once.     famotidine 20 MG tablet  Commonly known as:  PEPCID  Take 1 tablet (20 mg total) by mouth 2 (two) times daily.     glucosamine-chondroitin 500-400 MG tablet  Take 1 tablet by mouth 3 (three) times daily.     ipratropium 0.06 % nasal spray  Commonly known as:  ATROVENT  Place 2 sprays into both nostrils every 4 (four) hours as needed for rhinitis.     meloxicam  7.5 MG tablet  Commonly known as:  MOBIC  Take 1 tablet (7.5 mg total) by mouth daily.     multivitamin with minerals Tabs tablet  Take 1 tablet by mouth daily with breakfast.     spironolactone 25 MG tablet  Commonly known as:  ALDACTONE  TAKE 1 TABLET (25 MG TOTAL) BY MOUTH EVERY MORNING.     SYSTANE ULTRA 0.4-0.3 % Soln  Generic drug:  Polyethyl Glycol-Propyl Glycol  Place 1 drop into both eyes 3 (three) times daily as needed (dry eyes).           Objective:   Physical Exam BP 134/82 mmHg  Pulse 74  Temp(Src) 98.1 F (36.7 C) (Oral)  Ht 5\' 2"  (1.575 m)  Wt 165 lb 6 oz (75.014 kg)  BMI 30.24 kg/m2  SpO2 91%  General:   Well developed, well nourished . NAD.  HEENT:  Normocephalic . Face symmetric, atraumatic Abdomen, not distended, soft, bowel sounds present. No rebound or mass  Muscle skeletal: no pretibial edema bilaterally  Skin: Not pale. Not jaundice Neurologic:  alert & oriented X3.  Speech normal, gait appropriate for age and unassisted Back no TTP Psych--  Cognition and judgment appear intact.  Cooperative with normal attention span and concentration.    Behavior appropriate. No anxious or depressed appearing.       Assessment & Plan:    Diarrhea,  C. Difficile test  was sent from the ER, results not available at this time. Recommend to the patient conservative treatment including drinking plenty of fluids and Pepto-Bismol. If she's not better will let me know.

## 2014-10-04 ENCOUNTER — Other Ambulatory Visit: Payer: Self-pay

## 2014-10-16 ENCOUNTER — Institutional Professional Consult (permissible substitution): Payer: Medicare Other | Admitting: Internal Medicine

## 2014-11-06 ENCOUNTER — Telehealth: Payer: Self-pay | Admitting: Internal Medicine

## 2014-11-06 MED ORDER — CARVEDILOL 6.25 MG PO TABS: 6.2500 mg | ORAL_TABLET | Freq: Two times a day (BID) | ORAL | Status: DC

## 2014-11-06 MED ORDER — SPIRONOLACTONE 25 MG PO TABS: 25.0000 mg | ORAL_TABLET | Freq: Every day | ORAL | Status: DC

## 2014-11-06 NOTE — Telephone Encounter (Signed)
Carvedilol and Spironolactone sent to [facility] as requested.

## 2014-11-06 NOTE — Telephone Encounter (Signed)
[facility] [address]   NEEDS SPIRONALACTONE 25 MG 1 PER DAY  CARVEDILOL 6.25MC 2 PER DAY

## 2014-11-09 DIAGNOSIS — Z1289 Encounter for screening for malignant neoplasm of other sites: Secondary | ICD-10-CM | POA: Diagnosis not present

## 2014-11-09 DIAGNOSIS — Z1231 Encounter for screening mammogram for malignant neoplasm of breast: Secondary | ICD-10-CM | POA: Diagnosis not present

## 2014-11-13 ENCOUNTER — Telehealth: Payer: Self-pay | Admitting: Internal Medicine

## 2014-11-13 NOTE — Telephone Encounter (Signed)
Pre Visit letter sent  °

## 2014-11-22 ENCOUNTER — Telehealth: Payer: Self-pay

## 2014-11-22 NOTE — Telephone Encounter (Signed)
Patient has CPE 12/04/2014 Discuss MMG at visit

## 2014-11-24 DIAGNOSIS — L814 Other melanin hyperpigmentation: Secondary | ICD-10-CM | POA: Diagnosis not present

## 2014-11-24 DIAGNOSIS — D225 Melanocytic nevi of trunk: Secondary | ICD-10-CM | POA: Diagnosis not present

## 2014-11-24 DIAGNOSIS — L821 Other seborrheic keratosis: Secondary | ICD-10-CM | POA: Diagnosis not present

## 2014-11-24 DIAGNOSIS — D1801 Hemangioma of skin and subcutaneous tissue: Secondary | ICD-10-CM | POA: Diagnosis not present

## 2014-11-24 DIAGNOSIS — L918 Other hypertrophic disorders of the skin: Secondary | ICD-10-CM | POA: Diagnosis not present

## 2014-11-30 ENCOUNTER — Telehealth: Payer: Self-pay | Admitting: *Deleted

## 2014-11-30 NOTE — Telephone Encounter (Signed)
Unable to reach patient at time of Pre-Visit Call.  Left message for patient to return call when available.    

## 2014-12-04 ENCOUNTER — Ambulatory Visit (INDEPENDENT_AMBULATORY_CARE_PROVIDER_SITE_OTHER): Payer: Medicare Other | Admitting: Internal Medicine

## 2014-12-04 ENCOUNTER — Encounter: Payer: Self-pay | Admitting: Internal Medicine

## 2014-12-04 VITALS — BP 132/82 | HR 79 | Temp 97.6°F | Ht 62.0 in | Wt 166.2 lb

## 2014-12-04 DIAGNOSIS — D696 Thrombocytopenia, unspecified: Secondary | ICD-10-CM | POA: Diagnosis not present

## 2014-12-04 DIAGNOSIS — I10 Essential (primary) hypertension: Secondary | ICD-10-CM

## 2014-12-04 DIAGNOSIS — R7309 Other abnormal glucose: Secondary | ICD-10-CM

## 2014-12-04 DIAGNOSIS — R7303 Prediabetes: Secondary | ICD-10-CM

## 2014-12-04 DIAGNOSIS — Z0000 Encounter for general adult medical examination without abnormal findings: Secondary | ICD-10-CM | POA: Diagnosis not present

## 2014-12-04 LAB — COMPREHENSIVE METABOLIC PANEL
ALT: 16 U/L (ref 0–35)
AST: 18 U/L (ref 0–37)
Albumin: 4.3 g/dL (ref 3.5–5.2)
Alkaline Phosphatase: 71 U/L (ref 39–117)
BUN: 13 mg/dL (ref 6–23)
CO2: 29 mEq/L (ref 19–32)
Calcium: 9.7 mg/dL (ref 8.4–10.5)
Chloride: 105 mEq/L (ref 96–112)
Creatinine, Ser: 0.84 mg/dL (ref 0.40–1.20)
GFR: 85.14 mL/min (ref >60.00)
Glucose, Bld: 93 mg/dL (ref 70–99)
Potassium: 4.2 mEq/L (ref 3.5–5.1)
Sodium: 139 mEq/L (ref 135–145)
Total Bilirubin: 0.4 mg/dL (ref 0.2–1.2)
Total Protein: 7 g/dL (ref 6.0–8.3)

## 2014-12-04 LAB — CBC WITH DIFFERENTIAL/PLATELET
Basophils Absolute: 0 10*3/uL (ref 0.0–0.1)
Basophils Relative: 0.3 % (ref 0.0–3.0)
Eosinophils Absolute: 0.3 10*3/uL (ref 0.0–0.7)
Eosinophils Relative: 3.3 % (ref 0.0–5.0)
HCT: 42.3 % (ref 36.0–46.0)
Hemoglobin: 13.3 g/dL (ref 12.0–15.0)
Lymphocytes Relative: 25.9 % (ref 12.0–46.0)
Lymphs Abs: 2 10*3/uL (ref 0.7–4.0)
MCHC: 31.4 g/dL (ref 30.0–36.0)
MCV: 71.8 fl — ABNORMAL LOW (ref 78.0–100.0)
Monocytes Absolute: 0.5 10*3/uL (ref 0.1–1.0)
Monocytes Relative: 6.9 % (ref 3.0–12.0)
Neutro Abs: 5 10*3/uL (ref 1.4–7.7)
Neutrophils Relative %: 63.6 % (ref 43.0–77.0)
Platelets: 157 10*3/uL (ref 150.0–400.0)
RBC: 5.9 Mil/uL — ABNORMAL HIGH (ref 3.87–5.11)
RDW: 15.5 % (ref 11.5–15.5)
WBC: 7.8 10*3/uL (ref 4.0–10.5)

## 2014-12-04 LAB — HEMOGLOBIN A1C: Hgb A1c MFr Bld: 5.9 % (ref 4.6–6.5)

## 2014-12-04 NOTE — Patient Instructions (Addendum)
Get your blood work before you leave       [place] cause injuries and can affect all age groups. It is possible to use preventive measures to significantly decrease the likelihood of falls. There are many simple measures which can make your home safer and prevent falls. OUTDOORS  Repair cracks and edges of walkways and driveways.  Remove high doorway thresholds.  Trim shrubbery on the main path into your home.  Have good outside lighting.  Clear walkways of tools, rocks, debris, and clutter.  Check that handrails are not broken and are securely fastened. Both sides of steps should have handrails.  Have leaves, snow, and ice cleared regularly.  Use sand or salt on walkways during winter months.  In the garage, clean up grease or oil spills. BATHROOM  Install night lights.  Install grab bars by the toilet and in the tub and shower.  Use non-skid mats or decals in the tub or shower.  Place a plastic non-slip stool in the shower to sit on, if needed.  Keep floors dry and clean up all water on the floor immediately.  Remove soap buildup in the tub or shower on a regular basis.  Secure bath mats with non-slip, double-sided rug tape.  Remove throw rugs and tripping hazards from the floors. BEDROOMS  Install night lights.  Make sure a bedside light is easy to reach.  Do not use oversized bedding.  Keep a telephone by your bedside.  Have a firm chair with side arms to use for getting dressed.  Remove throw rugs and tripping hazards from the floor. KITCHEN  Keep handles on pots and pans turned toward the center of the stove. Use back burners when possible.  Clean up spills quickly and allow time for drying.  Avoid walking on wet floors.  Avoid hot utensils and knives.  Position shelves so they are not too high or low.  Place commonly used objects within easy reach.  If necessary, use a sturdy step stool with a grab bar when  reaching.  Keep electrical cables out of the way.  Do not use floor polish or wax that makes floors slippery. If you must use wax, use non-skid floor wax.  Remove throw rugs and tripping hazards from the floor. STAIRWAYS  Never leave objects on stairs.  Place handrails on both sides of stairways and use them. Fix any loose handrails. Make sure handrails on both sides of the stairways are as long as the stairs.  Check carpeting to make sure it is firmly attached along stairs. Make repairs to worn or loose carpet promptly.  Avoid placing throw rugs at the top or bottom of stairways, or properly secure the rug with carpet tape to prevent slippage. Get rid of throw rugs, if possible.  Have an electrician put in a light switch at the top and bottom of the stairs. OTHER FALL PREVENTION TIPS  Wear low-heel or rubber-soled shoes that are supportive and fit well. Wear closed toe shoes.  When using a stepladder, make sure it is fully opened and both spreaders are firmly locked. Do not climb a closed stepladder.  Add color or contrast paint or tape to grab bars and handrails in your home. Place contrasting color strips on first and last steps.  Learn and use mobility aids as needed. Install an electrical emergency response system.  Turn on lights to avoid dark areas. Replace light bulbs that burn out immediately. Get light switches that glow.  Arrange furniture to create clear pathways. Keep furniture in the same place.  Firmly attach carpet with non-skid or double-sided tape.  Eliminate uneven floor surfaces.  Select a carpet pattern that does not visually hide the edge of steps.  Be aware of all pets. OTHER HOME SAFETY TIPS  Set the water temperature for 120 F (48.8 C).  Keep emergency numbers on or near the telephone.  Keep smoke detectors on every level of the home and near sleeping areas. Document Released: 06/06/2002 Document Revised: 12/16/2011 Document Reviewed:  09/05/2011 [facility] Patient Information 2015 [place], [place]. This information is not intended to replace advice given to you by your health care provider. Make sure you discuss any questions you have with your health care provider.   Preventive Care for Adults Ages 57 and over  Blood pressure check.** / Every 1 to 2 years.  Lipid and cholesterol check.**/ Every 5 years beginning at age 22.  Lung cancer screening. / Every year if you are aged 86-80 years and have a 30-pack-year history of smoking and currently smoke or have quit within the past 15 years. Yearly screening is stopped once you have quit smoking for at least 15 years or develop a health problem that would prevent you from having lung cancer treatment.  Fecal occult blood test (FOBT) of stool. / Every year beginning at age 22 and continuing until age 65. You may not have to do this test if you get a colonoscopy every 10 years.  Flexible sigmoidoscopy** or colonoscopy.** / Every 5 years for a flexible sigmoidoscopy or every 10 years for a colonoscopy beginning at age 41 and continuing until age 19.  Hepatitis C blood test.** / For all people born from [birth year] through 1965 and any individual with known risks for hepatitis C.  Abdominal aortic aneurysm (AAA) screening.** / A one-time screening for ages 72 to 46 years who are current or former smokers.  Skin self-exam. / Monthly.  Influenza vaccine. / Every year.  Tetanus, diphtheria, and acellular pertussis (Tdap/Td) vaccine.** / 1 dose of Td every 10 years.  Varicella vaccine.** / Consult your health care provider.  Zoster vaccine.** / 1 dose for adults aged 26 years or older.  Pneumococcal 13-valent conjugate (PCV13) vaccine.** / Consult your health care provider.  Pneumococcal polysaccharide (PPSV23) vaccine.** / 1 dose for all adults aged 48 years and older.  Meningococcal vaccine.** / Consult your health care provider.  Hepatitis A vaccine.** / Consult your health care  provider.  Hepatitis B vaccine.** / Consult your health care provider.  Haemophilus influenzae type b (Hib) vaccine.** / Consult your health care provider. **Family history and personal history of risk and conditions may change your health care provider's recommendations. Document Released: 08/12/2001 Document Revised: 06/21/2013 Document Reviewed: 11/11/2010 [facility] Patient Information 2015 [place], [place]. This information is not intended to replace advice given to you by your health care provider. Make sure you discuss any questions you have with your health care provider.

## 2014-12-04 NOTE — Progress Notes (Signed)
Pre visit review using our clinic review tool, if applicable. No additional management support is needed unless otherwise documented below in the visit note. 

## 2014-12-04 NOTE — Assessment & Plan Note (Addendum)
Td 2014 pneumonia shot 11-08 prevnar 2015 zostavax 2007  Female care per Dr. Gertie Fey retired, Dr. Ileene Rubens new gyn - saw 11/09/14,  DEXA 10-2013 normal   Cscope 10-2005 (-) @ [facility], next 2017    Counseled--diet, exercise, ca and vit D

## 2014-12-04 NOTE — Progress Notes (Signed)
Subjective:    Patient ID: Monique [name], female    DOB: [birth year], 74 y.o.   MRN: [id number]  DOS:  12/04/2014 Type of visit - description :    Here for Medicare AWV: 1. Risk factors based on Past M, S, F history: reviewed 2. Physical Activities:  Active, care giver for elderly fam members  3. Depression/mood:  (-)  screening 4. Hearing:  No problemss noted or reported , plans to get tested anyway 5. ADL's:  Totally independent  , drives 6. Fall Risk: did have a fall, from a ladder (!), cleaning house:  prevention discussed, had no major injuries from fall  7. home Safety: does feel safe at home   8. Height, weight, &visual acuity: see VS, uses glasses, has an eye doctor f/u for July 2016   9. Counseling: provided 10. Labs ordered based on risk factors: if needed   11. Referral Coordination: if needed 12.  Care Plan, see assessment and plan   13.   Cognitive Assessment: Motor skills and cognition within normal for age 28. Care team updated, Dr Ileene Rubens 15. End of life care discussed , has a healthcare POA  In addition, today we discussed the following:  Hypertension: Good compliance with medications, denies any chest pain/SOB or headaches.   Pre-Diabetes: Patient is doing better with diet and exercise, states she is working to watch what she eats. 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   Allergies: Patient notes she has been having difficulties with allergies over the past few months and that she saw Dr. Donneta Romberg but today is feeling much better. She has benadryl and Zyrtec which she takes when she needs it, not every day. She also has an Epi-Pen which she keeps with her.  Back pain: Patient notes mild lingering back pain. She was prescribed Diazepam and told to take that and Tylenol as needed for the pain, but she has not needed to use either recently.      Review of Systems  Constitutional: No fever. No chills. No unexplained wt changes. No  unusual sweats  HEENT: No dental problems, no ear discharge, no facial swelling, no voice changes. No eye discharge, no eye redness, no  intolerance to light   Respiratory: No wheezing, no difficulty breathing. No cough, no mucus production  Cardiovascular: No CP, no leg swelling, no palpitations  GI: no nausea, no vomiting, no diarrhea, no abdominal pain.  No blood in the stools. No dysphagia, no odynophagia    Endocrine: No polyphagia, no polyuria, no polydipsia  GU: No dysuria, gross hematuria, difficulty urinating. No urinary urgency, no frequency.  Musculoskeletal: No joint swellings or unusual aches or pains  Skin: No change in the color of the skin, pallor, no rash  Allergic, immunologic: Significant environmental and food allergies  Neurological: No dizziness no syncope. No headaches. No diplopia, no slurred speech, no motor deficits, no facial numbness  Hematological: No enlarged lymph nodes, no easy bruising, no unusual bleedings  Psychiatry: No suicidal ideas, no hallucinations, no behavior problems, no confusion.  No unusual/severe anxiety, no depression    Past Medical History  Diagnosis Date  . Hypertension 09/29/2006  . Cholelithiasis   . Headache(784.0)   . Anxiety   . Thrombocytopenia 08/27/2011    hematology evaluation 08-2011, return prn  . Dry eye syndrome   . DJD (degenerative joint disease)     Past Surgical History  Procedure Laterality Date  . Appendectomy    .  Abdominal hysterectomy      no oophorectomy  . Tonsillectomy      History   Social History  . Marital Status: Married    Spouse Name: N/A  . Number of Children: 2  . Years of Education: N/A   Occupational History  . 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 Smoking status: Never Smoker   . Smokeless tobacco: Never Used  . Alcohol Use: No  . Drug Use: No  . Sexual Activity: Not on file   Other Topics Concern  . Not on file   Social History  Narrative   husband has prostate ca , passed away 10-16-13   takes care of mom, 41 y/o who is blind , and an uncle            Family History  Problem Relation Age of Onset  . Coronary artery disease Father   . Diabetes Other     uncles   . Hypertension Mother   . Prostate cancer Other     grandfather  . Breast cancer Sister 10  . Colon cancer Neg Hx   . Anxiety disorder Mother   . Depression Mother   . Depression Maternal Uncle          Medication List       This list is accurate as of: 12/04/14 11:00 AM.  Always use your most recent med list.               aspirin 81 MG tablet  Take 81 mg by mouth daily with breakfast.     CALCIUM + D PO  Take 2 tablets by mouth daily with lunch.     carvedilol 6.25 MG tablet  Commonly known as:  COREG  Take 1 tablet (6.25 mg total) by mouth 2 (two) times daily with a meal.     cetirizine 10 MG tablet  Commonly known as:  ZYRTEC  Take 1 tablet by mouth daily as needed.     cycloSPORINE 0.05 % ophthalmic emulsion  Commonly known as:  RESTASIS  Place 1 drop into both eyes every 12 (twelve) hours.     diazepam 5 MG tablet  Commonly known as:  VALIUM  Take 1 tablet (5 mg total) by mouth every 12 (twelve) hours as needed for muscle spasms.     diphenhydrAMINE 25 MG tablet  Commonly known as:  BENADRYL  Take 25 mg by mouth every 6 (six) hours as needed for allergies.     EPINEPHrine 0.3 mg/0.3 mL Soaj injection  Commonly known as:  EPIPEN 2-PAK  Inject 0.3 mLs (0.3 mg total) into the muscle once.     famotidine 20 MG tablet  Commonly known as:  PEPCID  Take 1 tablet (20 mg total) by mouth 2 (two) times daily.     glucosamine-chondroitin 500-400 MG tablet  Take 1 tablet by mouth 3 (three) times daily.     ipratropium 0.06 % nasal spray  Commonly known as:  ATROVENT  Place 2 sprays into both nostrils every 4 (four) hours as needed for rhinitis.     meloxicam 7.5 MG tablet  Commonly known as:  MOBIC  Take 1 tablet (7.5  mg total) by mouth daily.     multivitamin with minerals Tabs tablet  Take 1 tablet by mouth daily with breakfast.     spironolactone 25 MG tablet  Commonly known as:  ALDACTONE  Take 1 tablet (25  mg total) by mouth daily.     SYSTANE ULTRA 0.4-0.3 % Soln  Generic drug:  Polyethyl Glycol-Propyl Glycol  Place 1 drop into both eyes 3 (three) times daily as needed (dry eyes).           Objective:   Physical Exam BP 132/82 mmHg  Pulse 79  Temp(Src) 97.6 F (36.4 C) (Oral)  Ht 5\' 2"  (1.575 m)  Wt 166 lb 4 oz (75.411 kg)  BMI 30.40 kg/m2  SpO2 97%  General:   Well developed, well nourished . NAD.  Neck:  Full range of motion. Supple. No thyromegaly, normal carotid pulse HEENT:  Normocephalic . Face symmetric, atraumatic Lungs:  CTA B Normal respiratory effort, no intercostal retractions, no accessory muscle use. Heart: RRR,  no murmur.  No pretibial edema bilaterally  Abdomen:  Not distended, soft, non-tender. No rebound or rigidity. No mass,organomegaly Skin: Exposed areas without rash. Not pale. Not jaundice Neurologic:  alert & oriented X3.  Speech normal, gait appropriate for age and unassisted Strength symmetric and appropriate for age.  Psych: Cognition and judgment appear intact.  Cooperative with normal attention span and concentration.  Behavior appropriate. No anxious or depressed appearing.       Assessment & Plan:   (Patient seen along with   Aletta Edouard, medical student)   Hypertension: Well-controlled on current medications, today BP was 132/82. Will check CMP today. No changes to meds.  Pre-Diabetes: Patient notes she has been working on [employer] and increasing exercise. A1c last year 5.9 Will check A1c today.  Allergies: Patient had issues with allergies in the past, has seen Dr. Donneta Romberg regarding this condition. Could not locate encounter notes in EPIC, patient states she is feeling significantly better than she used to. Currently  has Zyrtec and Benadryl to take as needed. Has epi-pen to be used if needed.  Plan: No changes to medications.  Back Pain: Patient notes that her back pain has mostly subsided. She does note a mild lingering pain which she is not currently taking any medications for. She has Valium to be taken prn but she has not needed it lately. Plan: observe, on no meds    Thrombocytopenia:  No problems of increased bleeding or petechial hemorrhages, will check CBC today.

## 2014-12-06 ENCOUNTER — Ambulatory Visit (INDEPENDENT_AMBULATORY_CARE_PROVIDER_SITE_OTHER): Payer: Medicare Other | Admitting: Internal Medicine

## 2014-12-06 VITALS — BP 128/64 | HR 90 | Temp 98.0°F | Ht 62.0 in | Wt 166.2 lb

## 2014-12-06 DIAGNOSIS — H6123 Impacted cerumen, bilateral: Secondary | ICD-10-CM | POA: Diagnosis not present

## 2014-12-06 NOTE — Progress Notes (Signed)
Subjective:    Patient ID: Monique [name], female    DOB: 06/22/1941, 74 y.o.   MRN: [id number]  DOS:  12/06/2014 Type of visit - description : acute Interval history: Sent to the office by the audiologist due to cerumen impaction.     Review of Systems Denies ear pain, ear discharge or recent URI symptoms  Past Medical History  Diagnosis Date  . Hypertension 09/29/2006  . Cholelithiasis   . Headache(784.0)   . Anxiety   . Thrombocytopenia 08/27/2011    hematology evaluation 08-2011, return prn  . Dry eye syndrome   . DJD (degenerative joint disease)     Past Surgical History  Procedure Laterality Date  . Appendectomy    . Abdominal hysterectomy      no oophorectomy  . Tonsillectomy      History   Social History  . Marital Status: Married    Spouse Name: N/A  . Number of Children: 2  . Years of Education: N/A   Occupational History  . 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 Smoking status: Never Smoker   . Smokeless tobacco: Never Used  . Alcohol Use: No  . Drug Use: No  . Sexual Activity: Not on file   Other Topics Concern  . Not on file   Social History Narrative   husband has prostate ca , passed away 2013-10-09   takes care of mom, 52 y/o who is blind , and an uncle                Medication List       This list is accurate as of: 12/06/14  2:56 PM.  Always use your most recent med list.               aspirin 81 MG tablet  Take 81 mg by mouth daily with breakfast.     CALCIUM + D PO  Take 2 tablets by mouth daily with lunch.     carvedilol 6.25 MG tablet  Commonly known as:  COREG  Take 1 tablet (6.25 mg total) by mouth 2 (two) times daily with a meal.     cetirizine 10 MG tablet  Commonly known as:  ZYRTEC  Take 1 tablet by mouth daily as needed.     cycloSPORINE 0.05 % ophthalmic emulsion  Commonly known as:  RESTASIS  Place 1 drop into both eyes every 12 (twelve) hours.     diphenhydrAMINE 25 MG tablet    Commonly known as:  BENADRYL  Take 25 mg by mouth every 6 (six) hours as needed for allergies.     EPINEPHrine 0.3 mg/0.3 mL Soaj injection  Commonly known as:  EPIPEN 2-PAK  Inject 0.3 mLs (0.3 mg total) into the muscle once.     famotidine 20 MG tablet  Commonly known as:  PEPCID  Take 1 tablet (20 mg total) by mouth 2 (two) times daily.     glucosamine-chondroitin 500-400 MG tablet  Take 1 tablet by mouth 3 (three) times daily.     ipratropium 0.06 % nasal spray  Commonly known as:  ATROVENT  Place 2 sprays into both nostrils every 4 (four) hours as needed for rhinitis.     multivitamin with minerals Tabs tablet  Take 1 tablet by mouth daily with breakfast.     spironolactone 25 MG tablet  Commonly known as:  ALDACTONE  Take 1 tablet (25 mg total) by  mouth daily.     SYSTANE ULTRA 0.4-0.3 % Soln  Generic drug:  Polyethyl Glycol-Propyl Glycol  Place 1 drop into both eyes 3 (three) times daily as needed (dry eyes).           Objective:   Physical Exam BP 128/64 mmHg  Pulse 90  Temp(Src) 98 F (36.7 C) (Oral)  Ht 5\' 2"  (1.575 m)  Wt 166 lb 4 oz (75.411 kg)  BMI 30.40 kg/m2  SpO2 97%  General:   Well developed, well nourished . NAD.  HEENT:  Normocephalic . Face symmetric, atraumatic Ears impacted with cerumen  Psych--  Cognition and judgment appear intact.  Cooperative with normal attention span and concentration.  Behavior appropriate. No anxious or depressed appearing.       Assessment & Plan:    1. Wax removed from both ears w/ a spoon by me, unable to clear all the cerumen. A lavage was performed, L clear, R w/ minimal residual rec H2O2 prn.  2. All labs discussed

## 2014-12-06 NOTE — Progress Notes (Signed)
Pre visit review using our clinic review tool, if applicable. No additional management support is needed unless otherwise documented below in the visit note. 

## 2015-01-17 DIAGNOSIS — E119 Type 2 diabetes mellitus without complications: Secondary | ICD-10-CM | POA: Diagnosis not present

## 2015-01-17 DIAGNOSIS — H04129 Dry eye syndrome of unspecified lacrimal gland: Secondary | ICD-10-CM | POA: Diagnosis not present

## 2015-01-17 DIAGNOSIS — H25099 Other age-related incipient cataract, unspecified eye: Secondary | ICD-10-CM | POA: Diagnosis not present

## 2015-01-17 LAB — HM DIABETES EYE EXAM

## 2015-01-29 ENCOUNTER — Encounter: Payer: Self-pay | Admitting: *Deleted

## 2015-01-31 ENCOUNTER — Encounter: Payer: Self-pay | Admitting: *Deleted

## 2015-02-23 DIAGNOSIS — H0014 Chalazion left upper eyelid: Secondary | ICD-10-CM | POA: Diagnosis not present

## 2015-03-12 DIAGNOSIS — J300 Vasomotor rhinitis: Secondary | ICD-10-CM | POA: Diagnosis not present

## 2015-03-12 DIAGNOSIS — L509 Urticaria, unspecified: Secondary | ICD-10-CM | POA: Diagnosis not present

## 2015-03-19 ENCOUNTER — Other Ambulatory Visit: Payer: Self-pay

## 2015-04-06 DIAGNOSIS — Z23 Encounter for immunization: Secondary | ICD-10-CM | POA: Diagnosis not present

## 2015-05-01 ENCOUNTER — Other Ambulatory Visit: Payer: Self-pay | Admitting: Internal Medicine

## 2015-06-08 DIAGNOSIS — H0014 Chalazion left upper eyelid: Secondary | ICD-10-CM | POA: Diagnosis not present

## 2015-06-15 DIAGNOSIS — H0014 Chalazion left upper eyelid: Secondary | ICD-10-CM | POA: Diagnosis not present

## 2015-06-27 DIAGNOSIS — H0014 Chalazion left upper eyelid: Secondary | ICD-10-CM | POA: Diagnosis not present

## 2015-07-03 MED FILL — RESTASIS 0.05% EYE EMULSION: 0.05 | 30 days supply | Qty: 60 | Fill #1 | Status: TO

## 2015-08-06 ENCOUNTER — Ambulatory Visit (INDEPENDENT_AMBULATORY_CARE_PROVIDER_SITE_OTHER): Payer: Medicare Other | Admitting: Internal Medicine

## 2015-08-06 ENCOUNTER — Encounter: Payer: Self-pay | Admitting: Internal Medicine

## 2015-08-06 VITALS — BP 106/84 | HR 74 | Temp 97.4°F | Ht 62.0 in | Wt 173.4 lb

## 2015-08-06 DIAGNOSIS — I10 Essential (primary) hypertension: Secondary | ICD-10-CM | POA: Diagnosis not present

## 2015-08-06 DIAGNOSIS — E785 Hyperlipidemia, unspecified: Secondary | ICD-10-CM | POA: Diagnosis not present

## 2015-08-06 LAB — BASIC METABOLIC PANEL
BUN: 13 mg/dL (ref 6–23)
CO2: 29 mEq/L (ref 19–32)
Calcium: 9.9 mg/dL (ref 8.4–10.5)
Chloride: 105 mEq/L (ref 96–112)
Creatinine, Ser: 0.96 mg/dL (ref 0.40–1.20)
GFR: 72.85 mL/min (ref >60.00)
Glucose, Bld: 101 mg/dL — ABNORMAL HIGH (ref 70–99)
Potassium: 4.2 mEq/L (ref 3.5–5.1)
Sodium: 143 mEq/L (ref 135–145)

## 2015-08-06 LAB — LIPID PANEL
Cholesterol: 162 mg/dL (ref 0–200)
HDL: 55.7 mg/dL (ref >39.00)
LDL Cholesterol: 90 mg/dL (ref 0–99)
NonHDL: 106.55
Total CHOL/HDL Ratio: 3
Triglycerides: 83 mg/dL (ref 0.0–149.0)
VLDL: 16.6 mg/dL (ref 0.0–40.0)

## 2015-08-06 MED ORDER — CARVEDILOL 6.25 MG PO TABS: 6.2500 mg | ORAL_TABLET | Freq: Two times a day (BID) | ORAL | Status: DC

## 2015-08-06 MED ORDER — SPIRONOLACTONE 25 MG PO TABS: 25.0000 mg | ORAL_TABLET | Freq: Every day | ORAL | Status: DC

## 2015-08-06 MED FILL — SPIRONOLACTONE 25 MG TABLET: 25 | 90 days supply | Qty: 90 | Fill #0

## 2015-08-06 MED FILL — CARVEDILOL 6.25 MG TABLET: 6.25 | 90 days supply | Qty: 180 | Fill #0

## 2015-08-06 NOTE — Progress Notes (Signed)
Subjective:    Patient ID: Monique [name], female    DOB: May 27, 1941, 75 y.o.   MRN: [id number]  DOS:  08/06/2015 Type of visit - description : ROV Interval history: In general doing well, good compliance with medications. No ambulatory BPs. BP today is very good.   Review of Systems Denies chest pain, difficulty breathing. No nausea, vomiting, diarrhea.  Past Medical History  Diagnosis Date  . Hypertension 09/29/2006  . Cholelithiasis   . Headache(784.0)   . Anxiety   . Thrombocytopenia ([place]) 08/27/2011    hematology evaluation 08-2011, return prn  . Dry eye syndrome   . DJD (degenerative joint disease)   . Urticaria   . Vasomotor rhinitis     Past Surgical History  Procedure Laterality Date  . Appendectomy    . Abdominal hysterectomy      no oophorectomy  . Tonsillectomy      Social History   Social History  . Marital Status: Married    Spouse Name: N/A  . Number of Children: 2  . Years of Education: N/A   Occupational History  . 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 Smoking status: Never Smoker   . Smokeless tobacco: Never Used  . Alcohol Use: No  . Drug Use: No  . Sexual Activity: Not on file   Other Topics Concern  . Not on file   Social History Narrative   husband has prostate ca , passed away September 02, 2013   takes care of mom, 33 y/o who is blind , and an uncle                Medication List       This list is accurate as of: 08/06/15  7:12 PM.  Always use your most recent med list.               aspirin 81 MG tablet  Take 81 mg by mouth daily with breakfast.     CALCIUM + D PO  Take 2 tablets by mouth daily with lunch.     carvedilol 6.25 MG tablet  Commonly known as:  COREG  Take 1 tablet (6.25 mg total) by mouth 2 (two) times daily with a meal.     cetirizine 10 MG tablet  Commonly known as:  ZYRTEC  Take 1 tablet by mouth daily as needed.     cycloSPORINE 0.05 % ophthalmic emulsion  Commonly known  as:  RESTASIS  Place 1 drop into both eyes every 12 (twelve) hours.     diphenhydrAMINE 25 MG tablet  Commonly known as:  BENADRYL  Take 25 mg by mouth every 6 (six) hours as needed for allergies. Reported on 08/06/2015     EPINEPHrine 0.3 mg/0.3 mL Soaj injection  Commonly known as:  EPIPEN 2-PAK  Inject 0.3 mLs (0.3 mg total) into the muscle once.     famotidine 20 MG tablet  Commonly known as:  PEPCID  Take 1 tablet (20 mg total) by mouth 2 (two) times daily.     glucosamine-chondroitin 500-400 MG tablet  Take 1 tablet by mouth 3 (three) times daily.     ipratropium 0.06 % nasal spray  Commonly known as:  ATROVENT  Place 2 sprays into both nostrils every 4 (four) hours as needed for rhinitis.     montelukast 10 MG tablet  Commonly known as:  SINGULAIR  Take 1 tablet (10 mg total) by mouth  at bedtime.     multivitamin with minerals Tabs tablet  Take 1 tablet by mouth daily with breakfast.     spironolactone 25 MG tablet  Commonly known as:  ALDACTONE  Take 1 tablet (25 mg total) by mouth daily.     SYSTANE ULTRA 0.4-0.3 % Soln  Generic drug:  Polyethyl Glycol-Propyl Glycol  Place 1 drop into both eyes 3 (three) times daily as needed (dry eyes).           Objective:   Physical Exam BP 106/84 mmHg  Pulse 74  Temp(Src) 97.4 F (36.3 C) (Oral)  Ht 5\' 2"  (1.575 m)  Wt 173 lb 6 oz (78.642 kg)  BMI 31.70 kg/m2  SpO2 97% General:   Well developed, well nourished . NAD.  HEENT:  Normocephalic . Face symmetric, atraumatic Lungs:  CTA B Normal respiratory effort, no intercostal retractions, no accessory muscle use. Heart: RRR,  no murmur.  No pretibial edema bilaterally  Skin: Not pale. Not jaundice Neurologic:  alert & oriented X3.  Speech normal, gait appropriate for age and unassisted Psych--  Cognition and judgment appear intact.  Cooperative with normal attention span and concentration.  Behavior appropriate. No anxious or depressed appearing.        Assessment & Plan:   Assessment Prediabetes HTN Hyperlipidemia Thrombocytopenia, hematology eval 2013, RTC PRN Allergic rhinitis, urticaria Dry eye syndrome H/o Anxiety: d/t husband's health (lost husband 2015) H/o Cholelithiasis  Plan: HTN: Well control, check a BMP, refill Aldactone, continue carvedilol. High chol: Doing well with diet and exercise, check FLP. Primary Care, had a flu shot already RTC 6 months

## 2015-08-06 NOTE — Progress Notes (Signed)
Pre visit review using our clinic review tool, if applicable. No additional management support is needed unless otherwise documented below in the visit note. 

## 2015-08-06 NOTE — Patient Instructions (Signed)
GO TO THE LAB : Get the blood work    GO TO THE FRONT DESK Schedule a complete physical exam to be done in in 6 months  No  fasting

## 2015-08-17 MED FILL — RESTASIS 0.05% EYE EMULSION: 0.05 | 30 days supply | Qty: 60 | Fill #2 | Status: TO

## 2015-09-17 ENCOUNTER — Encounter: Payer: Self-pay | Admitting: Internal Medicine

## 2015-09-21 DIAGNOSIS — L509 Urticaria, unspecified: Secondary | ICD-10-CM | POA: Diagnosis not present

## 2015-09-21 DIAGNOSIS — J300 Vasomotor rhinitis: Secondary | ICD-10-CM | POA: Diagnosis not present

## 2015-10-26 MED FILL — RESTASIS 0.05% EYE EMULSION: 0.05 | 30 days supply | Qty: 60 | Fill #3 | Status: TO

## 2015-11-22 MED FILL — SPIRONOLACTONE 25 MG TABLET: 25 | 90 days supply | Qty: 90 | Fill #1

## 2015-11-22 MED FILL — CARVEDILOL 6.25 MG TABLET: 6.25 | 90 days supply | Qty: 180 | Fill #1

## 2015-11-27 ENCOUNTER — Other Ambulatory Visit: Payer: Self-pay

## 2015-11-27 ENCOUNTER — Telehealth: Payer: Self-pay | Admitting: Internal Medicine

## 2015-11-27 DIAGNOSIS — Z1231 Encounter for screening mammogram for malignant neoplasm of breast: Secondary | ICD-10-CM | POA: Diagnosis not present

## 2015-11-27 DIAGNOSIS — L821 Other seborrheic keratosis: Secondary | ICD-10-CM | POA: Diagnosis not present

## 2015-11-27 DIAGNOSIS — D225 Melanocytic nevi of trunk: Secondary | ICD-10-CM | POA: Diagnosis not present

## 2015-11-27 DIAGNOSIS — D1801 Hemangioma of skin and subcutaneous tissue: Secondary | ICD-10-CM | POA: Diagnosis not present

## 2015-11-27 DIAGNOSIS — L918 Other hypertrophic disorders of the skin: Secondary | ICD-10-CM | POA: Diagnosis not present

## 2015-11-27 DIAGNOSIS — D2372 Other benign neoplasm of skin of left lower limb, including hip: Secondary | ICD-10-CM | POA: Diagnosis not present

## 2015-11-27 DIAGNOSIS — N959 Unspecified menopausal and perimenopausal disorder: Secondary | ICD-10-CM | POA: Diagnosis not present

## 2015-11-27 MED ORDER — EPINEPHRINE 0.3 MG/0.3ML IJ SOAJ: 0.3000 mg | Freq: Once | INTRAMUSCULAR | Status: AC

## 2015-11-27 NOTE — Telephone Encounter (Signed)
Rx sent 

## 2015-11-27 NOTE — Telephone Encounter (Signed)
Caller name:  Self  Can be reached: [phone]  Pharmacy:  [id number] - [place], [place] [phone] (Phone) [phone] (Fax)         Reason for call: Needs new EPINEPHrine (EPIPEN 2-PAK) 0.3 mg/0.3 mL IJ SOAJ injection [id number]

## 2016-01-11 ENCOUNTER — Encounter: Payer: Self-pay | Admitting: Internal Medicine

## 2016-02-04 ENCOUNTER — Ambulatory Visit (INDEPENDENT_AMBULATORY_CARE_PROVIDER_SITE_OTHER): Payer: Medicare Other | Admitting: Internal Medicine

## 2016-02-04 ENCOUNTER — Encounter: Payer: Self-pay | Admitting: Internal Medicine

## 2016-02-04 VITALS — BP 118/78 | HR 89 | Temp 97.9°F | Resp 14 | Ht 62.0 in | Wt 174.4 lb

## 2016-02-04 DIAGNOSIS — R7303 Prediabetes: Secondary | ICD-10-CM | POA: Diagnosis not present

## 2016-02-04 DIAGNOSIS — Z0000 Encounter for general adult medical examination without abnormal findings: Secondary | ICD-10-CM | POA: Diagnosis not present

## 2016-02-04 DIAGNOSIS — Z09 Encounter for follow-up examination after completed treatment for conditions other than malignant neoplasm: Secondary | ICD-10-CM | POA: Insufficient documentation

## 2016-02-04 DIAGNOSIS — I10 Essential (primary) hypertension: Secondary | ICD-10-CM

## 2016-02-04 DIAGNOSIS — E785 Hyperlipidemia, unspecified: Secondary | ICD-10-CM | POA: Diagnosis not present

## 2016-02-04 DIAGNOSIS — E118 Type 2 diabetes mellitus with unspecified complications: Secondary | ICD-10-CM | POA: Diagnosis not present

## 2016-02-04 LAB — BASIC METABOLIC PANEL
BUN: 14 mg/dL (ref 6–23)
CO2: 25 mEq/L (ref 19–32)
Calcium: 9.4 mg/dL (ref 8.4–10.5)
Chloride: 103 mEq/L (ref 96–112)
Creatinine, Ser: 0.91 mg/dL (ref 0.40–1.20)
GFR: 77.38 mL/min (ref >60.00)
Glucose, Bld: 123 mg/dL — ABNORMAL HIGH (ref 70–99)
Potassium: 3.8 mEq/L (ref 3.5–5.1)
Sodium: 140 mEq/L (ref 135–145)

## 2016-02-04 LAB — ALT: ALT: 18 U/L (ref 0–35)

## 2016-02-04 LAB — AST: AST: 21 U/L (ref 0–37)

## 2016-02-04 LAB — HEMOGLOBIN A1C: Hgb A1c MFr Bld: 6 % (ref 4.6–6.5)

## 2016-02-04 NOTE — Patient Instructions (Addendum)
GO TO THE LAB : Get the blood work    GO TO THE FRONT DESK Schedule your next appointment for a  physical exam in one year, fasting   Check the  blood pressure  monthly  Be sure your blood pressure is between 110/65 and  145/85. If it is consistently higher or lower, let me know    Fall Prevention and Home Safety  Falls cause injuries and can affect all age groups. It is possible to use preventive measures to significantly decrease the likelihood of falls. There are many simple measures which can make your home safer and prevent falls. OUTDOORS  Repair cracks and edges of walkways and driveways.  Remove high doorway thresholds.  Trim shrubbery on the main path into your home.  Have good outside lighting.  Clear walkways of tools, rocks, debris, and clutter.  Check that handrails are not broken and are securely fastened. Both sides of steps should have handrails.  Have leaves, snow, and ice cleared regularly.  Use sand or salt on walkways during winter months.  In the garage, clean up grease or oil spills. BATHROOM  Install night lights.  Install grab bars by the toilet and in the tub and shower.  Use non-skid mats or decals in the tub or shower.  Place a plastic non-slip stool in the shower to sit on, if needed.  Keep floors dry and clean up all water on the floor immediately.  Remove soap buildup in the tub or shower on a regular basis.  Secure bath mats with non-slip, double-sided rug tape.  Remove throw rugs and tripping hazards from the floors. BEDROOMS  Install night lights.  Make sure a bedside light is easy to reach.  Do not use oversized bedding.  Keep a telephone by your bedside.  Have a firm chair with side arms to use for getting dressed.  Remove throw rugs and tripping hazards from the floor. KITCHEN  Keep handles on pots and pans turned toward the center of the stove. Use back burners when possible.  Clean up spills quickly and allow  time for drying.  Avoid walking on wet floors.  Avoid hot utensils and knives.  Position shelves so they are not too high or low.  Place commonly used objects within easy reach.  If necessary, use a sturdy step stool with a grab bar when reaching.  Keep electrical cables out of the way.  Do not use floor polish or wax that makes floors slippery. If you must use wax, use non-skid floor wax.  Remove throw rugs and tripping hazards from the floor. STAIRWAYS  Never leave objects on stairs.  Place handrails on both sides of stairways and use them. Fix any loose handrails. Make sure handrails on both sides of the stairways are as long as the stairs.  Check carpeting to make sure it is firmly attached along stairs. Make repairs to worn or loose carpet promptly.  Avoid placing throw rugs at the top or bottom of stairways, or properly secure the rug with carpet tape to prevent slippage. Get rid of throw rugs, if possible.  Have an electrician put in a light switch at the top and bottom of the stairs. OTHER FALL PREVENTION TIPS  Wear low-heel or rubber-soled shoes that are supportive and fit well. Wear closed toe shoes.  When using a stepladder, make sure it is fully opened and both spreaders are firmly locked. Do not climb a closed stepladder.  Add color or contrast paint or  tape to grab bars and handrails in your home. Place contrasting color strips on first and last steps.  Learn and use mobility aids as needed. Install an electrical emergency response system.  Turn on lights to avoid dark areas. Replace light bulbs that burn out immediately. Get light switches that glow.  Arrange furniture to create clear pathways. Keep furniture in the same place.  Firmly attach carpet with non-skid or double-sided tape.  Eliminate uneven floor surfaces.  Select a carpet pattern that does not visually hide the edge of steps.  Be aware of all pets. OTHER HOME SAFETY TIPS  Set the water  temperature for 120 F (48.8 C).  Keep emergency numbers on or near the telephone.  Keep smoke detectors on every level of the home and near sleeping areas. Document Released: 06/06/2002 Document Revised: 12/16/2011 Document Reviewed: 09/05/2011 [facility] Patient Information 2015 [place], [place]. This information is not intended to replace advice given to you by your health care provider. Make sure you discuss any questions you have with your health care provider.   Preventive Care for Adults Ages 41 and over  Blood pressure check.** / Every 1 to 2 years.  Lipid and cholesterol check.**/ Every 5 years beginning at age 43.  Lung cancer screening. / Every year if you are aged 30-80 years and have a 30-pack-year history of smoking and currently smoke or have quit within the past 15 years. Yearly screening is stopped once you have quit smoking for at least 15 years or develop a health problem that would prevent you from having lung cancer treatment.  Fecal occult blood test (FOBT) of stool. / Every year beginning at age 65 and continuing until age 36. You may not have to do this test if you get a colonoscopy every 10 years.  Flexible sigmoidoscopy** or colonoscopy.** / Every 5 years for a flexible sigmoidoscopy or every 10 years for a colonoscopy beginning at age 71 and continuing until age 31.  Hepatitis C blood test.** / For all people born from [birth year] through 1965 and any individual with known risks for hepatitis C.  Abdominal aortic aneurysm (AAA) screening.** / A one-time screening for ages 60 to 45 years who are current or former smokers.  Skin self-exam. / Monthly.  Influenza vaccine. / Every year.  Tetanus, diphtheria, and acellular pertussis (Tdap/Td) vaccine.** / 1 dose of Td every 10 years.  Varicella vaccine.** / Consult your health care provider.  Zoster vaccine.** / 1 dose for adults aged 90 years or older.  Pneumococcal 13-valent conjugate (PCV13) vaccine.** / Consult  your health care provider.  Pneumococcal polysaccharide (PPSV23) vaccine.** / 1 dose for all adults aged 12 years and older.  Meningococcal vaccine.** / Consult your health care provider.  Hepatitis A vaccine.** / Consult your health care provider.  Hepatitis B vaccine.** / Consult your health care provider.  Haemophilus influenzae type b (Hib) vaccine.** / Consult your health care provider. **Family history and personal history of risk and conditions may change your health care provider's recommendations. Document Released: 08/12/2001 Document Revised: 06/21/2013 Document Reviewed: 11/11/2010 [facility] Patient Information 2015 [place], [place]. This information is not intended to replace advice given to you by your health care provider. Make sure you discuss any questions you have with your health care provider.

## 2016-02-04 NOTE — Progress Notes (Signed)
Subjective:    Patient ID: Monique [name], female    DOB: Jul 31, 1940, 75 y.o.   MRN: [id number]  DOS:  02/04/2016 Type of visit - description :  Interval history:   Here for Medicare AWV: 1. Risk factors based on Past M, S, F history: reviewed 2. Physical Activities:  Active, care giver for elderly fam members (x3)  3. Depression/mood:  (-)  screening 4. Hearing:  No problemss noted or reported , was tested 2016, no major issues 5. ADL's:  Totally independent  , drives 6. Fall Risk: no recent falls,  prevention discussed, had no major injuries from fall  7. home Safety: does feel safe at home   8. Height, weight, &visual acuity: see VS, uses glasses, sees the eye doctor  9. Counseling: provided 10. Labs ordered based on risk factors: if needed   11. Referral Coordination: if needed 12.  Care Plan, see assessment and plan   13.   Cognitive Assessment: Motor skills and cognition within normal for age 39. Care team updated  15. End of life care discussed , has a healthcare POA   In addition, today we discussed the following:  Prediabetes: She stays active and is trying to eat healthy HTN -- good compliance with medications, BPs when checked usually within normal. Hyperlipidemia: On diet control   Review of Systems Constitutional: No fever. No chills. No unexplained wt changes. No unusual sweats  HEENT: No dental problems, no ear discharge, no facial swelling, no voice changes. No eye discharge, no eye  redness , no  intolerance to light   Respiratory: No wheezing , no  difficulty breathing. No cough , no mucus production  Cardiovascular: No CP, no leg swelling , no  Palpitations  GI: no nausea, no vomiting, no diarrhea , no  abdominal pain.  No blood in the stools. No dysphagia, no odynophagia    Endocrine: No polyphagia, no polyuria , no polydipsia  GU: No dysuria, gross hematuria, difficulty urinating. No urinary urgency, no frequency.  Musculoskeletal: No joint  swellings or unusual aches or pains  Skin: No change in the color of the skin, palor , no  Rash  Allergic, immunologic: No environmental allergies , no  food allergies  Neurological: No dizziness no  syncope.  Occasional headaches, usually related to trying to read small letters, no associated nausea, no severe symptoms. She will see the eye doctor. No diplopia, no slurred, no slurred speech, no motor deficits, no facial  Numbness  Hematological: No enlarged lymph nodes, no easy bruising , no unusual bleedings  Psychiatry: No suicidal ideas, no hallucinations, no beavior problems, no confusion.  No unusual/severe anxiety, no depression   Past Medical History:  Diagnosis Date  . Anxiety   . Cholelithiasis   . DJD (degenerative joint disease)   . Dry eye syndrome   . Headache(784.0)   . Hyperlipidemia 01/20/2011  . Hypertension 09/29/2006  . Thrombocytopenia ([facility]) 08/27/2011   hematology evaluation 08-2011, return prn  . Urticaria   . Vasomotor rhinitis     Past Surgical History:  Procedure Laterality Date  . ABDOMINAL HYSTERECTOMY     no oophorectomy  . APPENDECTOMY    . TONSILLECTOMY      Social History   Social History  . Marital status: Widowed    Spouse name: N/A  . Number of children: 2  . Years of education: N/A   Occupational History  . retired Theme park manager      was a Theme park manager  Social History Main Topics  . Smoking status: Never Smoker  . Smokeless tobacco: Never Used  . Alcohol use No  . Drug use: No  . Sexual activity: Not on file   Other Topics Concern  . Not on file   Social History Narrative   husband had prostate ca , passed away 09/02/2013   takes care of mom, 51 y/o who is blind , and two other fam members             Family History  Problem Relation Age of Onset  . Coronary artery disease Father   . Diabetes Other     uncles   . Hypertension Mother   . Anxiety disorder Mother   . Depression Mother   . Prostate cancer Other     grandfather  .  Breast cancer Sister 50  . Depression Maternal Uncle   . Colon cancer Neg Hx        Medication List       Accurate as of 02/04/16  5:02 PM. Always use your most recent med list.          aspirin 81 MG tablet Take 81 mg by mouth daily with breakfast.   CALCIUM + D PO Take 2 tablets by mouth daily with lunch.   carvedilol 6.25 MG tablet Commonly known as:  COREG Take 1 tablet (6.25 mg total) by mouth 2 (two) times daily with a meal.   cetirizine 10 MG tablet Commonly known as:  ZYRTEC Take 1 tablet by mouth daily as needed.   cycloSPORINE 0.05 % ophthalmic emulsion Commonly known as:  RESTASIS Place 1 drop into both eyes every 12 (twelve) hours.   diphenhydrAMINE 25 MG tablet Commonly known as:  BENADRYL Take 25 mg by mouth every 6 (six) hours as needed for allergies. Reported on 08/06/2015   EPINEPHrine 0.3 mg/0.3 mL Soaj injection Commonly known as:  EPIPEN 2-PAK Inject 0.3 mLs (0.3 mg total) into the muscle once.   famotidine 20 MG tablet Commonly known as:  PEPCID Take 1 tablet (20 mg total) by mouth 2 (two) times daily.   fluticasone 50 MCG/ACT nasal spray Commonly known as:  FLONASE Place 1-2 sprays into both nostrils daily as needed for allergies or rhinitis.   glucosamine-chondroitin 500-400 MG tablet Take 1 tablet by mouth 3 (three) times daily.   ipratropium 0.06 % nasal spray Commonly known as:  ATROVENT Place 2 sprays into both nostrils every 4 (four) hours as needed for rhinitis.   montelukast 10 MG tablet Commonly known as:  SINGULAIR Take 1 tablet (10 mg total) by mouth at bedtime.   multivitamin with minerals Tabs tablet Take 1 tablet by mouth daily with breakfast.   spironolactone 25 MG tablet Commonly known as:  ALDACTONE Take 1 tablet (25 mg total) by mouth daily.   SYSTANE ULTRA 0.4-0.3 % Soln Generic drug:  Polyethyl Glycol-Propyl Glycol Place 1 drop into both eyes 3 (three) times daily as needed (dry eyes).          Objective:    Physical Exam BP 118/78 (BP Location: Left Arm, Patient Position: Sitting, Cuff Size: Normal)   Pulse 89   Temp 97.9 F (36.6 C) (Oral)   Resp 14   Ht 5\' 2"  (1.575 m)   Wt 174 lb 6 oz (79.1 kg)   SpO2 97%   BMI 31.89 kg/m   General:   Well developed, well nourished . NAD.  Neck: No  thyromegaly  HEENT:  Normocephalic .  Face symmetric, atraumatic Lungs:  CTA B Normal respiratory effort, no intercostal retractions, no accessory muscle use. Heart: RRR,  no murmur.  No pretibial edema bilaterally  Abdomen:  Not distended, soft, non-tender. No rebound or rigidity.   Skin: Exposed areas without rash. Not pale. Not jaundice Neurologic:  alert & oriented X3.  Speech normal, gait appropriate for age and unassisted Strength symmetric and appropriate for age.  Psych: Cognition and judgment appear intact.  Cooperative with normal attention span and concentration.  Behavior appropriate. No anxious or depressed appearing.    Assessment & Plan:    Assessment Prediabetes HTN Hyperlipidemia Thrombocytopenia, hematology eval 2013, RTC PRN Allergic rhinitis, urticaria-- Dr Donneta Romberg Dry eye syndrome H/o Anxiety: d/t husband's health (lost husband 34) H/o Cholelithiasis  PLAN: Prediabetes: Check A1c, continue with healthy lifestyle HTN: Continue spironolactone and carvedilol, check a BMP. Hyperlipidemia: Diet control, last FLP satisfactory Headache: As described above, will see her eye doctor, if not better she knows to call me. RTC one year

## 2016-02-04 NOTE — Progress Notes (Signed)
Pre visit review using our clinic review tool, if applicable. No additional management support is needed unless otherwise documented below in the visit note. 

## 2016-02-04 NOTE — Assessment & Plan Note (Signed)
Prediabetes: Check A1c, continue with healthy lifestyle HTN: Continue spironolactone and carvedilol, check a BMP. Hyperlipidemia: Diet control, last FLP satisfactory Headache: As described above, will see her eye doctor, if not better she knows to call me. RTC one year

## 2016-02-04 NOTE — Assessment & Plan Note (Addendum)
Td 2014;  pneumonia shot 11-08;  prevnar 2015;  zostavax 2007; recommend a flu shot this season  Female care per  Dr. Ileene Rubens,  saw them 10-2015 Last [facility] (no documentation yet) ~ 10-2015  DEXA 10-2013 normal , on Ca and Vit D  Cscope 10-2005 (-) @ [facility], next 2017, already schedule per pt    Counseled--diet, exercise, ca and vit D

## 2016-02-14 DIAGNOSIS — E119 Type 2 diabetes mellitus without complications: Secondary | ICD-10-CM | POA: Diagnosis not present

## 2016-02-14 LAB — HM DIABETES EYE EXAM

## 2016-02-26 ENCOUNTER — Encounter: Payer: Self-pay | Admitting: Internal Medicine

## 2016-03-04 DIAGNOSIS — Z23 Encounter for immunization: Secondary | ICD-10-CM | POA: Diagnosis not present

## 2016-03-06 ENCOUNTER — Ambulatory Visit (AMBULATORY_SURGERY_CENTER): Payer: Self-pay | Admitting: *Deleted

## 2016-03-06 VITALS — Ht 62.0 in | Wt 171.0 lb

## 2016-03-06 DIAGNOSIS — Z1211 Encounter for screening for malignant neoplasm of colon: Secondary | ICD-10-CM

## 2016-03-06 MED ORDER — NA SULFATE-K SULFATE-MG SULF 17.5-3.13-1.6 GM/177ML PO SOLN: 1.0000 | Freq: Once | ORAL | 0 refills | Status: AC

## 2016-03-06 NOTE — Progress Notes (Signed)
No egg or soy allergy known to patient  No issues with past sedation with any surgeries  or procedures, no intubation problems  No diet pills per patient No home 02 use per patient  No blood thinners per patient  Pt denies issues with constipation  No A fib or A flutter   

## 2016-03-20 ENCOUNTER — Ambulatory Visit (AMBULATORY_SURGERY_CENTER): Payer: Medicare Other | Admitting: Internal Medicine

## 2016-03-20 ENCOUNTER — Encounter: Payer: Self-pay | Admitting: Internal Medicine

## 2016-03-20 VITALS — BP 153/86 | HR 71 | Temp 96.4°F | Resp 15 | Ht 62.0 in | Wt 171.0 lb

## 2016-03-20 DIAGNOSIS — Z1211 Encounter for screening for malignant neoplasm of colon: Secondary | ICD-10-CM

## 2016-03-20 DIAGNOSIS — I10 Essential (primary) hypertension: Secondary | ICD-10-CM | POA: Diagnosis not present

## 2016-03-20 DIAGNOSIS — D123 Benign neoplasm of transverse colon: Secondary | ICD-10-CM | POA: Diagnosis not present

## 2016-03-20 DIAGNOSIS — K219 Gastro-esophageal reflux disease without esophagitis: Secondary | ICD-10-CM | POA: Diagnosis not present

## 2016-03-20 MED ORDER — SODIUM CHLORIDE 0.9 % IV SOLN: 500.0000 mL | INTRAVENOUS | Status: DC

## 2016-03-20 MED ORDER — FLEET ENEMA 7-19 GM/118ML RE ENEM
1.0000 | ENEMA | Freq: Once | RECTAL | Status: AC
  Administered 2016-03-20: 1 via RECTAL

## 2016-03-20 NOTE — Patient Instructions (Signed)
Colon polyp removed today. Handout given on polyps, diverticulosis,high fiber diet. Result letter in your mail in 2-3 weeks. Resume current medications. Call us with any questions or concerns. Thank you!  YOU HAD AN ENDOSCOPIC PROCEDURE TODAY AT [facility]:   Refer to the procedure report that was given to you for any specific questions about what was found during the examination.  If the procedure report does not answer your questions, please call your gastroenterologist to clarify.  If you requested that your care partner not be given the details of your procedure findings, then the procedure report has been included in a sealed envelope for you to review at your convenience later.  YOU SHOULD EXPECT: Some feelings of bloating in the abdomen. Passage of more gas than usual.  Walking can help get rid of the air that was put into your GI tract during the procedure and reduce the bloating. If you had a lower endoscopy (such as a colonoscopy or flexible sigmoidoscopy) you may notice spotting of blood in your stool or on the toilet paper. If you underwent a bowel prep for your procedure, you may not have a normal bowel movement for a few days.  Please Note:  You might notice some irritation and congestion in your nose or some drainage.  This is from the oxygen used during your procedure.  There is no need for concern and it should clear up in a day or so.  SYMPTOMS TO REPORT IMMEDIATELY:   Following lower endoscopy (colonoscopy or flexible sigmoidoscopy):  Excessive amounts of blood in the stool  Significant tenderness or worsening of abdominal pains  Swelling of the abdomen that is new, acute  Fever of 100F or higher   Following upper endoscopy (EGD)  Vomiting of blood or coffee ground material  New chest pain or pain under the shoulder blades  Painful or persistently difficult swallowing  New shortness of breath  Fever of 100F or higher  Black, tarry-looking stools  For  urgent or emergent issues, a gastroenterologist can be reached at any hour by calling [phone].   DIET:  We do recommend a small meal at first, but then you may proceed to your regular diet.  Drink plenty of fluids but you should avoid alcoholic beverages for 24 hours.  ACTIVITY:  You should plan to take it easy for the rest of today and you should NOT DRIVE or use heavy machinery until tomorrow (because of the sedation medicines used during the test).    FOLLOW UP: Our staff will call the number listed on your records the next business day following your procedure to check on you and address any questions or concerns that you may have regarding the information given to you following your procedure. If we do not reach you, we will leave a message.  However, if you are feeling well and you are not experiencing any problems, there is no need to return our call.  We will assume that you have returned to your regular daily activities without incident.  If any biopsies were taken you will be contacted by phone or by letter within the next 1-3 weeks.  Please call us at [phone] if you have not heard about the biopsies in 3 weeks.    SIGNATURES/CONFIDENTIALITY: You and/or your care partner have signed paperwork which will be entered into your electronic medical record.  These signatures attest to the fact that that the information above on your After Visit Summary has been  reviewed and is understood.  Full responsibility of the confidentiality of this discharge information lies with you and/or your care-partner. 

## 2016-03-20 NOTE — Progress Notes (Signed)
Report to PACU, RN, vss, BBS= Clear.  Toward end of procedure (in sigmoid, approx 0855) pt noticed to be wrethcing/coughing, airway opened small amount of bile looking fluid out, also some noticed in N/C, head immediately dropped in t burg and suctioned and sedation stopped.  Dr pyrtle aware and PACU nurse aware.  I did talk to pt but she was a little drowsy

## 2016-03-20 NOTE — Op Note (Signed)
[facility] Patient Name: Monique [name] Procedure Date: 03/20/2016 8:37 AM MRN: [id number] Endoscopist: Jerene Bears , MD Age: 75 Referring MD:  Date of Birth: 1940-09-14 Gender: Female Account #: [id number] Procedure:                Colonoscopy Indications:              Screening for colorectal malignant neoplasm, Last                            colonoscopy 10 years ago Medicines:                Monitored Anesthesia Care Procedure:                Pre-Anesthesia Assessment:                           - Prior to the procedure, a History and Physical                            was performed, and patient medications and                            allergies were reviewed. The patient's tolerance of                            previous anesthesia was also reviewed. The risks                            and benefits of the procedure and the sedation                            options and risks were discussed with the patient.                            All questions were answered, and informed consent                            was obtained. Prior Anticoagulants: The patient has                            taken no previous anticoagulant or antiplatelet                            agents. ASA Grade Assessment: III - A patient with                            severe systemic disease. After reviewing the risks                            and benefits, the patient was deemed in                            satisfactory condition to undergo the procedure.  After obtaining informed consent, the colonoscope                            was passed under direct vision. Throughout the                            procedure, the patient's blood pressure, pulse, and                            oxygen saturations were monitored continuously. The                            Model PCF-H190L [id number]) scope was introduced                            through the anus and advanced to  the the cecum,                            identified by appendiceal orifice and ileocecal                            valve. The colonoscopy was performed without                            difficulty. The patient tolerated the procedure                            well. The quality of the bowel preparation was fair                            particularly in the right colon (patient did not                            perform the 2nd half of her prep). Preparation                            quality affects polyp detection which was explained                            to the patient) The ileocecal valve, appendiceal                            orifice, and rectum were photographed. The bowel                            preparation used was SUPREP. Scope In: 8:43:37 AM Scope Out: 9:01:08 AM Scope Withdrawal Time: 0 hours 13 minutes 32 seconds  Total Procedure Duration: 0 hours 17 minutes 31 seconds  Findings:                 The digital rectal exam was normal.                           A 5 mm polyp was found in the  transverse colon. The                            polyp was sessile. The polyp was removed with a                            cold snare. Resection and retrieval were complete.                           A few small-mouthed diverticula were found in the                            sigmoid colon.                           The retroflexed view of the distal rectum and anal                            verge was normal and showed no anal or rectal                            abnormalities. Complications:            No immediate complications. Estimated Blood Loss:     Estimated blood loss was minimal. Impression:               - Preparation of the right colon was fair.                           - One 5 mm polyp in the transverse colon, removed                            with a cold snare. Resected and retrieved.                           - Diverticulosis in the sigmoid colon.                            - The distal rectum and anal verge are normal on                            retroflexion view. Recommendation:           - Patient has a contact number available for                            emergencies. The signs and symptoms of potential                            delayed complications were discussed with the                            patient. Return to normal activities tomorrow.                            Written  discharge instructions were provided to the                            patient.                           - Resume previous diet.                           - Continue present medications.                           - Await pathology results.                           - No recommendation at this time regarding repeat                            colonoscopy due to age. Jerene Bears, MD 03/20/2016 9:14:35 AM This report has been signed electronically.

## 2016-03-20 NOTE — Progress Notes (Signed)
Called to room to assist during endoscopic procedure.  Patient ID and intended procedure confirmed with present staff. Received instructions for my participation in the procedure from the performing physician.  

## 2016-03-21 ENCOUNTER — Telehealth: Payer: Self-pay

## 2016-03-21 ENCOUNTER — Telehealth: Payer: Self-pay | Admitting: *Deleted

## 2016-03-21 NOTE — Telephone Encounter (Signed)
  Follow up Call-  Call back number 03/20/2016  Post procedure Call Back phone  # [phone] or [phone]  Permission to leave phone message Yes  Some recent data might be hidden     Patient questions:  Do you have a fever, pain , or abdominal swelling? No. Pain Score  0 *  Have you tolerated food without any problems? Yes.    Have you been able to return to your normal activities? Yes.    Do you have any questions about your discharge instructions: Diet   No. Medications  No. Follow up visit  No.  Do you have questions or concerns about your Care? No.  Actions: * If pain score is 4 or above: No action needed, pain <4.

## 2016-03-21 NOTE — Telephone Encounter (Signed)
  Follow up Call-  Call back number 03/20/2016  Post procedure Call Back phone  # [phone] or [phone]  Permission to leave phone message Yes  Some recent data might be hidden     Patient questions:  No VM box set up at this time.

## 2016-03-25 ENCOUNTER — Encounter: Payer: Self-pay | Admitting: Internal Medicine

## 2016-04-08 ENCOUNTER — Encounter: Payer: Self-pay | Admitting: Physician Assistant

## 2016-04-08 ENCOUNTER — Ambulatory Visit (INDEPENDENT_AMBULATORY_CARE_PROVIDER_SITE_OTHER): Payer: Medicare Other | Admitting: Physician Assistant

## 2016-04-08 VITALS — BP 102/66 | HR 92 | Temp 97.8°F | Resp 16 | Ht 62.0 in | Wt 167.4 lb

## 2016-04-08 DIAGNOSIS — J019 Acute sinusitis, unspecified: Secondary | ICD-10-CM

## 2016-04-08 DIAGNOSIS — B9689 Other specified bacterial agents as the cause of diseases classified elsewhere: Secondary | ICD-10-CM

## 2016-04-08 MED ORDER — BENZONATATE 100 MG PO CAPS: 100.0000 mg | ORAL_CAPSULE | Freq: Three times a day (TID) | ORAL | 0 refills | Status: DC | PRN

## 2016-04-08 MED ORDER — LEVOFLOXACIN 500 MG PO TABS: 500.0000 mg | ORAL_TABLET | Freq: Every day | ORAL | 0 refills | Status: DC

## 2016-04-08 MED FILL — BENZONATATE 100 MG CAPSULE: 100 | 10 days supply | Qty: 30 | Fill #0

## 2016-04-08 MED FILL — levoFLOXacin 500 MG TABS: 500 | 7 days supply | Qty: 7 | Fill #0

## 2016-04-08 NOTE — Patient Instructions (Signed)
Please take antibiotic as directed.  Increase fluid intake.  Use Saline nasal spray.  Take a daily multivitamin. Tessalon for cough. Continue current allergy medications.  Place a humidifier in the bedroom.  Please call or return clinic if symptoms are not improving.  Sinusitis Sinusitis is redness, soreness, and swelling (inflammation) of the paranasal sinuses. Paranasal sinuses are air pockets within the bones of your face (beneath the eyes, the middle of the forehead, or above the eyes). In healthy paranasal sinuses, mucus is able to drain out, and air is able to circulate through them by way of your nose. However, when your paranasal sinuses are inflamed, mucus and air can become trapped. This can allow bacteria and other germs to grow and cause infection. Sinusitis can develop quickly and last only a short time (acute) or continue over a long period (chronic). Sinusitis that lasts for more than 12 weeks is considered chronic.  CAUSES  Causes of sinusitis include:  Allergies.  Structural abnormalities, such as displacement of the cartilage that separates your nostrils (deviated septum), which can decrease the air flow through your nose and sinuses and affect sinus drainage.  Functional abnormalities, such as when the small hairs (cilia) that line your sinuses and help remove mucus do not work properly or are not present. SYMPTOMS  Symptoms of acute and chronic sinusitis are the same. The primary symptoms are pain and pressure around the affected sinuses. Other symptoms include:  Upper toothache.  Earache.  Headache.  Bad breath.  Decreased sense of smell and taste.  A cough, which worsens when you are lying flat.  Fatigue.  Fever.  Thick drainage from your nose, which often is green and may contain pus (purulent).  Swelling and warmth over the affected sinuses. DIAGNOSIS  Your caregiver will perform a physical exam. During the exam, your caregiver may:  Look in your nose for  signs of abnormal growths in your nostrils (nasal polyps).  Tap over the affected sinus to check for signs of infection.  View the inside of your sinuses (endoscopy) with a special imaging device with a light attached (endoscope), which is inserted into your sinuses. If your caregiver suspects that you have chronic sinusitis, one or more of the following tests may be recommended:  Allergy tests.  Nasal culture A sample of mucus is taken from your nose and sent to a lab and screened for bacteria.  Nasal cytology A sample of mucus is taken from your nose and examined by your caregiver to determine if your sinusitis is related to an allergy. TREATMENT  Most cases of acute sinusitis are related to a viral infection and will resolve on their own within 10 days. Sometimes medicines are prescribed to help relieve symptoms (pain medicine, decongestants, nasal steroid sprays, or saline sprays).  However, for sinusitis related to a bacterial infection, your caregiver will prescribe antibiotic medicines. These are medicines that will help kill the bacteria causing the infection.  Rarely, sinusitis is caused by a fungal infection. In theses cases, your caregiver will prescribe antifungal medicine. For some cases of chronic sinusitis, surgery is needed. Generally, these are cases in which sinusitis recurs more than 3 times per year, despite other treatments. HOME CARE INSTRUCTIONS   Drink plenty of water. Water helps thin the mucus so your sinuses can drain more easily.  Use a humidifier.  Inhale steam 3 to 4 times a day (for example, sit in the bathroom with the shower running).  Apply a warm, moist washcloth to your  face 3 to 4 times a day, or as directed by your caregiver.  Use saline nasal sprays to help moisten and clean your sinuses.  Take over-the-counter or prescription medicines for pain, discomfort, or fever only as directed by your caregiver. SEEK IMMEDIATE MEDICAL CARE IF:  You have  increasing pain or severe headaches.  You have nausea, vomiting, or drowsiness.  You have swelling around your face.  You have vision problems.  You have a stiff neck.  You have difficulty breathing. MAKE SURE YOU:   Understand these instructions.  Will watch your condition.  Will get help right away if you are not doing well or get worse. Document Released: 06/16/2005 Document Revised: 09/08/2011 Document Reviewed: 07/01/2011 [facility] Patient Information 2014 [place], [place].

## 2016-04-08 NOTE — Progress Notes (Signed)
Pre visit review using our clinic review tool, if applicable. No additional management support is needed unless otherwise documented below in the visit note/SLS  

## 2016-04-08 NOTE — Progress Notes (Signed)
Patient presents to clinic today c/o 1 week of sinus pressure, sinus pain, ear pressure and pain. Endorses also PND with cough that has become productive of a thick, yellow sputum. Patient also endorses some redness of eyes but notes history of chronic dry eye. Has felt warm but denies measures fever.   Past Medical History:  Diagnosis Date  . Allergy   . Anemia    when had uterine fibroids  . Anxiety   . Cataract    very small  . Cholelithiasis   . DJD (degenerative joint disease)   . Dry eye syndrome   . Headache(784.0)   . Hyperlipidemia 01/20/2011  . Hypertension 09/29/2006  . Thrombocytopenia ([place]) 08/27/2011   hematology evaluation 08-2011, return prn  . Urticaria   . Vasomotor rhinitis     Current Outpatient Prescriptions on File Prior to Visit  Medication Sig Dispense Refill  . aspirin 81 MG tablet Take 81 mg by mouth daily with breakfast.     . Calcium-Vitamin D (CALCIUM + D PO) Take 2 tablets by mouth daily with lunch.     . carvedilol (COREG) 6.25 MG tablet Take 1 tablet (6.25 mg total) by mouth 2 (two) times daily with a meal. 180 tablet 1  . cetirizine (ZYRTEC) 10 MG tablet Take 1 tablet by mouth daily as needed.    . cycloSPORINE (RESTASIS) 0.05 % ophthalmic emulsion Place 1 drop into both eyes every 12 (twelve) hours.     Marland Kitchen dextromethorphan (DELSYM) 30 MG/5ML liquid Take 15 mg by mouth as needed for cough.    . diphenhydrAMINE (BENADRYL) 25 MG tablet Take 25 mg by mouth every 6 (six) hours as needed for allergies. Reported on 08/06/2015    . EPINEPHrine (EPIPEN 2-PAK) 0.3 mg/0.3 mL IJ SOAJ injection Inject 0.3 mLs (0.3 mg total) into the muscle once. 1 Device 1  . famotidine (PEPCID) 20 MG tablet Take 1 tablet (20 mg total) by mouth 2 (two) times daily. (Patient taking differently: Take 20 mg by mouth daily. ) 20 tablet 0  . fluticasone (FLONASE) 50 MCG/ACT nasal spray Place 1-2 sprays into both nostrils daily as needed for allergies or rhinitis.    Marland Kitchen  glucosamine-chondroitin 500-400 MG tablet Take 1 tablet by mouth 3 (three) times daily.      Marland Kitchen ipratropium (ATROVENT) 0.06 % nasal spray Place 2 sprays into both nostrils every 4 (four) hours as needed for rhinitis. 15 mL 1  . montelukast (SINGULAIR) 10 MG tablet Take 1 tablet (10 mg total) by mouth at bedtime.    . Multiple Vitamin (MULTIVITAMIN WITH MINERALS) TABS Take 1 tablet by mouth daily with breakfast.    . Polyethyl Glycol-Propyl Glycol (SYSTANE ULTRA) 0.4-0.3 % SOLN Place 1 drop into both eyes 3 (three) times daily as needed (dry eyes).     Marland Kitchen spironolactone (ALDACTONE) 25 MG tablet Take 1 tablet (25 mg total) by mouth daily. 90 tablet 1   Current Facility-Administered Medications on File Prior to Visit  Medication Dose Route Frequency Provider Last Rate Last Dose  . 0.9 %  sodium chloride infusion  500 mL Intravenous Continuous Jerene Bears, MD        Allergies  Allergen Reactions  . Bee Venom Anaphylaxis  . Doxycycline Hives and Itching  . Penicillins Itching, Nausea And Vomiting and Swelling    syncope  . Sertraline Other (See Comments)    Dry Mouth  . Azithromycin Itching and Rash  . Cerumenex [Trolamine] Itching and Rash  .  Climara [Estradiol] Itching and Rash  . Nickel Rash    Family History  Problem Relation Age of Onset  . Coronary artery disease Father   . Cancer Father     unsure of type  . Other Father     Guillain- barre syndrome  . Diabetes Other     uncles   . Hypertension Mother   . Anxiety disorder Mother   . Depression Mother   . Prostate cancer Other     grandfather  . Breast cancer Sister 59  . Depression Maternal Uncle   . Colon cancer Neg Hx   . Colon polyps Neg Hx   . Rectal cancer Neg Hx   . Stomach cancer Neg Hx     Social History   Social History  . Marital status: Widowed    Spouse name: N/A  . Number of children: 2  . Years of education: N/A   Occupational History  . 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 Smoking status: Never Smoker  . Smokeless tobacco: Never Used  . Alcohol use No  . Drug use: No  . Sexual activity: Not Asked   Other Topics Concern  . None   Social History Narrative   husband had prostate ca , passed away 09/11/13   takes care of mom, 42 y/o who is blind , and two other fam members            Review of Systems - See HPI.  All other ROS are negative.  BP 102/66 (BP Location: Right Arm, Patient Position: Sitting, Cuff Size: Large)   Pulse 92   Temp 97.8 F (36.6 C) (Oral)   Resp 16   Ht 5\' 2"  (1.575 m)   Wt 167 lb 6 oz (75.9 kg)   SpO2 97%   BMI 30.61 kg/m   Physical Exam  Constitutional: She is oriented to person, place, and time and well-developed, well-nourished, and in no distress.  HENT:  Head: Normocephalic and atraumatic.  Right Ear: Tympanic membrane normal.  Left Ear: Tympanic membrane normal.  Nose: Mucosal edema and rhinorrhea present. Right sinus exhibits maxillary sinus tenderness.  Mouth/Throat: Uvula is midline, oropharynx is clear and moist and mucous membranes are normal. No oropharyngeal exudate.  Eyes: EOM are normal. Pupils are equal, round, and reactive to light. Right eye exhibits no discharge and no exudate. Left eye exhibits no discharge and no exudate. Right conjunctiva is injected. Left conjunctiva is injected.  Neck: Neck supple.  Cardiovascular: Normal rate, regular rhythm, normal heart sounds and intact distal pulses.   Pulmonary/Chest: Effort normal and breath sounds normal. No respiratory distress. She has no wheezes. She has no rales. She exhibits no tenderness.  Neurological: She is alert and oriented to person, place, and time.  Skin: Skin is warm and dry. No rash noted.  Psychiatric: Affect normal.  Vitals reviewed.  Recent Results (from the past 2160 hour(s))  Hemoglobin A1c     Status: None   Collection Time: 02/04/16  9:31 AM  Result Value Ref Range   Hgb A1c MFr Bld 6.0 4.6 - 6.5 %    Comment: Glycemic  Control Guidelines for People with Diabetes:Non Diabetic:  <6%Goal of Therapy: <7%Additional Action Suggested:  >8%   AST     Status: None   Collection Time: 02/04/16  9:31 AM  Result Value Ref Range   AST 21 0 - 37 U/L  ALT  Status: None   Collection Time: 02/04/16  9:31 AM  Result Value Ref Range   ALT 18 0 - 35 U/L  Basic metabolic panel     Status: Abnormal   Collection Time: 02/04/16  9:31 AM  Result Value Ref Range   Sodium 140 135 - 145 mEq/L   Potassium 3.8 3.5 - 5.1 mEq/L   Chloride 103 96 - 112 mEq/L   CO2 25 19 - 32 mEq/L   Glucose, Bld 123 (H) 70 - 99 mg/dL   BUN 14 6 - 23 mg/dL   Creatinine, Ser 0.91 0.40 - 1.20 mg/dL   Calcium 9.4 8.4 - 10.5 mg/dL   GFR 77.38 >60.00 mL/min  HM DIABETES EYE EXAM     Status: None   Collection Time: 02/14/16 12:00 AM  Result Value Ref Range   HM Diabetic Eye Exam No Retinopathy No Retinopathy    Assessment/Plan: 1. Acute bacterial sinusitis Patient with multi-drug allergies. Has tolerated FQ very well previously. Will Rx Levaquin 500 mg daily x 7 days. Tessalon for cough. Continue chronic allergy medications. She is to restart her eye drops for chronic dry eye. Humidifier in bedroom. Return precautions discussed with patient.   - levofloxacin (LEVAQUIN) 500 MG tablet; Take 1 tablet (500 mg total) by mouth daily.  Dispense: 7 tablet; Refill: 0 - benzonatate (TESSALON) 100 MG capsule; Take 1 capsule (100 mg total) by mouth 3 (three) times daily as needed.  Dispense: 30 capsule; Refill: 0   Leeanne Rio, [place]

## 2016-05-23 ENCOUNTER — Other Ambulatory Visit: Payer: Self-pay | Admitting: Internal Medicine

## 2016-05-26 MED FILL — CARVEDILOL 6.25 MG TABLET: 6.25 | 90 days supply | Qty: 180 | Fill #0

## 2016-05-26 MED FILL — SPIRONOLACTONE 25 MG TABLET: 25 | 90 days supply | Qty: 90 | Fill #0

## 2016-06-04 ENCOUNTER — Emergency Department (HOSPITAL_BASED_OUTPATIENT_CLINIC_OR_DEPARTMENT_OTHER): Payer: Medicare Other

## 2016-06-04 ENCOUNTER — Inpatient Hospital Stay (HOSPITAL_BASED_OUTPATIENT_CLINIC_OR_DEPARTMENT_OTHER)
Admission: EM | Admit: 2016-06-04 | Discharge: 2016-06-09 | DRG: 419 | Disposition: A | Discharge status: Home / Self Care | Payer: Medicare Other | Attending: Internal Medicine | Admitting: Internal Medicine

## 2016-06-04 ENCOUNTER — Encounter (HOSPITAL_BASED_OUTPATIENT_CLINIC_OR_DEPARTMENT_OTHER): Payer: Self-pay | Admitting: Emergency Medicine

## 2016-06-04 ENCOUNTER — Ambulatory Visit (INDEPENDENT_AMBULATORY_CARE_PROVIDER_SITE_OTHER): Payer: Medicare Other | Admitting: Medical

## 2016-06-04 VITALS — BP 120/72 | HR 84 | Temp 97.4°F | Resp 16 | Ht 62.0 in | Wt 168.1 lb

## 2016-06-04 DIAGNOSIS — K8065 Calculus of gallbladder and bile duct with chronic cholecystitis with obstruction: Principal | ICD-10-CM | POA: Diagnosis present

## 2016-06-04 DIAGNOSIS — Z7982 Long term (current) use of aspirin: Secondary | ICD-10-CM

## 2016-06-04 DIAGNOSIS — I10 Essential (primary) hypertension: Secondary | ICD-10-CM | POA: Diagnosis present

## 2016-06-04 DIAGNOSIS — R7989 Other specified abnormal findings of blood chemistry: Secondary | ICD-10-CM

## 2016-06-04 DIAGNOSIS — R079 Chest pain, unspecified: Secondary | ICD-10-CM

## 2016-06-04 DIAGNOSIS — M546 Pain in thoracic spine: Secondary | ICD-10-CM

## 2016-06-04 DIAGNOSIS — E785 Hyperlipidemia, unspecified: Secondary | ICD-10-CM | POA: Diagnosis not present

## 2016-06-04 DIAGNOSIS — D649 Anemia, unspecified: Secondary | ICD-10-CM | POA: Diagnosis present

## 2016-06-04 DIAGNOSIS — J309 Allergic rhinitis, unspecified: Secondary | ICD-10-CM | POA: Diagnosis not present

## 2016-06-04 DIAGNOSIS — R0789 Other chest pain: Secondary | ICD-10-CM | POA: Diagnosis not present

## 2016-06-04 DIAGNOSIS — K8020 Calculus of gallbladder without cholecystitis without obstruction: Secondary | ICD-10-CM

## 2016-06-04 DIAGNOSIS — K219 Gastro-esophageal reflux disease without esophagitis: Secondary | ICD-10-CM | POA: Diagnosis present

## 2016-06-04 DIAGNOSIS — K759 Inflammatory liver disease, unspecified: Secondary | ICD-10-CM | POA: Diagnosis not present

## 2016-06-04 DIAGNOSIS — R933 Abnormal findings on diagnostic imaging of other parts of digestive tract: Secondary | ICD-10-CM

## 2016-06-04 DIAGNOSIS — K8050 Calculus of bile duct without cholangitis or cholecystitis without obstruction: Secondary | ICD-10-CM

## 2016-06-04 DIAGNOSIS — F419 Anxiety disorder, unspecified: Secondary | ICD-10-CM | POA: Diagnosis present

## 2016-06-04 DIAGNOSIS — R109 Unspecified abdominal pain: Secondary | ICD-10-CM | POA: Diagnosis not present

## 2016-06-04 DIAGNOSIS — Z79899 Other long term (current) drug therapy: Secondary | ICD-10-CM

## 2016-06-04 DIAGNOSIS — Z419 Encounter for procedure for purposes other than remedying health state, unspecified: Secondary | ICD-10-CM

## 2016-06-04 DIAGNOSIS — R05 Cough: Secondary | ICD-10-CM | POA: Diagnosis not present

## 2016-06-04 DIAGNOSIS — R748 Abnormal levels of other serum enzymes: Secondary | ICD-10-CM

## 2016-06-04 DIAGNOSIS — R945 Abnormal results of liver function studies: Secondary | ICD-10-CM

## 2016-06-04 LAB — COMPREHENSIVE METABOLIC PANEL
ALT: 138 U/L — ABNORMAL HIGH (ref 14–54)
AST: 297 U/L — ABNORMAL HIGH (ref 15–41)
Albumin: 4 g/dL (ref 3.5–5.0)
Alkaline Phosphatase: 66 U/L (ref 38–126)
Anion gap: 6 (ref 5–15)
BUN: 12 mg/dL (ref 6–20)
CO2: 30 mmol/L (ref 22–32)
Calcium: 9.8 mg/dL (ref 8.9–10.3)
Chloride: 105 mmol/L (ref 101–111)
Creatinine, Ser: 0.69 mg/dL (ref 0.44–1.00)
GFR calc Af Amer: >60 mL/min (ref >60)
GFR calc non Af Amer: >60 mL/min (ref >60)
Glucose, Bld: 107 mg/dL — ABNORMAL HIGH (ref 65–99)
Potassium: 4 mmol/L (ref 3.5–5.1)
Sodium: 141 mmol/L (ref 135–145)
Total Bilirubin: 1.6 mg/dL — ABNORMAL HIGH (ref 0.3–1.2)
Total Protein: 6.8 g/dL (ref 6.5–8.1)

## 2016-06-04 LAB — CBC WITH DIFFERENTIAL/PLATELET
Basophils Absolute: 0 10*3/uL (ref 0.0–0.1)
Basophils Relative: 0 %
Eosinophils Absolute: 0.3 10*3/uL (ref 0.0–0.7)
Eosinophils Relative: 3 %
HCT: 39.7 % (ref 36.0–46.0)
Hemoglobin: 12.7 g/dL (ref 12.0–15.0)
Lymphocytes Relative: 27 %
Lymphs Abs: 2 10*3/uL (ref 0.7–4.0)
MCH: 22.8 pg — ABNORMAL LOW (ref 26.0–34.0)
MCHC: 32 g/dL (ref 30.0–36.0)
MCV: 71.4 fL — ABNORMAL LOW (ref 78.0–100.0)
Monocytes Absolute: 0.6 10*3/uL (ref 0.1–1.0)
Monocytes Relative: 8 %
Neutro Abs: 4.5 10*3/uL (ref 1.7–7.7)
Neutrophils Relative %: 62 %
Platelets: 159 10*3/uL (ref 150–400)
RBC: 5.56 MIL/uL — ABNORMAL HIGH (ref 3.87–5.11)
RDW: 15.3 % (ref 11.5–15.5)
WBC: 7.3 10*3/uL (ref 4.0–10.5)

## 2016-06-04 LAB — URINALYSIS, ROUTINE W REFLEX MICROSCOPIC
Bilirubin Urine: NEGATIVE
Glucose, UA: NEGATIVE mg/dL
Hgb urine dipstick: NEGATIVE
Ketones, ur: NEGATIVE mg/dL
Leukocytes, UA: NEGATIVE
Nitrite: NEGATIVE
Protein, ur: NEGATIVE mg/dL
Specific Gravity, Urine: 1.015 (ref 1.005–1.030)
pH: 6 (ref 5.0–8.0)

## 2016-06-04 LAB — LIPASE, BLOOD: Lipase: 46 U/L (ref 11–51)

## 2016-06-04 LAB — TROPONIN I: Troponin I: <0.03 ng/mL (ref <0.03)

## 2016-06-04 MED ORDER — IOPAMIDOL (ISOVUE-370) INJECTION 76%
100.0000 mL | Freq: Once | INTRAVENOUS | Status: AC | PRN
  Administered 2016-06-04: 100 mL via INTRAVENOUS

## 2016-06-04 NOTE — ED Notes (Signed)
ED Provider at bedside. 

## 2016-06-04 NOTE — ED Provider Notes (Signed)
[facility] Provider Note   CSN: [id number] Arrival date & time: 06/04/16  1654 By signing my name below, I, Doran Stabler, attest that this documentation has been prepared under the direction and in the presence of Harlene Ramus, [place]. Electronically Signed: Doran Stabler, ED Scribe. 06/05/16. 12:02 AM.  History   Chief Complaint Chief Complaint  Patient presents with  . Chest Pain   The history is provided by the patient. No language interpreter was used.   HPI Comments: Monique [name] is a 75 y.o. female who presents to the Emergency Department with a PMHx of HTN, HLD, DJD, anemia complaining of waxing and waning intermittent achy central/lower chest pain, abdominal pain and upper back pain for that past 1 week. She reports having a worsening episode of CP this morning that occurred around 9am. She was seen by her PCP who refereed her to the ED for further evaluation. She describes her pain as a mild pressure and aching discomfot. Pt reports upper back pain that radiates around her sides around her to mid lower chest. Endorses associated nausea. Her reports her chest and back are currently not bothering her. She states she had this type of abdominal pain when she had diverticulitis. She rates her abdominal pain a 5/10 in severity. Pt denies any aggravating or alleviating factors. Pt took Aspirin today. Pt denies any fevers, chills, cough, SOB, N/V/D, visual disturbances, lightheadedness, groin numbness, weakness, bladder or bowel incontinence or any other symptoms at this time. Pt denies history of heart disease. Denies smoking. Pt reports family history of heart disease after the age of 66.  Past Medical History:  Diagnosis Date  . Allergy   . Anemia    when had uterine fibroids  . Anxiety   . Cataract    very small  . Cholelithiasis   . DJD (degenerative joint disease)   . Dry eye syndrome   . Headache(784.0)   . Hyperlipidemia 01/20/2011  . Hypertension 09/29/2006    . Thrombocytopenia ([facility]) 08/27/2011   hematology evaluation 08-2011, return prn  . Urticaria   . Vasomotor rhinitis     Patient Active Problem List   Diagnosis Date Noted  . Cholelithiases 06/04/2016  . PCP NOTES >>>>>>>>>>>>>>>>>>>>>>>>> 02/04/2016  . Severe allergic reaction 08/17/2014  . Allergic rhinitis 12/01/2011  . Thrombocytopenia ([facility]) 08/27/2011  . Hyperlipidemia 01/20/2011  . Annual physical exam 09/18/2010  . Pre-diabetes 09/17/2009  . Backache 07/10/2008  . ANXIETY 01/19/2008  . HX OF GALLSTONE 10/20/2006  . Essential hypertension 09/29/2006    Past Surgical History:  Procedure Laterality Date  . ABDOMINAL HYSTERECTOMY     no oophorectomy  . APPENDECTOMY    . COLONOSCOPY    . DILATION AND CURETTAGE OF UTERUS    . mole removals    . TONSILLECTOMY    . UMBILICAL HERNIA REPAIR      OB History    No data available       Home Medications    Prior to Admission medications   Medication Sig Start Date End Date Taking? Authorizing Provider  aspirin 81 MG tablet Take 81 mg by mouth daily with breakfast.     Historical Provider, MD  Calcium-Vitamin D (CALCIUM + D PO) Take 2 tablets by mouth daily with lunch.     Historical Provider, MD  carvedilol (COREG) 6.25 MG tablet Take 1 tablet (6.25 mg total) by mouth 2 (two) times daily with a meal. 05/26/16   Colon Branch, MD  cetirizine (ZYRTEC) 10  MG tablet Take 1 tablet by mouth daily as needed. 11/28/14   Historical Provider, MD  cycloSPORINE (RESTASIS) 0.05 % ophthalmic emulsion Place 1 drop into both eyes every 12 (twelve) hours.     Historical Provider, MD  dextromethorphan (DELSYM) 30 MG/5ML liquid Take 15 mg by mouth as needed for cough.    Historical Provider, MD  diphenhydrAMINE (BENADRYL) 25 MG tablet Take 25 mg by mouth every 6 (six) hours as needed for allergies. Reported on 08/06/2015    Historical Provider, MD  EPINEPHrine (EPIPEN 2-PAK) 0.3 mg/0.3 mL IJ SOAJ injection Inject 0.3 mLs (0.3 mg total) into the  muscle once. 11/27/15   Colon Branch, MD  famotidine (PEPCID) 20 MG tablet Take 1 tablet (20 mg total) by mouth 2 (two) times daily. Patient taking differently: Take 20 mg by mouth daily.  06/19/14   Rolland Porter, MD  fluticasone (FLONASE) 50 MCG/ACT nasal spray Place 1-2 sprays into both nostrils daily as needed for allergies or rhinitis.    Historical Provider, MD  glucosamine-chondroitin 500-400 MG tablet Take 1 tablet by mouth 3 (three) times daily.      Historical Provider, MD  montelukast (SINGULAIR) 10 MG tablet Take 1 tablet (10 mg total) by mouth at bedtime. 03/19/15   Mosetta Anis, MD  Multiple Vitamin (MULTIVITAMIN WITH MINERALS) TABS Take 1 tablet by mouth daily with breakfast.    Historical Provider, MD  Polyethyl Glycol-Propyl Glycol (SYSTANE ULTRA) 0.4-0.3 % SOLN Place 1 drop into both eyes 3 (three) times daily as needed (dry eyes).     Historical Provider, MD  spironolactone (ALDACTONE) 25 MG tablet Take 1 tablet (25 mg total) by mouth daily. 05/26/16   Colon Branch, MD    Family History Family History  Problem Relation Age of Onset  . Coronary artery disease Father   . Cancer Father     unsure of type  . Other Father     Guillain- barre syndrome  . Diabetes Other     uncles   . Hypertension Mother   . Anxiety disorder Mother   . Depression Mother   . Prostate cancer Other     grandfather  . Breast cancer Sister 5  . Depression Maternal Uncle   . Colon cancer Neg Hx   . Colon polyps Neg Hx   . Rectal cancer Neg Hx   . Stomach cancer Neg Hx     Social History Social History  Substance Use Topics  . Smoking status: Never Smoker  . Smokeless tobacco: Never Used  . Alcohol use No   Allergies   Bee venom; Doxycycline; Penicillins; Sertraline; Azithromycin; Cerumenex [trolamine]; Climara [estradiol]; and Nickel   Review of Systems Review of Systems  Constitutional: Negative for chills and fever.  Eyes: Negative for visual disturbance.  Respiratory: Negative for  shortness of breath.   Cardiovascular: Positive for chest pain. Negative for palpitations.  Gastrointestinal: Positive for abdominal pain and nausea. Negative for diarrhea and vomiting.  Genitourinary: Negative for dysuria.  Musculoskeletal: Positive for back pain.  Neurological: Negative for weakness, light-headedness and numbness.  All other systems reviewed and are negative.  Physical Exam Updated Vital Signs BP 130/78 (BP Location: Left Arm)   Pulse 78   Temp 98.1 F (36.7 C) (Oral)   Resp 14   Ht 5\' 2"  (1.575 m)   Wt 76.2 kg   SpO2 100%   BMI 30.73 kg/m   Physical Exam  Constitutional: She is oriented to person, place, and time. She  appears well-developed and well-nourished.  HENT:  Head: Normocephalic and atraumatic.  Mouth/Throat: Uvula is midline, oropharynx is clear and moist and mucous membranes are normal. No oropharyngeal exudate, posterior oropharyngeal edema, posterior oropharyngeal erythema or tonsillar abscesses. No tonsillar exudate.  Eyes: Conjunctivae and EOM are normal. Pupils are equal, round, and reactive to light. Right eye exhibits no discharge. Left eye exhibits no discharge. No scleral icterus.  Neck: Normal range of motion. Neck supple.  Cardiovascular: Normal rate, regular rhythm, normal heart sounds and intact distal pulses.   Pulmonary/Chest: Effort normal and breath sounds normal. No respiratory distress. She has no wheezes. She has no rales. She exhibits no tenderness.  Abdominal: Soft. Bowel sounds are normal. She exhibits no distension and no mass. There is tenderness (mild, diffuse). There is no rebound and no guarding. No hernia.  No CVA tenderness.  Musculoskeletal: Normal range of motion. She exhibits no edema or tenderness.  No midline C, T, or L tenderness. Full range of motion of neck and back. Full range of motion of bilateral upper and lower extremities, with 5/5 strength. Sensation intact. 2+ radial and PT pulses. Cap refill <2 seconds.  Patient able to stand and ambulate without assistance.   Neurological: She is alert and oriented to person, place, and time.  Skin: Skin is warm and dry.  Nursing note and vitals reviewed.  ED Treatments / Results  DIAGNOSTIC STUDIES: Oxygen Saturation is 99% on room air, normal by my interpretation.    COORDINATION OF CARE: 5:17 PM Discussed treatment plan with pt at bedside and pt agreed to plan.  Labs (all labs ordered are listed, but only abnormal results are displayed) Labs Reviewed  CBC WITH DIFFERENTIAL/PLATELET - Abnormal; Notable for the following:       Result Value   RBC 5.56 (*)    MCV 71.4 (*)    MCH 22.8 (*)    All other components within normal limits  COMPREHENSIVE METABOLIC PANEL - Abnormal; Notable for the following:    Glucose, Bld 107 (*)    AST 297 (*)    ALT 138 (*)    Total Bilirubin 1.6 (*)    All other components within normal limits  LIPASE, BLOOD  URINALYSIS, ROUTINE W REFLEX MICROSCOPIC  TROPONIN I    EKG  EKG Interpretation None       Radiology Dg Chest 2 View  Result Date: 06/04/2016 CLINICAL DATA:  Cough with chest pain. EXAM: CHEST  2 VIEW COMPARISON:  08/17/2014 FINDINGS: The cardio pericardial silhouette is enlarged. Prominence of the thoracic aorta raises the question of thoracic aortic aneurysm. The lungs are clear bilaterally. The visualized bony structures of the thorax are intact. IMPRESSION: 1. No acute cardiopulmonary findings. 2. Cardiomegaly with thoracic aortic prominence, unchanged in the interval, but raising concern for aneurysm. CT chest could be used to further evaluate as clinically warranted. Electronically Signed   By: Misty Stanley M.D.   On: 06/04/2016 18:46   US Abdomen Complete  Result Date: 06/04/2016 CLINICAL DATA:  Midline abdominal pain radiating to central chest x1 week intermittently. Nausea intermittently. History of umbilical surgery years ago. EXAM: ABDOMEN ULTRASOUND COMPLETE COMPARISON:  CT report from  12/27/2000 FINDINGS: Gallbladder: The gallbladder is contracted. There is a gallstone in the body of the gallbladder measuring 2.6 x 1.3 x 2.1 cm. No sonographic Murphy's sign was elicited. Gallbladder wall thickness is top normal at 3 mm. Common bile duct: Diameter: Normal at 3.6 mm Liver: Slightly coarsened and increased in echogenicity without  space-occupying mass or biliary dilatation. IVC: No abnormality visualized. Pancreas: There is an ovoid 1.3 x 0.4 cm area of hypoechogenicity along the ventral pancreatic head and body. Spleen: Size and appearance within normal limits. Right Kidney: Length: 9 cm. Echogenicity within normal limits. No mass or hydronephrosis visualized. Left Kidney: Length: 9.9 cm. Echogenicity within normal limits. No mass or hydronephrosis visualized. Abdominal aorta: No aneurysm visualized. Other findings: None. IMPRESSION: Uncomplicated cholelithiasis with a gallstone measuring 2.6 x 1.3 x 2.1 cm noted. No secondary signs of acute cholecystitis. 1.3 x 0.4 cm of hypoechogenicity along the ventral pancreatic head and body, nonspecific in etiology. CT without and with IV contrast may help for further correlation. No ductal dilatation is seen. i Electronically Signed   By: Ashley Royalty M.D.   On: 06/04/2016 21:07   Ct Angio Chest Aorta W/cm &/or Wo/cm  Result Date: 06/04/2016 CLINICAL DATA:  Chest, abdominal and back pain. EXAM: CT ANGIOGRAPHY CHEST, ABDOMEN AND PELVIS TECHNIQUE: Multidetector CT imaging through the chest, abdomen and pelvis was performed using the standard protocol during bolus administration of intravenous contrast. Multiplanar reconstructed images and MIPs were obtained and reviewed to evaluate the vascular anatomy. CONTRAST:  100 cc Isovue 370 IV COMPARISON:  Chest radiograph earlier this day. FINDINGS: CTA CHEST FINDINGS Cardiovascular: Thoracic aortic tortuosity without dissection or aneurysm. Maximal ascending aortic dimension 3.6 cm. Left vertebral artery arises  directly from the aortic arch, a normal variant. No aortic hematoma. There is scattered mild atherosclerosis. Mild multi chamber cardiomegaly. Mediastinum/Nodes: No mediastinal, hilar, or axillary adenopathy. Left axillary lymph nodes with normal fatty hila. No pericardial effusion. The esophagus is patulous. No focal abnormality in the visualized thyroid gland. Lungs/Pleura: Mild breathing motion artifact. Tiny calcified granuloma in the right lower lobe. Scattered linear atelectasis. No consolidation. No evidence pulmonary edema. No pleural fluid. Musculoskeletal: Multilevel degenerative change throughout the spine. There are no acute or suspicious osseous abnormalities. Review of the MIP images confirms the above findings. CTA ABDOMEN AND PELVIS FINDINGS VASCULAR Aorta: Normal caliber aorta without aneurysm, dissection, vasculitis or significant stenosis. Celiac: Patent without evidence of aneurysm, dissection, vasculitis or significant stenosis. SMA: Patent without evidence of aneurysm, dissection, vasculitis or significant stenosis. Renals: Both renal arteries are patent without evidence of aneurysm, dissection, vasculitis, fibromuscular dysplasia or significant stenosis. IMA: Patent without evidence of aneurysm, dissection, vasculitis or significant stenosis. Inflow: Patent without evidence of aneurysm, dissection, vasculitis or significant stenosis. Veins: No obvious venous abnormality within the limitations of this arterial phase study. Review of the MIP images confirms the above findings. NON-VASCULAR Hepatobiliary: Gallstone on ultrasound earlier this day is not visualized by CT. No abnormal gallbladder distention. Normal arterial phase appearance of the liver. Pancreas: No definite CT correlate with the hypo echogenicity adjacent of the ventral pancreatic head on ultrasound. No ductal dilatation or inflammation. Spleen: Normal arterial phase appearance. Adrenals/Urinary Tract: No adrenal nodule. No  hydronephrosis or perinephric edema. Symmetric enhancement. Ureters are decompressed. Urinary bladder is mildly distended, no wall thickening. Stomach/Bowel: Significant colonic redundancy. A few diverticula at the hepatic flexure. No colonic inflammation. No small bowel dilatation or wall thickening. Appendix is not visualized, surgically absent. Stomach is distended with ingested contents. Lymphatic: No abdominal or pelvic adenopathy. Reproductive: Status post hysterectomy. No adnexal masses. Other: No free air free fluid.  Fat containing umbilical hernia. Musculoskeletal: Degenerative change throughout the lumbar spine with predominant lower facet arthropathy. Anterolisthesis of L4 on L5 appears degenerative. There are no acute or suspicious osseous abnormalities. Review of the  MIP images confirms the above findings. IMPRESSION: 1. No acute aortic abnormality. Tortuous thoracic aorta without dissection or aneurysm. 2. No acute abnormality in the chest, abdomen, or pelvis. 3. Patulous esophagus, can be seen with reflux. 4. No definite peripancreatic abnormality corresponding to the hypoechoic structure on ultrasound. Electronically Signed   By: Jeb Levering M.D.   On: 06/04/2016 23:14   Ct Angio Abd/pel W/ And/or W/o  Result Date: 06/04/2016 CLINICAL DATA:  Chest, abdominal and back pain. EXAM: CT ANGIOGRAPHY CHEST, ABDOMEN AND PELVIS TECHNIQUE: Multidetector CT imaging through the chest, abdomen and pelvis was performed using the standard protocol during bolus administration of intravenous contrast. Multiplanar reconstructed images and MIPs were obtained and reviewed to evaluate the vascular anatomy. CONTRAST:  100 cc Isovue 370 IV COMPARISON:  Chest radiograph earlier this day. FINDINGS: CTA CHEST FINDINGS Cardiovascular: Thoracic aortic tortuosity without dissection or aneurysm. Maximal ascending aortic dimension 3.6 cm. Left vertebral artery arises directly from the aortic arch, a normal variant. No  aortic hematoma. There is scattered mild atherosclerosis. Mild multi chamber cardiomegaly. Mediastinum/Nodes: No mediastinal, hilar, or axillary adenopathy. Left axillary lymph nodes with normal fatty hila. No pericardial effusion. The esophagus is patulous. No focal abnormality in the visualized thyroid gland. Lungs/Pleura: Mild breathing motion artifact. Tiny calcified granuloma in the right lower lobe. Scattered linear atelectasis. No consolidation. No evidence pulmonary edema. No pleural fluid. Musculoskeletal: Multilevel degenerative change throughout the spine. There are no acute or suspicious osseous abnormalities. Review of the MIP images confirms the above findings. CTA ABDOMEN AND PELVIS FINDINGS VASCULAR Aorta: Normal caliber aorta without aneurysm, dissection, vasculitis or significant stenosis. Celiac: Patent without evidence of aneurysm, dissection, vasculitis or significant stenosis. SMA: Patent without evidence of aneurysm, dissection, vasculitis or significant stenosis. Renals: Both renal arteries are patent without evidence of aneurysm, dissection, vasculitis, fibromuscular dysplasia or significant stenosis. IMA: Patent without evidence of aneurysm, dissection, vasculitis or significant stenosis. Inflow: Patent without evidence of aneurysm, dissection, vasculitis or significant stenosis. Veins: No obvious venous abnormality within the limitations of this arterial phase study. Review of the MIP images confirms the above findings. NON-VASCULAR Hepatobiliary: Gallstone on ultrasound earlier this day is not visualized by CT. No abnormal gallbladder distention. Normal arterial phase appearance of the liver. Pancreas: No definite CT correlate with the hypo echogenicity adjacent of the ventral pancreatic head on ultrasound. No ductal dilatation or inflammation. Spleen: Normal arterial phase appearance. Adrenals/Urinary Tract: No adrenal nodule. No hydronephrosis or perinephric edema. Symmetric  enhancement. Ureters are decompressed. Urinary bladder is mildly distended, no wall thickening. Stomach/Bowel: Significant colonic redundancy. A few diverticula at the hepatic flexure. No colonic inflammation. No small bowel dilatation or wall thickening. Appendix is not visualized, surgically absent. Stomach is distended with ingested contents. Lymphatic: No abdominal or pelvic adenopathy. Reproductive: Status post hysterectomy. No adnexal masses. Other: No free air free fluid.  Fat containing umbilical hernia. Musculoskeletal: Degenerative change throughout the lumbar spine with predominant lower facet arthropathy. Anterolisthesis of L4 on L5 appears degenerative. There are no acute or suspicious osseous abnormalities. Review of the MIP images confirms the above findings. IMPRESSION: 1. No acute aortic abnormality. Tortuous thoracic aorta without dissection or aneurysm. 2. No acute abnormality in the chest, abdomen, or pelvis. 3. Patulous esophagus, can be seen with reflux. 4. No definite peripancreatic abnormality corresponding to the hypoechoic structure on ultrasound. Electronically Signed   By: Jeb Levering M.D.   On: 06/04/2016 23:14    Procedures Procedures (including critical care time)  Medications Ordered in  ED Medications  iopamidol (ISOVUE-370) 76 % injection 100 mL (100 mLs Intravenous Contrast Given 06/04/16 2234)     Initial Impression / Assessment and Plan / ED Course  I have reviewed the triage vital signs and the nursing notes.  Pertinent labs & imaging results that were available during my care of the patient were reviewed by me and considered in my medical decision making (see chart for details).  Clinical Course     Patient presents with intermittent mid lower chest pain, mid back pain and abdominal pain for the past week. She denies currently having any chest pain at this time and notes her abdomen is really the only thing that is bothering her. Denies fever, vomiting,  diarrhea. VSS. Exam revealed mild diffuse abdominal tenderness, no peritoneal signs. Lungs clear to auscultation bilaterally. RRR. Remaining exam unremarkable. No neuro deficits. Patient endorses taking her daily aspirin at home this morning. Patient declined pain medications.  EKG showed sinus rhythm with no acute ischemic changes. Troponin negative. Chest x-ray showed no acute findings, cardiomegaly with thoracic aortic prominence noted which appears unchanged, recommend CT chest for further evaluation. Labs revealed an AST 297, ALT 138, total bilirubin 1.6. Lipase normal. Right upper quadrant ultrasound revealed uncomplicated cholelithiasis with no secondary signs of acute cholecystitis. Hypoechogenicity along ventral pancreatic head and body noted, nonspecific in etiology, recommend CT for further evaluation. No ductal dilation noted. CT angio chest, abdomen, pelvis showed no acute aortic abnormality, tortuous thoracic aorta without dissection or aneurysm; no acute abnormality and chest, abdomen, pelvis; no definitive peripancreatic abnormality corresponding to hypoechoic structure on ultrasound. I have a low suspicion for ACS, PE, dissection, or other acute cardiac event at this time. Patient's symptoms appear to be consistent with cholelithiasis. Discussed case with Dr. Lita Mains who evaluated the pt in the ED. Consulted general surgery. Dr. Johney Maine advised to transfer pt to [facility] and admit to medicine for cholelithiasis and elevated LFTs. He states he will consult on the pt. Consulted hospitalist. Dr. Tamala Julian agrees to admission. Discussed results and plan for admission with patient and family.    Final Clinical Impressions(s) / ED Diagnoses   Final diagnoses:  Elevated LFTs  Abdominal pain  Chest pain  Calculus of gallbladder without cholecystitis without obstruction    New Prescriptions New Prescriptions   No medications on file   I personally performed the services described in this  documentation, which was scribed in my presence. The recorded information has been reviewed and is accurate.    Chesley [place], [place] 06/05/16 0007    Julianne Rice, MD 06/07/16 [phone]

## 2016-06-04 NOTE — ED Triage Notes (Signed)
Pt states she has had back and chest pain x 1 week.  Went to see PCP and they sent her downstairs

## 2016-06-04 NOTE — Progress Notes (Signed)
Pre visit review using our clinic review tool, if applicable. No additional management support is needed unless otherwise documented below in the visit note/SLS  

## 2016-06-04 NOTE — Progress Notes (Signed)
Subjective:    Patient ID: Monique [name], female    DOB: [birth year], 75 y.o.   MRN: [id number]  HPI  Pt in with some abdomen pain. Also some back pain. Pain stats pain in stomach started about one week ago. Some back pain. She also states some chest discomfortt. Pain chest pain occurred earlier today around 9 am. Pain 2-3 minutes. Pain level was about 5/10.Pain in back at that time as well. Pt states some chest pain yesterday as well. States every other day chest pain for about a week. Most of time brief pain 2-3 minutes. Other times may last longer. Pain can occur at rest. Not associated with activity.Often  associates back pain with chest pain.  Pt abdomen pain occurs intermittenlty as well. Pt denies any classic heartburn hx. But she is on famotidine.  Pt denies shortness of breath.   Pt denies recent belching.  Pt describes mid thoracic area pain and tight shoulders.  Pt has never been on cholesterol medicine. Mild high ldl in the past. No diabetes on review. Pt does have history of htn.  Bp is well controlled today.  Pt wonders if stress is causing or making her symptoms work. 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 Pt just placed her mom in assisted care facility. Pt visits her mom a lot. Getting her mom moved in was very stressful.   Pt family history positive for heart problems. Pt not sure. Pt brother and sister no cardiac history. Mom has no mi hx.(but was CAD per pt)    Review of Systems  Constitutional: Negative for chills and fatigue.  HENT: Negative for congestion.   Respiratory: Negative for cough, chest tightness, shortness of breath and wheezing.   Cardiovascular: Positive for chest pain. Negative for palpitations.       Ealier today.  Gastrointestinal: Positive for abdominal pain.       Hx of but not presently.  Musculoskeletal: Positive for back pain.       Upper back pain  and associates with chest pain.  Skin:       No rash reported.  Psychiatric/Behavioral: Negative  for agitation and behavioral problems.    Past Medical History:  Diagnosis Date  . Allergy   . Anemia    when had uterine fibroids  . Anxiety   . Cataract    very small  . Cholelithiasis   . DJD (degenerative joint disease)   . Dry eye syndrome   . Headache(784.0)   . Hyperlipidemia 01/20/2011  . Hypertension 09/29/2006  . Thrombocytopenia ([place]) 08/27/2011   hematology evaluation 08-2011, return prn  . Urticaria   . Vasomotor rhinitis      Social History   Social History  . Marital status: Widowed    Spouse name: N/A  . Number of children: 2  . Years of education: N/A   Occupational History  . 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 Smoking status: Never Smoker  . Smokeless tobacco: Never Used  . Alcohol use No  . Drug use: No  . Sexual activity: Not on file   Other Topics Concern  . Not on file   Social History Narrative   husband had prostate ca , passed away 10-04-2013   takes care of mom, 39 y/o who is blind , and two other fam members            Past Surgical History:  Procedure Laterality Date  .  ABDOMINAL HYSTERECTOMY     no oophorectomy  . APPENDECTOMY    . COLONOSCOPY    . DILATION AND CURETTAGE OF UTERUS    . mole removals    . TONSILLECTOMY    . UMBILICAL HERNIA REPAIR      Family History  Problem Relation Age of Onset  . Coronary artery disease Father   . Cancer Father     unsure of type  . Other Father     Guillain- barre syndrome  . Diabetes Other     uncles   . Hypertension Mother   . Anxiety disorder Mother   . Depression Mother   . Prostate cancer Other     grandfather  . Breast cancer Sister 45  . Depression Maternal Uncle   . Colon cancer Neg Hx   . Colon polyps Neg Hx   . Rectal cancer Neg Hx   . Stomach cancer Neg Hx     Allergies  Allergen Reactions  . Bee Venom Anaphylaxis  . Doxycycline Hives and Itching  . Penicillins Itching, Nausea And Vomiting and Swelling    syncope  . Sertraline  Other (See Comments)    Dry Mouth  . Azithromycin Itching and Rash  . Cerumenex [Trolamine] Itching and Rash  . Climara [Estradiol] Itching and Rash  . Nickel Rash    Current Outpatient Prescriptions on File Prior to Visit  Medication Sig Dispense Refill  . aspirin 81 MG tablet Take 81 mg by mouth daily with breakfast.     . Calcium-Vitamin D (CALCIUM + D PO) Take 2 tablets by mouth daily with lunch.     . carvedilol (COREG) 6.25 MG tablet Take 1 tablet (6.25 mg total) by mouth 2 (two) times daily with a meal. 180 tablet 3  . cetirizine (ZYRTEC) 10 MG tablet Take 1 tablet by mouth daily as needed.    . cycloSPORINE (RESTASIS) 0.05 % ophthalmic emulsion Place 1 drop into both eyes every 12 (twelve) hours.     Marland Kitchen dextromethorphan (DELSYM) 30 MG/5ML liquid Take 15 mg by mouth as needed for cough.    . diphenhydrAMINE (BENADRYL) 25 MG tablet Take 25 mg by mouth every 6 (six) hours as needed for allergies. Reported on 08/06/2015    . EPINEPHrine (EPIPEN 2-PAK) 0.3 mg/0.3 mL IJ SOAJ injection Inject 0.3 mLs (0.3 mg total) into the muscle once. 1 Device 1  . famotidine (PEPCID) 20 MG tablet Take 1 tablet (20 mg total) by mouth 2 (two) times daily. (Patient taking differently: Take 20 mg by mouth daily. ) 20 tablet 0  . fluticasone (FLONASE) 50 MCG/ACT nasal spray Place 1-2 sprays into both nostrils daily as needed for allergies or rhinitis.    Marland Kitchen glucosamine-chondroitin 500-400 MG tablet Take 1 tablet by mouth 3 (three) times daily.      . montelukast (SINGULAIR) 10 MG tablet Take 1 tablet (10 mg total) by mouth at bedtime.    . Multiple Vitamin (MULTIVITAMIN WITH MINERALS) TABS Take 1 tablet by mouth daily with breakfast.    . Polyethyl Glycol-Propyl Glycol (SYSTANE ULTRA) 0.4-0.3 % SOLN Place 1 drop into both eyes 3 (three) times daily as needed (dry eyes).     Marland Kitchen spironolactone (ALDACTONE) 25 MG tablet Take 1 tablet (25 mg total) by mouth daily. 90 tablet 3   Current Facility-Administered  Medications on File Prior to Visit  Medication Dose Route Frequency Provider Last Rate Last Dose  . 0.9 %  sodium chloride infusion  500 mL Intravenous  Continuous Jerene Bears, MD        BP 120/72 (BP Location: Left Arm, Patient Position: Sitting, Cuff Size: Large)   Pulse 84   Temp 97.4 F (36.3 C)   Resp 16   Ht 5\' 2"  (1.575 m)   Wt 168 lb 2 oz (76.3 kg)   SpO2 98%   BMI 30.75 kg/m       Objective:   Physical Exam  General Mental Status- Alert. General Appearance- Not in acute distress.   Skin General: Color- Normal Color. Moisture- Normal Moisture.  Neck Carotid Arteries- Normal color. Moisture- Normal Moisture. No carotid bruits. No JVD.  Chest and Lung Exam Auscultation: Breath Sounds:-Normal.  Cardiovascular Auscultation:Rythm- Regular. Murmurs & Other Heart Sounds:Auscultation of the heart reveals- No Murmurs.  Abdomen Inspection:-Inspeection Normal. Palpation/Percussion:Note:No mass. Palpation and Percussion of the abdomen reveal- Non Tender, Non Distended + BS, no rebound or guarding.    Neurologic Cranial Nerve exam:- CN III-XII intact(No nystagmus), symmetric smile. Strength:- 5/5 equal and symmetric strength both upper and lower extremities.  Back- no cva tenderness.      Assessment & Plan:  With your recent episodes of chest and thoracic back pain I do think best to be evaluated in the ED. You benefit from cxr, basic labs and troponin. You have some risk factors of htn, hyperlipidemia and family history CAD in mom.  Pt ekg today looked nsr but artifact on v5 lead.  At this time of the day best to have studies done through the ED.   I have called downstairs and notified ED MD of your history and presentation. Pt agreed to go immediatley

## 2016-06-04 NOTE — Patient Instructions (Signed)
With your recent episodes of chest and thoracic back pain I do think best to be evaluated in the ED. You benefit from cxr, basic labs and troponin. You have some risk factors of htn, hyperlipidemia and family history CAD in mom.  At this time of the day best to have studies done through the ED.   I have called downstairs and notified ED MD of your history and presentation.

## 2016-06-05 ENCOUNTER — Observation Stay (HOSPITAL_COMMUNITY): Payer: Medicare Other

## 2016-06-05 ENCOUNTER — Inpatient Hospital Stay (HOSPITAL_COMMUNITY): Payer: Medicare Other

## 2016-06-05 DIAGNOSIS — K8064 Calculus of gallbladder and bile duct with chronic cholecystitis without obstruction: Secondary | ICD-10-CM | POA: Diagnosis not present

## 2016-06-05 DIAGNOSIS — R109 Unspecified abdominal pain: Secondary | ICD-10-CM | POA: Diagnosis not present

## 2016-06-05 DIAGNOSIS — R740 Nonspecific elevation of levels of transaminase and lactic acid dehydrogenase [LDH]: Secondary | ICD-10-CM | POA: Diagnosis not present

## 2016-06-05 DIAGNOSIS — R945 Abnormal results of liver function studies: Secondary | ICD-10-CM

## 2016-06-05 DIAGNOSIS — R7989 Other specified abnormal findings of blood chemistry: Secondary | ICD-10-CM | POA: Diagnosis not present

## 2016-06-05 DIAGNOSIS — F419 Anxiety disorder, unspecified: Secondary | ICD-10-CM | POA: Diagnosis not present

## 2016-06-05 DIAGNOSIS — K8020 Calculus of gallbladder without cholecystitis without obstruction: Secondary | ICD-10-CM

## 2016-06-05 DIAGNOSIS — D649 Anemia, unspecified: Secondary | ICD-10-CM

## 2016-06-05 DIAGNOSIS — K219 Gastro-esophageal reflux disease without esophagitis: Secondary | ICD-10-CM | POA: Diagnosis not present

## 2016-06-05 DIAGNOSIS — R1084 Generalized abdominal pain: Secondary | ICD-10-CM

## 2016-06-05 DIAGNOSIS — I10 Essential (primary) hypertension: Secondary | ICD-10-CM | POA: Diagnosis not present

## 2016-06-05 DIAGNOSIS — E785 Hyperlipidemia, unspecified: Secondary | ICD-10-CM | POA: Diagnosis not present

## 2016-06-05 DIAGNOSIS — Z79899 Other long term (current) drug therapy: Secondary | ICD-10-CM | POA: Diagnosis not present

## 2016-06-05 DIAGNOSIS — K831 Obstruction of bile duct: Secondary | ICD-10-CM | POA: Diagnosis not present

## 2016-06-05 DIAGNOSIS — J309 Allergic rhinitis, unspecified: Secondary | ICD-10-CM | POA: Diagnosis not present

## 2016-06-05 DIAGNOSIS — Z7982 Long term (current) use of aspirin: Secondary | ICD-10-CM | POA: Diagnosis not present

## 2016-06-05 DIAGNOSIS — K8050 Calculus of bile duct without cholangitis or cholecystitis without obstruction: Secondary | ICD-10-CM | POA: Diagnosis not present

## 2016-06-05 DIAGNOSIS — R933 Abnormal findings on diagnostic imaging of other parts of digestive tract: Secondary | ICD-10-CM | POA: Diagnosis not present

## 2016-06-05 DIAGNOSIS — K8010 Calculus of gallbladder with chronic cholecystitis without obstruction: Secondary | ICD-10-CM | POA: Diagnosis not present

## 2016-06-05 DIAGNOSIS — R932 Abnormal findings on diagnostic imaging of liver and biliary tract: Secondary | ICD-10-CM | POA: Diagnosis not present

## 2016-06-05 DIAGNOSIS — R935 Abnormal findings on diagnostic imaging of other abdominal regions, including retroperitoneum: Secondary | ICD-10-CM | POA: Diagnosis not present

## 2016-06-05 DIAGNOSIS — R748 Abnormal levels of other serum enzymes: Secondary | ICD-10-CM | POA: Diagnosis not present

## 2016-06-05 DIAGNOSIS — K8065 Calculus of gallbladder and bile duct with chronic cholecystitis with obstruction: Secondary | ICD-10-CM | POA: Diagnosis not present

## 2016-06-05 DIAGNOSIS — K759 Inflammatory liver disease, unspecified: Secondary | ICD-10-CM | POA: Diagnosis not present

## 2016-06-05 LAB — TROPONIN I: Troponin I: <0.03 ng/mL (ref <0.03)

## 2016-06-05 LAB — SURGICAL PCR SCREEN
MRSA, PCR: NEGATIVE
Staphylococcus aureus: NEGATIVE

## 2016-06-05 LAB — COMPREHENSIVE METABOLIC PANEL
ALT: 275 U/L — ABNORMAL HIGH (ref 14–54)
AST: 436 U/L — ABNORMAL HIGH (ref 15–41)
Albumin: 3.9 g/dL (ref 3.5–5.0)
Alkaline Phosphatase: 99 U/L (ref 38–126)
Anion gap: 7 (ref 5–15)
BUN: 11 mg/dL (ref 6–20)
CO2: 27 mmol/L (ref 22–32)
Calcium: 9.2 mg/dL (ref 8.9–10.3)
Chloride: 105 mmol/L (ref 101–111)
Creatinine, Ser: 0.81 mg/dL (ref 0.44–1.00)
GFR calc Af Amer: >60 mL/min (ref >60)
GFR calc non Af Amer: >60 mL/min (ref >60)
Glucose, Bld: 103 mg/dL — ABNORMAL HIGH (ref 65–99)
Potassium: 3.8 mmol/L (ref 3.5–5.1)
Sodium: 139 mmol/L (ref 135–145)
Total Bilirubin: 3.3 mg/dL — ABNORMAL HIGH (ref 0.3–1.2)
Total Protein: 6.3 g/dL — ABNORMAL LOW (ref 6.5–8.1)

## 2016-06-05 LAB — CBC
HCT: 38.5 % (ref 36.0–46.0)
Hemoglobin: 12.4 g/dL (ref 12.0–15.0)
MCH: 23 pg — ABNORMAL LOW (ref 26.0–34.0)
MCHC: 32.2 g/dL (ref 30.0–36.0)
MCV: 71.6 fL — ABNORMAL LOW (ref 78.0–100.0)
Platelets: 135 10*3/uL — ABNORMAL LOW (ref 150–400)
RBC: 5.38 MIL/uL — ABNORMAL HIGH (ref 3.87–5.11)
RDW: 15.1 % (ref 11.5–15.5)
WBC: 8.1 10*3/uL (ref 4.0–10.5)

## 2016-06-05 MED ORDER — ONDANSETRON HCL 4 MG/2ML IJ SOLN: 4.0000 mg | Freq: Four times a day (QID) | INTRAMUSCULAR | Status: DC | PRN

## 2016-06-05 MED ORDER — CARVEDILOL 6.25 MG PO TABS
6.2500 mg | ORAL_TABLET | Freq: Two times a day (BID) | ORAL | Status: DC
  Administered 2016-06-05: 6.25 mg via ORAL
  Filled 2016-06-05 (×2): qty 1

## 2016-06-05 MED ORDER — FAMOTIDINE 20 MG PO TABS
20.0000 mg | ORAL_TABLET | Freq: Every day | ORAL | Status: DC
  Filled 2016-06-05: qty 1

## 2016-06-05 MED ORDER — CYCLOSPORINE 0.05 % OP EMUL
1.0000 [drp] | Freq: Two times a day (BID) | OPHTHALMIC | Status: DC
  Administered 2016-06-05 – 2016-06-06 (×3): 1 [drp] via OPHTHALMIC
  Filled 2016-06-05 (×3): qty 1

## 2016-06-05 MED ORDER — HEPARIN SODIUM (PORCINE) 5000 UNIT/ML IJ SOLN
5000.0000 [IU] | Freq: Three times a day (TID) | INTRAMUSCULAR | Status: AC
  Administered 2016-06-05 (×3): 5000 [IU] via SUBCUTANEOUS
  Filled 2016-06-05 (×3): qty 1

## 2016-06-05 MED ORDER — ONDANSETRON HCL 4 MG PO TABS: 4.0000 mg | ORAL_TABLET | Freq: Four times a day (QID) | ORAL | Status: DC | PRN

## 2016-06-05 MED ORDER — MONTELUKAST SODIUM 10 MG PO TABS
10.0000 mg | ORAL_TABLET | Freq: Every day | ORAL | Status: DC
  Administered 2016-06-05: 10 mg via ORAL
  Filled 2016-06-05: qty 1

## 2016-06-05 MED ORDER — CIPROFLOXACIN IN D5W 400 MG/200ML IV SOLN
400.0000 mg | INTRAVENOUS | Status: AC
  Administered 2016-06-06: 400 mg via INTRAVENOUS

## 2016-06-05 MED ORDER — POLYVINYL ALCOHOL 1.4 % OP SOLN
1.0000 [drp] | Freq: Every day | OPHTHALMIC | Status: DC | PRN
  Filled 2016-06-05: qty 15

## 2016-06-05 MED ORDER — SPIRONOLACTONE 25 MG PO TABS
25.0000 mg | ORAL_TABLET | Freq: Every day | ORAL | Status: DC
  Filled 2016-06-05: qty 1

## 2016-06-05 MED ORDER — ASPIRIN EC 81 MG PO TBEC
81.0000 mg | DELAYED_RELEASE_TABLET | Freq: Every day | ORAL | Status: DC
  Filled 2016-06-05: qty 1

## 2016-06-05 MED ORDER — SODIUM CHLORIDE 0.9 % IV SOLN
INTRAVENOUS | Status: DC
  Administered 2016-06-05 (×2): 100 mL/h via INTRAVENOUS
  Administered 2016-06-06: 1000 mL via INTRAVENOUS

## 2016-06-05 MED ORDER — DIPHENHYDRAMINE HCL 25 MG PO CAPS: 25.0000 mg | ORAL_CAPSULE | Freq: Four times a day (QID) | ORAL | Status: DC | PRN

## 2016-06-05 MED ORDER — FLUTICASONE PROPIONATE 50 MCG/ACT NA SUSP
1.0000 | Freq: Every day | NASAL | Status: DC | PRN
  Filled 2016-06-05: qty 16

## 2016-06-05 MED ORDER — CHLORHEXIDINE GLUCONATE CLOTH 2 % EX PADS: 6.0000 | MEDICATED_PAD | Freq: Once | CUTANEOUS | Status: DC

## 2016-06-05 MED ORDER — LORATADINE 10 MG PO TABS
10.0000 mg | ORAL_TABLET | Freq: Every day | ORAL | Status: DC
  Filled 2016-06-05: qty 1

## 2016-06-05 MED ORDER — GADOBENATE DIMEGLUMINE 529 MG/ML IV SOLN: 20.0000 mL | Freq: Once | INTRAVENOUS | Status: DC | PRN

## 2016-06-05 MED ORDER — ALBUTEROL SULFATE (2.5 MG/3ML) 0.083% IN NEBU: 2.5000 mg | INHALATION_SOLUTION | RESPIRATORY_TRACT | Status: DC | PRN

## 2016-06-05 MED ORDER — MORPHINE SULFATE (PF) 2 MG/ML IV SOLN
1.0000 mg | INTRAVENOUS | Status: DC | PRN
  Administered 2016-06-05: 1 mg via INTRAVENOUS
  Filled 2016-06-05: qty 1

## 2016-06-05 MED ORDER — CHLORHEXIDINE GLUCONATE CLOTH 2 % EX PADS
6.0000 | MEDICATED_PAD | Freq: Once | CUTANEOUS | Status: AC
  Administered 2016-06-05: 6 via TOPICAL

## 2016-06-05 NOTE — Progress Notes (Signed)
Monique [name]  [birth year] XV:[id number]  Patient Care Team: Colon Branch, MD as PCP - Cotter, OD as Referring Physician (Optometry) Mosetta Anis, MD as Referring Physician (Allergy) Sanjuana Kava, MD as Referring Physician (Obstetrics and Gynecology) Jerene Bears, MD as Consulting Physician (Gastroenterology)  This patient is a 75 y.o.female whom arrives to [facility] ED & called for surgical evaluation by Monique Ramus, PA-C.Marland Kitchen   Reason for call: Abdominal pain & gallstones  Called by physician Asst at [facility]  Concern for 75 year old lady who has had one week of chest & abdominal pain. Somewhat vague.  No problems with eating or biliary colic.  Apparently LFTs mildly elevated.  An ultrasound showed gallstones but no cholecystitis.  Some upper abdominal discomfort but no real Murphy sign.  Patient's pain under better control but not totally gone away.  Called for advice.  I noted the chest x-ray shows a widened mediastinum.  I recommended they follow the advice of the radiologist and order a stat chest CT to rule out anything wrong with the thoracic aorta such as an aneurysm or dissection.  If they are worried that her pain is not adequately controlled, contrast the patient [name] for surgical consultation.  I am skeptical at this moment that she truly has definite gallbladder problems but it is possible she could have early cholecystitis or gallstone pancreatitis/choledocholithiasis.  Possibly has hepatitis.  Pressing possibly has thoracic issues.  We will be happy to see the patient and offer surgical consultation if the patient does transfer to [facility] or [facility].  Patient Active Problem List   Diagnosis Date Noted  . Cholelithiasis 06/05/2016  . Anemia 06/05/2016  . GERD (gastroesophageal reflux disease) 06/05/2016  . Cholelithiases 06/04/2016  . PCP NOTES >>>>>>>>>>>>>>>>>>>>>>>>> 02/04/2016  . Severe allergic reaction 08/17/2014  . Allergic  rhinitis 12/01/2011  . Thrombocytopenia ([place]) 08/27/2011  . Hyperlipidemia 01/20/2011  . Annual physical exam 09/18/2010  . Pre-diabetes 09/17/2009  . Backache 07/10/2008  . ANXIETY 01/19/2008  . HX OF GALLSTONE 10/20/2006  . Essential hypertension 09/29/2006    Past Medical History:  Diagnosis Date  . Allergy   . Anemia    when had uterine fibroids  . Anxiety   . Cataract    very small  . Cholelithiasis   . DJD (degenerative joint disease)   . Dry eye syndrome   . Headache(784.0)   . Hyperlipidemia 01/20/2011  . Hypertension 09/29/2006  . Thrombocytopenia ([place]) 08/27/2011   hematology evaluation 08-2011, return prn  . Urticaria   . Vasomotor rhinitis     Past Surgical History:  Procedure Laterality Date  . ABDOMINAL HYSTERECTOMY     no oophorectomy  . APPENDECTOMY    . COLONOSCOPY    . DILATION AND CURETTAGE OF UTERUS    . mole removals    . TONSILLECTOMY    . UMBILICAL HERNIA REPAIR      Social History   Social History  . Marital status: Widowed    Spouse name: N/A  . Number of children: 2  . Years of education: N/A   Occupational History  . 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 Smoking status: Never Smoker  . Smokeless tobacco: Never Used  . Alcohol use No  . Drug use: No  . Sexual activity: Not on file   Other Topics Concern  . Not on file   Social History Narrative  husband had prostate ca , passed away 10/24/2013   takes care of mom, 81 y/o who is blind , and two other fam members            Family History  Problem Relation Age of Onset  . Coronary artery disease Father   . Cancer Father     unsure of type  . Other Father     Guillain- barre syndrome  . Diabetes Other     uncles   . Hypertension Mother   . Anxiety disorder Mother   . Depression Mother   . Prostate cancer Other     grandfather  . Breast cancer Sister 22  . Depression Maternal Uncle   . Colon cancer Neg Hx   . Colon polyps Neg Hx   .  Rectal cancer Neg Hx   . Stomach cancer Neg Hx     Current Facility-Administered Medications  Medication Dose Route Frequency Provider Last Rate Last Dose  . 0.9 %  sodium chloride infusion   Intravenous Continuous Norval Morton, MD 100 mL/hr at 06/05/16 0446 100 mL/hr at 06/05/16 0446  . albuterol (PROVENTIL) (2.5 MG/3ML) 0.083% nebulizer solution 2.5 mg  2.5 mg Nebulization Q2H PRN Norval Morton, MD      . aspirin EC tablet 81 mg  81 mg Oral Q breakfast Rondell A Tamala Julian, MD      . carvedilol (COREG) tablet 6.25 mg  6.25 mg Oral BID WC Rondell A Tamala Julian, MD      . cycloSPORINE (RESTASIS) 0.05 % ophthalmic emulsion 1 drop  1 drop Both Eyes Q12H Rondell A Smith, MD      . diphenhydrAMINE (BENADRYL) capsule 25 mg  25 mg Oral Q6H PRN Norval Morton, MD      . famotidine (PEPCID) tablet 20 mg  20 mg Oral Daily Rondell A Tamala Julian, MD      . fluticasone (FLONASE) 50 MCG/ACT nasal spray 1-2 spray  1-2 spray Each Nare Daily PRN Norval Morton, MD      . heparin injection 5,000 Units  5,000 Units Subcutaneous Q8H Norval Morton, MD   5,000 Units at 06/05/16 0502  . loratadine (CLARITIN) tablet 10 mg  10 mg Oral Daily Rondell A Tamala Julian, MD      . montelukast (SINGULAIR) tablet 10 mg  10 mg Oral QHS Rondell A Tamala Julian, MD      . morphine 2 MG/ML injection 1-2 mg  1-2 mg Intravenous Q3H PRN Norval Morton, MD      . ondansetron (ZOFRAN) tablet 4 mg  4 mg Oral Q6H PRN Norval Morton, MD       Or  . ondansetron (ZOFRAN) injection 4 mg  4 mg Intravenous Q6H PRN Rondell A Tamala Julian, MD      . polyvinyl alcohol (LIQUIFILM TEARS) 1.4 % ophthalmic solution 1 drop  1 drop Both Eyes Daily PRN Norval Morton, MD      . spironolactone (ALDACTONE) tablet 25 mg  25 mg Oral Daily Norval Morton, MD         Allergies  Allergen Reactions  . Bee Venom Anaphylaxis  . Doxycycline Hives and Itching  . Penicillins Itching, Nausea And Vomiting and Swelling    syncope  . Sertraline Other (See Comments)    Dry Mouth  .  Azithromycin Itching and Rash  . Cerumenex [Trolamine] Itching and Rash  . Climara [Estradiol] Itching and Rash  . Nickel Rash    BP (!) 104/54 (BP  Location: Left Arm)   Pulse 80   Temp 99.3 F (37.4 C) (Oral) Comment: IS given  Resp 16   Ht 5\' 2"  (1.575 m)   Wt 74.4 kg (164 lb)   SpO2 98%   BMI 30.00 kg/m   Dg Chest 2 View  Result Date: 06/04/2016 CLINICAL DATA:  Cough with chest pain. EXAM: CHEST  2 VIEW COMPARISON:  08/17/2014 FINDINGS: The cardio pericardial silhouette is enlarged. Prominence of the thoracic aorta raises the question of thoracic aortic aneurysm. The lungs are clear bilaterally. The visualized bony structures of the thorax are intact. IMPRESSION: 1. No acute cardiopulmonary findings. 2. Cardiomegaly with thoracic aortic prominence, unchanged in the interval, but raising concern for aneurysm. CT chest could be used to further evaluate as clinically warranted. Electronically Signed   By: Misty Stanley M.D.   On: 06/04/2016 18:46   US Abdomen Complete  Result Date: 06/04/2016 CLINICAL DATA:  Midline abdominal pain radiating to central chest x1 week intermittently. Nausea intermittently. History of umbilical surgery years ago. EXAM: ABDOMEN ULTRASOUND COMPLETE COMPARISON:  CT report from 12/27/2000 FINDINGS: Gallbladder: The gallbladder is contracted. There is a gallstone in the body of the gallbladder measuring 2.6 x 1.3 x 2.1 cm. No sonographic Murphy's sign was elicited. Gallbladder wall thickness is top normal at 3 mm. Common bile duct: Diameter: Normal at 3.6 mm Liver: Slightly coarsened and increased in echogenicity without space-occupying mass or biliary dilatation. IVC: No abnormality visualized. Pancreas: There is an ovoid 1.3 x 0.4 cm area of hypoechogenicity along the ventral pancreatic head and body. Spleen: Size and appearance within normal limits. Right Kidney: Length: 9 cm. Echogenicity within normal limits. No mass or hydronephrosis visualized. Left Kidney:  Length: 9.9 cm. Echogenicity within normal limits. No mass or hydronephrosis visualized. Abdominal aorta: No aneurysm visualized. Other findings: None. IMPRESSION: Uncomplicated cholelithiasis with a gallstone measuring 2.6 x 1.3 x 2.1 cm noted. No secondary signs of acute cholecystitis. 1.3 x 0.4 cm of hypoechogenicity along the ventral pancreatic head and body, nonspecific in etiology. CT without and with IV contrast may help for further correlation. No ductal dilatation is seen. i Electronically Signed   By: Ashley Royalty M.D.   On: 06/04/2016 21:07   Ct Angio Chest Aorta W/cm &/or Wo/cm  Result Date: 06/04/2016 CLINICAL DATA:  Chest, abdominal and back pain. EXAM: CT ANGIOGRAPHY CHEST, ABDOMEN AND PELVIS TECHNIQUE: Multidetector CT imaging through the chest, abdomen and pelvis was performed using the standard protocol during bolus administration of intravenous contrast. Multiplanar reconstructed images and MIPs were obtained and reviewed to evaluate the vascular anatomy. CONTRAST:  100 cc Isovue 370 IV COMPARISON:  Chest radiograph earlier this day. FINDINGS: CTA CHEST FINDINGS Cardiovascular: Thoracic aortic tortuosity without dissection or aneurysm. Maximal ascending aortic dimension 3.6 cm. Left vertebral artery arises directly from the aortic arch, a normal variant. No aortic hematoma. There is scattered mild atherosclerosis. Mild multi chamber cardiomegaly. Mediastinum/Nodes: No mediastinal, hilar, or axillary adenopathy. Left axillary lymph nodes with normal fatty hila. No pericardial effusion. The esophagus is patulous. No focal abnormality in the visualized thyroid gland. Lungs/Pleura: Mild breathing motion artifact. Tiny calcified granuloma in the right lower lobe. Scattered linear atelectasis. No consolidation. No evidence pulmonary edema. No pleural fluid. Musculoskeletal: Multilevel degenerative change throughout the spine. There are no acute or suspicious osseous abnormalities. Review of the MIP  images confirms the above findings. CTA ABDOMEN AND PELVIS FINDINGS VASCULAR Aorta: Normal caliber aorta without aneurysm, dissection, vasculitis or significant stenosis. Celiac: Patent  without evidence of aneurysm, dissection, vasculitis or significant stenosis. SMA: Patent without evidence of aneurysm, dissection, vasculitis or significant stenosis. Renals: Both renal arteries are patent without evidence of aneurysm, dissection, vasculitis, fibromuscular dysplasia or significant stenosis. IMA: Patent without evidence of aneurysm, dissection, vasculitis or significant stenosis. Inflow: Patent without evidence of aneurysm, dissection, vasculitis or significant stenosis. Veins: No obvious venous abnormality within the limitations of this arterial phase study. Review of the MIP images confirms the above findings. NON-VASCULAR Hepatobiliary: Gallstone on ultrasound earlier this day is not visualized by CT. No abnormal gallbladder distention. Normal arterial phase appearance of the liver. Pancreas: No definite CT correlate with the hypo echogenicity adjacent of the ventral pancreatic head on ultrasound. No ductal dilatation or inflammation. Spleen: Normal arterial phase appearance. Adrenals/Urinary Tract: No adrenal nodule. No hydronephrosis or perinephric edema. Symmetric enhancement. Ureters are decompressed. Urinary bladder is mildly distended, no wall thickening. Stomach/Bowel: Significant colonic redundancy. A few diverticula at the hepatic flexure. No colonic inflammation. No small bowel dilatation or wall thickening. Appendix is not visualized, surgically absent. Stomach is distended with ingested contents. Lymphatic: No abdominal or pelvic adenopathy. Reproductive: Status post hysterectomy. No adnexal masses. Other: No free air free fluid.  Fat containing umbilical hernia. Musculoskeletal: Degenerative change throughout the lumbar spine with predominant lower facet arthropathy. Anterolisthesis of L4 on L5  appears degenerative. There are no acute or suspicious osseous abnormalities. Review of the MIP images confirms the above findings. IMPRESSION: 1. No acute aortic abnormality. Tortuous thoracic aorta without dissection or aneurysm. 2. No acute abnormality in the chest, abdomen, or pelvis. 3. Patulous esophagus, can be seen with reflux. 4. No definite peripancreatic abnormality corresponding to the hypoechoic structure on ultrasound. Electronically Signed   By: Jeb Levering M.D.   On: 06/04/2016 23:14   Ct Angio Abd/pel W/ And/or W/o  Result Date: 06/04/2016 CLINICAL DATA:  Chest, abdominal and back pain. EXAM: CT ANGIOGRAPHY CHEST, ABDOMEN AND PELVIS TECHNIQUE: Multidetector CT imaging through the chest, abdomen and pelvis was performed using the standard protocol during bolus administration of intravenous contrast. Multiplanar reconstructed images and MIPs were obtained and reviewed to evaluate the vascular anatomy. CONTRAST:  100 cc Isovue 370 IV COMPARISON:  Chest radiograph earlier this day. FINDINGS: CTA CHEST FINDINGS Cardiovascular: Thoracic aortic tortuosity without dissection or aneurysm. Maximal ascending aortic dimension 3.6 cm. Left vertebral artery arises directly from the aortic arch, a normal variant. No aortic hematoma. There is scattered mild atherosclerosis. Mild multi chamber cardiomegaly. Mediastinum/Nodes: No mediastinal, hilar, or axillary adenopathy. Left axillary lymph nodes with normal fatty hila. No pericardial effusion. The esophagus is patulous. No focal abnormality in the visualized thyroid gland. Lungs/Pleura: Mild breathing motion artifact. Tiny calcified granuloma in the right lower lobe. Scattered linear atelectasis. No consolidation. No evidence pulmonary edema. No pleural fluid. Musculoskeletal: Multilevel degenerative change throughout the spine. There are no acute or suspicious osseous abnormalities. Review of the MIP images confirms the above findings. CTA ABDOMEN AND  PELVIS FINDINGS VASCULAR Aorta: Normal caliber aorta without aneurysm, dissection, vasculitis or significant stenosis. Celiac: Patent without evidence of aneurysm, dissection, vasculitis or significant stenosis. SMA: Patent without evidence of aneurysm, dissection, vasculitis or significant stenosis. Renals: Both renal arteries are patent without evidence of aneurysm, dissection, vasculitis, fibromuscular dysplasia or significant stenosis. IMA: Patent without evidence of aneurysm, dissection, vasculitis or significant stenosis. Inflow: Patent without evidence of aneurysm, dissection, vasculitis or significant stenosis. Veins: No obvious venous abnormality within the limitations of this arterial phase study. Review of the MIP  images confirms the above findings. NON-VASCULAR Hepatobiliary: Gallstone on ultrasound earlier this day is not visualized by CT. No abnormal gallbladder distention. Normal arterial phase appearance of the liver. Pancreas: No definite CT correlate with the hypo echogenicity adjacent of the ventral pancreatic head on ultrasound. No ductal dilatation or inflammation. Spleen: Normal arterial phase appearance. Adrenals/Urinary Tract: No adrenal nodule. No hydronephrosis or perinephric edema. Symmetric enhancement. Ureters are decompressed. Urinary bladder is mildly distended, no wall thickening. Stomach/Bowel: Significant colonic redundancy. A few diverticula at the hepatic flexure. No colonic inflammation. No small bowel dilatation or wall thickening. Appendix is not visualized, surgically absent. Stomach is distended with ingested contents. Lymphatic: No abdominal or pelvic adenopathy. Reproductive: Status post hysterectomy. No adnexal masses. Other: No free air free fluid.  Fat containing umbilical hernia. Musculoskeletal: Degenerative change throughout the lumbar spine with predominant lower facet arthropathy. Anterolisthesis of L4 on L5 appears degenerative. There are no acute or suspicious  osseous abnormalities. Review of the MIP images confirms the above findings. IMPRESSION: 1. No acute aortic abnormality. Tortuous thoracic aorta without dissection or aneurysm. 2. No acute abnormality in the chest, abdomen, or pelvis. 3. Patulous esophagus, can be seen with reflux. 4. No definite peripancreatic abnormality corresponding to the hypoechoic structure on ultrasound. Electronically Signed   By: Jeb Levering M.D.   On: 06/04/2016 23:14    Note: This dictation was prepared with Dragon/digital dictation along with [employer]. Any transcriptional errors that result from this process are unintentional.   .Adin Hector, M.D., F.A.C.S. Gastrointestinal and Minimally Invasive Surgery [facility] [address] [place], [place] [address] [phone] Main / Paging  06/05/2016 7:48 AM

## 2016-06-05 NOTE — Consult Note (Signed)
[facility] Surgery Consult Note  Monique [name] [birth year]  [id number].    Requesting MD: Lita Mains, MD Chief Complaint/Reason for Consult: cholelithiasis HPI:  75 y/o female with multiple medical problems including but not limited to diverticulitis, HTN, HLD, and anemia who presents to [name] ED from PCP with intermitted upper abdominal pain that radiates to her back for 1 week. She also endorses nausea and chest discomfort/pressure. Pain rated as 3/10. Tried taking aspirin with no relief. Denies pain associated with meals. Denies fever, changes in bowel habits. or urinary symptoms. Denies use of blood thinning medications. Previous abdominal surgeries include umblical hernia repair, appendectomy, and abdominal hysterectomy.   ED workup significant for a gallstone on RUQ ultrasound, CTA neg for acute aortic pathology, WBC WNL Hepatic Function Latest Ref Rng & Units 06/05/2016 06/04/2016 02/04/2016  Total Protein 6.5 - 8.1 g/dL 6.3(L) 6.8 -  Albumin 3.5 - 5.0 g/dL 3.9 4.0 -  AST 15 - 41 U/L 436(H) 297(H) 21  ALT 14 - 54 U/L 275(H) 138(H) 18  Alk Phosphatase 38 - 126 U/L 99 66 -  Total Bilirubin 0.3 - 1.2 mg/dL 3.3(H) 1.6(H) -  Bilirubin, Direct 0.0 - 0.3 mg/dL - - -    ROS: Review of Systems  Constitutional: Negative for chills, fever and weight loss.  Eyes: Negative for blurred vision.  Respiratory: Negative for cough and shortness of breath.   Cardiovascular: Negative for chest pain and palpitations.  Gastrointestinal: Positive for abdominal pain and nausea. Negative for blood in stool, constipation, diarrhea, melena and vomiting.  Genitourinary: Negative for dysuria and hematuria.  Neurological: Negative for headaches.    Family History  Problem Relation Age of Onset  . Coronary artery disease Father   . Cancer Father     unsure of type  . Other Father     Guillain- barre syndrome  . Diabetes Other     uncles   . Hypertension Mother   . Anxiety disorder Mother    . Depression Mother   . Prostate cancer Other     grandfather  . Breast cancer Sister 21  . Depression Maternal Uncle   . Colon cancer Neg Hx   . Colon polyps Neg Hx   . Rectal cancer Neg Hx   . Stomach cancer Neg Hx     Past Medical History:  Diagnosis Date  . Allergy   . Anemia    when had uterine fibroids  . Anxiety   . Cataract    very small  . Cholelithiasis   . DJD (degenerative joint disease)   . Dry eye syndrome   . Headache(784.0)   . Hyperlipidemia 01/20/2011  . Hypertension 09/29/2006  . Thrombocytopenia ([place]) 08/27/2011   hematology evaluation 08-2011, return prn  . Urticaria   . Vasomotor rhinitis     Past Surgical History:  Procedure Laterality Date  . ABDOMINAL HYSTERECTOMY     no oophorectomy  . APPENDECTOMY    . COLONOSCOPY    . DILATION AND CURETTAGE OF UTERUS    . mole removals    . TONSILLECTOMY    . UMBILICAL HERNIA REPAIR      Social History:  reports that she has never smoked. She has never used smokeless tobacco. She reports that she does not drink alcohol or use drugs.  Allergies:  Allergies  Allergen Reactions  . Bee Venom Anaphylaxis  . Doxycycline Hives and Itching  . Penicillins Itching, Nausea And Vomiting and Swelling    syncope  . Sertraline  Other (See Comments)    Dry Mouth  . Azithromycin Itching and Rash  . Cerumenex [Trolamine] Itching and Rash  . Climara [Estradiol] Itching and Rash  . Nickel Rash    Facility-Administered Medications Prior to Admission  Medication Dose Route Frequency Provider Last Rate Last Dose  . 0.9 %  sodium chloride infusion  500 mL Intravenous Continuous Jerene Bears, MD       Medications Prior to Admission  Medication Sig Dispense Refill  . aspirin 81 MG tablet Take 81 mg by mouth daily with breakfast.     . Calcium-Vitamin D (CALCIUM + D PO) Take 2 tablets by mouth daily with lunch.     . carvedilol (COREG) 6.25 MG tablet Take 1 tablet (6.25 mg total) by mouth 2 (two) times daily with a  meal. 180 tablet 3  . cetirizine (ZYRTEC) 10 MG tablet Take 1 tablet by mouth every other day.     . cycloSPORINE (RESTASIS) 0.05 % ophthalmic emulsion Place 1 drop into both eyes every 12 (twelve) hours.     Marland Kitchen dextromethorphan (DELSYM) 30 MG/5ML liquid Take 15 mg by mouth as needed for cough.    . diphenhydrAMINE (BENADRYL) 25 MG tablet Take 25 mg by mouth every 6 (six) hours as needed for allergies. Reported on 08/06/2015    . EPINEPHrine (EPIPEN 2-PAK) 0.3 mg/0.3 mL IJ SOAJ injection Inject 0.3 mLs (0.3 mg total) into the muscle once. 1 Device 1  . famotidine (PEPCID) 20 MG tablet Take 1 tablet (20 mg total) by mouth 2 (two) times daily. (Patient taking differently: Take 20 mg by mouth daily. ) 20 tablet 0  . fluticasone (FLONASE) 50 MCG/ACT nasal spray Place 1-2 sprays into both nostrils daily as needed for allergies or rhinitis.    Marland Kitchen glucosamine-chondroitin 500-400 MG tablet Take 1 tablet by mouth 3 (three) times daily.      . montelukast (SINGULAIR) 10 MG tablet Take 1 tablet (10 mg total) by mouth at bedtime.    . Multiple Vitamin (MULTIVITAMIN WITH MINERALS) TABS Take 1 tablet by mouth daily with breakfast.    . Polyethyl Glycol-Propyl Glycol (SYSTANE ULTRA) 0.4-0.3 % SOLN Place 1 drop into both eyes daily as needed (dry eyes).     Marland Kitchen spironolactone (ALDACTONE) 25 MG tablet Take 1 tablet (25 mg total) by mouth daily. 90 tablet 3    Blood pressure (!) 99/51, pulse 80, temperature 99.3 F (37.4 C), temperature source Oral, resp. rate 16, height _0  (1.575 m), weight 164 lb (74.4 kg), SpO2 97 %. Physical Exam: General: pleasant, obese AA female who is laying in bed in NAD HEENT: head is normocephalic, atraumatic. Mouth is pink and moist Heart: regular, rate, and rhythm. Palpable pedal pulses bilaterally Lungs: CTAB, no wheezes, rhonchi, or rales noted.  Respiratory effort nonlabored Abd: soft, NT/ND, +BS, previous surgical scar noted MS: all 4 extremities are symmetrical with no cyanosis,  clubbing, or edema. Skin: warm and dry with no masses, lesions, or rashes Psych: A&Ox3 with an appropriate affect. Neuro: moves all extremities spontaneously, normal speech  Results for orders placed or performed during the hospital encounter of 06/04/16 (from the past 48 hour(s))  CBC with Differential     Status: Abnormal   Collection Time: 06/04/16  5:34 PM  Result Value Ref Range   WBC 7.3 4.0 - 10.5 K/uL   RBC 5.56 (H) 3.87 - 5.11 MIL/uL   Hemoglobin 12.7 12.0 - 15.0 g/dL   HCT 39.7 36.0 - 46.0 %  MCV 71.4 (L) 78.0 - 100.0 fL   MCH 22.8 (L) 26.0 - 34.0 pg   MCHC 32.0 30.0 - 36.0 g/dL   RDW 15.3 11.5 - 15.5 %   Platelets 159 150 - 400 K/uL   Neutrophils Relative % 62 %   Neutro Abs 4.5 1.7 - 7.7 K/uL   Lymphocytes Relative 27 %   Lymphs Abs 2.0 0.7 - 4.0 K/uL   Monocytes Relative 8 %   Monocytes Absolute 0.6 0.1 - 1.0 K/uL   Eosinophils Relative 3 %   Eosinophils Absolute 0.3 0.0 - 0.7 K/uL   Basophils Relative 0 %   Basophils Absolute 0.0 0.0 - 0.1 K/uL  Comprehensive metabolic panel     Status: Abnormal   Collection Time: 06/04/16  5:34 PM  Result Value Ref Range   Sodium 141 135 - 145 mmol/L   Potassium 4.0 3.5 - 5.1 mmol/L   Chloride 105 101 - 111 mmol/L   CO2 30 22 - 32 mmol/L   Glucose, Bld 107 (H) 65 - 99 mg/dL   BUN 12 6 - 20 mg/dL   Creatinine, Ser 0.69 0.44 - 1.00 mg/dL   Calcium 9.8 8.9 - 10.3 mg/dL   Total Protein 6.8 6.5 - 8.1 g/dL   Albumin 4.0 3.5 - 5.0 g/dL   AST 297 (H) 15 - 41 U/L   ALT 138 (H) 14 - 54 U/L   Alkaline Phosphatase 66 38 - 126 U/L   Total Bilirubin 1.6 (H) 0.3 - 1.2 mg/dL   GFR calc non Af Amer >60 >60 mL/min   GFR calc Af Amer >60 >60 mL/min    Comment: (NOTE) The eGFR has been calculated using the CKD EPI equation. This calculation has not been validated in all clinical situations. eGFR's persistently <60 mL/min signify possible Chronic Kidney Disease.    Anion gap 6 5 - 15  Lipase, blood     Status: None   Collection Time:  06/04/16  5:34 PM  Result Value Ref Range   Lipase 46 11 - 51 U/L  Urinalysis, Routine w reflex microscopic     Status: None   Collection Time: 06/04/16  5:34 PM  Result Value Ref Range   Color, Urine YELLOW YELLOW   APPearance CLEAR CLEAR   Specific Gravity, Urine 1.015 1.005 - 1.030   pH 6.0 5.0 - 8.0   Glucose, UA NEGATIVE NEGATIVE mg/dL   Hgb urine dipstick NEGATIVE NEGATIVE   Bilirubin Urine NEGATIVE NEGATIVE   Ketones, ur NEGATIVE NEGATIVE mg/dL   Protein, ur NEGATIVE NEGATIVE mg/dL   Nitrite NEGATIVE NEGATIVE   Leukocytes, UA NEGATIVE NEGATIVE    Comment: Microscopic not done on urines with negative protein, blood, leukocytes, nitrite, or glucose < 500 mg/dL.  Troponin I     Status: None   Collection Time: 06/04/16  5:34 PM  Result Value Ref Range   Troponin I <0.03 <0.03 ng/mL  Surgical pcr screen     Status: None   Collection Time: 06/05/16  2:41 AM  Result Value Ref Range   MRSA, PCR NEGATIVE NEGATIVE   Staphylococcus aureus NEGATIVE NEGATIVE    Comment:        The Xpert SA Assay (FDA approved for NASAL specimens in patients over 84 years of age), is one component of a comprehensive surveillance program.  Test performance has been validated by [facility] for patients greater than or equal to 15 year old. It is not intended to diagnose infection nor to guide or monitor  treatment.   Comprehensive metabolic panel     Status: Abnormal   Collection Time: 06/05/16  4:32 AM  Result Value Ref Range   Sodium 139 135 - 145 mmol/L   Potassium 3.8 3.5 - 5.1 mmol/L   Chloride 105 101 - 111 mmol/L   CO2 27 22 - 32 mmol/L   Glucose, Bld 103 (H) 65 - 99 mg/dL   BUN 11 6 - 20 mg/dL   Creatinine, Ser 0.81 0.44 - 1.00 mg/dL   Calcium 9.2 8.9 - 10.3 mg/dL   Total Protein 6.3 (L) 6.5 - 8.1 g/dL   Albumin 3.9 3.5 - 5.0 g/dL   AST 436 (H) 15 - 41 U/L   ALT 275 (H) 14 - 54 U/L   Alkaline Phosphatase 99 38 - 126 U/L   Total Bilirubin 3.3 (H) 0.3 - 1.2 mg/dL   GFR calc non  Af Amer >60 >60 mL/min   GFR calc Af Amer >60 >60 mL/min    Comment: (NOTE) The eGFR has been calculated using the CKD EPI equation. This calculation has not been validated in all clinical situations. eGFR's persistently <60 mL/min signify possible Chronic Kidney Disease.    Anion gap 7 5 - 15  CBC     Status: Abnormal   Collection Time: 06/05/16  4:32 AM  Result Value Ref Range   WBC 8.1 4.0 - 10.5 K/uL   RBC 5.38 (H) 3.87 - 5.11 MIL/uL   Hemoglobin 12.4 12.0 - 15.0 g/dL   HCT 38.5 36.0 - 46.0 %   MCV 71.6 (L) 78.0 - 100.0 fL   MCH 23.0 (L) 26.0 - 34.0 pg   MCHC 32.2 30.0 - 36.0 g/dL   RDW 15.1 11.5 - 15.5 %   Platelets 135 (L) 150 - 400 K/uL    Comment: REPEATED TO VERIFY  Troponin I     Status: None   Collection Time: 06/05/16  4:32 AM  Result Value Ref Range   Troponin I <0.03 <0.03 ng/mL   Dg Chest 2 View  Result Date: 06/04/2016 CLINICAL DATA:  Cough with chest pain. EXAM: CHEST  2 VIEW COMPARISON:  08/17/2014 FINDINGS: The cardio pericardial silhouette is enlarged. Prominence of the thoracic aorta raises the question of thoracic aortic aneurysm. The lungs are clear bilaterally. The visualized bony structures of the thorax are intact. IMPRESSION: 1. No acute cardiopulmonary findings. 2. Cardiomegaly with thoracic aortic prominence, unchanged in the interval, but raising concern for aneurysm. CT chest could be used to further evaluate as clinically warranted. Electronically Signed   By: Misty Stanley M.D.   On: 06/04/2016 18:46   US Abdomen Complete  Result Date: 06/04/2016 CLINICAL DATA:  Midline abdominal pain radiating to central chest x1 week intermittently. Nausea intermittently. History of umbilical surgery years ago. EXAM: ABDOMEN ULTRASOUND COMPLETE COMPARISON:  CT report from 12/27/2000 FINDINGS: Gallbladder: The gallbladder is contracted. There is a gallstone in the body of the gallbladder measuring 2.6 x 1.3 x 2.1 cm. No sonographic Murphy's sign was elicited.  Gallbladder wall thickness is top normal at 3 mm. Common bile duct: Diameter: Normal at 3.6 mm Liver: Slightly coarsened and increased in echogenicity without space-occupying mass or biliary dilatation. IVC: No abnormality visualized. Pancreas: There is an ovoid 1.3 x 0.4 cm area of hypoechogenicity along the ventral pancreatic head and body. Spleen: Size and appearance within normal limits. Right Kidney: Length: 9 cm. Echogenicity within normal limits. No mass or hydronephrosis visualized. Left Kidney: Length: 9.9 cm. Echogenicity within normal limits.  No mass or hydronephrosis visualized. Abdominal aorta: No aneurysm visualized. Other findings: None. IMPRESSION: Uncomplicated cholelithiasis with a gallstone measuring 2.6 x 1.3 x 2.1 cm noted. No secondary signs of acute cholecystitis. 1.3 x 0.4 cm of hypoechogenicity along the ventral pancreatic head and body, nonspecific in etiology. CT without and with IV contrast may help for further correlation. No ductal dilatation is seen. i Electronically Signed   By: Ashley Royalty M.D.   On: 06/04/2016 21:07   Ct Angio Chest Aorta W/cm &/or Wo/cm  Result Date: 06/04/2016 CLINICAL DATA:  Chest, abdominal and back pain. EXAM: CT ANGIOGRAPHY CHEST, ABDOMEN AND PELVIS TECHNIQUE: Multidetector CT imaging through the chest, abdomen and pelvis was performed using the standard protocol during bolus administration of intravenous contrast. Multiplanar reconstructed images and MIPs were obtained and reviewed to evaluate the vascular anatomy. CONTRAST:  100 cc Isovue 370 IV COMPARISON:  Chest radiograph earlier this day. FINDINGS: CTA CHEST FINDINGS Cardiovascular: Thoracic aortic tortuosity without dissection or aneurysm. Maximal ascending aortic dimension 3.6 cm. Left vertebral artery arises directly from the aortic arch, a normal variant. No aortic hematoma. There is scattered mild atherosclerosis. Mild multi chamber cardiomegaly. Mediastinum/Nodes: No mediastinal, hilar, or  axillary adenopathy. Left axillary lymph nodes with normal fatty hila. No pericardial effusion. The esophagus is patulous. No focal abnormality in the visualized thyroid gland. Lungs/Pleura: Mild breathing motion artifact. Tiny calcified granuloma in the right lower lobe. Scattered linear atelectasis. No consolidation. No evidence pulmonary edema. No pleural fluid. Musculoskeletal: Multilevel degenerative change throughout the spine. There are no acute or suspicious osseous abnormalities. Review of the MIP images confirms the above findings. CTA ABDOMEN AND PELVIS FINDINGS VASCULAR Aorta: Normal caliber aorta without aneurysm, dissection, vasculitis or significant stenosis. Celiac: Patent without evidence of aneurysm, dissection, vasculitis or significant stenosis. SMA: Patent without evidence of aneurysm, dissection, vasculitis or significant stenosis. Renals: Both renal arteries are patent without evidence of aneurysm, dissection, vasculitis, fibromuscular dysplasia or significant stenosis. IMA: Patent without evidence of aneurysm, dissection, vasculitis or significant stenosis. Inflow: Patent without evidence of aneurysm, dissection, vasculitis or significant stenosis. Veins: No obvious venous abnormality within the limitations of this arterial phase study. Review of the MIP images confirms the above findings. NON-VASCULAR Hepatobiliary: Gallstone on ultrasound earlier this day is not visualized by CT. No abnormal gallbladder distention. Normal arterial phase appearance of the liver. Pancreas: No definite CT correlate with the hypo echogenicity adjacent of the ventral pancreatic head on ultrasound. No ductal dilatation or inflammation. Spleen: Normal arterial phase appearance. Adrenals/Urinary Tract: No adrenal nodule. No hydronephrosis or perinephric edema. Symmetric enhancement. Ureters are decompressed. Urinary bladder is mildly distended, no wall thickening. Stomach/Bowel: Significant colonic redundancy. A  few diverticula at the hepatic flexure. No colonic inflammation. No small bowel dilatation or wall thickening. Appendix is not visualized, surgically absent. Stomach is distended with ingested contents. Lymphatic: No abdominal or pelvic adenopathy. Reproductive: Status post hysterectomy. No adnexal masses. Other: No free air free fluid.  Fat containing umbilical hernia. Musculoskeletal: Degenerative change throughout the lumbar spine with predominant lower facet arthropathy. Anterolisthesis of L4 on L5 appears degenerative. There are no acute or suspicious osseous abnormalities. Review of the MIP images confirms the above findings. IMPRESSION: 1. No acute aortic abnormality. Tortuous thoracic aorta without dissection or aneurysm. 2. No acute abnormality in the chest, abdomen, or pelvis. 3. Patulous esophagus, can be seen with reflux. 4. No definite peripancreatic abnormality corresponding to the hypoechoic structure on ultrasound. Electronically Signed   By: Fonnie Birkenhead.D.  On: 06/04/2016 23:14   Ct Angio Abd/pel W/ And/or W/o  Result Date: 06/04/2016 CLINICAL DATA:  Chest, abdominal and back pain. EXAM: CT ANGIOGRAPHY CHEST, ABDOMEN AND PELVIS TECHNIQUE: Multidetector CT imaging through the chest, abdomen and pelvis was performed using the standard protocol during bolus administration of intravenous contrast. Multiplanar reconstructed images and MIPs were obtained and reviewed to evaluate the vascular anatomy. CONTRAST:  100 cc Isovue 370 IV COMPARISON:  Chest radiograph earlier this day. FINDINGS: CTA CHEST FINDINGS Cardiovascular: Thoracic aortic tortuosity without dissection or aneurysm. Maximal ascending aortic dimension 3.6 cm. Left vertebral artery arises directly from the aortic arch, a normal variant. No aortic hematoma. There is scattered mild atherosclerosis. Mild multi chamber cardiomegaly. Mediastinum/Nodes: No mediastinal, hilar, or axillary adenopathy. Left axillary lymph nodes with normal  fatty hila. No pericardial effusion. The esophagus is patulous. No focal abnormality in the visualized thyroid gland. Lungs/Pleura: Mild breathing motion artifact. Tiny calcified granuloma in the right lower lobe. Scattered linear atelectasis. No consolidation. No evidence pulmonary edema. No pleural fluid. Musculoskeletal: Multilevel degenerative change throughout the spine. There are no acute or suspicious osseous abnormalities. Review of the MIP images confirms the above findings. CTA ABDOMEN AND PELVIS FINDINGS VASCULAR Aorta: Normal caliber aorta without aneurysm, dissection, vasculitis or significant stenosis. Celiac: Patent without evidence of aneurysm, dissection, vasculitis or significant stenosis. SMA: Patent without evidence of aneurysm, dissection, vasculitis or significant stenosis. Renals: Both renal arteries are patent without evidence of aneurysm, dissection, vasculitis, fibromuscular dysplasia or significant stenosis. IMA: Patent without evidence of aneurysm, dissection, vasculitis or significant stenosis. Inflow: Patent without evidence of aneurysm, dissection, vasculitis or significant stenosis. Veins: No obvious venous abnormality within the limitations of this arterial phase study. Review of the MIP images confirms the above findings. NON-VASCULAR Hepatobiliary: Gallstone on ultrasound earlier this day is not visualized by CT. No abnormal gallbladder distention. Normal arterial phase appearance of the liver. Pancreas: No definite CT correlate with the hypo echogenicity adjacent of the ventral pancreatic head on ultrasound. No ductal dilatation or inflammation. Spleen: Normal arterial phase appearance. Adrenals/Urinary Tract: No adrenal nodule. No hydronephrosis or perinephric edema. Symmetric enhancement. Ureters are decompressed. Urinary bladder is mildly distended, no wall thickening. Stomach/Bowel: Significant colonic redundancy. A few diverticula at the hepatic flexure. No colonic  inflammation. No small bowel dilatation or wall thickening. Appendix is not visualized, surgically absent. Stomach is distended with ingested contents. Lymphatic: No abdominal or pelvic adenopathy. Reproductive: Status post hysterectomy. No adnexal masses. Other: No free air free fluid.  Fat containing umbilical hernia. Musculoskeletal: Degenerative change throughout the lumbar spine with predominant lower facet arthropathy. Anterolisthesis of L4 on L5 appears degenerative. There are no acute or suspicious osseous abnormalities. Review of the MIP images confirms the above findings. IMPRESSION: 1. No acute aortic abnormality. Tortuous thoracic aorta without dissection or aneurysm. 2. No acute abnormality in the chest, abdomen, or pelvis. 3. Patulous esophagus, can be seen with reflux. 4. No definite peripancreatic abnormality corresponding to the hypoechoic structure on ultrasound. Electronically Signed   By: Jeb Levering M.D.   On: 06/04/2016 23:14      Assessment/Plan Symptomatic cholelithiasis Transaminitis Hyperbilirubinemia   CXR 12/6 suggesting widened mediastinum - CTA negative for PE or dissection  LFTs rising today, total bilirubin 3.3 - suspect choledocholithiasis, MRCP pending, GI consulted   WBC 8.1  Abdominal pain controlled  NPO - possible procedure  Ambulate   FEN: NPO ID: none  VTE:  heparin, SCD's  Tentatively plan for laparoscopic cholecystectomy tomorrow  Jill Alexanders, [facility] Surgery 06/05/2016, 12:05 PM Pager: [phone] Consults: [phone] Mon-Fri 7:00 am-4:30 pm Sat-Sun 7:00 am-11:30 am  Procedure explained to patient.  Will proceed with lap cholecystectomy  Kaylyn Lim, MD, FACS

## 2016-06-05 NOTE — Progress Notes (Deleted)
[facility] Progress Note     Subjective: Abdominal pain improving - some radiation to both sides of her upper back intermittently. Denies fever, chills, nausea, or vomiting overnight.   Objective: Vital signs in last 24 hours: Temp:  [97.4 F (36.3 C)-99.3 F (37.4 C)] 99.3 F (37.4 C) (12/07 0523) Pulse Rate:  [74-89] 80 (12/07 0523) Resp:  [14-18] 16 (12/07 0523) BP: (99-146)/(51-78) 99/51 (12/07 0748) SpO2:  [96 %-100 %] 97 % (12/07 0748) Weight:  [164 lb (74.4 kg)-168 lb 2 oz (76.3 kg)] 164 lb (74.4 kg) (12/07 0112) Last BM Date: 06/04/16  Intake/Output from previous day: 12/06 0701 - 12/07 0700 In: 0  Out: 900 [Urine:900] Intake/Output this shift: No intake/output data recorded.  PE: Gen:  Alert, NAD, pleasant Pulm:  CTA, no W/R/R Abd: Soft, NT/ND, +BS, surgical scar from previous operation Ext:  No erythema, edema, or tenderness  Lab Results:   Recent Labs  06/04/16 1734 06/05/16 0432  WBC 7.3 8.1  HGB 12.7 12.4  HCT 39.7 38.5  PLT 159 135*   BMET  Recent Labs  06/04/16 1734 06/05/16 0432  NA 141 139  K 4.0 3.8  CL 105 105  CO2 30 27  GLUCOSE 107* 103*  BUN 12 11  CREATININE 0.69 0.81  CALCIUM 9.8 9.2   PT/INR No results for input(s): LABPROT, INR in the last 72 hours. CMP     Component Value Date/Time   NA 139 06/05/2016 0432   K 3.8 06/05/2016 0432   CL 105 06/05/2016 0432   CO2 27 06/05/2016 0432   GLUCOSE 103 (H) 06/05/2016 0432   BUN 11 06/05/2016 0432   CREATININE 0.81 06/05/2016 0432   CALCIUM 9.2 06/05/2016 0432   PROT 6.3 (L) 06/05/2016 0432   ALBUMIN 3.9 06/05/2016 0432   AST 436 (H) 06/05/2016 0432   ALT 275 (H) 06/05/2016 0432   ALKPHOS 99 06/05/2016 0432   BILITOT 3.3 (H) 06/05/2016 0432   GFRNONAA >60 06/05/2016 0432   GFRAA >60 06/05/2016 0432   Lipase     Component Value Date/Time   LIPASE 46 06/04/2016 1734       Studies/Results: Dg Chest 2 View  Result Date: 06/04/2016 CLINICAL DATA:   Cough with chest pain. EXAM: CHEST  2 VIEW COMPARISON:  08/17/2014 FINDINGS: The cardio pericardial silhouette is enlarged. Prominence of the thoracic aorta raises the question of thoracic aortic aneurysm. The lungs are clear bilaterally. The visualized bony structures of the thorax are intact. IMPRESSION: 1. No acute cardiopulmonary findings. 2. Cardiomegaly with thoracic aortic prominence, unchanged in the interval, but raising concern for aneurysm. CT chest could be used to further evaluate as clinically warranted. Electronically Signed   By: Misty Stanley M.D.   On: 06/04/2016 18:46   US Abdomen Complete  Result Date: 06/04/2016 CLINICAL DATA:  Midline abdominal pain radiating to central chest x1 week intermittently. Nausea intermittently. History of umbilical surgery years ago. EXAM: ABDOMEN ULTRASOUND COMPLETE COMPARISON:  CT report from 12/27/2000 FINDINGS: Gallbladder: The gallbladder is contracted. There is a gallstone in the body of the gallbladder measuring 2.6 x 1.3 x 2.1 cm. No sonographic Murphy's sign was elicited. Gallbladder wall thickness is top normal at 3 mm. Common bile duct: Diameter: Normal at 3.6 mm Liver: Slightly coarsened and increased in echogenicity without space-occupying mass or biliary dilatation. IVC: No abnormality visualized. Pancreas: There is an ovoid 1.3 x 0.4 cm area of hypoechogenicity along the ventral pancreatic head and body. Spleen: Size and appearance  within normal limits. Right Kidney: Length: 9 cm. Echogenicity within normal limits. No mass or hydronephrosis visualized. Left Kidney: Length: 9.9 cm. Echogenicity within normal limits. No mass or hydronephrosis visualized. Abdominal aorta: No aneurysm visualized. Other findings: None. IMPRESSION: Uncomplicated cholelithiasis with a gallstone measuring 2.6 x 1.3 x 2.1 cm noted. No secondary signs of acute cholecystitis. 1.3 x 0.4 cm of hypoechogenicity along the ventral pancreatic head and body, nonspecific in etiology.  CT without and with IV contrast may help for further correlation. No ductal dilatation is seen. i Electronically Signed   By: Ashley Royalty M.D.   On: 06/04/2016 21:07   Ct Angio Chest Aorta W/cm &/or Wo/cm  Result Date: 06/04/2016 CLINICAL DATA:  Chest, abdominal and back pain. EXAM: CT ANGIOGRAPHY CHEST, ABDOMEN AND PELVIS TECHNIQUE: Multidetector CT imaging through the chest, abdomen and pelvis was performed using the standard protocol during bolus administration of intravenous contrast. Multiplanar reconstructed images and MIPs were obtained and reviewed to evaluate the vascular anatomy. CONTRAST:  100 cc Isovue 370 IV COMPARISON:  Chest radiograph earlier this day. FINDINGS: CTA CHEST FINDINGS Cardiovascular: Thoracic aortic tortuosity without dissection or aneurysm. Maximal ascending aortic dimension 3.6 cm. Left vertebral artery arises directly from the aortic arch, a normal variant. No aortic hematoma. There is scattered mild atherosclerosis. Mild multi chamber cardiomegaly. Mediastinum/Nodes: No mediastinal, hilar, or axillary adenopathy. Left axillary lymph nodes with normal fatty hila. No pericardial effusion. The esophagus is patulous. No focal abnormality in the visualized thyroid gland. Lungs/Pleura: Mild breathing motion artifact. Tiny calcified granuloma in the right lower lobe. Scattered linear atelectasis. No consolidation. No evidence pulmonary edema. No pleural fluid. Musculoskeletal: Multilevel degenerative change throughout the spine. There are no acute or suspicious osseous abnormalities. Review of the MIP images confirms the above findings. CTA ABDOMEN AND PELVIS FINDINGS VASCULAR Aorta: Normal caliber aorta without aneurysm, dissection, vasculitis or significant stenosis. Celiac: Patent without evidence of aneurysm, dissection, vasculitis or significant stenosis. SMA: Patent without evidence of aneurysm, dissection, vasculitis or significant stenosis. Renals: Both renal arteries are patent  without evidence of aneurysm, dissection, vasculitis, fibromuscular dysplasia or significant stenosis. IMA: Patent without evidence of aneurysm, dissection, vasculitis or significant stenosis. Inflow: Patent without evidence of aneurysm, dissection, vasculitis or significant stenosis. Veins: No obvious venous abnormality within the limitations of this arterial phase study. Review of the MIP images confirms the above findings. NON-VASCULAR Hepatobiliary: Gallstone on ultrasound earlier this day is not visualized by CT. No abnormal gallbladder distention. Normal arterial phase appearance of the liver. Pancreas: No definite CT correlate with the hypo echogenicity adjacent of the ventral pancreatic head on ultrasound. No ductal dilatation or inflammation. Spleen: Normal arterial phase appearance. Adrenals/Urinary Tract: No adrenal nodule. No hydronephrosis or perinephric edema. Symmetric enhancement. Ureters are decompressed. Urinary bladder is mildly distended, no wall thickening. Stomach/Bowel: Significant colonic redundancy. A few diverticula at the hepatic flexure. No colonic inflammation. No small bowel dilatation or wall thickening. Appendix is not visualized, surgically absent. Stomach is distended with ingested contents. Lymphatic: No abdominal or pelvic adenopathy. Reproductive: Status post hysterectomy. No adnexal masses. Other: No free air free fluid.  Fat containing umbilical hernia. Musculoskeletal: Degenerative change throughout the lumbar spine with predominant lower facet arthropathy. Anterolisthesis of L4 on L5 appears degenerative. There are no acute or suspicious osseous abnormalities. Review of the MIP images confirms the above findings. IMPRESSION: 1. No acute aortic abnormality. Tortuous thoracic aorta without dissection or aneurysm. 2. No acute abnormality in the chest, abdomen, or pelvis. 3. Patulous esophagus,  can be seen with reflux. 4. No definite peripancreatic abnormality corresponding to  the hypoechoic structure on ultrasound. Electronically Signed   By: Jeb Levering M.D.   On: 06/04/2016 23:14   Ct Angio Abd/pel W/ And/or W/o  Result Date: 06/04/2016 CLINICAL DATA:  Chest, abdominal and back pain. EXAM: CT ANGIOGRAPHY CHEST, ABDOMEN AND PELVIS TECHNIQUE: Multidetector CT imaging through the chest, abdomen and pelvis was performed using the standard protocol during bolus administration of intravenous contrast. Multiplanar reconstructed images and MIPs were obtained and reviewed to evaluate the vascular anatomy. CONTRAST:  100 cc Isovue 370 IV COMPARISON:  Chest radiograph earlier this day. FINDINGS: CTA CHEST FINDINGS Cardiovascular: Thoracic aortic tortuosity without dissection or aneurysm. Maximal ascending aortic dimension 3.6 cm. Left vertebral artery arises directly from the aortic arch, a normal variant. No aortic hematoma. There is scattered mild atherosclerosis. Mild multi chamber cardiomegaly. Mediastinum/Nodes: No mediastinal, hilar, or axillary adenopathy. Left axillary lymph nodes with normal fatty hila. No pericardial effusion. The esophagus is patulous. No focal abnormality in the visualized thyroid gland. Lungs/Pleura: Mild breathing motion artifact. Tiny calcified granuloma in the right lower lobe. Scattered linear atelectasis. No consolidation. No evidence pulmonary edema. No pleural fluid. Musculoskeletal: Multilevel degenerative change throughout the spine. There are no acute or suspicious osseous abnormalities. Review of the MIP images confirms the above findings. CTA ABDOMEN AND PELVIS FINDINGS VASCULAR Aorta: Normal caliber aorta without aneurysm, dissection, vasculitis or significant stenosis. Celiac: Patent without evidence of aneurysm, dissection, vasculitis or significant stenosis. SMA: Patent without evidence of aneurysm, dissection, vasculitis or significant stenosis. Renals: Both renal arteries are patent without evidence of aneurysm, dissection, vasculitis,  fibromuscular dysplasia or significant stenosis. IMA: Patent without evidence of aneurysm, dissection, vasculitis or significant stenosis. Inflow: Patent without evidence of aneurysm, dissection, vasculitis or significant stenosis. Veins: No obvious venous abnormality within the limitations of this arterial phase study. Review of the MIP images confirms the above findings. NON-VASCULAR Hepatobiliary: Gallstone on ultrasound earlier this day is not visualized by CT. No abnormal gallbladder distention. Normal arterial phase appearance of the liver. Pancreas: No definite CT correlate with the hypo echogenicity adjacent of the ventral pancreatic head on ultrasound. No ductal dilatation or inflammation. Spleen: Normal arterial phase appearance. Adrenals/Urinary Tract: No adrenal nodule. No hydronephrosis or perinephric edema. Symmetric enhancement. Ureters are decompressed. Urinary bladder is mildly distended, no wall thickening. Stomach/Bowel: Significant colonic redundancy. A few diverticula at the hepatic flexure. No colonic inflammation. No small bowel dilatation or wall thickening. Appendix is not visualized, surgically absent. Stomach is distended with ingested contents. Lymphatic: No abdominal or pelvic adenopathy. Reproductive: Status post hysterectomy. No adnexal masses. Other: No free air free fluid.  Fat containing umbilical hernia. Musculoskeletal: Degenerative change throughout the lumbar spine with predominant lower facet arthropathy. Anterolisthesis of L4 on L5 appears degenerative. There are no acute or suspicious osseous abnormalities. Review of the MIP images confirms the above findings. IMPRESSION: 1. No acute aortic abnormality. Tortuous thoracic aorta without dissection or aneurysm. 2. No acute abnormality in the chest, abdomen, or pelvis. 3. Patulous esophagus, can be seen with reflux. 4. No definite peripancreatic abnormality corresponding to the hypoechoic structure on ultrasound. Electronically  Signed   By: Jeb Levering M.D.   On: 06/04/2016 23:14    Anti-infectives: Anti-infectives    None     Assessment/Plan Symptomatic cholelithiasis Transaminitis Hyperbilirubinemia   CXR 12/6 suggesting widened mediastinum - CTA negative for PE or dissection  LFTs rising today, total bilirubin 3.3 - suspect choledocholithiasis, MRCP pending,  GI consulted   WBC 8.1  Abdominal pain controlled  NPO - possible procedure  Ambulate   FEN: NPO ID: none  VTE: [place] heparin, SCD's  Tentatively plan for laparoscopic cholecystectomy tomorrow     LOS: 0 days    Jill Alexanders , [facility] 06/05/2016, 9:22 AM Pager: [phone] Consults: [phone] Mon-Fri 7:00 am-4:30 pm Sat-Sun 7:00 am-11:30 am

## 2016-06-05 NOTE — Progress Notes (Signed)
Patient was admitted overnight after Midnight and H and P has been reviewed and am in agreement with current Assessment and Plan. The patient is a 75 yo AAF with a PMH significant for HTN, HLD, Anemia, Diverticulitis, and other comorbitides who presented to [name] from [facility] with a chief complaint of waxing and waning Abdominal Pain which radiated into her back and chest for approximately a week. She was evaluated at PCP's office who recommended coming to the ED for evaluation. A Chest Xray was performed which showed widened mediastinum so subsequently she under went a CTA of Chest Abdomen and Pelvis which showed no dissection or Pe. Patient's  LFT's were elevated and US of the Abdomen revealed cholelithiasis without cholecystitis. Dr. Johney Maine of General Surgery was consulted and because of suspicion for choledocholithiasis, GI was consulted by General Surgery and MRCP was ordered. GI still to evaluate patient and tentative plan for General Surgery is perform laparoscopic cholecystectomy in tomorrow. Lipase level was 46. Will continue to monitor closely and repeat blood work in Am.

## 2016-06-05 NOTE — H&P (Signed)
History and Physical    Monique [name] [id number] DOB: 06/22/1941 DOA: 06/04/2016  Referring MD/NP/PA: Chesley Noon, PA-C PCP: Kathlene November, MD  Patient coming from: [facility]  Chief Complaint: Chest pain  HPI: Monique [name] is a 75 y.o. female with medical history significant of HTN, HLD, and anemia; who presented with complaints of waxing and waning abdominal pain. Symptoms had been occurring over the last week. Patient describes the pain as achy and pressure like feeling. Pain radiated to her back ,mid to upper stomach, and chest. Nothing seemed to aggravate or alleviate symptoms. Unable to correlate pain onset with eating. Patient never had previous symptoms like this in the past. Denies having any fever, chills, sweats, vomiting, loss of consciousness, dysuria, palpitations, alcohol use, or shortness of breath. No previous history of heart issues or heart disease in her family. Patient had been evaluated at her PCPs office and was recommended to come to the emergency department for further evaluation.  ED Course: Upon admission to the emergency department patient was seen have normal vital signs. Lab work revealed AST 297, ALT 138, total bilirubin 1.6. Patient underwent CT angiogram of the chest abdomen and pelvis which did not see any signs of a dissection or pulmonary embolus. Ultrasound of the abdomen revealed cholelithiasis without cholecystitis for which Dr. Johney Maine of general surgery was consulted. Patient was recommended to be transferred to a MedSurg bed at [facility].  Review of Systems: As per HPI otherwise 10 point review of systems negative.   Past Medical History:  Diagnosis Date  . Allergy   . Anemia    when had uterine fibroids  . Anxiety   . Cataract    very small  . Cholelithiasis   . DJD (degenerative joint disease)   . Dry eye syndrome   . Headache(784.0)   . Hyperlipidemia 01/20/2011  . Hypertension 09/29/2006  . Thrombocytopenia ([place]) 08/27/2011   hematology  evaluation 08-2011, return prn  . Urticaria   . Vasomotor rhinitis     Past Surgical History:  Procedure Laterality Date  . ABDOMINAL HYSTERECTOMY     no oophorectomy  . APPENDECTOMY    . COLONOSCOPY    . DILATION AND CURETTAGE OF UTERUS    . mole removals    . TONSILLECTOMY    . UMBILICAL HERNIA REPAIR       reports that she has never smoked. She has never used smokeless tobacco. She reports that she does not drink alcohol or use drugs.  Allergies  Allergen Reactions  . Bee Venom Anaphylaxis  . Doxycycline Hives and Itching  . Penicillins Itching, Nausea And Vomiting and Swelling    syncope  . Sertraline Other (See Comments)    Dry Mouth  . Azithromycin Itching and Rash  . Cerumenex [Trolamine] Itching and Rash  . Climara [Estradiol] Itching and Rash  . Nickel Rash    Family History  Problem Relation Age of Onset  . Coronary artery disease Father   . Cancer Father     unsure of type  . Other Father     Guillain- barre syndrome  . Diabetes Other     uncles   . Hypertension Mother   . Anxiety disorder Mother   . Depression Mother   . Prostate cancer Other     grandfather  . Breast cancer Sister 68  . Depression Maternal Uncle   . Colon cancer Neg Hx   . Colon polyps Neg Hx   . Rectal cancer Neg Hx   .  Stomach cancer Neg Hx     Prior to Admission medications   Medication Sig Start Date End Date Taking? Authorizing Provider  aspirin 81 MG tablet Take 81 mg by mouth daily with breakfast.    Yes Historical Provider, MD  Calcium-Vitamin D (CALCIUM + D PO) Take 2 tablets by mouth daily with lunch.    Yes Historical Provider, MD  carvedilol (COREG) 6.25 MG tablet Take 1 tablet (6.25 mg total) by mouth 2 (two) times daily with a meal. 05/26/16  Yes Colon Branch, MD  cetirizine (ZYRTEC) 10 MG tablet Take 1 tablet by mouth every other day.  11/28/14  Yes Historical Provider, MD  cycloSPORINE (RESTASIS) 0.05 % ophthalmic emulsion Place 1 drop into both eyes every 12  (twelve) hours.    Yes Historical Provider, MD  dextromethorphan (DELSYM) 30 MG/5ML liquid Take 15 mg by mouth as needed for cough.   Yes Historical Provider, MD  diphenhydrAMINE (BENADRYL) 25 MG tablet Take 25 mg by mouth every 6 (six) hours as needed for allergies. Reported on 08/06/2015   Yes Historical Provider, MD  EPINEPHrine (EPIPEN 2-PAK) 0.3 mg/0.3 mL IJ SOAJ injection Inject 0.3 mLs (0.3 mg total) into the muscle once. 11/27/15  Yes Colon Branch, MD  famotidine (PEPCID) 20 MG tablet Take 1 tablet (20 mg total) by mouth 2 (two) times daily. Patient taking differently: Take 20 mg by mouth daily.  06/19/14  Yes Rolland Porter, MD  fluticasone (FLONASE) 50 MCG/ACT nasal spray Place 1-2 sprays into both nostrils daily as needed for allergies or rhinitis.   Yes Historical Provider, MD  glucosamine-chondroitin 500-400 MG tablet Take 1 tablet by mouth 3 (three) times daily.     Yes Historical Provider, MD  montelukast (SINGULAIR) 10 MG tablet Take 1 tablet (10 mg total) by mouth at bedtime. 03/19/15  Yes Mosetta Anis, MD  Multiple Vitamin (MULTIVITAMIN WITH MINERALS) TABS Take 1 tablet by mouth daily with breakfast.   Yes Historical Provider, MD  Polyethyl Glycol-Propyl Glycol (SYSTANE ULTRA) 0.4-0.3 % SOLN Place 1 drop into both eyes daily as needed (dry eyes).    Yes Historical Provider, MD  spironolactone (ALDACTONE) 25 MG tablet Take 1 tablet (25 mg total) by mouth daily. 05/26/16  Yes Colon Branch, MD    Physical Exam:    Constitutional: Elderly female who appears in NAD, calm, but uncomfortable.  Vitals:   06/04/16 1702 06/04/16 1925 06/04/16 2211 06/05/16 0112  BP:  120/71 130/78 134/64  Pulse:  74 78 89  Resp:  18 14 16   Temp:    98.8 F (37.1 C)  TempSrc:    Oral  SpO2:  100% 100% 96%  Weight: 76.2 kg (168 lb)   74.4 kg (164 lb)  Height: 5\' 2"  (1.575 m)   5\' 2"  (1.575 m)   Eyes: PERRL, lids and conjunctivae normal ENMT: Mucous membranes are dry. Posterior pharynx clear of any exudate  or lesions. Normal dentition.  Neck: normal, supple, no masses, no thyromegaly Respiratory: clear to auscultation bilaterally, no wheezing, no crackles. Normal respiratory effort. No accessory muscle use.  Cardiovascular: Regular rate and rhythm, no murmurs / rubs / gallops. No extremity edema. 2+ pedal pulses. No carotid bruits.  Abdomen: Generalized tenderness to palpation of the right upper quadrant and epigastric region, no masses palpated. No hepatosplenomegaly. Bowel sounds positive.  Musculoskeletal: no clubbing / cyanosis. No joint deformity upper and lower extremities. Good ROM, no contractures. Normal muscle tone.  Skin: no rashes, lesions, ulcers. No  induration Neurologic: CN 2-12 grossly intact. Sensation intact, DTR normal. Strength 5/5 in all 4.  Psychiatric: Normal judgment and insight. Alert and oriented x 3. Normal mood.     Labs on Admission: I have personally reviewed following labs and imaging studies  CBC:  Recent Labs Lab 06/04/16 1734  WBC 7.3  NEUTROABS 4.5  HGB 12.7  HCT 39.7  MCV 71.4*  PLT [id number]   Basic Metabolic Panel:  Recent Labs Lab 06/04/16 1734  NA 141  K 4.0  CL 105  CO2 30  GLUCOSE 107*  BUN 12  CREATININE 0.69  CALCIUM 9.8   GFR: Estimated Creatinine Clearance: 57.4 mL/min (by C-G formula based on SCr of 0.69 mg/dL). Liver Function Tests:  Recent Labs Lab 06/04/16 1734  AST 297*  ALT 138*  ALKPHOS 66  BILITOT 1.6*  PROT 6.8  ALBUMIN 4.0    Recent Labs Lab 06/04/16 1734  LIPASE 46   No results for input(s): AMMONIA in the last 168 hours. Coagulation Profile: No results for input(s): INR, PROTIME in the last 168 hours. Cardiac Enzymes:  Recent Labs Lab 06/04/16 1734  TROPONINI <0.03   BNP (last 3 results) No results for input(s): PROBNP in the last 8760 hours. HbA1C: No results for input(s): HGBA1C in the last 72 hours. CBG: No results for input(s): GLUCAP in the last 168 hours. Lipid Profile: No results for  input(s): CHOL, HDL, LDLCALC, TRIG, CHOLHDL, LDLDIRECT in the last 72 hours. Thyroid Function Tests: No results for input(s): TSH, T4TOTAL, FREET4, T3FREE, THYROIDAB in the last 72 hours. Anemia Panel: No results for input(s): VITAMINB12, FOLATE, FERRITIN, TIBC, IRON, RETICCTPCT in the last 72 hours. Urine analysis:    Component Value Date/Time   COLORURINE YELLOW 06/04/2016 [place] 06/04/2016 1734   LABSPEC 1.015 06/04/2016 1734   PHURINE 6.0 06/04/2016 1734   GLUCOSEU NEGATIVE 06/04/2016 1734   GLUCOSEU NEGATIVE 08/29/2013 1008   HGBUR NEGATIVE 06/04/2016 1734   HGBUR large 10/08/2009 0854   BILIRUBINUR NEGATIVE 06/04/2016 1734   KETONESUR NEGATIVE 06/04/2016 1734   PROTEINUR NEGATIVE 06/04/2016 1734   UROBILINOGEN 0.2 08/29/2013 1008   NITRITE NEGATIVE 06/04/2016 1734   LEUKOCYTESUR NEGATIVE 06/04/2016 1734   Sepsis Labs: No results found for this or any previous visit (from the past 240 hour(s)).   Radiological Exams on Admission: Dg Chest 2 View  Result Date: 06/04/2016 CLINICAL DATA:  Cough with chest pain. EXAM: CHEST  2 VIEW COMPARISON:  08/17/2014 FINDINGS: The cardio pericardial silhouette is enlarged. Prominence of the thoracic aorta raises the question of thoracic aortic aneurysm. The lungs are clear bilaterally. The visualized bony structures of the thorax are intact. IMPRESSION: 1. No acute cardiopulmonary findings. 2. Cardiomegaly with thoracic aortic prominence, unchanged in the interval, but raising concern for aneurysm. CT chest could be used to further evaluate as clinically warranted. Electronically Signed   By: Misty Stanley M.D.   On: 06/04/2016 18:46   US Abdomen Complete  Result Date: 06/04/2016 CLINICAL DATA:  Midline abdominal pain radiating to central chest x1 week intermittently. Nausea intermittently. History of umbilical surgery years ago. EXAM: ABDOMEN ULTRASOUND COMPLETE COMPARISON:  CT report from 12/27/2000 FINDINGS: Gallbladder:  The gallbladder is contracted. There is a gallstone in the body of the gallbladder measuring 2.6 x 1.3 x 2.1 cm. No sonographic Murphy's sign was elicited. Gallbladder wall thickness is top normal at 3 mm. Common bile duct: Diameter: Normal at 3.6 mm Liver: Slightly coarsened and increased in echogenicity without space-occupying  mass or biliary dilatation. IVC: No abnormality visualized. Pancreas: There is an ovoid 1.3 x 0.4 cm area of hypoechogenicity along the ventral pancreatic head and body. Spleen: Size and appearance within normal limits. Right Kidney: Length: 9 cm. Echogenicity within normal limits. No mass or hydronephrosis visualized. Left Kidney: Length: 9.9 cm. Echogenicity within normal limits. No mass or hydronephrosis visualized. Abdominal aorta: No aneurysm visualized. Other findings: None. IMPRESSION: Uncomplicated cholelithiasis with a gallstone measuring 2.6 x 1.3 x 2.1 cm noted. No secondary signs of acute cholecystitis. 1.3 x 0.4 cm of hypoechogenicity along the ventral pancreatic head and body, nonspecific in etiology. CT without and with IV contrast may help for further correlation. No ductal dilatation is seen. i Electronically Signed   By: Ashley Royalty M.D.   On: 06/04/2016 21:07   Ct Angio Chest Aorta W/cm &/or Wo/cm  Result Date: 06/04/2016 CLINICAL DATA:  Chest, abdominal and back pain. EXAM: CT ANGIOGRAPHY CHEST, ABDOMEN AND PELVIS TECHNIQUE: Multidetector CT imaging through the chest, abdomen and pelvis was performed using the standard protocol during bolus administration of intravenous contrast. Multiplanar reconstructed images and MIPs were obtained and reviewed to evaluate the vascular anatomy. CONTRAST:  100 cc Isovue 370 IV COMPARISON:  Chest radiograph earlier this day. FINDINGS: CTA CHEST FINDINGS Cardiovascular: Thoracic aortic tortuosity without dissection or aneurysm. Maximal ascending aortic dimension 3.6 cm. Left vertebral artery arises directly from the aortic arch, a  normal variant. No aortic hematoma. There is scattered mild atherosclerosis. Mild multi chamber cardiomegaly. Mediastinum/Nodes: No mediastinal, hilar, or axillary adenopathy. Left axillary lymph nodes with normal fatty hila. No pericardial effusion. The esophagus is patulous. No focal abnormality in the visualized thyroid gland. Lungs/Pleura: Mild breathing motion artifact. Tiny calcified granuloma in the right lower lobe. Scattered linear atelectasis. No consolidation. No evidence pulmonary edema. No pleural fluid. Musculoskeletal: Multilevel degenerative change throughout the spine. There are no acute or suspicious osseous abnormalities. Review of the MIP images confirms the above findings. CTA ABDOMEN AND PELVIS FINDINGS VASCULAR Aorta: Normal caliber aorta without aneurysm, dissection, vasculitis or significant stenosis. Celiac: Patent without evidence of aneurysm, dissection, vasculitis or significant stenosis. SMA: Patent without evidence of aneurysm, dissection, vasculitis or significant stenosis. Renals: Both renal arteries are patent without evidence of aneurysm, dissection, vasculitis, fibromuscular dysplasia or significant stenosis. IMA: Patent without evidence of aneurysm, dissection, vasculitis or significant stenosis. Inflow: Patent without evidence of aneurysm, dissection, vasculitis or significant stenosis. Veins: No obvious venous abnormality within the limitations of this arterial phase study. Review of the MIP images confirms the above findings. NON-VASCULAR Hepatobiliary: Gallstone on ultrasound earlier this day is not visualized by CT. No abnormal gallbladder distention. Normal arterial phase appearance of the liver. Pancreas: No definite CT correlate with the hypo echogenicity adjacent of the ventral pancreatic head on ultrasound. No ductal dilatation or inflammation. Spleen: Normal arterial phase appearance. Adrenals/Urinary Tract: No adrenal nodule. No hydronephrosis or perinephric edema.  Symmetric enhancement. Ureters are decompressed. Urinary bladder is mildly distended, no wall thickening. Stomach/Bowel: Significant colonic redundancy. A few diverticula at the hepatic flexure. No colonic inflammation. No small bowel dilatation or wall thickening. Appendix is not visualized, surgically absent. Stomach is distended with ingested contents. Lymphatic: No abdominal or pelvic adenopathy. Reproductive: Status post hysterectomy. No adnexal masses. Other: No free air free fluid.  Fat containing umbilical hernia. Musculoskeletal: Degenerative change throughout the lumbar spine with predominant lower facet arthropathy. Anterolisthesis of L4 on L5 appears degenerative. There are no acute or suspicious osseous abnormalities. Review of the MIP  images confirms the above findings. IMPRESSION: 1. No acute aortic abnormality. Tortuous thoracic aorta without dissection or aneurysm. 2. No acute abnormality in the chest, abdomen, or pelvis. 3. Patulous esophagus, can be seen with reflux. 4. No definite peripancreatic abnormality corresponding to the hypoechoic structure on ultrasound. Electronically Signed   By: Jeb Levering M.D.   On: 06/04/2016 23:14   Ct Angio Abd/pel W/ And/or W/o  Result Date: 06/04/2016 CLINICAL DATA:  Chest, abdominal and back pain. EXAM: CT ANGIOGRAPHY CHEST, ABDOMEN AND PELVIS TECHNIQUE: Multidetector CT imaging through the chest, abdomen and pelvis was performed using the standard protocol during bolus administration of intravenous contrast. Multiplanar reconstructed images and MIPs were obtained and reviewed to evaluate the vascular anatomy. CONTRAST:  100 cc Isovue 370 IV COMPARISON:  Chest radiograph earlier this day. FINDINGS: CTA CHEST FINDINGS Cardiovascular: Thoracic aortic tortuosity without dissection or aneurysm. Maximal ascending aortic dimension 3.6 cm. Left vertebral artery arises directly from the aortic arch, a normal variant. No aortic hematoma. There is scattered  mild atherosclerosis. Mild multi chamber cardiomegaly. Mediastinum/Nodes: No mediastinal, hilar, or axillary adenopathy. Left axillary lymph nodes with normal fatty hila. No pericardial effusion. The esophagus is patulous. No focal abnormality in the visualized thyroid gland. Lungs/Pleura: Mild breathing motion artifact. Tiny calcified granuloma in the right lower lobe. Scattered linear atelectasis. No consolidation. No evidence pulmonary edema. No pleural fluid. Musculoskeletal: Multilevel degenerative change throughout the spine. There are no acute or suspicious osseous abnormalities. Review of the MIP images confirms the above findings. CTA ABDOMEN AND PELVIS FINDINGS VASCULAR Aorta: Normal caliber aorta without aneurysm, dissection, vasculitis or significant stenosis. Celiac: Patent without evidence of aneurysm, dissection, vasculitis or significant stenosis. SMA: Patent without evidence of aneurysm, dissection, vasculitis or significant stenosis. Renals: Both renal arteries are patent without evidence of aneurysm, dissection, vasculitis, fibromuscular dysplasia or significant stenosis. IMA: Patent without evidence of aneurysm, dissection, vasculitis or significant stenosis. Inflow: Patent without evidence of aneurysm, dissection, vasculitis or significant stenosis. Veins: No obvious venous abnormality within the limitations of this arterial phase study. Review of the MIP images confirms the above findings. NON-VASCULAR Hepatobiliary: Gallstone on ultrasound earlier this day is not visualized by CT. No abnormal gallbladder distention. Normal arterial phase appearance of the liver. Pancreas: No definite CT correlate with the hypo echogenicity adjacent of the ventral pancreatic head on ultrasound. No ductal dilatation or inflammation. Spleen: Normal arterial phase appearance. Adrenals/Urinary Tract: No adrenal nodule. No hydronephrosis or perinephric edema. Symmetric enhancement. Ureters are decompressed. Urinary  bladder is mildly distended, no wall thickening. Stomach/Bowel: Significant colonic redundancy. A few diverticula at the hepatic flexure. No colonic inflammation. No small bowel dilatation or wall thickening. Appendix is not visualized, surgically absent. Stomach is distended with ingested contents. Lymphatic: No abdominal or pelvic adenopathy. Reproductive: Status post hysterectomy. No adnexal masses. Other: No free air free fluid.  Fat containing umbilical hernia. Musculoskeletal: Degenerative change throughout the lumbar spine with predominant lower facet arthropathy. Anterolisthesis of L4 on L5 appears degenerative. There are no acute or suspicious osseous abnormalities. Review of the MIP images confirms the above findings. IMPRESSION: 1. No acute aortic abnormality. Tortuous thoracic aorta without dissection or aneurysm. 2. No acute abnormality in the chest, abdomen, or pelvis. 3. Patulous esophagus, can be seen with reflux. 4. No definite peripancreatic abnormality corresponding to the hypoechoic structure on ultrasound. Electronically Signed   By: Jeb Levering M.D.   On: 06/04/2016 23:14    EKG: Independently reviewed. Normal sinus rhythm similar to previous tracings  Assessment/Plan Cholelithiases with transaminitis and hyperbilirubinemia: Acute.Patient underwent full workup for her abdominal pain complaints which revealed no signs of PE or dissection. Ultrasound revealed cholelithiasis without acute cholecystitis. Lab work showed elevated AST, ALT, and total bilirubin. - Admit to MedSurg bed - NPO, for possible need of surgical procedure - IVF of NS at 100 ml/hr - Follow-up repeat CMP in a.m. - Morphine IV prn pain - Appreciate Dr. Johney Maine of general surgery, will follow up for further recommendation  Anemia: Hemoglobin 12.7 on admission. - Continue to monitor  Essential hypertension  - Continue spironolactone and Coreg  Allergic rhinitis - Continue Singulair, Flonase, and  Claritin  GERD - Continue Pepcid  DVT prophylaxis: Heparin   Code Status: Full Family Communication: No family present at bedside  Disposition Plan: Likely discharge home once medically stable Consults called: General surgery  Admission status: Observation MedSurg   Norval Morton MD [facility] Pager [phone]  If 7PM-7AM, please contact night-coverage [email or web address] Password [email or web address]  06/05/2016, 1:55 AM

## 2016-06-05 NOTE — Consult Note (Addendum)
Referring Provider: [facility] Primary Care Physician:  Kathlene November, MD Primary Gastroenterologist:  Dr. Hilarie Fredrickson  Reason for Consultation:  Elevated LFT's, abdominal pain  HPI: Monique [name] is a 75 y.o. female with PMH of diverticulitis, HTN, HLD, and anxiety who presented to [name] from PCP with intermitted upper abdominal pain that radiates to her back/right shoulder for 1 week. She also endorses nausea and chest discomfort/pressure. Pain rated as 5/10 at the worse. Tried taking aspirin with no relief.  Pain does not seem to be associated with eating.  There are times throughout this week that she does not have any pain and she does not have any abdominal pain currently.  Denies fever, changes in bowel habits or urinary symptoms.  She denies heartburn/reflux/indigestino.  Denies use of blood thinning medications. Previous abdominal surgeries include umblical hernia repair, appendectomy, and abdominal hysterectomy.   ED workup significant for a gallstone on RUQ ultrasound, CTA neg for acute aortic pathology, WBC WNL (see results below).  LFT's slightly elevated on admission with AST 297, ALT 138, ALP 66, and total bili 1.6.  Today LFT's increased to AST 436, ALT 275, and total bili 3.3.  ALP remains normal.  MRCP negative for CBD stones.  Surgery tentatively planning for OR with lap chole tomorrow, 12/8, but are not completely convinced that this is biliary pain and wanted GI input before proceeding with surgery, so GI consult was placed.  LFT's (transaminases) completely normal in August.   Past Medical History:  Diagnosis Date  . Allergy   . Anemia    when had uterine fibroids  . Anxiety   . Cataract    very small  . Cholelithiasis   . DJD (degenerative joint disease)   . Dry eye syndrome   . Headache(784.0)   . Hyperlipidemia 01/20/2011  . Hypertension 09/29/2006  . Thrombocytopenia ([place]) 08/27/2011   hematology evaluation 08-2011, return prn  . Urticaria   . Vasomotor rhinitis       Past Surgical History:  Procedure Laterality Date  . ABDOMINAL HYSTERECTOMY     no oophorectomy  . APPENDECTOMY    . COLONOSCOPY    . DILATION AND CURETTAGE OF UTERUS    . mole removals    . TONSILLECTOMY    . UMBILICAL HERNIA REPAIR      Prior to Admission medications   Medication Sig Start Date End Date Taking? Authorizing Provider  aspirin 81 MG tablet Take 81 mg by mouth daily with breakfast.    Yes Historical Provider, MD  Calcium-Vitamin D (CALCIUM + D PO) Take 2 tablets by mouth daily with lunch.    Yes Historical Provider, MD  carvedilol (COREG) 6.25 MG tablet Take 1 tablet (6.25 mg total) by mouth 2 (two) times daily with a meal. 05/26/16  Yes Colon Branch, MD  cetirizine (ZYRTEC) 10 MG tablet Take 1 tablet by mouth every other day.  11/28/14  Yes Historical Provider, MD  cycloSPORINE (RESTASIS) 0.05 % ophthalmic emulsion Place 1 drop into both eyes every 12 (twelve) hours.    Yes Historical Provider, MD  dextromethorphan (DELSYM) 30 MG/5ML liquid Take 15 mg by mouth as needed for cough.   Yes Historical Provider, MD  diphenhydrAMINE (BENADRYL) 25 MG tablet Take 25 mg by mouth every 6 (six) hours as needed for allergies. Reported on 08/06/2015   Yes Historical Provider, MD  EPINEPHrine (EPIPEN 2-PAK) 0.3 mg/0.3 mL IJ SOAJ injection Inject 0.3 mLs (0.3 mg total) into the muscle once. 11/27/15  Yes Colon Branch, MD  famotidine (PEPCID) 20 MG tablet Take 1 tablet (20 mg total) by mouth 2 (two) times daily. Patient taking differently: Take 20 mg by mouth daily.  06/19/14  Yes Rolland Porter, MD  fluticasone (FLONASE) 50 MCG/ACT nasal spray Place 1-2 sprays into both nostrils daily as needed for allergies or rhinitis.   Yes Historical Provider, MD  glucosamine-chondroitin 500-400 MG tablet Take 1 tablet by mouth 3 (three) times daily.     Yes Historical Provider, MD  montelukast (SINGULAIR) 10 MG tablet Take 1 tablet (10 mg total) by mouth at bedtime. 03/19/15  Yes Mosetta Anis, MD  Multiple  Vitamin (MULTIVITAMIN WITH MINERALS) TABS Take 1 tablet by mouth daily with breakfast.   Yes Historical Provider, MD  Polyethyl Glycol-Propyl Glycol (SYSTANE ULTRA) 0.4-0.3 % SOLN Place 1 drop into both eyes daily as needed (dry eyes).    Yes Historical Provider, MD  spironolactone (ALDACTONE) 25 MG tablet Take 1 tablet (25 mg total) by mouth daily. 05/26/16  Yes Colon Branch, MD    Current Facility-Administered Medications  Medication Dose Route Frequency Provider Last Rate Last Dose  . 0.9 %  sodium chloride infusion   Intravenous Continuous Norval Morton, MD 100 mL/hr at 06/05/16 1509 100 mL/hr at 06/05/16 1509  . albuterol (PROVENTIL) (2.5 MG/3ML) 0.083% nebulizer solution 2.5 mg  2.5 mg Nebulization Q2H PRN Norval Morton, MD      . aspirin EC tablet 81 mg  81 mg Oral Q breakfast Rondell A Tamala Julian, MD      . carvedilol (COREG) tablet 6.25 mg  6.25 mg Oral BID WC Rondell A Tamala Julian, MD      . cycloSPORINE (RESTASIS) 0.05 % ophthalmic emulsion 1 drop  1 drop Both Eyes Q12H Rondell Charmayne Sheer, MD   1 drop at 06/05/16 1025  . diphenhydrAMINE (BENADRYL) capsule 25 mg  25 mg Oral Q6H PRN Norval Morton, MD      . famotidine (PEPCID) tablet 20 mg  20 mg Oral Daily Rondell A Tamala Julian, MD      . fluticasone (FLONASE) 50 MCG/ACT nasal spray 1-2 spray  1-2 spray Each Nare Daily PRN Norval Morton, MD      . gadobenate dimeglumine (MULTIHANCE) injection 20 mL  20 mL Intravenous Once PRN Rodessa, DO      . heparin injection 5,000 Units  5,000 Units Subcutaneous Q8H Norval Morton, MD   5,000 Units at 06/05/16 1509  . loratadine (CLARITIN) tablet 10 mg  10 mg Oral Daily Rondell A Tamala Julian, MD      . montelukast (SINGULAIR) tablet 10 mg  10 mg Oral QHS Rondell A Tamala Julian, MD      . morphine 2 MG/ML injection 1-2 mg  1-2 mg Intravenous Q3H PRN Norval Morton, MD   1 mg at 06/05/16 0751  . ondansetron (ZOFRAN) tablet 4 mg  4 mg Oral Q6H PRN Norval Morton, MD       Or  . ondansetron (ZOFRAN) injection 4 mg   4 mg Intravenous Q6H PRN Rondell A Tamala Julian, MD      . polyvinyl alcohol (LIQUIFILM TEARS) 1.4 % ophthalmic solution 1 drop  1 drop Both Eyes Daily PRN Norval Morton, MD      . spironolactone (ALDACTONE) tablet 25 mg  25 mg Oral Daily Norval Morton, MD        Allergies as of 06/04/2016 - Review Complete 06/04/2016  Allergen Reaction Noted  .  Bee venom Anaphylaxis 12/01/2011  . Doxycycline Hives and Itching 08/23/2007  . Penicillins Itching, Nausea And Vomiting, and Swelling 08/23/2007  . Sertraline Other (See Comments) 02/23/2014  . Azithromycin Itching and Rash 08/23/2007  . Cerumenex [trolamine] Itching and Rash 12/01/2011  . Climara [estradiol] Itching and Rash 12/01/2011  . Nickel Rash 06/13/2014    Family History  Problem Relation Age of Onset  . Coronary artery disease Father   . Cancer Father     unsure of type  . Other Father     Guillain- barre syndrome  . Diabetes Other     uncles   . Hypertension Mother   . Anxiety disorder Mother   . Depression Mother   . Prostate cancer Other     grandfather  . Breast cancer Sister 10  . Depression Maternal Uncle   . Colon cancer Neg Hx   . Colon polyps Neg Hx   . Rectal cancer Neg Hx   . Stomach cancer Neg Hx     Social History   Social History  . Marital status: Widowed    Spouse name: N/A  . Number of children: 2  . Years of education: N/A   Occupational History  . 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 Smoking status: Never Smoker  . Smokeless tobacco: Never Used  . Alcohol use No  . Drug use: No  . Sexual activity: Not on file   Other Topics Concern  . Not on file   Social History Narrative   husband had prostate ca , passed away 10-12-13   takes care of mom, 57 y/o who is blind , and two other fam members            Review of Systems: ROS negative except as mentioned in HPI.  Physical Exam: Vital signs in last 24 hours: Temp:  [98.1 F (36.7 C)-99.3 F (37.4 C)]  99.3 F (37.4 C) (12/07 0523) Pulse Rate:  [74-89] 80 (12/07 0523) Resp:  [14-18] 16 (12/07 0523) BP: (99-146)/(51-78) 99/51 (12/07 0748) SpO2:  [96 %-100 %] 97 % (12/07 0748) Weight:  [164 lb (74.4 kg)-168 lb (76.2 kg)] 164 lb (74.4 kg) (12/07 0112) Last BM Date: 06/04/16 General:  Alert, Well-developed, well-nourished, pleasant and cooperative in NAD Head:  Normocephalic and atraumatic. Eyes:  Mild scleral icterus present. Ears:  Normal auditory acuity. Mouth:  No deformity or lesions.   Lungs:  Clear throughout to auscultation.   No wheezes, crackles, or rhonchi.  No increased WOB. Heart:  Regular rate and rhythm; no murmurs, clicks, rubs, or gallops. Abdomen:  Soft, non-distended.  BS present.  Non-tender. Rectal:  Deferred  Msk:  Symmetrical without gross deformities. Pulses:  Normal pulses noted. Extremities:  Without clubbing or edema. Neurologic:  Alert and oriented x 4;  grossly normal neurologically. Skin:  Intact without significant lesions or rashes. Psych:  Alert and cooperative. Normal mood and affect.  Intake/Output from previous day: 12/06 0701 - 12/07 0700 In: 0  Out: 900 [Urine:900] Intake/Output this shift: Total I/O In: 623.3 [I.V.:623.3] Out: 0   Lab Results:  Recent Labs  06/04/16 1734 06/05/16 0432  WBC 7.3 8.1  HGB 12.7 12.4  HCT 39.7 38.5  PLT 159 135*   BMET  Recent Labs  06/04/16 1734 06/05/16 0432  NA 141 139  K 4.0 3.8  CL 105 105  CO2 30 27  GLUCOSE 107* 103*  BUN 12 11  CREATININE 0.69 0.81  CALCIUM 9.8 9.2   LFT  Recent Labs  06/05/16 0432  PROT 6.3*  ALBUMIN 3.9  AST 436*  ALT 275*  ALKPHOS 99  BILITOT 3.3*   Studies/Results: Dg Chest 2 View  Result Date: 06/04/2016 CLINICAL DATA:  Cough with chest pain. EXAM: CHEST  2 VIEW COMPARISON:  08/17/2014 FINDINGS: The cardio pericardial silhouette is enlarged. Prominence of the thoracic aorta raises the question of thoracic aortic aneurysm. The lungs are clear  bilaterally. The visualized bony structures of the thorax are intact. IMPRESSION: 1. No acute cardiopulmonary findings. 2. Cardiomegaly with thoracic aortic prominence, unchanged in the interval, but raising concern for aneurysm. CT chest could be used to further evaluate as clinically warranted. Electronically Signed   By: Misty Stanley M.D.   On: 06/04/2016 18:46   US Abdomen Complete  Result Date: 06/04/2016 CLINICAL DATA:  Midline abdominal pain radiating to central chest x1 week intermittently. Nausea intermittently. History of umbilical surgery years ago. EXAM: ABDOMEN ULTRASOUND COMPLETE COMPARISON:  CT report from 12/27/2000 FINDINGS: Gallbladder: The gallbladder is contracted. There is a gallstone in the body of the gallbladder measuring 2.6 x 1.3 x 2.1 cm. No sonographic Murphy's sign was elicited. Gallbladder wall thickness is top normal at 3 mm. Common bile duct: Diameter: Normal at 3.6 mm Liver: Slightly coarsened and increased in echogenicity without space-occupying mass or biliary dilatation. IVC: No abnormality visualized. Pancreas: There is an ovoid 1.3 x 0.4 cm area of hypoechogenicity along the ventral pancreatic head and body. Spleen: Size and appearance within normal limits. Right Kidney: Length: 9 cm. Echogenicity within normal limits. No mass or hydronephrosis visualized. Left Kidney: Length: 9.9 cm. Echogenicity within normal limits. No mass or hydronephrosis visualized. Abdominal aorta: No aneurysm visualized. Other findings: None. IMPRESSION: Uncomplicated cholelithiasis with a gallstone measuring 2.6 x 1.3 x 2.1 cm noted. No secondary signs of acute cholecystitis. 1.3 x 0.4 cm of hypoechogenicity along the ventral pancreatic head and body, nonspecific in etiology. CT without and with IV contrast may help for further correlation. No ductal dilatation is seen. i Electronically Signed   By: Ashley Royalty M.D.   On: 06/04/2016 21:07   Mr 3d Recon At Scanner  Result Date:  06/05/2016 CLINICAL DATA:  Cholelithiasis with elevated trans am an Ace levels and hyperbilirubinemia. EXAM: MRI ABDOMEN WITHOUT AND WITH CONTRAST (INCLUDING MRCP) TECHNIQUE: Multiplanar multisequence MR imaging of the abdomen was performed both before and after the administration of intravenous contrast. Heavily T2-weighted images of the biliary and pancreatic ducts were obtained, and three-dimensional MRCP images were rendered by post processing. CONTRAST:  15 cc MultiHance COMPARISON:  CT scans from 06/04/2016 FINDINGS: Despite efforts by the technologist and patient, motion artifact is present on today's exam and could not be eliminated. This reduces exam sensitivity and specificity. Lower chest: Mild cardiomegaly. Hepatobiliary: 2.4 cm in long axis gallstone in the gallbladder on image 23/3, along with some dependent low T2 signal material in the gallbladder probably sludge, less likely tiny gallstones. No intrahepatic biliary dilatation. No extrahepatic biliary dilatation. No filling defect in the intrahepatic or extrahepatic bile ducts is identified. No abnormal hepatic enhancement or hepatic morphology identified. No significant gallbladder wall thickening or pericholecystic fluid. Pancreas: Prior hypoechogenicity along the ventral pancreatic head and body was raised as a concern on the prior ultrasound; there is no worrisome lesion in this vicinity on today' s MRI, or on the prior CT, and the sonographic appearance was likely spurious. Spleen:  Unremarkable Adrenals/Urinary Tract:  Several tiny nonenhancing lesions in the kidneys favor small cysts. No worrisome renal enhancement. Stomach/Bowel: Nondistended stomach. No significant abnormality observed. Vascular/Lymphatic:  Unremarkable Other:  No supplemental non-categorized findings. Musculoskeletal: There is a suggestion of grade 1 anterolisthesis at L4-5 on one of the scout images. IMPRESSION: 1. Gallstone noted in the gallbladder but no intrahepatic or  extrahepatic biliary abnormality is identified. No focal lesion in the liver. 2. No pancreatic lesion observed. 3. Grade 1 anterolisthesis at L4-5. 4. Mild cardiomegaly. 5. An obstructive cause for the patient's elevated liver enzymes and elevated bilirubin is not identified. If workup for hepatocellular dysfunction is warranted, nuclear medicine hepatobiliary scan can sometimes be helpful. Electronically Signed   By: Van Clines M.D.   On: 06/05/2016 15:17   Mr Abdomen Mrcp Moise Boring Contast  Result Date: 06/05/2016 CLINICAL DATA:  Cholelithiasis with elevated trans am an Ace levels and hyperbilirubinemia. EXAM: MRI ABDOMEN WITHOUT AND WITH CONTRAST (INCLUDING MRCP) TECHNIQUE: Multiplanar multisequence MR imaging of the abdomen was performed both before and after the administration of intravenous contrast. Heavily T2-weighted images of the biliary and pancreatic ducts were obtained, and three-dimensional MRCP images were rendered by post processing. CONTRAST:  15 cc MultiHance COMPARISON:  CT scans from 06/04/2016 FINDINGS: Despite efforts by the technologist and patient, motion artifact is present on today's exam and could not be eliminated. This reduces exam sensitivity and specificity. Lower chest: Mild cardiomegaly. Hepatobiliary: 2.4 cm in long axis gallstone in the gallbladder on image 23/3, along with some dependent low T2 signal material in the gallbladder probably sludge, less likely tiny gallstones. No intrahepatic biliary dilatation. No extrahepatic biliary dilatation. No filling defect in the intrahepatic or extrahepatic bile ducts is identified. No abnormal hepatic enhancement or hepatic morphology identified. No significant gallbladder wall thickening or pericholecystic fluid. Pancreas: Prior hypoechogenicity along the ventral pancreatic head and body was raised as a concern on the prior ultrasound; there is no worrisome lesion in this vicinity on today' s MRI, or on the prior CT, and the  sonographic appearance was likely spurious. Spleen:  Unremarkable Adrenals/Urinary Tract: Several tiny nonenhancing lesions in the kidneys favor small cysts. No worrisome renal enhancement. Stomach/Bowel: Nondistended stomach. No significant abnormality observed. Vascular/Lymphatic:  Unremarkable Other:  No supplemental non-categorized findings. Musculoskeletal: There is a suggestion of grade 1 anterolisthesis at L4-5 on one of the scout images. IMPRESSION: 1. Gallstone noted in the gallbladder but no intrahepatic or extrahepatic biliary abnormality is identified. No focal lesion in the liver. 2. No pancreatic lesion observed. 3. Grade 1 anterolisthesis at L4-5. 4. Mild cardiomegaly. 5. An obstructive cause for the patient's elevated liver enzymes and elevated bilirubin is not identified. If workup for hepatocellular dysfunction is warranted, nuclear medicine hepatobiliary scan can sometimes be helpful. Electronically Signed   By: Van Clines M.D.   On: 06/05/2016 15:17   Ct Angio Chest Aorta W/cm &/or Wo/cm  Result Date: 06/04/2016 CLINICAL DATA:  Chest, abdominal and back pain. EXAM: CT ANGIOGRAPHY CHEST, ABDOMEN AND PELVIS TECHNIQUE: Multidetector CT imaging through the chest, abdomen and pelvis was performed using the standard protocol during bolus administration of intravenous contrast. Multiplanar reconstructed images and MIPs were obtained and reviewed to evaluate the vascular anatomy. CONTRAST:  100 cc Isovue 370 IV COMPARISON:  Chest radiograph earlier this day. FINDINGS: CTA CHEST FINDINGS Cardiovascular: Thoracic aortic tortuosity without dissection or aneurysm. Maximal ascending aortic dimension 3.6 cm. Left vertebral artery arises directly from the aortic arch, a normal variant. No aortic hematoma. There is scattered  mild atherosclerosis. Mild multi chamber cardiomegaly. Mediastinum/Nodes: No mediastinal, hilar, or axillary adenopathy. Left axillary lymph nodes with normal fatty hila. No  pericardial effusion. The esophagus is patulous. No focal abnormality in the visualized thyroid gland. Lungs/Pleura: Mild breathing motion artifact. Tiny calcified granuloma in the right lower lobe. Scattered linear atelectasis. No consolidation. No evidence pulmonary edema. No pleural fluid. Musculoskeletal: Multilevel degenerative change throughout the spine. There are no acute or suspicious osseous abnormalities. Review of the MIP images confirms the above findings. CTA ABDOMEN AND PELVIS FINDINGS VASCULAR Aorta: Normal caliber aorta without aneurysm, dissection, vasculitis or significant stenosis. Celiac: Patent without evidence of aneurysm, dissection, vasculitis or significant stenosis. SMA: Patent without evidence of aneurysm, dissection, vasculitis or significant stenosis. Renals: Both renal arteries are patent without evidence of aneurysm, dissection, vasculitis, fibromuscular dysplasia or significant stenosis. IMA: Patent without evidence of aneurysm, dissection, vasculitis or significant stenosis. Inflow: Patent without evidence of aneurysm, dissection, vasculitis or significant stenosis. Veins: No obvious venous abnormality within the limitations of this arterial phase study. Review of the MIP images confirms the above findings. NON-VASCULAR Hepatobiliary: Gallstone on ultrasound earlier this day is not visualized by CT. No abnormal gallbladder distention. Normal arterial phase appearance of the liver. Pancreas: No definite CT correlate with the hypo echogenicity adjacent of the ventral pancreatic head on ultrasound. No ductal dilatation or inflammation. Spleen: Normal arterial phase appearance. Adrenals/Urinary Tract: No adrenal nodule. No hydronephrosis or perinephric edema. Symmetric enhancement. Ureters are decompressed. Urinary bladder is mildly distended, no wall thickening. Stomach/Bowel: Significant colonic redundancy. A few diverticula at the hepatic flexure. No colonic inflammation. No small  bowel dilatation or wall thickening. Appendix is not visualized, surgically absent. Stomach is distended with ingested contents. Lymphatic: No abdominal or pelvic adenopathy. Reproductive: Status post hysterectomy. No adnexal masses. Other: No free air free fluid.  Fat containing umbilical hernia. Musculoskeletal: Degenerative change throughout the lumbar spine with predominant lower facet arthropathy. Anterolisthesis of L4 on L5 appears degenerative. There are no acute or suspicious osseous abnormalities. Review of the MIP images confirms the above findings. IMPRESSION: 1. No acute aortic abnormality. Tortuous thoracic aorta without dissection or aneurysm. 2. No acute abnormality in the chest, abdomen, or pelvis. 3. Patulous esophagus, can be seen with reflux. 4. No definite peripancreatic abnormality corresponding to the hypoechoic structure on ultrasound. Electronically Signed   By: Jeb Levering M.D.   On: 06/04/2016 23:14   Ct Angio Abd/pel W/ And/or W/o  Result Date: 06/04/2016 CLINICAL DATA:  Chest, abdominal and back pain. EXAM: CT ANGIOGRAPHY CHEST, ABDOMEN AND PELVIS TECHNIQUE: Multidetector CT imaging through the chest, abdomen and pelvis was performed using the standard protocol during bolus administration of intravenous contrast. Multiplanar reconstructed images and MIPs were obtained and reviewed to evaluate the vascular anatomy. CONTRAST:  100 cc Isovue 370 IV COMPARISON:  Chest radiograph earlier this day. FINDINGS: CTA CHEST FINDINGS Cardiovascular: Thoracic aortic tortuosity without dissection or aneurysm. Maximal ascending aortic dimension 3.6 cm. Left vertebral artery arises directly from the aortic arch, a normal variant. No aortic hematoma. There is scattered mild atherosclerosis. Mild multi chamber cardiomegaly. Mediastinum/Nodes: No mediastinal, hilar, or axillary adenopathy. Left axillary lymph nodes with normal fatty hila. No pericardial effusion. The esophagus is patulous. No focal  abnormality in the visualized thyroid gland. Lungs/Pleura: Mild breathing motion artifact. Tiny calcified granuloma in the right lower lobe. Scattered linear atelectasis. No consolidation. No evidence pulmonary edema. No pleural fluid. Musculoskeletal: Multilevel degenerative change throughout the spine. There are no acute or suspicious osseous  abnormalities. Review of the MIP images confirms the above findings. CTA ABDOMEN AND PELVIS FINDINGS VASCULAR Aorta: Normal caliber aorta without aneurysm, dissection, vasculitis or significant stenosis. Celiac: Patent without evidence of aneurysm, dissection, vasculitis or significant stenosis. SMA: Patent without evidence of aneurysm, dissection, vasculitis or significant stenosis. Renals: Both renal arteries are patent without evidence of aneurysm, dissection, vasculitis, fibromuscular dysplasia or significant stenosis. IMA: Patent without evidence of aneurysm, dissection, vasculitis or significant stenosis. Inflow: Patent without evidence of aneurysm, dissection, vasculitis or significant stenosis. Veins: No obvious venous abnormality within the limitations of this arterial phase study. Review of the MIP images confirms the above findings. NON-VASCULAR Hepatobiliary: Gallstone on ultrasound earlier this day is not visualized by CT. No abnormal gallbladder distention. Normal arterial phase appearance of the liver. Pancreas: No definite CT correlate with the hypo echogenicity adjacent of the ventral pancreatic head on ultrasound. No ductal dilatation or inflammation. Spleen: Normal arterial phase appearance. Adrenals/Urinary Tract: No adrenal nodule. No hydronephrosis or perinephric edema. Symmetric enhancement. Ureters are decompressed. Urinary bladder is mildly distended, no wall thickening. Stomach/Bowel: Significant colonic redundancy. A few diverticula at the hepatic flexure. No colonic inflammation. No small bowel dilatation or wall thickening. Appendix is not  visualized, surgically absent. Stomach is distended with ingested contents. Lymphatic: No abdominal or pelvic adenopathy. Reproductive: Status post hysterectomy. No adnexal masses. Other: No free air free fluid.  Fat containing umbilical hernia. Musculoskeletal: Degenerative change throughout the lumbar spine with predominant lower facet arthropathy. Anterolisthesis of L4 on L5 appears degenerative. There are no acute or suspicious osseous abnormalities. Review of the MIP images confirms the above findings. IMPRESSION: 1. No acute aortic abnormality. Tortuous thoracic aorta without dissection or aneurysm. 2. No acute abnormality in the chest, abdomen, or pelvis. 3. Patulous esophagus, can be seen with reflux. 4. No definite peripancreatic abnormality corresponding to the hypoechoic structure on ultrasound. Electronically Signed   By: Jeb Levering M.D.   On: 06/04/2016 23:14   IMPRESSION:  -75 year old female with intermittent upper abdominal pain, some pain into her right shoulder, and nausea for the past week.  LFT's elevated and trended up today.  Ultrasound and CT scan showed gallstones, no other cause.  MRCP negative for CBD stone.    PLAN: -Esophagram ordered by surgery.  We have canceled this test for now.  We think that it is unlikely that she would have something esophageal causing her pain as well as something else causing her elevated LFT's at the same time.  Also, the barium is very dense and may impede necessary testing if she were to proceed for lap chole/IOC and then if has a positive IOC and would need ERCP.  Although this could be cleared with Miralax if absolutely necessary. -We think that HIDA scan would be reasonable but pain certainly sounds like it could be biliary in nature and lap chole with IOC would also be a good plan.  -Will order viral hepatitis studies as well just to be sure.   ZEHR, JESSICA D.  06/05/2016, 4:12 PM  Pager number [phone]    Attending physician's note     I have taken a history, examined the patient and reviewed the chart. I agree with the Advanced Practitioner's note, impression and recommendations.  Upper abdominal pain, radiating the shoulder/back with cholelithiasis on [place] and elevated LFTs. Symptomatic cholelithiasis/cholecystitis with a reactive hepatitis is the leading diagnosis.  MRCP did not show any biliary abnormality: no CBD stone, no biliary dilation.  We cancelled her barium  esophagram to avoid residual barium in the bowel interfering with an optimal IOC.  Check viral hepatitis serologies however this diagnosis is unlikely.  Lap chole with IOC seems appropriate - decision per gen surgery.  HIDA might be helpful however with rising bilirubin hepatic uptake and biliary excretion might be impaired - decision per gen surgery.   Lucio Edward, MD [facility] [phone] Mon-Fri 8a-5p [phone] after 5p, weekends, holidays

## 2016-06-06 ENCOUNTER — Encounter (HOSPITAL_COMMUNITY)
Admission: EM | Disposition: A | Discharge status: Home / Self Care | Payer: Self-pay | Source: Home / Self Care | Attending: Internal Medicine

## 2016-06-06 ENCOUNTER — Inpatient Hospital Stay (HOSPITAL_COMMUNITY): Payer: Medicare Other | Admitting: Registered Nurse

## 2016-06-06 ENCOUNTER — Encounter (HOSPITAL_COMMUNITY): Payer: Self-pay | Admitting: Registered Nurse

## 2016-06-06 ENCOUNTER — Inpatient Hospital Stay (HOSPITAL_COMMUNITY): Payer: Medicare Other

## 2016-06-06 DIAGNOSIS — R748 Abnormal levels of other serum enzymes: Secondary | ICD-10-CM

## 2016-06-06 DIAGNOSIS — K8010 Calculus of gallbladder with chronic cholecystitis without obstruction: Secondary | ICD-10-CM

## 2016-06-06 DIAGNOSIS — J309 Allergic rhinitis, unspecified: Secondary | ICD-10-CM

## 2016-06-06 DIAGNOSIS — R109 Unspecified abdominal pain: Secondary | ICD-10-CM

## 2016-06-06 DIAGNOSIS — K8020 Calculus of gallbladder without cholecystitis without obstruction: Secondary | ICD-10-CM | POA: Diagnosis not present

## 2016-06-06 DIAGNOSIS — R740 Nonspecific elevation of levels of transaminase and lactic acid dehydrogenase [LDH]: Secondary | ICD-10-CM | POA: Diagnosis not present

## 2016-06-06 HISTORY — PX: CHOLECYSTECTOMY: SHX55

## 2016-06-06 LAB — CBC WITH DIFFERENTIAL/PLATELET
Basophils Absolute: 0 10*3/uL (ref 0.0–0.1)
Basophils Relative: 1 %
Eosinophils Absolute: 0.3 10*3/uL (ref 0.0–0.7)
Eosinophils Relative: 7 %
HCT: 36.3 % (ref 36.0–46.0)
Hemoglobin: 11.7 g/dL — ABNORMAL LOW (ref 12.0–15.0)
Lymphocytes Relative: 38 %
Lymphs Abs: 1.7 10*3/uL (ref 0.7–4.0)
MCH: 23.2 pg — ABNORMAL LOW (ref 26.0–34.0)
MCHC: 32.2 g/dL (ref 30.0–36.0)
MCV: 72 fL — ABNORMAL LOW (ref 78.0–100.0)
Monocytes Absolute: 0.3 10*3/uL (ref 0.1–1.0)
Monocytes Relative: 7 %
Neutro Abs: 2.2 10*3/uL (ref 1.7–7.7)
Neutrophils Relative %: 48 %
Platelets: 127 10*3/uL — ABNORMAL LOW (ref 150–400)
RBC: 5.04 MIL/uL (ref 3.87–5.11)
RDW: 15.6 % — ABNORMAL HIGH (ref 11.5–15.5)
WBC: 4.5 10*3/uL (ref 4.0–10.5)

## 2016-06-06 LAB — COMPREHENSIVE METABOLIC PANEL
ALT: 228 U/L — ABNORMAL HIGH (ref 14–54)
AST: 199 U/L — ABNORMAL HIGH (ref 15–41)
Albumin: 3.5 g/dL (ref 3.5–5.0)
Alkaline Phosphatase: 109 U/L (ref 38–126)
Anion gap: 6 (ref 5–15)
BUN: 8 mg/dL (ref 6–20)
CO2: 26 mmol/L (ref 22–32)
Calcium: 8.4 mg/dL — ABNORMAL LOW (ref 8.9–10.3)
Chloride: 110 mmol/L (ref 101–111)
Creatinine, Ser: 0.76 mg/dL (ref 0.44–1.00)
GFR calc Af Amer: >60 mL/min (ref >60)
GFR calc non Af Amer: >60 mL/min (ref >60)
Glucose, Bld: 89 mg/dL (ref 65–99)
Potassium: 3.5 mmol/L (ref 3.5–5.1)
Sodium: 142 mmol/L (ref 135–145)
Total Bilirubin: 2.8 mg/dL — ABNORMAL HIGH (ref 0.3–1.2)
Total Protein: 5.7 g/dL — ABNORMAL LOW (ref 6.5–8.1)

## 2016-06-06 LAB — HEPATITIS PANEL, ACUTE
HCV Ab: <0.1 s/co ratio (ref 0.0–0.9)
Hep A IgM: NEGATIVE
Hep B C IgM: NEGATIVE
Hepatitis B Surface Ag: NEGATIVE

## 2016-06-06 LAB — MAGNESIUM: Magnesium: 1.6 mg/dL — ABNORMAL LOW (ref 1.7–2.4)

## 2016-06-06 LAB — PHOSPHORUS: Phosphorus: 2.9 mg/dL (ref 2.5–4.6)

## 2016-06-06 SURGERY — LAPAROSCOPIC CHOLECYSTECTOMY WITH INTRAOPERATIVE CHOLANGIOGRAM
Anesthesia: General | Site: Abdomen

## 2016-06-06 MED ORDER — OXYCODONE HCL 5 MG PO TABS: 5.0000 mg | ORAL_TABLET | Freq: Once | ORAL | Status: DC | PRN

## 2016-06-06 MED ORDER — MIDAZOLAM HCL 2 MG/2ML IJ SOLN
INTRAMUSCULAR | Status: AC
  Filled 2016-06-06: qty 2

## 2016-06-06 MED ORDER — PROPOFOL 10 MG/ML IV BOLUS
INTRAVENOUS | Status: DC | PRN
  Administered 2016-06-06: 150 mg via INTRAVENOUS

## 2016-06-06 MED ORDER — HYDROMORPHONE HCL 1 MG/ML IJ SOLN: 0.2500 mg | INTRAMUSCULAR | Status: DC | PRN

## 2016-06-06 MED ORDER — MIDAZOLAM HCL 5 MG/5ML IJ SOLN
INTRAMUSCULAR | Status: DC | PRN
  Administered 2016-06-06 (×2): 1 mg via INTRAVENOUS

## 2016-06-06 MED ORDER — FENTANYL CITRATE (PF) 100 MCG/2ML IJ SOLN
INTRAMUSCULAR | Status: DC | PRN
  Administered 2016-06-06: 100 ug via INTRAVENOUS
  Administered 2016-06-06: 50 ug via INTRAVENOUS

## 2016-06-06 MED ORDER — DEXAMETHASONE SODIUM PHOSPHATE 10 MG/ML IJ SOLN
INTRAMUSCULAR | Status: DC | PRN
  Administered 2016-06-06: 10 mg via INTRAVENOUS

## 2016-06-06 MED ORDER — LACTATED RINGERS IV SOLN
INTRAVENOUS | Status: DC | PRN
  Administered 2016-06-06 (×2): via INTRAVENOUS

## 2016-06-06 MED ORDER — IOPAMIDOL (ISOVUE-300) INJECTION 61%
INTRAVENOUS | Status: AC
  Filled 2016-06-06: qty 50

## 2016-06-06 MED ORDER — SUGAMMADEX SODIUM 200 MG/2ML IV SOLN
INTRAVENOUS | Status: DC | PRN
  Administered 2016-06-06: 150 mg via INTRAVENOUS

## 2016-06-06 MED ORDER — ONDANSETRON HCL 4 MG/2ML IJ SOLN: 4.0000 mg | Freq: Four times a day (QID) | INTRAMUSCULAR | Status: DC | PRN

## 2016-06-06 MED ORDER — SUGAMMADEX SODIUM 200 MG/2ML IV SOLN
INTRAVENOUS | Status: AC
  Filled 2016-06-06: qty 2

## 2016-06-06 MED ORDER — BUPIVACAINE LIPOSOME 1.3 % IJ SUSP
20.0000 mL | Freq: Once | INTRAMUSCULAR | Status: AC
  Administered 2016-06-06: 20 mL
  Filled 2016-06-06: qty 20

## 2016-06-06 MED ORDER — HEPARIN SODIUM (PORCINE) 5000 UNIT/ML IJ SOLN
5000.0000 [IU] | Freq: Three times a day (TID) | INTRAMUSCULAR | Status: DC
  Administered 2016-06-06 – 2016-06-09 (×5): 5000 [IU] via SUBCUTANEOUS
  Filled 2016-06-06 (×6): qty 1

## 2016-06-06 MED ORDER — CIPROFLOXACIN IN D5W 400 MG/200ML IV SOLN
INTRAVENOUS | Status: AC
  Filled 2016-06-06: qty 200

## 2016-06-06 MED ORDER — ROCURONIUM BROMIDE 100 MG/10ML IV SOLN
INTRAVENOUS | Status: DC | PRN
  Administered 2016-06-06: 25 mg via INTRAVENOUS
  Administered 2016-06-06: 15 mg via INTRAVENOUS

## 2016-06-06 MED ORDER — ONDANSETRON HCL 4 MG/2ML IJ SOLN
INTRAMUSCULAR | Status: AC
  Filled 2016-06-06: qty 2

## 2016-06-06 MED ORDER — ONDANSETRON 4 MG PO TBDP
4.0000 mg | ORAL_TABLET | Freq: Four times a day (QID) | ORAL | Status: DC | PRN
Start: 2016-06-06 — End: 2016-06-09

## 2016-06-06 MED ORDER — LIDOCAINE HCL (CARDIAC) 20 MG/ML IV SOLN
INTRAVENOUS | Status: DC | PRN
  Administered 2016-06-06: 75 mg via INTRAVENOUS
  Administered 2016-06-06: 25 mg via INTRATRACHEAL

## 2016-06-06 MED ORDER — IOPAMIDOL (ISOVUE-300) INJECTION 61%
INTRAVENOUS | Status: DC | PRN
  Administered 2016-06-06: 10 mL

## 2016-06-06 MED ORDER — OXYCODONE HCL 5 MG/5ML PO SOLN: 5.0000 mg | Freq: Once | ORAL | Status: DC | PRN

## 2016-06-06 MED ORDER — PROPOFOL 10 MG/ML IV BOLUS
INTRAVENOUS | Status: AC
  Filled 2016-06-06: qty 20

## 2016-06-06 MED ORDER — KCL IN DEXTROSE-NACL 20-5-0.45 MEQ/L-%-% IV SOLN
INTRAVENOUS | Status: DC
  Administered 2016-06-06: 1000 mL via INTRAVENOUS
  Administered 2016-06-07 – 2016-06-09 (×4): via INTRAVENOUS
  Filled 2016-06-06 (×7): qty 1000

## 2016-06-06 MED ORDER — ONDANSETRON HCL 4 MG/2ML IJ SOLN
INTRAMUSCULAR | Status: DC | PRN
  Administered 2016-06-06: 4 mg via INTRAVENOUS

## 2016-06-06 MED ORDER — LACTATED RINGERS IV SOLN: INTRAVENOUS | Status: DC

## 2016-06-06 MED ORDER — MORPHINE SULFATE (PF) 10 MG/ML IV SOLN: 1.0000 mg | INTRAVENOUS | Status: DC | PRN

## 2016-06-06 MED ORDER — FAMOTIDINE 20 MG PO TABS: 40.0000 mg | ORAL_TABLET | Freq: Two times a day (BID) | ORAL | Status: DC

## 2016-06-06 MED ORDER — 0.9 % SODIUM CHLORIDE (POUR BTL) OPTIME
TOPICAL | Status: DC | PRN
  Administered 2016-06-06: 1000 mL

## 2016-06-06 MED ORDER — CIPROFLOXACIN IN D5W 400 MG/200ML IV SOLN
400.0000 mg | Freq: Two times a day (BID) | INTRAVENOUS | Status: DC
  Administered 2016-06-06 – 2016-06-09 (×6): 400 mg via INTRAVENOUS
  Filled 2016-06-06 (×6): qty 200

## 2016-06-06 MED ORDER — DEXAMETHASONE SODIUM PHOSPHATE 10 MG/ML IJ SOLN
INTRAMUSCULAR | Status: AC
  Filled 2016-06-06: qty 1

## 2016-06-06 MED ORDER — MORPHINE SULFATE (PF) 2 MG/ML IV SOLN
1.0000 mg | INTRAVENOUS | Status: DC | PRN
  Administered 2016-06-06 – 2016-06-07 (×2): 1 mg via INTRAVENOUS
  Filled 2016-06-06: qty 1

## 2016-06-06 MED ORDER — FENTANYL CITRATE (PF) 250 MCG/5ML IJ SOLN
INTRAMUSCULAR | Status: AC
  Filled 2016-06-06: qty 5

## 2016-06-06 MED ORDER — SUCCINYLCHOLINE CHLORIDE 20 MG/ML IJ SOLN
INTRAMUSCULAR | Status: DC | PRN
  Administered 2016-06-06: 140 mg via INTRAVENOUS

## 2016-06-06 MED ORDER — ROCURONIUM BROMIDE 50 MG/5ML IV SOSY
PREFILLED_SYRINGE | INTRAVENOUS | Status: AC
  Filled 2016-06-06: qty 5

## 2016-06-06 MED ORDER — LIDOCAINE 2% (20 MG/ML) 5 ML SYRINGE
INTRAMUSCULAR | Status: AC
  Filled 2016-06-06: qty 5

## 2016-06-06 MED ORDER — PANTOPRAZOLE SODIUM 40 MG IV SOLR
40.0000 mg | Freq: Every day | INTRAVENOUS | Status: DC
  Administered 2016-06-06 – 2016-06-08 (×3): 40 mg via INTRAVENOUS
  Filled 2016-06-06 (×3): qty 40

## 2016-06-06 MED ORDER — LACTATED RINGERS IR SOLN
Status: DC | PRN
  Administered 2016-06-06: 3000 mL

## 2016-06-06 SURGICAL SUPPLY — 42 items
ADH SKN CLS APL DERMABOND .7 (GAUZE/BANDAGES/DRESSINGS) ×1
APL SKNCLS STERI-STRIP NONHPOA (GAUZE/BANDAGES/DRESSINGS)
APPLICATOR COTTON TIP 6IN STRL (MISCELLANEOUS) ×4 IMPLANT
APPLIER CLIP ROT 10 11.4 M/L (STAPLE) ×2
APR CLP MED LRG 11.4X10 (STAPLE) ×1
BAG SPEC RTRVL 10 TROC 200 (ENDOMECHANICALS) ×1
BENZOIN TINCTURE PRP APPL 2/3 (GAUZE/BANDAGES/DRESSINGS) IMPLANT
CABLE HIGH FREQUENCY MONO STRZ (ELECTRODE) ×2 IMPLANT
CATH REDDICK CHOLANGI 4FR 50CM (CATHETERS) ×2 IMPLANT
CLIP APPLIE ROT 10 11.4 M/L (STAPLE) ×1 IMPLANT
COVER MAYO STAND STRL (DRAPES) ×2 IMPLANT
COVER SURGICAL LIGHT HANDLE (MISCELLANEOUS) ×2 IMPLANT
DECANTER SPIKE VIAL GLASS SM (MISCELLANEOUS) ×2 IMPLANT
DERMABOND ADVANCED (GAUZE/BANDAGES/DRESSINGS) ×1
DERMABOND ADVANCED .7 DNX12 (GAUZE/BANDAGES/DRESSINGS) ×1 IMPLANT
DEVICE TROCAR PUNCTURE CLOSURE (ENDOMECHANICALS) ×1 IMPLANT
DRAPE C-ARM 42X120 X-RAY (DRAPES) ×2 IMPLANT
ELECT PENCIL ROCKER SW 15FT (MISCELLANEOUS) ×1 IMPLANT
ELECT REM PT RETURN 9FT ADLT (ELECTROSURGICAL) ×2
ELECTRODE REM PT RTRN 9FT ADLT (ELECTROSURGICAL) ×1 IMPLANT
GLOVE BIOGEL M 8.0 STRL (GLOVE) ×2 IMPLANT
GOWN STRL REUS W/TWL XL LVL3 (GOWN DISPOSABLE) ×6 IMPLANT
HEMOSTAT SURGICEL 4X8 (HEMOSTASIS) IMPLANT
IRRIG SUCT STRYKERFLOW 2 WTIP (MISCELLANEOUS) ×2
IRRIGATION SUCT STRKRFLW 2 WTP (MISCELLANEOUS) ×1 IMPLANT
IV CATH 14GX2 1/4 (CATHETERS) ×2 IMPLANT
KIT BASIN OR (CUSTOM PROCEDURE TRAY) ×2 IMPLANT
L-HOOK LAP DISP 36CM (ELECTROSURGICAL) ×2
LHOOK LAP DISP 36CM (ELECTROSURGICAL) IMPLANT
POUCH RETRIEVAL ECOSAC 10 (ENDOMECHANICALS) IMPLANT
POUCH RETRIEVAL ECOSAC 10MM (ENDOMECHANICALS) ×1
SCISSORS LAP 5X45 EPIX DISP (ENDOMECHANICALS) ×2 IMPLANT
SLEEVE XCEL OPT CAN 5 100 (ENDOMECHANICALS) ×2 IMPLANT
STRIP CLOSURE SKIN 1/2X4 (GAUZE/BANDAGES/DRESSINGS) IMPLANT
SUT VIC AB 4-0 SH 18 (SUTURE) ×2 IMPLANT
SYR 20CC LL (SYRINGE) ×2 IMPLANT
TOWEL OR 17X26 10 PK STRL BLUE (TOWEL DISPOSABLE) ×2 IMPLANT
TRAY LAPAROSCOPIC (CUSTOM PROCEDURE TRAY) ×2 IMPLANT
TROCAR BLADELESS OPT 5 100 (ENDOMECHANICALS) ×2 IMPLANT
TROCAR XCEL BLUNT TIP 100MML (ENDOMECHANICALS) IMPLANT
TROCAR XCEL NON-BLD 11X100MML (ENDOMECHANICALS) ×2 IMPLANT
TUBING INSUF HEATED (TUBING) ×2 IMPLANT

## 2016-06-06 NOTE — Progress Notes (Addendum)
[facility] Progress Note  CC:  Abdominal pain and elevated LFT's  Subjective:  Feels good.  Still no recurrent pain.  Is hungry.  LFT's down slightly again today.  Objective:  Vital signs in last 24 hours: Temp:  [98.9 F (37.2 C)] 98.9 F (37.2 C) (12/08 0559) Pulse Rate:  [77-79] 79 (12/08 0559) Resp:  [16] 16 (12/08 0559) BP: (117)/(49-60) 117/49 (12/08 0559) SpO2:  [96 %-97 %] 97 % (12/08 0559) Last BM Date: 06/04/16 General:  Alert, Well-developed, in NAD Heart:  Regular rate and rhythm; no murmurs Pulm:  CTAB.  No W/R/R. Abdomen:  Soft, non-distended.  BS present.  Non-tender.  Extremities:  Without edema. Neurologic:  Alert and oriented x 4;  grossly normal neurologically. Psych:  Alert and cooperative. Normal mood and affect.  Intake/Output from previous day: 12/07 0701 - 12/08 0700 In: 2606.7 [P.O.:120; I.V.:2486.7] Out: 2000 [Urine:2000]  Lab Results:  Recent Labs  06/04/16 1734 06/05/16 0432 06/06/16 0418  WBC 7.3 8.1 4.5  HGB 12.7 12.4 11.7*  HCT 39.7 38.5 36.3  PLT 159 135* 127*   BMET  Recent Labs  06/04/16 1734 06/05/16 0432 06/06/16 0418  NA 141 139 142  K 4.0 3.8 3.5  CL 105 105 110  CO2 30 27 26   GLUCOSE 107* 103* 89  BUN 12 11 8   CREATININE 0.69 0.81 0.76  CALCIUM 9.8 9.2 8.4*   LFT  Recent Labs  06/06/16 0418  PROT 5.7*  ALBUMIN 3.5  AST 199*  ALT 228*  ALKPHOS 109  BILITOT 2.8*   Hepatitis Panel  Recent Labs  06/05/16 1708  HEPBSAG Negative  HCVAB <0.1  HEPAIGM Negative  HEPBIGM Negative    Dg Chest 2 View  Result Date: 06/04/2016 CLINICAL DATA:  Cough with chest pain. EXAM: CHEST  2 VIEW COMPARISON:  08/17/2014 FINDINGS: The cardio pericardial silhouette is enlarged. Prominence of the thoracic aorta raises the question of thoracic aortic aneurysm. The lungs are clear bilaterally. The visualized bony structures of the thorax are intact. IMPRESSION: 1. No acute cardiopulmonary findings. 2.  Cardiomegaly with thoracic aortic prominence, unchanged in the interval, but raising concern for aneurysm. CT chest could be used to further evaluate as clinically warranted. Electronically Signed   By: Misty Stanley M.D.   On: 06/04/2016 18:46   US Abdomen Complete  Result Date: 06/04/2016 CLINICAL DATA:  Midline abdominal pain radiating to central chest x1 week intermittently. Nausea intermittently. History of umbilical surgery years ago. EXAM: ABDOMEN ULTRASOUND COMPLETE COMPARISON:  CT report from 12/27/2000 FINDINGS: Gallbladder: The gallbladder is contracted. There is a gallstone in the body of the gallbladder measuring 2.6 x 1.3 x 2.1 cm. No sonographic Murphy's sign was elicited. Gallbladder wall thickness is top normal at 3 mm. Common bile duct: Diameter: Normal at 3.6 mm Liver: Slightly coarsened and increased in echogenicity without space-occupying mass or biliary dilatation. IVC: No abnormality visualized. Pancreas: There is an ovoid 1.3 x 0.4 cm area of hypoechogenicity along the ventral pancreatic head and body. Spleen: Size and appearance within normal limits. Right Kidney: Length: 9 cm. Echogenicity within normal limits. No mass or hydronephrosis visualized. Left Kidney: Length: 9.9 cm. Echogenicity within normal limits. No mass or hydronephrosis visualized. Abdominal aorta: No aneurysm visualized. Other findings: None. IMPRESSION: Uncomplicated cholelithiasis with a gallstone measuring 2.6 x 1.3 x 2.1 cm noted. No secondary signs of acute cholecystitis. 1.3 x 0.4 cm of hypoechogenicity along the ventral pancreatic head and body, nonspecific in etiology. CT  without and with IV contrast may help for further correlation. No ductal dilatation is seen. i Electronically Signed   By: Ashley Royalty M.D.   On: 06/04/2016 21:07   Mr 3d Recon At Scanner  Result Date: 06/05/2016 CLINICAL DATA:  Cholelithiasis with elevated trans am an Ace levels and hyperbilirubinemia. EXAM: MRI ABDOMEN WITHOUT AND WITH  CONTRAST (INCLUDING MRCP) TECHNIQUE: Multiplanar multisequence MR imaging of the abdomen was performed both before and after the administration of intravenous contrast. Heavily T2-weighted images of the biliary and pancreatic ducts were obtained, and three-dimensional MRCP images were rendered by post processing. CONTRAST:  15 cc MultiHance COMPARISON:  CT scans from 06/04/2016 FINDINGS: Despite efforts by the technologist and patient, motion artifact is present on today's exam and could not be eliminated. This reduces exam sensitivity and specificity. Lower chest: Mild cardiomegaly. Hepatobiliary: 2.4 cm in long axis gallstone in the gallbladder on image 23/3, along with some dependent low T2 signal material in the gallbladder probably sludge, less likely tiny gallstones. No intrahepatic biliary dilatation. No extrahepatic biliary dilatation. No filling defect in the intrahepatic or extrahepatic bile ducts is identified. No abnormal hepatic enhancement or hepatic morphology identified. No significant gallbladder wall thickening or pericholecystic fluid. Pancreas: Prior hypoechogenicity along the ventral pancreatic head and body was raised as a concern on the prior ultrasound; there is no worrisome lesion in this vicinity on today' s MRI, or on the prior CT, and the sonographic appearance was likely spurious. Spleen:  Unremarkable Adrenals/Urinary Tract: Several tiny nonenhancing lesions in the kidneys favor small cysts. No worrisome renal enhancement. Stomach/Bowel: Nondistended stomach. No significant abnormality observed. Vascular/Lymphatic:  Unremarkable Other:  No supplemental non-categorized findings. Musculoskeletal: There is a suggestion of grade 1 anterolisthesis at L4-5 on one of the scout images. IMPRESSION: 1. Gallstone noted in the gallbladder but no intrahepatic or extrahepatic biliary abnormality is identified. No focal lesion in the liver. 2. No pancreatic lesion observed. 3. Grade 1 anterolisthesis  at L4-5. 4. Mild cardiomegaly. 5. An obstructive cause for the patient's elevated liver enzymes and elevated bilirubin is not identified. If workup for hepatocellular dysfunction is warranted, nuclear medicine hepatobiliary scan can sometimes be helpful. Electronically Signed   By: Van Clines M.D.   On: 06/05/2016 15:17   Mr Abdomen Mrcp Moise Boring Contast  Result Date: 06/05/2016 CLINICAL DATA:  Cholelithiasis with elevated trans am an Ace levels and hyperbilirubinemia. EXAM: MRI ABDOMEN WITHOUT AND WITH CONTRAST (INCLUDING MRCP) TECHNIQUE: Multiplanar multisequence MR imaging of the abdomen was performed both before and after the administration of intravenous contrast. Heavily T2-weighted images of the biliary and pancreatic ducts were obtained, and three-dimensional MRCP images were rendered by post processing. CONTRAST:  15 cc MultiHance COMPARISON:  CT scans from 06/04/2016 FINDINGS: Despite efforts by the technologist and patient, motion artifact is present on today's exam and could not be eliminated. This reduces exam sensitivity and specificity. Lower chest: Mild cardiomegaly. Hepatobiliary: 2.4 cm in long axis gallstone in the gallbladder on image 23/3, along with some dependent low T2 signal material in the gallbladder probably sludge, less likely tiny gallstones. No intrahepatic biliary dilatation. No extrahepatic biliary dilatation. No filling defect in the intrahepatic or extrahepatic bile ducts is identified. No abnormal hepatic enhancement or hepatic morphology identified. No significant gallbladder wall thickening or pericholecystic fluid. Pancreas: Prior hypoechogenicity along the ventral pancreatic head and body was raised as a concern on the prior ultrasound; there is no worrisome lesion in this vicinity on today' s MRI, or on  the prior CT, and the sonographic appearance was likely spurious. Spleen:  Unremarkable Adrenals/Urinary Tract: Several tiny nonenhancing lesions in the kidneys favor  small cysts. No worrisome renal enhancement. Stomach/Bowel: Nondistended stomach. No significant abnormality observed. Vascular/Lymphatic:  Unremarkable Other:  No supplemental non-categorized findings. Musculoskeletal: There is a suggestion of grade 1 anterolisthesis at L4-5 on one of the scout images. IMPRESSION: 1. Gallstone noted in the gallbladder but no intrahepatic or extrahepatic biliary abnormality is identified. No focal lesion in the liver. 2. No pancreatic lesion observed. 3. Grade 1 anterolisthesis at L4-5. 4. Mild cardiomegaly. 5. An obstructive cause for the patient's elevated liver enzymes and elevated bilirubin is not identified. If workup for hepatocellular dysfunction is warranted, nuclear medicine hepatobiliary scan can sometimes be helpful. Electronically Signed   By: Van Clines M.D.   On: 06/05/2016 15:17   Ct Angio Chest Aorta W/cm &/or Wo/cm  Result Date: 06/04/2016 CLINICAL DATA:  Chest, abdominal and back pain. EXAM: CT ANGIOGRAPHY CHEST, ABDOMEN AND PELVIS TECHNIQUE: Multidetector CT imaging through the chest, abdomen and pelvis was performed using the standard protocol during bolus administration of intravenous contrast. Multiplanar reconstructed images and MIPs were obtained and reviewed to evaluate the vascular anatomy. CONTRAST:  100 cc Isovue 370 IV COMPARISON:  Chest radiograph earlier this day. FINDINGS: CTA CHEST FINDINGS Cardiovascular: Thoracic aortic tortuosity without dissection or aneurysm. Maximal ascending aortic dimension 3.6 cm. Left vertebral artery arises directly from the aortic arch, a normal variant. No aortic hematoma. There is scattered mild atherosclerosis. Mild multi chamber cardiomegaly. Mediastinum/Nodes: No mediastinal, hilar, or axillary adenopathy. Left axillary lymph nodes with normal fatty hila. No pericardial effusion. The esophagus is patulous. No focal abnormality in the visualized thyroid gland. Lungs/Pleura: Mild breathing motion artifact.  Tiny calcified granuloma in the right lower lobe. Scattered linear atelectasis. No consolidation. No evidence pulmonary edema. No pleural fluid. Musculoskeletal: Multilevel degenerative change throughout the spine. There are no acute or suspicious osseous abnormalities. Review of the MIP images confirms the above findings. CTA ABDOMEN AND PELVIS FINDINGS VASCULAR Aorta: Normal caliber aorta without aneurysm, dissection, vasculitis or significant stenosis. Celiac: Patent without evidence of aneurysm, dissection, vasculitis or significant stenosis. SMA: Patent without evidence of aneurysm, dissection, vasculitis or significant stenosis. Renals: Both renal arteries are patent without evidence of aneurysm, dissection, vasculitis, fibromuscular dysplasia or significant stenosis. IMA: Patent without evidence of aneurysm, dissection, vasculitis or significant stenosis. Inflow: Patent without evidence of aneurysm, dissection, vasculitis or significant stenosis. Veins: No obvious venous abnormality within the limitations of this arterial phase study. Review of the MIP images confirms the above findings. NON-VASCULAR Hepatobiliary: Gallstone on ultrasound earlier this day is not visualized by CT. No abnormal gallbladder distention. Normal arterial phase appearance of the liver. Pancreas: No definite CT correlate with the hypo echogenicity adjacent of the ventral pancreatic head on ultrasound. No ductal dilatation or inflammation. Spleen: Normal arterial phase appearance. Adrenals/Urinary Tract: No adrenal nodule. No hydronephrosis or perinephric edema. Symmetric enhancement. Ureters are decompressed. Urinary bladder is mildly distended, no wall thickening. Stomach/Bowel: Significant colonic redundancy. A few diverticula at the hepatic flexure. No colonic inflammation. No small bowel dilatation or wall thickening. Appendix is not visualized, surgically absent. Stomach is distended with ingested contents. Lymphatic: No  abdominal or pelvic adenopathy. Reproductive: Status post hysterectomy. No adnexal masses. Other: No free air free fluid.  Fat containing umbilical hernia. Musculoskeletal: Degenerative change throughout the lumbar spine with predominant lower facet arthropathy. Anterolisthesis of L4 on L5 appears degenerative. There are no acute or  suspicious osseous abnormalities. Review of the MIP images confirms the above findings. IMPRESSION: 1. No acute aortic abnormality. Tortuous thoracic aorta without dissection or aneurysm. 2. No acute abnormality in the chest, abdomen, or pelvis. 3. Patulous esophagus, can be seen with reflux. 4. No definite peripancreatic abnormality corresponding to the hypoechoic structure on ultrasound. Electronically Signed   By: Jeb Levering M.D.   On: 06/04/2016 23:14   Ct Angio Abd/pel W/ And/or W/o  Result Date: 06/04/2016 CLINICAL DATA:  Chest, abdominal and back pain. EXAM: CT ANGIOGRAPHY CHEST, ABDOMEN AND PELVIS TECHNIQUE: Multidetector CT imaging through the chest, abdomen and pelvis was performed using the standard protocol during bolus administration of intravenous contrast. Multiplanar reconstructed images and MIPs were obtained and reviewed to evaluate the vascular anatomy. CONTRAST:  100 cc Isovue 370 IV COMPARISON:  Chest radiograph earlier this day. FINDINGS: CTA CHEST FINDINGS Cardiovascular: Thoracic aortic tortuosity without dissection or aneurysm. Maximal ascending aortic dimension 3.6 cm. Left vertebral artery arises directly from the aortic arch, a normal variant. No aortic hematoma. There is scattered mild atherosclerosis. Mild multi chamber cardiomegaly. Mediastinum/Nodes: No mediastinal, hilar, or axillary adenopathy. Left axillary lymph nodes with normal fatty hila. No pericardial effusion. The esophagus is patulous. No focal abnormality in the visualized thyroid gland. Lungs/Pleura: Mild breathing motion artifact. Tiny calcified granuloma in the right lower lobe.  Scattered linear atelectasis. No consolidation. No evidence pulmonary edema. No pleural fluid. Musculoskeletal: Multilevel degenerative change throughout the spine. There are no acute or suspicious osseous abnormalities. Review of the MIP images confirms the above findings. CTA ABDOMEN AND PELVIS FINDINGS VASCULAR Aorta: Normal caliber aorta without aneurysm, dissection, vasculitis or significant stenosis. Celiac: Patent without evidence of aneurysm, dissection, vasculitis or significant stenosis. SMA: Patent without evidence of aneurysm, dissection, vasculitis or significant stenosis. Renals: Both renal arteries are patent without evidence of aneurysm, dissection, vasculitis, fibromuscular dysplasia or significant stenosis. IMA: Patent without evidence of aneurysm, dissection, vasculitis or significant stenosis. Inflow: Patent without evidence of aneurysm, dissection, vasculitis or significant stenosis. Veins: No obvious venous abnormality within the limitations of this arterial phase study. Review of the MIP images confirms the above findings. NON-VASCULAR Hepatobiliary: Gallstone on ultrasound earlier this day is not visualized by CT. No abnormal gallbladder distention. Normal arterial phase appearance of the liver. Pancreas: No definite CT correlate with the hypo echogenicity adjacent of the ventral pancreatic head on ultrasound. No ductal dilatation or inflammation. Spleen: Normal arterial phase appearance. Adrenals/Urinary Tract: No adrenal nodule. No hydronephrosis or perinephric edema. Symmetric enhancement. Ureters are decompressed. Urinary bladder is mildly distended, no wall thickening. Stomach/Bowel: Significant colonic redundancy. A few diverticula at the hepatic flexure. No colonic inflammation. No small bowel dilatation or wall thickening. Appendix is not visualized, surgically absent. Stomach is distended with ingested contents. Lymphatic: No abdominal or pelvic adenopathy. Reproductive: Status post  hysterectomy. No adnexal masses. Other: No free air free fluid.  Fat containing umbilical hernia. Musculoskeletal: Degenerative change throughout the lumbar spine with predominant lower facet arthropathy. Anterolisthesis of L4 on L5 appears degenerative. There are no acute or suspicious osseous abnormalities. Review of the MIP images confirms the above findings. IMPRESSION: 1. No acute aortic abnormality. Tortuous thoracic aorta without dissection or aneurysm. 2. No acute abnormality in the chest, abdomen, or pelvis. 3. Patulous esophagus, can be seen with reflux. 4. No definite peripancreatic abnormality corresponding to the hypoechoic structure on ultrasound. Electronically Signed   By: Jeb Levering M.D.   On: 06/04/2016 23:14   Assessment / Plan: -75 year  old female with intermittent upper abdominal pain, some pain into her right shoulder, and nausea for the past week.  LFT's elevated and trended up initially but down some now today.  Ultrasound and CT scan showed gallstones, no other cause.  MRCP negative for CBD stone.    -Esophagram ordered by surgery.  We canceled this test for now.  We think that it is unlikely that she would have something esophageal causing her pain as well as something else causing her elevated LFT's at the same time.  Also, the barium is very dense and may impede necessary testing if she were to proceed for lap chole/IOC and then if has a positive IOC and would need ERCP.  Viral hepatitis studies negative.  In our opinion this does sound biliary in nature and lap chole with IOC seems appropriate, but obviously will defer decision to surgery.   LOS: 1 day   ZEHR, JESSICA D.  06/06/2016, 9:02 AM Pager number [phone]     Attending physician's note   I have taken an interval history, reviewed the chart and examined the patient. I agree with the Advanced Practitioner's note, impression and recommendations. Lap chole with IOC is planned for today. Viral hepatitis  serologies negative.    Lucio Edward, MD [facility] [phone] Mon-Fri 8a-5p [phone] after 5p, weekends, holidays

## 2016-06-06 NOTE — Transfer of Care (Signed)
Immediate Anesthesia Transfer of Care Note  Patient: Monique [name]  Procedure(s) Performed: Procedure(s): LAPAROSCOPIC CHOLECYSTECTOMY WITH INTRAOPERATIVE CHOLANGIOGRAM (N/A)  Patient Location: PACU  Anesthesia Type:General  Level of Consciousness: sedated  Airway & Oxygen Therapy: Patient Spontanous Breathing and Patient connected to face mask oxygen  Post-op Assessment: Report given to RN and Post -op Vital signs reviewed and stable  Post vital signs: Reviewed and stable  Last Vitals:  Vitals:   06/06/16 0559 06/06/16 1242  BP: (!) 117/49 (!) 165/88  Pulse: 79 96  Resp: 16 16  Temp: 37.2 C 37.3 C    Last Pain:  Vitals:   06/06/16 1242  TempSrc: Oral  PainSc:          Complications: No apparent anesthesia complications

## 2016-06-06 NOTE — Anesthesia Postprocedure Evaluation (Signed)
Anesthesia Post Note  Patient: Monique [name]  Procedure(s) Performed: Procedure(s) (LRB): LAPAROSCOPIC CHOLECYSTECTOMY WITH INTRAOPERATIVE CHOLANGIOGRAM (N/A)  Patient location during evaluation: PACU Anesthesia Type: General Level of consciousness: awake and alert and patient cooperative Pain management: pain level controlled Vital Signs Assessment: post-procedure vital signs reviewed and stable Respiratory status: spontaneous breathing and respiratory function stable Cardiovascular status: stable Anesthetic complications: no    Last Vitals:  Vitals:   06/06/16 1615 06/06/16 1628  BP:  (!) 114/59  Pulse:  82  Resp:  15  Temp: 36.7 C 37.6 C    Last Pain:  Vitals:   06/06/16 1600  TempSrc:   PainSc: [place]

## 2016-06-06 NOTE — Anesthesia Preprocedure Evaluation (Addendum)
Anesthesia Evaluation  Patient identified by MRN, date of birth, ID band Patient awake    Reviewed: Allergy & Precautions, H&P , NPO status , Patient's Chart, lab work & pertinent test results  Airway Mallampati: II   Neck ROM: full    Dental   Pulmonary    breath sounds clear to auscultation       Cardiovascular hypertension,  Rhythm:regular Rate:Normal     Neuro/Psych  Headaches, Anxiety    GI/Hepatic GERD  ,  Endo/Other    Renal/GU      Musculoskeletal  (+) Arthritis ,   Abdominal   Peds  Hematology   Anesthesia Other Findings   Reproductive/Obstetrics                             Anesthesia Physical Anesthesia Plan  ASA: III  Anesthesia Plan: General   Post-op Pain Management:    Induction: Intravenous  Airway Management Planned: Oral ETT  Additional Equipment:   Intra-op Plan:   Post-operative Plan: Extubation in OR  Informed Consent: I have reviewed the patients History and Physical, chart, labs and discussed the procedure including the risks, benefits and alternatives for the proposed anesthesia with the patient or authorized representative who has indicated his/her understanding and acceptance.     Plan Discussed with: CRNA, Anesthesiologist and Surgeon  Anesthesia Plan Comments:         Anesthesia Quick Evaluation

## 2016-06-06 NOTE — Op Note (Signed)
Monique [name]   06/06/2016  3:57 PM  Procedure: Laparoscopic Cholecystectomy with intraoperative cholangiogram  Surgeon: Catalina Antigua B. 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  The patient was prepped with PCMX and draped sterilely. A time out was performed.  Access to the abdomen was achieved with with a 5 mm Optiview through the right upper quadrant.  The midline was chronically adherent with omentum stuck to prior umbilical hernia repair.  Port placement included a 5 in the right lower quadrant and a 12 in the upper midline and a lower midline 5 mm port for the camera.    The gallbladder was visualized and the fundus was grasped and the gallbladder was elevated. I operated from the patient's right side.   Traction on the infundibulum allowed for successful demonstration of the critical view. Inflammatory changes were chronic.  The cystic duct was identified and clipped up on the gallbladder and an incision was made in the cystic duct and the Reddick catheter was inserted after milking the cystic duct of any debris.  The debris amounted to about 50 tiny black stones.  A dynamic cholangiogram was performed which demonstrated the impaction of small stones in the distal CBD.  Eventually, some contrast made its way to the duodenum but upon releasing the Reddick catheter, the backflow contained scores of black stones.    The cystic duct was then triple clipped and divided, the cystic artery was double clipped and divided and then the gallbladder was removed from the gallbladder bed. Removal of the gallbladder from the gallbladder bed was straightforward and there was no leakage.  The gallbladder was then placed in a bag and brought out through one of the trocar sites. The  gallbladder bed was inspected and no bleeding or bile leaks were seen.   Laparoscopic visualization was used when closing fascial defects for trocar sites.   Incisions were injected with Exparel and closed with 4-0 Monocryl and Monocryl  on the skin.  Sponge and needle count were correct.    The patient was taken to the recovery room in satisfactory condition.

## 2016-06-06 NOTE — Anesthesia Procedure Notes (Signed)
Procedure Name: Intubation Date/Time: 06/06/2016 2:10 PM Performed by: Lissa Morales Pre-anesthesia Checklist: Patient identified, Emergency Drugs available, Suction available and Patient being monitored Patient Re-evaluated:Patient Re-evaluated prior to inductionOxygen Delivery Method: Circle system utilized Preoxygenation: Pre-oxygenation with 100% oxygen Intubation Type: IV induction Ventilation: Mask ventilation without difficulty Laryngoscope Size: Miller and 3 Grade View: Grade II Tube type: Oral Tube size: 7.0 mm Number of attempts: 1 Airway Equipment and Method: Stylet and Oral airway Placement Confirmation: ETT inserted through vocal cords under direct vision,  positive ETCO2 and breath sounds checked- equal and bilateral Secured at: 21 cm Tube secured with: Tape Dental Injury: Teeth and Oropharynx as per pre-operative assessment

## 2016-06-06 NOTE — Progress Notes (Signed)
Dr. Hassell Done contacted me with abnl IOC. Pt still in OR/PACU so I did not see her to review this finding. IOC personally reviewed and the IOC images show several small distal CBD filling defects c/w small stones. MRCP yesterday showed a normal CBD without filling defects or dilation. LFTs and t bili remained elevated this morning. Dr. Collene Mares is covering for the weekend and will ask her to contact the weekend biliary coverage tomorrow.

## 2016-06-06 NOTE — Progress Notes (Signed)
PROGRESS NOTE    [name]  [id number] DOB: 08-Apr-1941 DOA: 06/04/2016 PCP: Kathlene November, MD   Brief Narrative:  Monique [name] is a 75 y.o. female with medical history significant of HTN, HLD, and anemia; who presented with complaints of waxing and waning abdominal pain. Symptoms had been occurring over the last week. Patient describes the pain as achy and pressure like feeling. Pain radiated to her back ,mid to upper stomach, and chest. Nothing seemed to aggravate or alleviate symptoms. Unable to correlate pain onset with eating. Patient never had previous symptoms like this in the past. Denies having any fever, chills, sweats, vomiting, loss of consciousness, dysuria, palpitations, alcohol use, or shortness of breath. No previous history of heart issues or heart disease in her family. Patient had been evaluated at her PCPs office and was recommended to come to the emergency department for further evaluation. Surgery and GI following. Patient to go to Surgery today.   Assessment & Plan:   Principal Problem:   Cholelithiases Active Problems:   Essential hypertension   Allergic rhinitis   Anemia   GERD (gastroesophageal reflux disease)   Abdominal pain   Elevated LFTs  Symptomatic Cholelithiases with transaminitis and hyperbilirubinemia  -Acute.Patient underwent full workup for her abdominal pain complaints which revealed no signs of PE or dissection. Ultrasound revealed cholelithiasis without acute cholecystitis. Lab work showed elevated AST, ALT, and total bilirubin. -General Surgery Consulted and GI Consulted -MRCP did not show biliary Abnormality; Esophagram Cancelled by GI -Viral Hepatitis Serologies ordered by GI -General Surgery to take patient to surgery today.  Anemia:  - Hemoglobin 12.7 on admission. - Continue to monitor as Hb/Hct went to 11.7/36.3 - Repeat CBC in AM  Essential Hypertension  - Continue spironolactone and Coreg  Allergic Rhinitis -Continue  Diphenhydramine 25 mg po q6hprn, Fluticasone 50 mcg/ACT 1-2 Spray daily prn, Montelukast 10 mg po qhS, and Loratadine 10 mg po Daily  GERD -Continue Famotidine 40 mg po BID  DVT prophylaxis: Heparin 5,000 units sq Code Status: FULL CODE Family Communication: Discussed plan of Care with Son at bedside Disposition Plan: To remain Inpatient at this time.  Consultants:   General Surgery  Gastroenterology  Procedures: Laparoscopic Cholecystectomy with Intraoperative Cholangiogram  Antimicrobials: IV Ciprofloxacin 400 mg for Surgical Prophylaxis  Subjective: Seen and examined prior to her Cholecystectomy and stated her Right shoulder was paining but not as bad. No N/V/Abdominal Pain but was hungry. No CP or SOB. Ready to have Procedure done today.   Objective: Vitals:   06/05/16 2219 06/06/16 0559 06/06/16 1242 06/06/16 1540  BP: 117/60 (!) 117/49 (!) 165/88 119/70  Pulse: 77 79 96 81  Resp: 16 16 16 15   Temp: 98.9 F (37.2 C) 98.9 F (37.2 C) 99.1 F (37.3 C) 98 F (36.7 C)  TempSrc: Oral Oral Oral   SpO2: 96% 97% 97% 100%  Weight:      Height:        Intake/Output Summary (Last 24 hours) at 06/06/16 1606 Last data filed at 06/06/16 1534  Gross per 24 hour  Intake             4320 ml  Output             2010 ml  Net             2310 ml   Filed Weights   06/04/16 1702 06/05/16 0112  Weight: 76.2 kg (168 lb) 74.4 kg (164 lb)   Examination: Physical Exam:  Constitutional: WN/WD, NAD and appears calm and comfortable Eyes: Lids and conjunctivae normal, sclerae anicteric  ENMT: External Ears, Nose appear normal. Grossly normal hearing.  Neck: Appears normal, supple, no cervical masses, normal ROM, no appreciable thyromegaly, no JVD Respiratory: Clear to auscultation bilaterally, no wheezing, rales, rhonchi or crackles. Normal respiratory effort and patient is not tachypenic. No accessory muscle use.  Cardiovascular: RRR, no murmurs / rubs / gallops. S1 and S2  auscultated. No extremity edema.  Abdomen: Soft, non-tender to palpation, non-distended. No masses palpated. No appreciable hepatosplenomegaly. Bowel sounds positive x4.  GU: Deferred. Musculoskeletal: No clubbing / cyanosis of digits/nails. No joint deformity upper and lower extremities. Normal strength and muscle tone.  Skin: No rashes, lesions, ulcers. No induration; Warm and dry.  Neurologic: CN 2-12 grossly intact with no focal deficits. Sensation intact in all 4 Extremities. Romberg sign cerebellar reflexes not assessed.  Psychiatric: Normal judgment and insight. Alert and oriented x 3. Normal mood and appropriate affect.   Data Reviewed: I have personally reviewed following labs and imaging studies  CBC:  Recent Labs Lab 06/04/16 1734 06/05/16 0432 06/06/16 0418  WBC 7.3 8.1 4.5  NEUTROABS 4.5  --  2.2  HGB 12.7 12.4 11.7*  HCT 39.7 38.5 36.3  MCV 71.4* 71.6* 72.0*  PLT 159 135* [id number]*   Basic Metabolic Panel:  Recent Labs Lab 06/04/16 1734 06/05/16 0432 06/06/16 0418  NA 141 139 142  K 4.0 3.8 3.5  CL 105 105 110  CO2 30 27 26   GLUCOSE 107* 103* 89  BUN 12 11 8   CREATININE 0.69 0.81 0.76  CALCIUM 9.8 9.2 8.4*  MG  --   --  1.6*  PHOS  --   --  2.9   GFR: Estimated Creatinine Clearance: 57.4 mL/min (by C-G formula based on SCr of 0.76 mg/dL). Liver Function Tests:  Recent Labs Lab 06/04/16 1734 06/05/16 0432 06/06/16 0418  AST 297* 436* 199*  ALT 138* 275* 228*  ALKPHOS 66 99 109  BILITOT 1.6* 3.3* 2.8*  PROT 6.8 6.3* 5.7*  ALBUMIN 4.0 3.9 3.5    Recent Labs Lab 06/04/16 1734  LIPASE 46   No results for input(s): AMMONIA in the last 168 hours. Coagulation Profile: No results for input(s): INR, PROTIME in the last 168 hours. Cardiac Enzymes:  Recent Labs Lab 06/04/16 1734 06/05/16 0432  TROPONINI <0.03 <0.03   BNP (last 3 results) No results for input(s): PROBNP in the last 8760 hours. HbA1C: No results for input(s): HGBA1C in the last  72 hours. CBG: No results for input(s): GLUCAP in the last 168 hours. Lipid Profile: No results for input(s): CHOL, HDL, LDLCALC, TRIG, CHOLHDL, LDLDIRECT in the last 72 hours. Thyroid Function Tests: No results for input(s): TSH, T4TOTAL, FREET4, T3FREE, THYROIDAB in the last 72 hours. Anemia Panel: No results for input(s): VITAMINB12, FOLATE, FERRITIN, TIBC, IRON, RETICCTPCT in the last 72 hours. Sepsis Labs: No results for input(s): PROCALCITON, LATICACIDVEN in the last 168 hours.  Recent Results (from the past 240 hour(s))  Surgical pcr screen     Status: None   Collection Time: 06/05/16  2:41 AM  Result Value Ref Range Status   MRSA, PCR NEGATIVE NEGATIVE Final   Staphylococcus aureus NEGATIVE NEGATIVE Final    Comment:        The Xpert SA Assay (FDA approved for NASAL specimens in patients over 3 years of age), is one component of a comprehensive surveillance program.  Test performance has been validated by [facility] for patients greater than or equal to 53 year old. It is not intended to diagnose infection nor to guide or monitor treatment.     Radiology Studies: Dg Chest 2 View  Result Date: 06/04/2016 CLINICAL DATA:  Cough with chest pain. EXAM: CHEST  2 VIEW COMPARISON:  08/17/2014 FINDINGS: The cardio pericardial silhouette is enlarged. Prominence of the thoracic aorta raises the question of thoracic aortic aneurysm. The lungs are clear bilaterally. The visualized bony structures of the thorax are intact. IMPRESSION: 1. No acute cardiopulmonary findings. 2. Cardiomegaly with thoracic aortic prominence, unchanged in the interval, but raising concern for aneurysm. CT chest could be used to further evaluate as clinically warranted. Electronically Signed   By: Misty Stanley M.D.   On: 06/04/2016 18:46   Dg Cholangiogram Operative  Result Date: 06/06/2016 CLINICAL DATA:  Gallstones EXAM: INTRAOPERATIVE CHOLANGIOGRAM TECHNIQUE: Cholangiographic images from the C-arm  fluoroscopic device were submitted for interpretation post-operatively. Please see the procedural report for the amount of contrast and the fluoroscopy time utilized. COMPARISON:  None. FINDINGS: Contrast fills the biliary tree. There are several very small filling defects in the distal common bile duct. There is very slow transit of contrast into the duodenum. IMPRESSION: There are multiple small distal common bile duct stones resulting in partial obstruction of the distal common bile duct. Electronically Signed   By: Marybelle Killings M.D.   On: 06/06/2016 15:21   US Abdomen Complete  Result Date: 06/04/2016 CLINICAL DATA:  Midline abdominal pain radiating to central chest x1 week intermittently. Nausea intermittently. History of umbilical surgery years ago. EXAM: ABDOMEN ULTRASOUND COMPLETE COMPARISON:  CT report from 12/27/2000 FINDINGS: Gallbladder: The gallbladder is contracted. There is a gallstone in the body of the gallbladder measuring 2.6 x 1.3 x 2.1 cm. No sonographic Murphy's sign was elicited. Gallbladder wall thickness is top normal at 3 mm. Common bile duct: Diameter: Normal at 3.6 mm Liver: Slightly coarsened and increased in echogenicity without space-occupying mass or biliary dilatation. IVC: No abnormality visualized. Pancreas: There is an ovoid 1.3 x 0.4 cm area of hypoechogenicity along the ventral pancreatic head and body. Spleen: Size and appearance within normal limits. Right Kidney: Length: 9 cm. Echogenicity within normal limits. No mass or hydronephrosis visualized. Left Kidney: Length: 9.9 cm. Echogenicity within normal limits. No mass or hydronephrosis visualized. Abdominal aorta: No aneurysm visualized. Other findings: None. IMPRESSION: Uncomplicated cholelithiasis with a gallstone measuring 2.6 x 1.3 x 2.1 cm noted. No secondary signs of acute cholecystitis. 1.3 x 0.4 cm of hypoechogenicity along the ventral pancreatic head and body, nonspecific in etiology. CT without and with IV  contrast may help for further correlation. No ductal dilatation is seen. i Electronically Signed   By: Ashley Royalty M.D.   On: 06/04/2016 21:07   Mr 3d Recon At Scanner  Result Date: 06/05/2016 CLINICAL DATA:  Cholelithiasis with elevated trans am an Ace levels and hyperbilirubinemia. EXAM: MRI ABDOMEN WITHOUT AND WITH CONTRAST (INCLUDING MRCP) TECHNIQUE: Multiplanar multisequence MR imaging of the abdomen was performed both before and after the administration of intravenous contrast. Heavily T2-weighted images of the biliary and pancreatic ducts were obtained, and three-dimensional MRCP images were rendered by post processing. CONTRAST:  15 cc MultiHance COMPARISON:  CT scans from 06/04/2016 FINDINGS: Despite efforts by the technologist and patient, motion artifact is present on today's exam and could not be eliminated. This reduces exam sensitivity and specificity. Lower chest: Mild cardiomegaly. Hepatobiliary: 2.4 cm in long axis gallstone in the gallbladder on  image 23/3, along with some dependent low T2 signal material in the gallbladder probably sludge, less likely tiny gallstones. No intrahepatic biliary dilatation. No extrahepatic biliary dilatation. No filling defect in the intrahepatic or extrahepatic bile ducts is identified. No abnormal hepatic enhancement or hepatic morphology identified. No significant gallbladder wall thickening or pericholecystic fluid. Pancreas: Prior hypoechogenicity along the ventral pancreatic head and body was raised as a concern on the prior ultrasound; there is no worrisome lesion in this vicinity on today' s MRI, or on the prior CT, and the sonographic appearance was likely spurious. Spleen:  Unremarkable Adrenals/Urinary Tract: Several tiny nonenhancing lesions in the kidneys favor small cysts. No worrisome renal enhancement. Stomach/Bowel: Nondistended stomach. No significant abnormality observed. Vascular/Lymphatic:  Unremarkable Other:  No supplemental non-categorized  findings. Musculoskeletal: There is a suggestion of grade 1 anterolisthesis at L4-5 on one of the scout images. IMPRESSION: 1. Gallstone noted in the gallbladder but no intrahepatic or extrahepatic biliary abnormality is identified. No focal lesion in the liver. 2. No pancreatic lesion observed. 3. Grade 1 anterolisthesis at L4-5. 4. Mild cardiomegaly. 5. An obstructive cause for the patient's elevated liver enzymes and elevated bilirubin is not identified. If workup for hepatocellular dysfunction is warranted, nuclear medicine hepatobiliary scan can sometimes be helpful. Electronically Signed   By: Van Clines M.D.   On: 06/05/2016 15:17   Mr Abdomen Mrcp Moise Boring Contast  Result Date: 06/05/2016 CLINICAL DATA:  Cholelithiasis with elevated trans am an Ace levels and hyperbilirubinemia. EXAM: MRI ABDOMEN WITHOUT AND WITH CONTRAST (INCLUDING MRCP) TECHNIQUE: Multiplanar multisequence MR imaging of the abdomen was performed both before and after the administration of intravenous contrast. Heavily T2-weighted images of the biliary and pancreatic ducts were obtained, and three-dimensional MRCP images were rendered by post processing. CONTRAST:  15 cc MultiHance COMPARISON:  CT scans from 06/04/2016 FINDINGS: Despite efforts by the technologist and patient, motion artifact is present on today's exam and could not be eliminated. This reduces exam sensitivity and specificity. Lower chest: Mild cardiomegaly. Hepatobiliary: 2.4 cm in long axis gallstone in the gallbladder on image 23/3, along with some dependent low T2 signal material in the gallbladder probably sludge, less likely tiny gallstones. No intrahepatic biliary dilatation. No extrahepatic biliary dilatation. No filling defect in the intrahepatic or extrahepatic bile ducts is identified. No abnormal hepatic enhancement or hepatic morphology identified. No significant gallbladder wall thickening or pericholecystic fluid. Pancreas: Prior hypoechogenicity  along the ventral pancreatic head and body was raised as a concern on the prior ultrasound; there is no worrisome lesion in this vicinity on today' s MRI, or on the prior CT, and the sonographic appearance was likely spurious. Spleen:  Unremarkable Adrenals/Urinary Tract: Several tiny nonenhancing lesions in the kidneys favor small cysts. No worrisome renal enhancement. Stomach/Bowel: Nondistended stomach. No significant abnormality observed. Vascular/Lymphatic:  Unremarkable Other:  No supplemental non-categorized findings. Musculoskeletal: There is a suggestion of grade 1 anterolisthesis at L4-5 on one of the scout images. IMPRESSION: 1. Gallstone noted in the gallbladder but no intrahepatic or extrahepatic biliary abnormality is identified. No focal lesion in the liver. 2. No pancreatic lesion observed. 3. Grade 1 anterolisthesis at L4-5. 4. Mild cardiomegaly. 5. An obstructive cause for the patient's elevated liver enzymes and elevated bilirubin is not identified. If workup for hepatocellular dysfunction is warranted, nuclear medicine hepatobiliary scan can sometimes be helpful. Electronically Signed   By: Van Clines M.D.   On: 06/05/2016 15:17   Ct Angio Chest Aorta W/cm &/or Wo/cm  Result  Date: 06/04/2016 CLINICAL DATA:  Chest, abdominal and back pain. EXAM: CT ANGIOGRAPHY CHEST, ABDOMEN AND PELVIS TECHNIQUE: Multidetector CT imaging through the chest, abdomen and pelvis was performed using the standard protocol during bolus administration of intravenous contrast. Multiplanar reconstructed images and MIPs were obtained and reviewed to evaluate the vascular anatomy. CONTRAST:  100 cc Isovue 370 IV COMPARISON:  Chest radiograph earlier this day. FINDINGS: CTA CHEST FINDINGS Cardiovascular: Thoracic aortic tortuosity without dissection or aneurysm. Maximal ascending aortic dimension 3.6 cm. Left vertebral artery arises directly from the aortic arch, a normal variant. No aortic hematoma. There is  scattered mild atherosclerosis. Mild multi chamber cardiomegaly. Mediastinum/Nodes: No mediastinal, hilar, or axillary adenopathy. Left axillary lymph nodes with normal fatty hila. No pericardial effusion. The esophagus is patulous. No focal abnormality in the visualized thyroid gland. Lungs/Pleura: Mild breathing motion artifact. Tiny calcified granuloma in the right lower lobe. Scattered linear atelectasis. No consolidation. No evidence pulmonary edema. No pleural fluid. Musculoskeletal: Multilevel degenerative change throughout the spine. There are no acute or suspicious osseous abnormalities. Review of the MIP images confirms the above findings. CTA ABDOMEN AND PELVIS FINDINGS VASCULAR Aorta: Normal caliber aorta without aneurysm, dissection, vasculitis or significant stenosis. Celiac: Patent without evidence of aneurysm, dissection, vasculitis or significant stenosis. SMA: Patent without evidence of aneurysm, dissection, vasculitis or significant stenosis. Renals: Both renal arteries are patent without evidence of aneurysm, dissection, vasculitis, fibromuscular dysplasia or significant stenosis. IMA: Patent without evidence of aneurysm, dissection, vasculitis or significant stenosis. Inflow: Patent without evidence of aneurysm, dissection, vasculitis or significant stenosis. Veins: No obvious venous abnormality within the limitations of this arterial phase study. Review of the MIP images confirms the above findings. NON-VASCULAR Hepatobiliary: Gallstone on ultrasound earlier this day is not visualized by CT. No abnormal gallbladder distention. Normal arterial phase appearance of the liver. Pancreas: No definite CT correlate with the hypo echogenicity adjacent of the ventral pancreatic head on ultrasound. No ductal dilatation or inflammation. Spleen: Normal arterial phase appearance. Adrenals/Urinary Tract: No adrenal nodule. No hydronephrosis or perinephric edema. Symmetric enhancement. Ureters are  decompressed. Urinary bladder is mildly distended, no wall thickening. Stomach/Bowel: Significant colonic redundancy. A few diverticula at the hepatic flexure. No colonic inflammation. No small bowel dilatation or wall thickening. Appendix is not visualized, surgically absent. Stomach is distended with ingested contents. Lymphatic: No abdominal or pelvic adenopathy. Reproductive: Status post hysterectomy. No adnexal masses. Other: No free air free fluid.  Fat containing umbilical hernia. Musculoskeletal: Degenerative change throughout the lumbar spine with predominant lower facet arthropathy. Anterolisthesis of L4 on L5 appears degenerative. There are no acute or suspicious osseous abnormalities. Review of the MIP images confirms the above findings. IMPRESSION: 1. No acute aortic abnormality. Tortuous thoracic aorta without dissection or aneurysm. 2. No acute abnormality in the chest, abdomen, or pelvis. 3. Patulous esophagus, can be seen with reflux. 4. No definite peripancreatic abnormality corresponding to the hypoechoic structure on ultrasound. Electronically Signed   By: Jeb Levering M.D.   On: 06/04/2016 23:14   Ct Angio Abd/pel W/ And/or W/o  Result Date: 06/04/2016 CLINICAL DATA:  Chest, abdominal and back pain. EXAM: CT ANGIOGRAPHY CHEST, ABDOMEN AND PELVIS TECHNIQUE: Multidetector CT imaging through the chest, abdomen and pelvis was performed using the standard protocol during bolus administration of intravenous contrast. Multiplanar reconstructed images and MIPs were obtained and reviewed to evaluate the vascular anatomy. CONTRAST:  100 cc Isovue 370 IV COMPARISON:  Chest radiograph earlier this day. FINDINGS: CTA CHEST FINDINGS Cardiovascular: Thoracic aortic tortuosity without  dissection or aneurysm. Maximal ascending aortic dimension 3.6 cm. Left vertebral artery arises directly from the aortic arch, a normal variant. No aortic hematoma. There is scattered mild atherosclerosis. Mild multi  chamber cardiomegaly. Mediastinum/Nodes: No mediastinal, hilar, or axillary adenopathy. Left axillary lymph nodes with normal fatty hila. No pericardial effusion. The esophagus is patulous. No focal abnormality in the visualized thyroid gland. Lungs/Pleura: Mild breathing motion artifact. Tiny calcified granuloma in the right lower lobe. Scattered linear atelectasis. No consolidation. No evidence pulmonary edema. No pleural fluid. Musculoskeletal: Multilevel degenerative change throughout the spine. There are no acute or suspicious osseous abnormalities. Review of the MIP images confirms the above findings. CTA ABDOMEN AND PELVIS FINDINGS VASCULAR Aorta: Normal caliber aorta without aneurysm, dissection, vasculitis or significant stenosis. Celiac: Patent without evidence of aneurysm, dissection, vasculitis or significant stenosis. SMA: Patent without evidence of aneurysm, dissection, vasculitis or significant stenosis. Renals: Both renal arteries are patent without evidence of aneurysm, dissection, vasculitis, fibromuscular dysplasia or significant stenosis. IMA: Patent without evidence of aneurysm, dissection, vasculitis or significant stenosis. Inflow: Patent without evidence of aneurysm, dissection, vasculitis or significant stenosis. Veins: No obvious venous abnormality within the limitations of this arterial phase study. Review of the MIP images confirms the above findings. NON-VASCULAR Hepatobiliary: Gallstone on ultrasound earlier this day is not visualized by CT. No abnormal gallbladder distention. Normal arterial phase appearance of the liver. Pancreas: No definite CT correlate with the hypo echogenicity adjacent of the ventral pancreatic head on ultrasound. No ductal dilatation or inflammation. Spleen: Normal arterial phase appearance. Adrenals/Urinary Tract: No adrenal nodule. No hydronephrosis or perinephric edema. Symmetric enhancement. Ureters are decompressed. Urinary bladder is mildly distended, no  wall thickening. Stomach/Bowel: Significant colonic redundancy. A few diverticula at the hepatic flexure. No colonic inflammation. No small bowel dilatation or wall thickening. Appendix is not visualized, surgically absent. Stomach is distended with ingested contents. Lymphatic: No abdominal or pelvic adenopathy. Reproductive: Status post hysterectomy. No adnexal masses. Other: No free air free fluid.  Fat containing umbilical hernia. Musculoskeletal: Degenerative change throughout the lumbar spine with predominant lower facet arthropathy. Anterolisthesis of L4 on L5 appears degenerative. There are no acute or suspicious osseous abnormalities. Review of the MIP images confirms the above findings. IMPRESSION: 1. No acute aortic abnormality. Tortuous thoracic aorta without dissection or aneurysm. 2. No acute abnormality in the chest, abdomen, or pelvis. 3. Patulous esophagus, can be seen with reflux. 4. No definite peripancreatic abnormality corresponding to the hypoechoic structure on ultrasound. Electronically Signed   By: Jeb Levering M.D.   On: 06/04/2016 23:14   Scheduled Meds: . [MAR Hold] aspirin EC  81 mg Oral Q breakfast  . [MAR Hold] carvedilol  6.25 mg Oral BID WC  . Chlorhexidine Gluconate Cloth  6 each Topical Once  . [MAR Hold] cycloSPORINE  1 drop Both Eyes Q12H  . [MAR Hold] famotidine  40 mg Oral BID  . [MAR Hold] loratadine  10 mg Oral Daily  . [MAR Hold] montelukast  10 mg Oral QHS  . [MAR Hold] spironolactone  25 mg Oral Daily   Continuous Infusions: . sodium chloride 1,000 mL (06/06/16 0136)  . lactated ringers      LOS: 1 day    Kerney Elbe, DO [facility] Pager [phone]  If 7PM-7AM, please contact night-coverage [email or web address] Password [email or web address] 06/06/2016, 4:06 PM

## 2016-06-07 DIAGNOSIS — K8050 Calculus of bile duct without cholangitis or cholecystitis without obstruction: Secondary | ICD-10-CM

## 2016-06-07 LAB — COMPREHENSIVE METABOLIC PANEL
ALT: 229 U/L — ABNORMAL HIGH (ref 14–54)
AST: 183 U/L — ABNORMAL HIGH (ref 15–41)
Albumin: 3.4 g/dL — ABNORMAL LOW (ref 3.5–5.0)
Alkaline Phosphatase: 114 U/L (ref 38–126)
Anion gap: 7 (ref 5–15)
BUN: 8 mg/dL (ref 6–20)
CO2: 23 mmol/L (ref 22–32)
Calcium: 8.6 mg/dL — ABNORMAL LOW (ref 8.9–10.3)
Chloride: 108 mmol/L (ref 101–111)
Creatinine, Ser: 0.72 mg/dL (ref 0.44–1.00)
GFR calc Af Amer: >60 mL/min (ref >60)
GFR calc non Af Amer: >60 mL/min (ref >60)
Glucose, Bld: 167 mg/dL — ABNORMAL HIGH (ref 65–99)
Potassium: 4 mmol/L (ref 3.5–5.1)
Sodium: 138 mmol/L (ref 135–145)
Total Bilirubin: 1.5 mg/dL — ABNORMAL HIGH (ref 0.3–1.2)
Total Protein: 6.1 g/dL — ABNORMAL LOW (ref 6.5–8.1)

## 2016-06-07 LAB — CBC WITH DIFFERENTIAL/PLATELET
Basophils Absolute: 0 10*3/uL (ref 0.0–0.1)
Basophils Relative: 0 %
Eosinophils Absolute: 0 10*3/uL (ref 0.0–0.7)
Eosinophils Relative: 0 %
HCT: 36.6 % (ref 36.0–46.0)
Hemoglobin: 11.8 g/dL — ABNORMAL LOW (ref 12.0–15.0)
Lymphocytes Relative: 12 %
Lymphs Abs: 1 10*3/uL (ref 0.7–4.0)
MCH: 23.1 pg — ABNORMAL LOW (ref 26.0–34.0)
MCHC: 32.2 g/dL (ref 30.0–36.0)
MCV: 71.6 fL — ABNORMAL LOW (ref 78.0–100.0)
Monocytes Absolute: 0.2 10*3/uL (ref 0.1–1.0)
Monocytes Relative: 2 %
Neutro Abs: 6.9 10*3/uL (ref 1.7–7.7)
Neutrophils Relative %: 86 %
Platelets: 120 10*3/uL — ABNORMAL LOW (ref 150–400)
RBC: 5.11 MIL/uL (ref 3.87–5.11)
RDW: 15.3 % (ref 11.5–15.5)
WBC: 8.1 10*3/uL (ref 4.0–10.5)

## 2016-06-07 LAB — PHOSPHORUS: Phosphorus: 2.1 mg/dL — ABNORMAL LOW (ref 2.5–4.6)

## 2016-06-07 LAB — MAGNESIUM: Magnesium: 1.4 mg/dL — ABNORMAL LOW (ref 1.7–2.4)

## 2016-06-07 MED ORDER — CYCLOSPORINE 0.05 % OP EMUL
1.0000 [drp] | Freq: Two times a day (BID) | OPHTHALMIC | Status: DC
Start: 2016-06-07 — End: 2016-06-09
  Administered 2016-06-07 – 2016-06-09 (×4): 1 [drp] via OPHTHALMIC
  Filled 2016-06-07 (×5): qty 1

## 2016-06-07 NOTE — Progress Notes (Signed)
PROGRESS NOTE    Monique [name]  [id number] DOB: December 27, 1940 DOA: 06/04/2016 PCP: Kathlene November, MD   Brief Narrative:  Monique [name] is a 75 y.o. female with medical history significant of HTN, HLD, and anemia; who presented with complaints of waxing and waning abdominal pain. Symptoms had been occurring over the last week. Patient describes the pain as achy and pressure like feeling. Pain radiated to her back ,mid to upper stomach, and chest. Nothing seemed to aggravate or alleviate symptoms. Unable to correlate pain onset with eating. Patient never had previous symptoms like this in the past. Denies having any fever, chills, sweats, vomiting, loss of consciousness, dysuria, palpitations, alcohol use, or shortness of breath. No previous history of heart issues or heart disease in her family. Patient had been evaluated at her PCPs office and was recommended to come to the emergency department for further evaluation. Surgery and GI following. Patient went to Surgery yesterday and had her Gallbladder removed. Surgery Showed Chronic Cholecystitis and a large number of stones in the CBD post operative IOC with some filling defects. GI following and patient to undergo ERCP today.  Assessment & Plan:   Principal Problem:   Cholelithiases Active Problems:   Essential hypertension   Allergic rhinitis   Anemia   GERD (gastroesophageal reflux disease)   Abdominal pain   Elevated LFTs   Elevated liver enzymes  Symptomatic Cholelithiases and Choledocholithiasis with Transaminitis and Hyperbilirubinemia s/p Laprascopic Cholecystectomy with Abnromal Intraoperative IOC s/p POD1 -Acute.Patient underwent full workup for her abdominal pain complaints which revealed no signs of PE or dissection. Ultrasound revealed cholelithiasis without acute cholecystitis. Lab work showed elevated AST, ALT, and total bilirubin. -General Surgery Consulted and GI Consulted -MRCP did not show biliary Abnormality;  Esophagram Cancelled by GI -Viral Hepatitis Serologies ordered by GI -General Surgery to take patient to surgery yesterday and patient had gall bladder removed. Surgical findings showed chronic cholecystitis and large number of stones in the CBD post op IOC showed several small distal filling defects -Patient's LFT's remain Elevated - AST 183 and ALT 229 -GI Following and Plan to ERCP today. Dr. Amedeo Plenty to do ERCP -C/w NPO -Pain Control with Morphine 1 mg IV q1hprn per Surgery; Zofrapn for N/V -C/w IVF of D5W 1/2 NS + 20 mEQ at 100 mL and Discontinue when Diet is Tolerated.  Anemia:  - Hemoglobin 12.7 on admission. - Continue to monitor as Hb/Hct went from 11.7/36.3 -> 11.8/36.6 - Repeat CBC in AM  Essential Hypertension  - BP Meds Held for Surgery  Allergic Rhinitis -Continue Diphenhydramine 25 mg po q6hprn, Fluticasone 50 mcg/ACT 1-2 Spray daily prn, Montelukast 10 mg po qhS, and Loratadine 10 mg po Daily  GERD -Continue Omeprazole 40 mg po qHS  DVT prophylaxis: Heparin 5,000 units sq (heparin Held 1400 for ERCP), SCDs Code Status: FULL CODE Family Communication: No Family present at Bedside Disposition Plan: To remain Inpatient at this time.  Consultants:   General Surgery  Gastroenterology  Procedures: MRCP; Laparoscopic Cholecystectomy with Intraoperative Cholangiogram; ERCP  Antimicrobials: IV Ciprofloxacin 400 mg for Surgical Prophylaxis  Subjective: Seen and examined this Am and states Abdominal Pain removed. Awaiting GI Evaluation for ERCP. No other complaints or concerns at this time.   Objective: Vitals:   06/06/16 2035 06/07/16 0230 06/07/16 0520 06/07/16 1000  BP: (!) 115/59 (!) 150/77 124/70 122/70  Pulse: 94 84 84 73  Resp: 18 18 16 18   Temp: 99.5 F (37.5 C) 98.4 F (36.9 C) 97.5 F (  36.4 C) 98.1 F (36.7 C)  TempSrc: Oral Oral Oral Oral  SpO2: 99% 99% 97% 96%  Weight:      Height:        Intake/Output Summary (Last 24 hours) at 06/07/16  1225 Last data filed at 06/07/16 1000  Gross per 24 hour  Intake          3343.33 ml  Output             2320 ml  Net          1023.33 ml   Filed Weights   06/04/16 1702 06/05/16 0112  Weight: 76.2 kg (168 lb) 74.4 kg (164 lb)   Examination: Physical Exam:  Constitutional: WN/WD, NAD and appears calm and comfortable Eyes: Lids and conjunctivae normal, sclerae anicteric  ENMT: External Ears, Nose appear normal. Grossly normal hearing.  Neck: Appears normal, supple, no cervical masses, normal ROM, no appreciable thyromegaly, no JVD Respiratory: Clear to auscultation bilaterally, no wheezing, rales, rhonchi or crackles. Normal respiratory effort and patient is not tachypenic. No accessory muscle use.  Cardiovascular: RRR, no murmurs / rubs / gallops. S1 and S2 auscultated. No extremity edema.  Abdomen: Soft, non-tender to palpation, non-distended. No masses palpated. No appreciable hepatosplenomegaly. Bowel sounds positive x4. Abdominal Incisions look C/D/I.  GU: Deferred. Musculoskeletal: No clubbing / cyanosis of digits/nails. No joint deformity upper and lower extremities. Normal strength and muscle tone.  Skin: No rashes, lesions, ulcers. No induration; Warm and dry.  Neurologic: CN 2-12 grossly intact with no focal deficits. Sensation intact in all 4 Extremities. Romberg sign cerebellar reflexes not assessed.  Psychiatric: Normal judgment and insight. Alert and oriented x 3. Normal mood and appropriate affect.   Data Reviewed: I have personally reviewed following labs and imaging studies  CBC:  Recent Labs Lab 06/04/16 1734 06/05/16 0432 06/06/16 0418 06/07/16 0505  WBC 7.3 8.1 4.5 8.1  NEUTROABS 4.5  --  2.2 6.9  HGB 12.7 12.4 11.7* 11.8*  HCT 39.7 38.5 36.3 36.6  MCV 71.4* 71.6* 72.0* 71.6*  PLT 159 135* 127* 123456*   Basic Metabolic Panel:  Recent Labs Lab 06/04/16 1734 06/05/16 0432 06/06/16 0418 06/07/16 0505  NA 141 139 142 138  K 4.0 3.8 3.5 4.0  CL 105  105 110 108  CO2 30 27 26 23   GLUCOSE 107* 103* 89 167*  BUN 12 11 8 8   CREATININE 0.69 0.81 0.76 0.72  CALCIUM 9.8 9.2 8.4* 8.6*  MG  --   --  1.6* 1.4*  PHOS  --   --  2.9 2.1*   GFR: Estimated Creatinine Clearance: 57.4 mL/min (by C-G formula based on SCr of 0.72 mg/dL). Liver Function Tests:  Recent Labs Lab 06/04/16 1734 06/05/16 0432 06/06/16 0418 06/07/16 0505  AST 297* 436* 199* 183*  ALT 138* 275* 228* 229*  ALKPHOS 66 99 109 114  BILITOT 1.6* 3.3* 2.8* 1.5*  PROT 6.8 6.3* 5.7* 6.1*  ALBUMIN 4.0 3.9 3.5 3.4*    Recent Labs Lab 06/04/16 1734  LIPASE 46   No results for input(s): AMMONIA in the last 168 hours. Coagulation Profile: No results for input(s): INR, PROTIME in the last 168 hours. Cardiac Enzymes:  Recent Labs Lab 06/04/16 1734 06/05/16 0432  TROPONINI <0.03 <0.03   BNP (last 3 results) No results for input(s): PROBNP in the last 8760 hours. HbA1C: No results for input(s): HGBA1C in the last 72 hours. CBG: No results for input(s): GLUCAP in the last 168 hours. Lipid  Profile: No results for input(s): CHOL, HDL, LDLCALC, TRIG, CHOLHDL, LDLDIRECT in the last 72 hours. Thyroid Function Tests: No results for input(s): TSH, T4TOTAL, FREET4, T3FREE, THYROIDAB in the last 72 hours. Anemia Panel: No results for input(s): VITAMINB12, FOLATE, FERRITIN, TIBC, IRON, RETICCTPCT in the last 72 hours. Sepsis Labs: No results for input(s): PROCALCITON, LATICACIDVEN in the last 168 hours.  Recent Results (from the past 240 hour(s))  Surgical pcr screen     Status: None   Collection Time: 06/05/16  2:41 AM  Result Value Ref Range Status   MRSA, PCR NEGATIVE NEGATIVE Final   Staphylococcus aureus NEGATIVE NEGATIVE Final    Comment:        The Xpert SA Assay (FDA approved for NASAL specimens in patients over 33 years of age), is one component of a comprehensive surveillance program.  Test performance has been validated by [facility] for patients  greater than or equal to 87 year old. It is not intended to diagnose infection nor to guide or monitor treatment.     Radiology Studies: Dg Cholangiogram Operative  Result Date: 06/06/2016 CLINICAL DATA:  Gallstones EXAM: INTRAOPERATIVE CHOLANGIOGRAM TECHNIQUE: Cholangiographic images from the C-arm fluoroscopic device were submitted for interpretation post-operatively. Please see the procedural report for the amount of contrast and the fluoroscopy time utilized. COMPARISON:  None. FINDINGS: Contrast fills the biliary tree. There are several very small filling defects in the distal common bile duct. There is very slow transit of contrast into the duodenum. IMPRESSION: There are multiple small distal common bile duct stones resulting in partial obstruction of the distal common bile duct. Electronically Signed   By: Marybelle Killings M.D.   On: 06/06/2016 15:21   Mr 3d Recon At Scanner  Result Date: 06/05/2016 CLINICAL DATA:  Cholelithiasis with elevated trans am an Ace levels and hyperbilirubinemia. EXAM: MRI ABDOMEN WITHOUT AND WITH CONTRAST (INCLUDING MRCP) TECHNIQUE: Multiplanar multisequence MR imaging of the abdomen was performed both before and after the administration of intravenous contrast. Heavily T2-weighted images of the biliary and pancreatic ducts were obtained, and three-dimensional MRCP images were rendered by post processing. CONTRAST:  15 cc MultiHance COMPARISON:  CT scans from 06/04/2016 FINDINGS: Despite efforts by the technologist and patient, motion artifact is present on today's exam and could not be eliminated. This reduces exam sensitivity and specificity. Lower chest: Mild cardiomegaly. Hepatobiliary: 2.4 cm in long axis gallstone in the gallbladder on image 23/3, along with some dependent low T2 signal material in the gallbladder probably sludge, less likely tiny gallstones. No intrahepatic biliary dilatation. No extrahepatic biliary dilatation. No filling defect in the  intrahepatic or extrahepatic bile ducts is identified. No abnormal hepatic enhancement or hepatic morphology identified. No significant gallbladder wall thickening or pericholecystic fluid. Pancreas: Prior hypoechogenicity along the ventral pancreatic head and body was raised as a concern on the prior ultrasound; there is no worrisome lesion in this vicinity on today' s MRI, or on the prior CT, and the sonographic appearance was likely spurious. Spleen:  Unremarkable Adrenals/Urinary Tract: Several tiny nonenhancing lesions in the kidneys favor small cysts. No worrisome renal enhancement. Stomach/Bowel: Nondistended stomach. No significant abnormality observed. Vascular/Lymphatic:  Unremarkable Other:  No supplemental non-categorized findings. Musculoskeletal: There is a suggestion of grade 1 anterolisthesis at L4-5 on one of the scout images. IMPRESSION: 1. Gallstone noted in the gallbladder but no intrahepatic or extrahepatic biliary abnormality is identified. No focal lesion in the liver. 2. No pancreatic lesion observed. 3. Grade 1 anterolisthesis at L4-5. 4.  Mild cardiomegaly. 5. An obstructive cause for the patient's elevated liver enzymes and elevated bilirubin is not identified. If workup for hepatocellular dysfunction is warranted, nuclear medicine hepatobiliary scan can sometimes be helpful. Electronically Signed   By: Van Clines M.D.   On: 06/05/2016 15:17   Mr Abdomen Mrcp Moise Boring Contast  Result Date: 06/05/2016 CLINICAL DATA:  Cholelithiasis with elevated trans am an Ace levels and hyperbilirubinemia. EXAM: MRI ABDOMEN WITHOUT AND WITH CONTRAST (INCLUDING MRCP) TECHNIQUE: Multiplanar multisequence MR imaging of the abdomen was performed both before and after the administration of intravenous contrast. Heavily T2-weighted images of the biliary and pancreatic ducts were obtained, and three-dimensional MRCP images were rendered by post processing. CONTRAST:  15 cc MultiHance COMPARISON:  CT scans  from 06/04/2016 FINDINGS: Despite efforts by the technologist and patient, motion artifact is present on today's exam and could not be eliminated. This reduces exam sensitivity and specificity. Lower chest: Mild cardiomegaly. Hepatobiliary: 2.4 cm in long axis gallstone in the gallbladder on image 23/3, along with some dependent low T2 signal material in the gallbladder probably sludge, less likely tiny gallstones. No intrahepatic biliary dilatation. No extrahepatic biliary dilatation. No filling defect in the intrahepatic or extrahepatic bile ducts is identified. No abnormal hepatic enhancement or hepatic morphology identified. No significant gallbladder wall thickening or pericholecystic fluid. Pancreas: Prior hypoechogenicity along the ventral pancreatic head and body was raised as a concern on the prior ultrasound; there is no worrisome lesion in this vicinity on today' s MRI, or on the prior CT, and the sonographic appearance was likely spurious. Spleen:  Unremarkable Adrenals/Urinary Tract: Several tiny nonenhancing lesions in the kidneys favor small cysts. No worrisome renal enhancement. Stomach/Bowel: Nondistended stomach. No significant abnormality observed. Vascular/Lymphatic:  Unremarkable Other:  No supplemental non-categorized findings. Musculoskeletal: There is a suggestion of grade 1 anterolisthesis at L4-5 on one of the scout images. IMPRESSION: 1. Gallstone noted in the gallbladder but no intrahepatic or extrahepatic biliary abnormality is identified. No focal lesion in the liver. 2. No pancreatic lesion observed. 3. Grade 1 anterolisthesis at L4-5. 4. Mild cardiomegaly. 5. An obstructive cause for the patient's elevated liver enzymes and elevated bilirubin is not identified. If workup for hepatocellular dysfunction is warranted, nuclear medicine hepatobiliary scan can sometimes be helpful. Electronically Signed   By: Van Clines M.D.   On: 06/05/2016 15:17   Scheduled Meds: .  ciprofloxacin  400 mg Intravenous Q12H  . heparin subcutaneous  5,000 Units Subcutaneous Q8H  . pantoprazole (PROTONIX) IV  40 mg Intravenous QHS   Continuous Infusions: . dextrose 5 % and 0.45 % NaCl with KCl 20 mEq/L 100 mL/hr at 06/07/16 0958    LOS: 2 days    Kerney Elbe, DO [facility] Pager [phone]  If 7PM-7AM, please contact night-coverage [email or web address] Password [email or web address] 06/07/2016, 12:25 PM

## 2016-06-07 NOTE — Progress Notes (Signed)
[facility]OSS [facility]OVER [facility] Subjective: Chart reviewed patient examined. Patient had a laparoscopic cholecystectomy with an intraoperative IOC done by Dr. Johnathan Hausen on 06/06/2016 for chronic cholecystitis and large number of stones in the IBD postop IOC showed some distal filling defects patient's LFTs have been trending upwards and patient's awaiting an ERCP today she has been nothing by mouth she denies having any abdominal pain nausea vomiting vomiting she is comfortable she does have some back pain.  Objective: Vital signs in last 24 hours: Temp:  [97.5 F (36.4 C)-99.6 F (37.6 C)] 98.1 F (36.7 C) (12/09 1000) Pulse Rate:  [73-96] 73 (12/09 1000) Resp:  [15-22] 18 (12/09 1000) BP: (114-165)/(59-88) 122/70 (12/09 1000) SpO2:  [96 %-100 %] 96 % (12/09 1000) FiO2 (%):  [2 %] 2 % (12/08 1800) Last BM Date: 06/06/16  Intake/Output from previous day: 12/08 0701 - 12/09 0700 In: 3580 [I.V.:3380; IV Piggyback:200] Out: 2060 [Urine:2050; Blood:10] Intake/Output this shift: Total I/O In: 0  Out: 260 [Urine:260]  General appearance: alert, cooperative, appears stated age, no distress and morbidly obese Resp: clear to auscultation bilaterally Cardio: regular rate and rhythm, S1, S2 normal, no murmur, click, rub or gallop GI: soft, mild diffuse tenderness with no gaurding, rebound or rigidity; bowel sounds normal; no masses, no organomegaly Extremities: extremities normal, atraumatic, no cyanosis or edema  Lab Results:  Recent Labs  06/05/16 0432 06/06/16 0418 06/07/16 0505  WBC 8.1 4.5 8.1  HGB 12.4 11.7* 11.8*  HCT 38.5 36.3 36.6  PLT 135* 127* 120*   BMET  Recent Labs  06/05/16 0432 06/06/16 0418 06/07/16 0505  NA 139 142 138  K 3.8 3.5 4.0  CL 105 110 108  CO2 27 26 23   GLUCOSE 103* 89 167*  BUN 11 8 8   CREATININE 0.81 0.76 0.72  CALCIUM 9.2 8.4* 8.6*   LFT  Recent Labs  06/07/16 0505  PROT 6.1*  ALBUMIN 3.4*  AST 183*  ALT 229*  ALKPHOS 114  BILITOT  1.5*   PT/INR No results for input(s): LABPROT, INR in the last 72 hours. Hepatitis Panel  Recent Labs  06/05/16 1708  HEPBSAG Negative  HCVAB <0.1  HEPAIGM Negative  HEPBIGM Negative   Studies/Results: Dg Cholangiogram Operative  Result Date: 06/06/2016 CLINICAL DATA:  Gallstones EXAM: INTRAOPERATIVE CHOLANGIOGRAM TECHNIQUE: Cholangiographic images from the C-arm fluoroscopic device were submitted for interpretation post-operatively. Please see the procedural report for the amount of contrast and the fluoroscopy time utilized. COMPARISON:  None. FINDINGS: Contrast fills the biliary tree. There are several very small filling defects in the distal common bile duct. There is very slow transit of contrast into the duodenum. IMPRESSION: There are multiple small distal common bile duct stones resulting in partial obstruction of the distal common bile duct. Electronically Signed   By: Marybelle Killings M.D.   On: 06/06/2016 15:21   Mr 3d Recon At Scanner  Result Date: 06/05/2016 CLINICAL DATA:  Cholelithiasis with elevated trans am an Ace levels and hyperbilirubinemia. EXAM: MRI ABDOMEN WITHOUT AND WITH CONTRAST (INCLUDING MRCP) TECHNIQUE: Multiplanar multisequence MR imaging of the abdomen was performed both before and after the administration of intravenous contrast. Heavily T2-weighted images of the biliary and pancreatic ducts were obtained, and three-dimensional MRCP images were rendered by post processing. CONTRAST:  15 cc MultiHance COMPARISON:  CT scans from 06/04/2016 FINDINGS: Despite efforts by the technologist and patient, motion artifact is present on today's exam and could not be eliminated. This reduces exam sensitivity and specificity. Lower chest: Mild  cardiomegaly. Hepatobiliary: 2.4 cm in long axis gallstone in the gallbladder on image 23/3, along with some dependent low T2 signal material in the gallbladder probably sludge, less likely tiny gallstones. No intrahepatic biliary  dilatation. No extrahepatic biliary dilatation. No filling defect in the intrahepatic or extrahepatic bile ducts is identified. No abnormal hepatic enhancement or hepatic morphology identified. No significant gallbladder wall thickening or pericholecystic fluid. Pancreas: Prior hypoechogenicity along the ventral pancreatic head and body was raised as a concern on the prior ultrasound; there is no worrisome lesion in this vicinity on today' s MRI, or on the prior CT, and the sonographic appearance was likely spurious. Spleen:  Unremarkable Adrenals/Urinary Tract: Several tiny nonenhancing lesions in the kidneys favor small cysts. No worrisome renal enhancement. Stomach/Bowel: Nondistended stomach. No significant abnormality observed. Vascular/Lymphatic:  Unremarkable Other:  No supplemental non-categorized findings. Musculoskeletal: There is a suggestion of grade 1 anterolisthesis at L4-5 on one of the scout images. IMPRESSION: 1. Gallstone noted in the gallbladder but no intrahepatic or extrahepatic biliary abnormality is identified. No focal lesion in the liver. 2. No pancreatic lesion observed. 3. Grade 1 anterolisthesis at L4-5. 4. Mild cardiomegaly. 5. An obstructive cause for the patient's elevated liver enzymes and elevated bilirubin is not identified. If workup for hepatocellular dysfunction is warranted, nuclear medicine hepatobiliary scan can sometimes be helpful. Electronically Signed   By: Van Clines M.D.   On: 06/05/2016 15:17   Mr Abdomen Mrcp Moise Boring Contast  Result Date: 06/05/2016 CLINICAL DATA:  Cholelithiasis with elevated trans am an Ace levels and hyperbilirubinemia. EXAM: MRI ABDOMEN WITHOUT AND WITH CONTRAST (INCLUDING MRCP) TECHNIQUE: Multiplanar multisequence MR imaging of the abdomen was performed both before and after the administration of intravenous contrast. Heavily T2-weighted images of the biliary and pancreatic ducts were obtained, and three-dimensional MRCP images were  rendered by post processing. CONTRAST:  15 cc MultiHance COMPARISON:  CT scans from 06/04/2016 FINDINGS: Despite efforts by the technologist and patient, motion artifact is present on today's exam and could not be eliminated. This reduces exam sensitivity and specificity. Lower chest: Mild cardiomegaly. Hepatobiliary: 2.4 cm in long axis gallstone in the gallbladder on image 23/3, along with some dependent low T2 signal material in the gallbladder probably sludge, less likely tiny gallstones. No intrahepatic biliary dilatation. No extrahepatic biliary dilatation. No filling defect in the intrahepatic or extrahepatic bile ducts is identified. No abnormal hepatic enhancement or hepatic morphology identified. No significant gallbladder wall thickening or pericholecystic fluid. Pancreas: Prior hypoechogenicity along the ventral pancreatic head and body was raised as a concern on the prior ultrasound; there is no worrisome lesion in this vicinity on today' s MRI, or on the prior CT, and the sonographic appearance was likely spurious. Spleen:  Unremarkable Adrenals/Urinary Tract: Several tiny nonenhancing lesions in the kidneys favor small cysts. No worrisome renal enhancement. Stomach/Bowel: Nondistended stomach. No significant abnormality observed. Vascular/Lymphatic:  Unremarkable Other:  No supplemental non-categorized findings. Musculoskeletal: There is a suggestion of grade 1 anterolisthesis at L4-5 on one of the scout images. IMPRESSION: 1. Gallstone noted in the gallbladder but no intrahepatic or extrahepatic biliary abnormality is identified. No focal lesion in the liver. 2. No pancreatic lesion observed. 3. Grade 1 anterolisthesis at L4-5. 4. Mild cardiomegaly. 5. An obstructive cause for the patient's elevated liver enzymes and elevated bilirubin is not identified. If workup for hepatocellular dysfunction is warranted, nuclear medicine hepatobiliary scan can sometimes be helpful. Electronically Signed   By:  Van Clines M.D.   On:  06/05/2016 15:17    Medications: I have reviewed the patient's current medications.  Assessment/Plan: 1) abnormal LFTs with a positive IOC after laparoscopic cholecystectomy wIll plan to  do ERCP. Dr. Teena Irani is on cal for ERCP.   LOS: 2 days   Akhila Mahnken 06/07/2016, 10:25 AM

## 2016-06-07 NOTE — Progress Notes (Signed)
1 Day Post-Op lap chole Subjective: Pt with positive IOC during lap chole.  GI aware and will assess today.  Pt doing well.  Hungry  Objective: Vital signs in last 24 hours: Temp:  [97.5 F (36.4 C)-99.6 F (37.6 C)] 97.5 F (36.4 C) (12/09 0520) Pulse Rate:  [81-96] 84 (12/09 0520) Resp:  [15-22] 16 (12/09 0520) BP: (114-165)/(59-88) 124/70 (12/09 0520) SpO2:  [97 %-100 %] 97 % (12/09 0520) FiO2 (%):  [2 %] 2 % (12/08 1800)   Intake/Output from previous day: 12/08 0701 - 12/09 0700 In: 3580 [I.V.:3380; IV Piggyback:200] Out: 2060 [Urine:2050; Blood:10] Intake/Output this shift: Total I/O In: -  Out: 260 [Urine:260]   General appearance: alert and cooperative GI: soft, non-distended  Incision: no significant drainage  Lab Results:   Recent Labs  06/06/16 0418 06/07/16 0505  WBC 4.5 8.1  HGB 11.7* 11.8*  HCT 36.3 36.6  PLT 127* 120*   BMET  Recent Labs  06/06/16 0418 06/07/16 0505  NA 142 138  K 3.5 4.0  CL 110 108  CO2 26 23  GLUCOSE 89 167*  BUN 8 8  CREATININE 0.76 0.72  CALCIUM 8.4* 8.6*   PT/INR No results for input(s): LABPROT, INR in the last 72 hours. ABG No results for input(s): PHART, HCO3 in the last 72 hours.  Invalid input(s): PCO2, PO2  MEDS, Scheduled . ciprofloxacin  400 mg Intravenous Q12H  . heparin subcutaneous  5,000 Units Subcutaneous Q8H  . pantoprazole (PROTONIX) IV  40 mg Intravenous QHS    Studies/Results: Dg Cholangiogram Operative  Result Date: 06/06/2016 CLINICAL DATA:  Gallstones EXAM: INTRAOPERATIVE CHOLANGIOGRAM TECHNIQUE: Cholangiographic images from the C-arm fluoroscopic device were submitted for interpretation post-operatively. Please see the procedural report for the amount of contrast and the fluoroscopy time utilized. COMPARISON:  None. FINDINGS: Contrast fills the biliary tree. There are several very small filling defects in the distal common bile duct. There is very slow transit of contrast into the  duodenum. IMPRESSION: There are multiple small distal common bile duct stones resulting in partial obstruction of the distal common bile duct. Electronically Signed   By: Marybelle Killings M.D.   On: 06/06/2016 15:21   Mr 3d Recon At Scanner  Result Date: 06/05/2016 CLINICAL DATA:  Cholelithiasis with elevated trans am an Ace levels and hyperbilirubinemia. EXAM: MRI ABDOMEN WITHOUT AND WITH CONTRAST (INCLUDING MRCP) TECHNIQUE: Multiplanar multisequence MR imaging of the abdomen was performed both before and after the administration of intravenous contrast. Heavily T2-weighted images of the biliary and pancreatic ducts were obtained, and three-dimensional MRCP images were rendered by post processing. CONTRAST:  15 cc MultiHance COMPARISON:  CT scans from 06/04/2016 FINDINGS: Despite efforts by the technologist and patient, motion artifact is present on today's exam and could not be eliminated. This reduces exam sensitivity and specificity. Lower chest: Mild cardiomegaly. Hepatobiliary: 2.4 cm in long axis gallstone in the gallbladder on image 23/3, along with some dependent low T2 signal material in the gallbladder probably sludge, less likely tiny gallstones. No intrahepatic biliary dilatation. No extrahepatic biliary dilatation. No filling defect in the intrahepatic or extrahepatic bile ducts is identified. No abnormal hepatic enhancement or hepatic morphology identified. No significant gallbladder wall thickening or pericholecystic fluid. Pancreas: Prior hypoechogenicity along the ventral pancreatic head and body was raised as a concern on the prior ultrasound; there is no worrisome lesion in this vicinity on today' s MRI, or on the prior CT, and the sonographic appearance was likely spurious. Spleen:  Unremarkable  Adrenals/Urinary Tract: Several tiny nonenhancing lesions in the kidneys favor small cysts. No worrisome renal enhancement. Stomach/Bowel: Nondistended stomach. No significant abnormality observed.  Vascular/Lymphatic:  Unremarkable Other:  No supplemental non-categorized findings. Musculoskeletal: There is a suggestion of grade 1 anterolisthesis at L4-5 on one of the scout images. IMPRESSION: 1. Gallstone noted in the gallbladder but no intrahepatic or extrahepatic biliary abnormality is identified. No focal lesion in the liver. 2. No pancreatic lesion observed. 3. Grade 1 anterolisthesis at L4-5. 4. Mild cardiomegaly. 5. An obstructive cause for the patient's elevated liver enzymes and elevated bilirubin is not identified. If workup for hepatocellular dysfunction is warranted, nuclear medicine hepatobiliary scan can sometimes be helpful. Electronically Signed   By: Van Clines M.D.   On: 06/05/2016 15:17   Mr Abdomen Mrcp Moise Boring Contast  Result Date: 06/05/2016 CLINICAL DATA:  Cholelithiasis with elevated trans am an Ace levels and hyperbilirubinemia. EXAM: MRI ABDOMEN WITHOUT AND WITH CONTRAST (INCLUDING MRCP) TECHNIQUE: Multiplanar multisequence MR imaging of the abdomen was performed both before and after the administration of intravenous contrast. Heavily T2-weighted images of the biliary and pancreatic ducts were obtained, and three-dimensional MRCP images were rendered by post processing. CONTRAST:  15 cc MultiHance COMPARISON:  CT scans from 06/04/2016 FINDINGS: Despite efforts by the technologist and patient, motion artifact is present on today's exam and could not be eliminated. This reduces exam sensitivity and specificity. Lower chest: Mild cardiomegaly. Hepatobiliary: 2.4 cm in long axis gallstone in the gallbladder on image 23/3, along with some dependent low T2 signal material in the gallbladder probably sludge, less likely tiny gallstones. No intrahepatic biliary dilatation. No extrahepatic biliary dilatation. No filling defect in the intrahepatic or extrahepatic bile ducts is identified. No abnormal hepatic enhancement or hepatic morphology identified. No significant gallbladder wall  thickening or pericholecystic fluid. Pancreas: Prior hypoechogenicity along the ventral pancreatic head and body was raised as a concern on the prior ultrasound; there is no worrisome lesion in this vicinity on today' s MRI, or on the prior CT, and the sonographic appearance was likely spurious. Spleen:  Unremarkable Adrenals/Urinary Tract: Several tiny nonenhancing lesions in the kidneys favor small cysts. No worrisome renal enhancement. Stomach/Bowel: Nondistended stomach. No significant abnormality observed. Vascular/Lymphatic:  Unremarkable Other:  No supplemental non-categorized findings. Musculoskeletal: There is a suggestion of grade 1 anterolisthesis at L4-5 on one of the scout images. IMPRESSION: 1. Gallstone noted in the gallbladder but no intrahepatic or extrahepatic biliary abnormality is identified. No focal lesion in the liver. 2. No pancreatic lesion observed. 3. Grade 1 anterolisthesis at L4-5. 4. Mild cardiomegaly. 5. An obstructive cause for the patient's elevated liver enzymes and elevated bilirubin is not identified. If workup for hepatocellular dysfunction is warranted, nuclear medicine hepatobiliary scan can sometimes be helpful. Electronically Signed   By: Van Clines M.D.   On: 06/05/2016 15:17    Assessment: s/p Procedure(s): LAPAROSCOPIC CHOLECYSTECTOMY WITH INTRAOPERATIVE CHOLANGIOGRAM Patient Active Problem List   Diagnosis Date Noted  . Elevated liver enzymes   . Calculus of gallbladder without cholecystitis without obstruction 06/05/2016  . Anemia 06/05/2016  . GERD (gastroesophageal reflux disease) 06/05/2016  . Abdominal pain   . Elevated LFTs   . Cholelithiases 06/04/2016  . PCP NOTES >>>>>>>>>>>>>>>>>>>>>>>>> 02/04/2016  . Severe allergic reaction 08/17/2014  . Allergic rhinitis 12/01/2011  . Thrombocytopenia ([place]) 08/27/2011  . Hyperlipidemia 01/20/2011  . Annual physical exam 09/18/2010  . Pre-diabetes 09/17/2009  . Backache 07/10/2008  . ANXIETY  01/19/2008  . HX OF GALLSTONE 10/20/2006  .  Essential hypertension 09/29/2006      Plan: Await GI for evaluation for ERCP  Can start clears and advance diet if no ERCP planned   LOS: 2 days     .Rosario Adie, [facility], [place]   06/07/2016 9:35 AM

## 2016-06-07 NOTE — Progress Notes (Signed)
Received update regarding ERCP today. Reconciled Heparin SQ due this afternoon. See order to hold 1400 heparin dose.

## 2016-06-08 ENCOUNTER — Inpatient Hospital Stay (HOSPITAL_COMMUNITY): Payer: Medicare Other | Admitting: Anesthesiology

## 2016-06-08 ENCOUNTER — Encounter (HOSPITAL_COMMUNITY): Payer: Self-pay | Admitting: Anesthesiology

## 2016-06-08 ENCOUNTER — Inpatient Hospital Stay (HOSPITAL_COMMUNITY): Payer: Medicare Other

## 2016-06-08 ENCOUNTER — Encounter (HOSPITAL_COMMUNITY)
Admission: EM | Disposition: A | Discharge status: Home / Self Care | Payer: Self-pay | Source: Home / Self Care | Attending: Internal Medicine

## 2016-06-08 DIAGNOSIS — K8064 Calculus of gallbladder and bile duct with chronic cholecystitis without obstruction: Secondary | ICD-10-CM

## 2016-06-08 LAB — COMPREHENSIVE METABOLIC PANEL
ALT: 158 U/L — ABNORMAL HIGH (ref 14–54)
AST: 81 U/L — ABNORMAL HIGH (ref 15–41)
Albumin: 3.3 g/dL — ABNORMAL LOW (ref 3.5–5.0)
Alkaline Phosphatase: 93 U/L (ref 38–126)
Anion gap: 6 (ref 5–15)
BUN: 9 mg/dL (ref 6–20)
CO2: 25 mmol/L (ref 22–32)
Calcium: 8.2 mg/dL — ABNORMAL LOW (ref 8.9–10.3)
Chloride: 107 mmol/L (ref 101–111)
Creatinine, Ser: 0.77 mg/dL (ref 0.44–1.00)
GFR calc Af Amer: >60 mL/min (ref >60)
GFR calc non Af Amer: >60 mL/min (ref >60)
Glucose, Bld: 112 mg/dL — ABNORMAL HIGH (ref 65–99)
Potassium: 3.8 mmol/L (ref 3.5–5.1)
Sodium: 138 mmol/L (ref 135–145)
Total Bilirubin: 0.9 mg/dL (ref 0.3–1.2)
Total Protein: 5.6 g/dL — ABNORMAL LOW (ref 6.5–8.1)

## 2016-06-08 LAB — CBC WITH DIFFERENTIAL/PLATELET
Basophils Absolute: 0 10*3/uL (ref 0.0–0.1)
Basophils Relative: 0 %
Eosinophils Absolute: 0.1 10*3/uL (ref 0.0–0.7)
Eosinophils Relative: 1 %
HCT: 34.4 % — ABNORMAL LOW (ref 36.0–46.0)
Hemoglobin: 11 g/dL — ABNORMAL LOW (ref 12.0–15.0)
Lymphocytes Relative: 24 %
Lymphs Abs: 2.6 10*3/uL (ref 0.7–4.0)
MCH: 23.2 pg — ABNORMAL LOW (ref 26.0–34.0)
MCHC: 32 g/dL (ref 30.0–36.0)
MCV: 72.4 fL — ABNORMAL LOW (ref 78.0–100.0)
Monocytes Absolute: 0.7 10*3/uL (ref 0.1–1.0)
Monocytes Relative: 6 %
Neutro Abs: 7.6 10*3/uL (ref 1.7–7.7)
Neutrophils Relative %: 69 %
Platelets: 143 10*3/uL — ABNORMAL LOW (ref 150–400)
RBC: 4.75 MIL/uL (ref 3.87–5.11)
RDW: 15.8 % — ABNORMAL HIGH (ref 11.5–15.5)
WBC: 11 10*3/uL — ABNORMAL HIGH (ref 4.0–10.5)

## 2016-06-08 LAB — MAGNESIUM: Magnesium: 1.5 mg/dL — ABNORMAL LOW (ref 1.7–2.4)

## 2016-06-08 LAB — PHOSPHORUS: Phosphorus: 2.3 mg/dL — ABNORMAL LOW (ref 2.5–4.6)

## 2016-06-08 SURGERY — ERCP, WITH INTERVENTION IF INDICATED
Anesthesia: General

## 2016-06-08 MED ORDER — GLUCAGON HCL RDNA (DIAGNOSTIC) 1 MG IJ SOLR
INTRAMUSCULAR | Status: DC | PRN
  Administered 2016-06-08 (×2): .5 mg via INTRAVENOUS

## 2016-06-08 MED ORDER — IOPAMIDOL (ISOVUE-300) INJECTION 61%
INTRAVENOUS | Status: DC | PRN
  Administered 2016-06-08: 40 mL

## 2016-06-08 MED ORDER — ONDANSETRON HCL 4 MG/2ML IJ SOLN
INTRAMUSCULAR | Status: AC
  Filled 2016-06-08: qty 2

## 2016-06-08 MED ORDER — FENTANYL CITRATE (PF) 100 MCG/2ML IJ SOLN
INTRAMUSCULAR | Status: AC
  Filled 2016-06-08: qty 2

## 2016-06-08 MED ORDER — LIDOCAINE 2% (20 MG/ML) 5 ML SYRINGE
INTRAMUSCULAR | Status: DC | PRN
  Administered 2016-06-08: 100 mg via INTRAVENOUS

## 2016-06-08 MED ORDER — SUCCINYLCHOLINE CHLORIDE 200 MG/10ML IV SOSY
PREFILLED_SYRINGE | INTRAVENOUS | Status: AC
  Filled 2016-06-08: qty 10

## 2016-06-08 MED ORDER — PROPOFOL 10 MG/ML IV BOLUS
INTRAVENOUS | Status: AC
  Filled 2016-06-08: qty 20

## 2016-06-08 MED ORDER — FENTANYL CITRATE (PF) 100 MCG/2ML IJ SOLN
INTRAMUSCULAR | Status: DC | PRN
  Administered 2016-06-08 (×2): 50 ug via INTRAVENOUS

## 2016-06-08 MED ORDER — PROPOFOL 10 MG/ML IV BOLUS
INTRAVENOUS | Status: DC | PRN
  Administered 2016-06-08: 160 mg via INTRAVENOUS

## 2016-06-08 MED ORDER — LIDOCAINE 2% (20 MG/ML) 5 ML SYRINGE
INTRAMUSCULAR | Status: AC
  Filled 2016-06-08: qty 5

## 2016-06-08 MED ORDER — SUCCINYLCHOLINE CHLORIDE 200 MG/10ML IV SOSY
PREFILLED_SYRINGE | INTRAVENOUS | Status: DC | PRN
  Administered 2016-06-08: 120 mg via INTRAVENOUS

## 2016-06-08 MED ORDER — LACTATED RINGERS IV SOLN
INTRAVENOUS | Status: DC | PRN
  Administered 2016-06-08: 10:00:00 via INTRAVENOUS

## 2016-06-08 MED ORDER — ONDANSETRON HCL 4 MG/2ML IJ SOLN
INTRAMUSCULAR | Status: DC | PRN
Start: 2016-06-08 — End: 2016-06-08
  Administered 2016-06-08: 4 mg via INTRAVENOUS

## 2016-06-08 MED ORDER — GLUCAGON HCL RDNA (DIAGNOSTIC) 1 MG IJ SOLR
INTRAMUSCULAR | Status: AC
  Filled 2016-06-08: qty 1

## 2016-06-08 NOTE — Progress Notes (Signed)
CBG 121/ Did not transfer, Dr. Royce Macadamia informed with no new orders

## 2016-06-08 NOTE — Progress Notes (Signed)
Dr. Royce Macadamia, Anesthesiologist , aware of PACs.  EKG confirmed prior to surgery

## 2016-06-08 NOTE — Anesthesia Preprocedure Evaluation (Addendum)
Anesthesia Evaluation  Patient identified by MRN, date of birth, ID band Patient awake    Reviewed: Allergy & Precautions, NPO status , Patient's Chart, lab work & pertinent test results, reviewed documented beta blocker date and time   Airway Mallampati: II       Dental no notable dental hx.    Pulmonary neg pulmonary ROS,    Pulmonary exam normal breath sounds clear to auscultation       Cardiovascular hypertension, Pt. on medications and Pt. on home beta blockers  Rhythm:Irregular Rate:Normal     Neuro/Psych  Headaches, Anxiety Dry eyes syndrome    GI/Hepatic GERD  Medicated and Controlled,Choledocholithiasis Elevated LFT's Cholelithiasis- symptomatic S/P Lap cholecystectomy on 12/08    Endo/Other  diabetesObesity Pre Diabetes  Renal/GU negative Renal ROS  negative genitourinary   Musculoskeletal  (+) Arthritis ,   Abdominal (+) + obese,  Abdomen: tender.    Peds  Hematology  (+) anemia , Thrombocytopenia- mild   Anesthesia Other Findings   Reproductive/Obstetrics                             Chemistry      Component Value Date/Time   NA 138 06/08/2016 0446   K 3.8 06/08/2016 0446   CL 107 06/08/2016 0446   CO2 25 06/08/2016 0446   BUN 9 06/08/2016 0446   CREATININE 0.77 06/08/2016 0446      Component Value Date/Time   CALCIUM 8.2 (L) 06/08/2016 0446   ALKPHOS 93 06/08/2016 0446   AST 81 (H) 06/08/2016 0446   ALT 158 (H) 06/08/2016 0446   BILITOT 0.9 06/08/2016 0446     Lab Results  Component Value Date   WBC 11.0 (H) 06/08/2016   HGB 11.0 (L) 06/08/2016   HCT 34.4 (L) 06/08/2016   MCV 72.4 (L) 06/08/2016   PLT 143 (L) 06/08/2016   EKG: normal sinus rhythm, Q's II, II, aVF, possible old IWMI, Poor R wave progression V leads, Possible old AWMI, no acute changes.  Anesthesia Physical Anesthesia Plan  ASA: III  Anesthesia Plan: General   Post-op Pain  Management:    Induction: Intravenous  Airway Management Planned: Oral ETT  Additional Equipment:   Intra-op Plan:   Post-operative Plan: Extubation in OR  Informed Consent:   Dental advisory given  Plan Discussed with: Anesthesiologist, CRNA and Surgeon  Anesthesia Plan Comments: (12 lead EKG repeated in Holding area due to irregular rhythm on patient evaluation. EKG rhythm strip shows frequent PAC's otherwise unchanged from previous EKG. OK to proceed with ERCP.)       Anesthesia Quick Evaluation

## 2016-06-08 NOTE — Transfer of Care (Signed)
Immediate Anesthesia Transfer of Care Note  Patient: Monique [name]  Procedure(s) Performed: Procedure(s): ENDOSCOPIC RETROGRADE CHOLANGIOPANCREATOGRAPHY (ERCP) (N/A)  Patient Location: PACU  Anesthesia Type:General  Level of Consciousness: awake, alert  and oriented  Airway & Oxygen Therapy: Patient Spontanous Breathing and Patient connected to face mask oxygen  Post-op Assessment: Report given to RN and Post -op Vital signs reviewed and stable  Post vital signs: Reviewed and stable  Last Vitals:  Vitals:   06/08/16 0925 06/08/16 1130  BP: (!) 150/78 (!) 146/78  Pulse: 81 99  Resp: 18 13  Temp: 36.7 C 36.4 C    Last Pain:  Vitals:   06/08/16 1130  TempSrc: Axillary  PainSc:       Patients Stated Pain Goal: 2 ([id number])  Complications: No apparent anesthesia complications

## 2016-06-08 NOTE — Anesthesia Postprocedure Evaluation (Signed)
Anesthesia Post Note  Patient: Monique [name]  Procedure(s) Performed: Procedure(s) (LRB): ENDOSCOPIC RETROGRADE CHOLANGIOPANCREATOGRAPHY (ERCP) (N/A)  Patient location during evaluation: PACU Anesthesia Type: General Level of consciousness: awake and alert and oriented Pain management: pain level controlled Vital Signs Assessment: post-procedure vital signs reviewed and stable Respiratory status: spontaneous breathing, nonlabored ventilation and respiratory function stable Cardiovascular status: blood pressure returned to baseline and stable Postop Assessment: no signs of nausea or vomiting Anesthetic complications: no    Last Vitals:  Vitals:   06/08/16 1200 06/08/16 1215  BP: (!) 145/85 (!) 139/96  Pulse: 71 67  Resp: 15 16  Temp: 36.4 C     Last Pain:  Vitals:   06/08/16 1130  TempSrc: Axillary  PainSc:                  Llewyn Heap A.

## 2016-06-08 NOTE — Progress Notes (Signed)
Transfer of care to Gus Rankin, RN in PACU; report given

## 2016-06-08 NOTE — Op Note (Signed)
[facility] Patient Name: Monique [name] Procedure Date: 06/08/2016 MRN: [id number] Attending MD: Missy Sabins , MD Date of Birth: [birth year] CSN: [id number] Age: 75 Admit Type: Inpatient Procedure:                ERCP Indications:              Filling defect on intraoperative cholangiogram Providers:                Elyse Jarvis. Amedeo Plenty, MD, Dortha Schwalbe, RN, Elspeth Cho, Technician Referring MD:              Medicines:                General Anesthesia Complications:            No immediate complications. Estimated Blood Loss:     Estimated blood loss: none. Procedure:                Pre-Anesthesia Assessment:                           - The anesthesia plan was to use general anesthesia.                           After obtaining informed consent, the scope was                            passed under direct vision. Throughout the                            procedure, the patient's blood pressure, pulse, and                            oxygen saturations were monitored continuously. The                            [id number]) scope was introduced through                            the mouth, and used to inject contrast into and                            used to inject contrast into the bile duct. The                            ERCP was accomplished with ease. The patient                            tolerated the procedure well.                           The patient was placed in the supine position and                            was subsequently intubated  by the anesthesiologist.                            After that, patient?s position was changed to prone                            position and the duodenoscope was inserted into the                            oropharynx, advanced down to the esophagus, and                            into the stomach. The scope was then advanced                            through the pylorus to the  ampulla.The ampulla had                            normal appearance.                           The biliary orifice was identified. Selective                            biliary cannulation was performed with                            sphincterotome with easy cannulation of the biliary                            tree. Initial contrast injection was made. This                            allowed good visualization common bile duct, The                            common bile duct was not dilated measuring around 7                            mm. . There were no filling defects on                            cholangiogram. Once this was identified, successful                            Sphincterotomy was performed. Following this,                            clearance of the bile duct as was performed using a                            9 mm extraction balloon.small amount of sludge was  removed. Balloon occlusion cholangiograms appeared                            to demonstrated no filling defects. There was very                            good drainage that                           was observed. Guidewire was withdrawn. The stomach                            was then decompressed and the endoscope was                            withdrawn.                           Proctor Dr. Amedeo Plenty was present throughout the                            procedure. Scope In: Scope Out: Findings:      The major papilla was normal. The scout film was normal. nondilated CBD       without any filling defects. Small amount of sludge was removed with a 9       mm balloon sweep. PD was not injected or cannulated      The patient tolerated the procedure well. Impression:               - The major papilla appeared normal. nondilated CBD                            without any filling defects. Small amount of sludge                            was removed with a 9 mm balloon sweep. PD was not                             injected or cannulated Moderate Sedation:      general anesthesia Recommendation:           - Return patient to hospital ward for ongoing care.                           - Clear liquid diet.                           - No aspirin, ibuprofen, naproxen, or other                            non-steroidal anti-inflammatory drugs for 7 days.                           - LFts in AM Procedure Code(s):        --- Professional ---  [id number], Endoscopic retrograde                            cholangiopancreatography (ERCP); diagnostic,                            including collection of specimen(s) by brushing or                            washing, when performed (separate procedure) Diagnosis Code(s):        --- Professional ---                           R93.2, Abnormal findings on diagnostic imaging of                            liver and biliary tract CPT copyright 2016 [employer]. All rights reserved. The codes documented in this report are preliminary and upon coder review may  be revised to meet current compliance requirements. Missy Sabins, MD 06/08/2016 12:11:21 PM This report has been signed electronically. Number of Addenda: 0

## 2016-06-08 NOTE — Progress Notes (Signed)
2 Days Post-Op lap chole Subjective: Pt doing well after surgery.  Awaiting ERCP this am  Objective: Vital signs in last 24 hours: Temp:  [97.7 F (36.5 C)-98.8 F (37.1 C)] 98.8 F (37.1 C) (12/10 0514) Pulse Rate:  [73-88] 75 (12/10 0514) Resp:  [16-18] 16 (12/10 0514) BP: (121-141)/(60-76) 141/76 (12/10 0514) SpO2:  [96 %-99 %] 97 % (12/10 0514)   Intake/Output from previous day: 12/09 0701 - 12/10 0700 In: 2400 [I.V.:2400] Out: 1360 [Urine:1360] Intake/Output this shift: No intake/output data recorded.   General appearance: alert and cooperative GI: soft, non-distended  Incision: no significant drainage  Lab Results:   Recent Labs  06/07/16 0505 06/08/16 0446  WBC 8.1 11.0*  HGB 11.8* 11.0*  HCT 36.6 34.4*  PLT 120* 143*   BMET  Recent Labs  06/07/16 0505 06/08/16 0446  NA 138 138  K 4.0 3.8  CL 108 107  CO2 23 25  GLUCOSE 167* 112*  BUN 8 9  CREATININE 0.72 0.77  CALCIUM 8.6* 8.2*   PT/INR No results for input(s): LABPROT, INR in the last 72 hours. ABG No results for input(s): PHART, HCO3 in the last 72 hours.  Invalid input(s): PCO2, PO2  MEDS, Scheduled . ciprofloxacin  400 mg Intravenous Q12H  . cycloSPORINE  1 drop Both Eyes BID  . heparin subcutaneous  5,000 Units Subcutaneous Q8H  . pantoprazole (PROTONIX) IV  40 mg Intravenous QHS    Studies/Results: Dg Cholangiogram Operative  Result Date: 06/06/2016 CLINICAL DATA:  Gallstones EXAM: INTRAOPERATIVE CHOLANGIOGRAM TECHNIQUE: Cholangiographic images from the C-arm fluoroscopic device were submitted for interpretation post-operatively. Please see the procedural report for the amount of contrast and the fluoroscopy time utilized. COMPARISON:  None. FINDINGS: Contrast fills the biliary tree. There are several very small filling defects in the distal common bile duct. There is very slow transit of contrast into the duodenum. IMPRESSION: There are multiple small distal common bile duct  stones resulting in partial obstruction of the distal common bile duct. Electronically Signed   By: Marybelle Killings M.D.   On: 06/06/2016 15:21    Assessment: s/p Procedure(s): LAPAROSCOPIC CHOLECYSTECTOMY WITH INTRAOPERATIVE CHOLANGIOGRAM Patient Active Problem List   Diagnosis Date Noted  . Elevated liver enzymes   . Calculus of gallbladder without cholecystitis without obstruction 06/05/2016  . Anemia 06/05/2016  . GERD (gastroesophageal reflux disease) 06/05/2016  . Abdominal pain   . Elevated LFTs   . Cholelithiases 06/04/2016  . PCP NOTES >>>>>>>>>>>>>>>>>>>>>>>>> 02/04/2016  . Severe allergic reaction 08/17/2014  . Allergic rhinitis 12/01/2011  . Thrombocytopenia ([place]) 08/27/2011  . Hyperlipidemia 01/20/2011  . Annual physical exam 09/18/2010  . Pre-diabetes 09/17/2009  . Backache 07/10/2008  . ANXIETY 01/19/2008  . HX OF GALLSTONE 10/20/2006  . Essential hypertension 09/29/2006      Plan: ERCP today Advance diet as tolerated afterwards   LOS: 3 days     .Rosario Adie, [facility], [place]   06/08/2016 8:44 AM

## 2016-06-08 NOTE — Anesthesia Procedure Notes (Signed)
Procedure Name: Intubation Date/Time: 06/08/2016 10:30 AM Performed by: Noralyn Pick D Pre-anesthesia Checklist: Patient identified, Emergency Drugs available, Suction available and Patient being monitored Patient Re-evaluated:Patient Re-evaluated prior to inductionOxygen Delivery Method: Circle system utilized Preoxygenation: Pre-oxygenation with 100% oxygen Intubation Type: IV induction, Rapid sequence and Cricoid Pressure applied Laryngoscope Size: Mac and 3 Grade View: Grade I Tube type: Oral Tube size: 7.0 mm Number of attempts: 1 Airway Equipment and Method: Stylet and Oral airway Placement Confirmation: ETT inserted through vocal cords under direct vision,  positive ETCO2 and breath sounds checked- equal and bilateral Secured at: 21 cm Tube secured with: Tape Dental Injury: Teeth and Oropharynx as per pre-operative assessment

## 2016-06-08 NOTE — Progress Notes (Signed)
Assumed care from GI Nurse and Noralyn Pick, CRNA

## 2016-06-08 NOTE — Brief Op Note (Signed)
06/04/2016 - 06/08/2016  11:33 AM  PATIENT:  Monique [name]  74 y.o. female  PRE-OPERATIVE DIAGNOSIS:  stones in common bile duct  POST-OPERATIVE DIAGNOSIS:  ercp sphincerotomy  PROCEDURE:  Procedure(s): ENDOSCOPIC RETROGRADE CHOLANGIOPANCREATOGRAPHY (ERCP) (N/A)  SURGEON:  Surgeon(s) and Role:    * Teena Irani, MD - Primary    * Otis Brace, MD - Assisting  Recommendations -------------------------- - Patient status post ERCP, sphincterotomy and balloon sweep with removal of sludge. No stones visualized. - No immediate complications seen. Pancreatic duct was not injected or cannulated. - Dr. Amedeo Plenty was present throughout the procedure. - Liquid diet today. LFTs tomorrow. - GI will follow   Otis Brace MD, [place] 06/08/2016, 11:34 AM  Pager [phone]  If no answer or after 5 PM call [phone]

## 2016-06-08 NOTE — Progress Notes (Signed)
PROGRESS NOTE    Monique [name]  [id number] DOB: [birth year] DOA: 06/04/2016 PCP: Kathlene November, MD   Brief Narrative:  Monique [name] is a 75 y.o. female with medical history significant of HTN, HLD, and anemia; who presented with complaints of waxing and waning abdominal pain. Symptoms had been occurring over the last week. Patient describes the pain as achy and pressure like feeling. Pain radiated to her back ,mid to upper stomach, and chest. Nothing seemed to aggravate or alleviate symptoms. Unable to correlate pain onset with eating. Patient never had previous symptoms like this in the past. Denies having any fever, chills, sweats, vomiting, loss of consciousness, dysuria, palpitations, alcohol use, or shortness of breath. No previous history of heart issues or heart disease in her family. Patient had been evaluated at her PCPs office and was recommended to come to the emergency department for further evaluation. Surgery and GI following. Patient went to Surgery 06/06/2016 and had her Gallbladder removed. Surgery Showed Chronic Cholecystitis and a large number of stones in the CBD post operative IOC with some filling defects. GI following and patient to undergo ERCP today. Unfortunately she was not able to go ERCP yesterday so she will have procedure this Am.   Assessment & Plan:   Principal Problem:   Cholelithiases Active Problems:   Essential hypertension   Allergic rhinitis   Anemia   GERD (gastroesophageal reflux disease)   Abdominal pain   Elevated LFTs   Elevated liver enzymes  Symptomatic Cholelithiases and Choledocholithiasis with Transaminitis and Hyperbilirubinemia s/p Laprascopic Cholecystectomy with Abnromal Intraoperative IOC s/p POD2 -Acute.Patient underwent full workup for her abdominal pain complaints which revealed no signs of PE or dissection. Ultrasound revealed cholelithiasis without acute cholecystitis. Lab work showed elevated AST, ALT, and total  bilirubin. -General Surgery Consulted and GI Consulted -MRCP did not show biliary Abnormality; Esophagram Cancelled by GI -Viral Hepatitis Serologies ordered by GI -General Surgery to take patient to surgery yesterday and patient had gall bladder removed. Surgical findings showed chronic cholecystitis and large number of stones in the CBD post op IOC showed several small distal filling defects -Patient's LFT's remain Elevated but trending down - AST 81 (183) and ALT 158 (229) -Patient had Mild Leukocytosis today at 11.0; Likely reactive -GI Following and Plan to ERCP today. Dr. Amedeo Plenty to do ERCP this AM -Advance Diet per GI and Surgery -Pain Control with Morphine 1 mg IV q1hprn per Surgery; Zofrapn for N/V -C/w IVF of D5W 1/2 NS + 20 mEQ at 100 mL and Discontinue when Diet is Tolerated.  Anemia:  - Hemoglobin 12.7 on admission. - Continue to monitor as Hb/Hct went from 11.7/36.3 -> 11.8/36.6 -> 11.0/34.4 - Repeat CBC in AM  Essential Hypertension - BP Meds Held for Surgery and ERCP - Continue to Monitor BP's and add medications back slowly  Allergic Rhinitis - Continue Diphenhydramine 25 mg po q6hprn, Fluticasone 50 mcg/ACT 1-2 Spray daily prn, Montelukast 10 mg po qhS, and Loratadine 10 mg po Daily  GERD -Continue Omeprazole 40 mg IV Daily  DVT prophylaxis: Heparin 5,000 units sq (heparin Held 1400 for ERCP), SCDs Code Status: FULL CODE Family Communication: No Family present at Bedside Disposition Plan: To remain Inpatient at this time.  Consultants:   General Surgery  Gastroenterology  Procedures: MRCP 06/05/2016; Laparoscopic Cholecystectomy with Intraoperative Cholangiogram12/01/2016; ERCP 06/08/2016  Antimicrobials: IV Ciprofloxacin 400 mg for Surgical Prophylaxis  Subjective: Seen and examined this Am and states she did not have ERCP yesterday and was placed  on Clear Liquid Diet. Patient to have ERCP this Am. No complaints this Am.  Objective: Vitals:   06/07/16  1800 06/07/16 2019 06/08/16 0514 06/08/16 0925  BP: 123/76 121/60 (!) 141/76 (!) 150/78  Pulse: 78 88 75 81  Resp: 17 16 16 18   Temp: 98.3 F (36.8 C) 97.7 F (36.5 C) 98.8 F (37.1 C) 98 F (36.7 C)  TempSrc: Oral Oral Oral Oral  SpO2: 99% 98% 97% 99%  Weight:      Height:        Intake/Output Summary (Last 24 hours) at 06/08/16 1112 Last data filed at 06/08/16 0600  Gross per 24 hour  Intake             2000 ml  Output             1100 ml  Net              900 ml   Filed Weights   06/04/16 1702 06/05/16 0112  Weight: 76.2 kg (168 lb) 74.4 kg (164 lb)   Examination: Physical Exam:  Constitutional: WN/WD, NAD and appears calm and comfortable Eyes: Lids and conjunctivae normal, sclerae anicteric  ENMT: External Ears, Nose appear normal. Grossly normal hearing.  Neck: Appears normal, supple, no cervical masses, normal ROM, no appreciable thyromegaly, no JVD Respiratory: Clear to auscultation bilaterally, no wheezing, rales, rhonchi or crackles. Normal respiratory effort and patient is not tachypenic. No accessory muscle use.  Cardiovascular: RRR, no murmurs / rubs / gallops. S1 and S2 auscultated. No extremity edema.  Abdomen: Soft, non-tender to palpation, non-distended. No masses palpated. No appreciable hepatosplenomegaly. Bowel sounds positive x4. Abdominal Incisions look C/D/I.  GU: Deferred. Musculoskeletal: No clubbing / cyanosis of digits/nails. No joint deformity upper and lower extremities. Normal strength and muscle tone.  Skin: No rashes, lesions, ulcers. No induration; Warm and dry.  Neurologic: CN 2-12 grossly intact with no focal deficits. Sensation intact in all 4 Extremities. Romberg sign cerebellar reflexes not assessed.  Psychiatric: Normal judgment and insight. Alert and oriented x 3. Normal mood and appropriate affect.   Data Reviewed: I have personally reviewed following labs and imaging studies  CBC:  Recent Labs Lab 06/04/16 1734 06/05/16 0432  06/06/16 0418 06/07/16 0505 06/08/16 0446  WBC 7.3 8.1 4.5 8.1 11.0*  NEUTROABS 4.5  --  2.2 6.9 7.6  HGB 12.7 12.4 11.7* 11.8* 11.0*  HCT 39.7 38.5 36.3 36.6 34.4*  MCV 71.4* 71.6* 72.0* 71.6* 72.4*  PLT 159 135* 127* 120* [id number]*   Basic Metabolic Panel:  Recent Labs Lab 06/04/16 1734 06/05/16 0432 06/06/16 0418 06/07/16 0505 06/08/16 0446  NA 141 139 142 138 138  K 4.0 3.8 3.5 4.0 3.8  CL 105 105 110 108 107  CO2 30 27 26 23 25   GLUCOSE 107* 103* 89 167* 112*  BUN 12 11 8 8 9   CREATININE 0.69 0.81 0.76 0.72 0.77  CALCIUM 9.8 9.2 8.4* 8.6* 8.2*  MG  --   --  1.6* 1.4* 1.5*  PHOS  --   --  2.9 2.1* 2.3*   GFR: Estimated Creatinine Clearance: 57.4 mL/min (by C-G formula based on SCr of 0.77 mg/dL). Liver Function Tests:  Recent Labs Lab 06/04/16 1734 06/05/16 0432 06/06/16 0418 06/07/16 0505 06/08/16 0446  AST 297* 436* 199* 183* 81*  ALT 138* 275* 228* 229* 158*  ALKPHOS 66 99 109 114 93  BILITOT 1.6* 3.3* 2.8* 1.5* 0.9  PROT 6.8 6.3* 5.7* 6.1* 5.6*  ALBUMIN  4.0 3.9 3.5 3.4* 3.3*    Recent Labs Lab 06/04/16 1734  LIPASE 46   No results for input(s): AMMONIA in the last 168 hours. Coagulation Profile: No results for input(s): INR, PROTIME in the last 168 hours. Cardiac Enzymes:  Recent Labs Lab 06/04/16 1734 06/05/16 0432  TROPONINI <0.03 <0.03   BNP (last 3 results) No results for input(s): PROBNP in the last 8760 hours. HbA1C: No results for input(s): HGBA1C in the last 72 hours. CBG: No results for input(s): GLUCAP in the last 168 hours. Lipid Profile: No results for input(s): CHOL, HDL, LDLCALC, TRIG, CHOLHDL, LDLDIRECT in the last 72 hours. Thyroid Function Tests: No results for input(s): TSH, T4TOTAL, FREET4, T3FREE, THYROIDAB in the last 72 hours. Anemia Panel: No results for input(s): VITAMINB12, FOLATE, FERRITIN, TIBC, IRON, RETICCTPCT in the last 72 hours. Sepsis Labs: No results for input(s): PROCALCITON, LATICACIDVEN in the last  168 hours.  Recent Results (from the past 240 hour(s))  Surgical pcr screen     Status: None   Collection Time: 06/05/16  2:41 AM  Result Value Ref Range Status   MRSA, PCR NEGATIVE NEGATIVE Final   Staphylococcus aureus NEGATIVE NEGATIVE Final    Comment:        The Xpert SA Assay (FDA approved for NASAL specimens in patients over 46 years of age), is one component of a comprehensive surveillance program.  Test performance has been validated by [facility] for patients greater than or equal to 52 year old. It is not intended to diagnose infection nor to guide or monitor treatment.     Radiology Studies: Dg Cholangiogram Operative  Result Date: 06/06/2016 CLINICAL DATA:  Gallstones EXAM: INTRAOPERATIVE CHOLANGIOGRAM TECHNIQUE: Cholangiographic images from the C-arm fluoroscopic device were submitted for interpretation post-operatively. Please see the procedural report for the amount of contrast and the fluoroscopy time utilized. COMPARISON:  None. FINDINGS: Contrast fills the biliary tree. There are several very small filling defects in the distal common bile duct. There is very slow transit of contrast into the duodenum. IMPRESSION: There are multiple small distal common bile duct stones resulting in partial obstruction of the distal common bile duct. Electronically Signed   By: Marybelle Killings M.D.   On: 06/06/2016 15:21   Scheduled Meds: . ciprofloxacin  400 mg Intravenous Q12H  . cycloSPORINE  1 drop Both Eyes BID  . heparin subcutaneous  5,000 Units Subcutaneous Q8H  . pantoprazole (PROTONIX) IV  40 mg Intravenous QHS   Continuous Infusions: . dextrose 5 % and 0.45 % NaCl with KCl 20 mEq/L 100 mL/hr at 06/08/16 0936    LOS: 3 days    Kerney Elbe, DO [facility] Pager [phone]  If 7PM-7AM, please contact night-coverage [email or web address] Password [email or web address] 06/08/2016, 11:12 AM

## 2016-06-09 ENCOUNTER — Encounter (HOSPITAL_COMMUNITY): Payer: Self-pay | Admitting: Surgery

## 2016-06-09 DIAGNOSIS — K8040 Calculus of bile duct with cholecystitis, unspecified, without obstruction: Secondary | ICD-10-CM

## 2016-06-09 LAB — CBC WITH DIFFERENTIAL/PLATELET
Basophils Absolute: 0 10*3/uL (ref 0.0–0.1)
Basophils Absolute: 0 10*3/uL (ref 0.0–0.1)
Basophils Relative: 0 %
Basophils Relative: 0 %
Eosinophils Absolute: 0.3 10*3/uL (ref 0.0–0.7)
Eosinophils Absolute: 0.3 10*3/uL (ref 0.0–0.7)
Eosinophils Relative: 5 %
Eosinophils Relative: 4 %
HCT: 39 % (ref 36.0–46.0)
HCT: 36.4 % (ref 36.0–46.0)
Hemoglobin: 12.4 g/dL (ref 12.0–15.0)
Hemoglobin: 11.4 g/dL — ABNORMAL LOW (ref 12.0–15.0)
Lymphocytes Relative: 33 %
Lymphocytes Relative: 28 %
Lymphs Abs: 2.2 10*3/uL (ref 0.7–4.0)
Lymphs Abs: 2 10*3/uL (ref 0.7–4.0)
MCH: 23.3 pg — ABNORMAL LOW (ref 26.0–34.0)
MCH: 23 pg — ABNORMAL LOW (ref 26.0–34.0)
MCHC: 31.8 g/dL (ref 30.0–36.0)
MCHC: 31.3 g/dL (ref 30.0–36.0)
MCV: 73.3 fL — ABNORMAL LOW (ref 78.0–100.0)
MCV: 73.4 fL — ABNORMAL LOW (ref 78.0–100.0)
Monocytes Absolute: 0.5 10*3/uL (ref 0.1–1.0)
Monocytes Absolute: 0.7 10*3/uL (ref 0.1–1.0)
Monocytes Relative: 7 %
Monocytes Relative: 10 %
Neutro Abs: 3.5 10*3/uL (ref 1.7–7.7)
Neutro Abs: 4.3 10*3/uL (ref 1.7–7.7)
Neutrophils Relative %: 54 %
Neutrophils Relative %: 58 %
Platelets: UNDETERMINED 10*3/uL (ref 150–400)
Platelets: 116 10*3/uL — ABNORMAL LOW (ref 150–400)
RBC: 5.32 MIL/uL — ABNORMAL HIGH (ref 3.87–5.11)
RBC: 4.96 MIL/uL (ref 3.87–5.11)
RDW: 16 % — ABNORMAL HIGH (ref 11.5–15.5)
RDW: 15.8 % — ABNORMAL HIGH (ref 11.5–15.5)
WBC: 6.5 10*3/uL (ref 4.0–10.5)
WBC: 7.3 10*3/uL (ref 4.0–10.5)

## 2016-06-09 LAB — PHOSPHORUS: Phosphorus: 2.2 mg/dL — ABNORMAL LOW (ref 2.5–4.6)

## 2016-06-09 LAB — COMPREHENSIVE METABOLIC PANEL
ALT: 127 U/L — ABNORMAL HIGH (ref 14–54)
AST: 57 U/L — ABNORMAL HIGH (ref 15–41)
Albumin: 3.5 g/dL (ref 3.5–5.0)
Alkaline Phosphatase: 102 U/L (ref 38–126)
Anion gap: 8 (ref 5–15)
BUN: 6 mg/dL (ref 6–20)
CO2: 21 mmol/L — ABNORMAL LOW (ref 22–32)
Calcium: 8.3 mg/dL — ABNORMAL LOW (ref 8.9–10.3)
Chloride: 108 mmol/L (ref 101–111)
Creatinine, Ser: 1.01 mg/dL — ABNORMAL HIGH (ref 0.44–1.00)
GFR calc Af Amer: >60 mL/min (ref >60)
GFR calc non Af Amer: 53 mL/min — ABNORMAL LOW (ref >60)
Glucose, Bld: 182 mg/dL — ABNORMAL HIGH (ref 65–99)
Potassium: 4.9 mmol/L (ref 3.5–5.1)
Sodium: 137 mmol/L (ref 135–145)
Total Bilirubin: 1.6 mg/dL — ABNORMAL HIGH (ref 0.3–1.2)
Total Protein: 5.9 g/dL — ABNORMAL LOW (ref 6.5–8.1)

## 2016-06-09 LAB — GLUCOSE, CAPILLARY: Glucose-Capillary: 129 mg/dL — ABNORMAL HIGH (ref 65–99)

## 2016-06-09 LAB — MAGNESIUM: Magnesium: 1.3 mg/dL — ABNORMAL LOW (ref 1.7–2.4)

## 2016-06-09 MED ORDER — MAGNESIUM SULFATE 2 GM/50ML IV SOLN
2.0000 g | Freq: Once | INTRAVENOUS | Status: AC
  Administered 2016-06-09: 2 g via INTRAVENOUS
  Filled 2016-06-09: qty 50

## 2016-06-09 MED ORDER — SODIUM CHLORIDE 0.9 % IV BOLUS (SEPSIS)
500.0000 mL | Freq: Once | INTRAVENOUS | Status: AC
  Administered 2016-06-09: 500 mL via INTRAVENOUS

## 2016-06-09 MED ORDER — HYDROCODONE-ACETAMINOPHEN 5-325 MG PO TABS: 1.0000 | ORAL_TABLET | ORAL | Status: DC | PRN

## 2016-06-09 MED ORDER — HYDROCODONE-ACETAMINOPHEN 5-325 MG PO TABS: 1.0000 | ORAL_TABLET | ORAL | 0 refills | Status: DC | PRN

## 2016-06-09 MED FILL — HYDROCODON-APAP 5-325: 5-325 | 2 days supply | Qty: 20 | Fill #0

## 2016-06-09 NOTE — Progress Notes (Signed)
     [facility] Progress Note  Chief Complaint:   Bile duct stones  Subjective: Feels well today, had normal BM, ambulating.   Objective:  Vital signs in last 24 hours: Temp:  [97.4 F (36.3 C)-99.1 F (37.3 C)] 99.1 F (37.3 C) (12/11 0415) Pulse Rate:  [61-99] 77 (12/11 0415) Resp:  [13-19] 18 (12/11 0415) BP: (135-148)/(66-96) 141/87 (12/11 0415) SpO2:  [93 %-100 %] 93 % (12/11 0415) Last BM Date: 06/08/16 General:   Alert, well-developed, black female  in NAD EENT:  Normal hearing, non icteric sclera, conjunctive pink.  Heart:  Regular rate and rhythm; no murmurs. no lower extremity edema Abdomen:  Soft, nondistended, nontender.  Normal bowel sounds, no masses felt. No hepatomegaly.    Neurologic:  Alert and  oriented x4;  grossly normal neurologically. Psych:  Alert and cooperative. Normal mood and affect.  Intake/Output from previous day: 12/10 0701 - 12/11 0700 In: 3660 [P.O.:360; I.V.:2700; IV Piggyback:600] Out: 2600 [Urine:2600] Intake/Output this shift: No intake/output data recorded.  Lab Results:  Recent Labs  06/07/16 0505 06/08/16 0446 06/09/16 0905  WBC 8.1 11.0* 6.5  HGB 11.8* 11.0* 12.4  HCT 36.6 34.4* 39.0  PLT 120* 143* PENDING   BMET  Recent Labs  06/07/16 0505 06/08/16 0446  NA 138 138  K 4.0 3.8  CL 108 107  CO2 23 25  GLUCOSE 167* 112*  BUN 8 9  CREATININE 0.72 0.77  CALCIUM 8.6* 8.2*   LFT  Recent Labs  06/08/16 0446  PROT 5.6*  ALBUMIN 3.3*  AST 81*  ALT 158*  ALKPHOS 93  BILITOT 0.9   Dg Ercp Biliary & Pancreatic Ducts  Result Date: 06/08/2016 CLINICAL DATA:  Potential choledocholithiasis. EXAM: ERCP TECHNIQUE: Multiple spot images obtained with the fluoroscopic device and submitted for interpretation post-procedure. COMPARISON:  Intraoperative cholangiogram during laparoscopic cholecystectomy -06/06/2016 FINDINGS: Fifteen spot intraoperative fluoroscopic images of the right upper abdominal quadrant  during ERCP are provided for review Initial image demonstrates an ERCP probe overlying the right upper abdominal quadrant. Surgical clips overlie the expected location of the gallbladder fossa. There is selective cannulation and opacification of the common bile duct. Subsequent images demonstrate opacification of the central aspect of the intrahepatic biliary tree as well as the residual cystic duct. There are no discrete filling defects within the opacified portion of the biliary tree. Subsequent images demonstrate insufflation of a balloon within the central / mid aspect of the CBD with subsequent presumed sweeping and biliary sphincterotomy. IMPRESSION: ERCP with biliary sweeping and presumed sphincterotomy as above. These images were submitted for radiologic interpretation only. Please see the procedural report for the amount of contrast and the fluoroscopy time utilized. Electronically Signed   By: Sandi Mariscal M.D.   On: 06/08/2016 12:46    Assessment / Plan:   75 yo female with chronic cholecystitis / cholecystitis and ccholedocholithiasis. She is s/p lap chole with +IOC. Underwent ERCP with sphincterotomy and removal of CBD sludge 12/10. LFTs continue to improve. She is stable for discharge from GI standpoint.  Principal Problem:   Cholelithiases Active Problems:   Essential hypertension   Allergic rhinitis   Anemia   GERD (gastroesophageal reflux disease)   Abdominal pain   Elevated LFTs   Elevated liver enzymes    LOS: 4 days   Tye Savoy  06/09/2016, 9:54 AM  Pager number [phone]

## 2016-06-09 NOTE — Care Management Important Message (Signed)
Important Message  Patient Details  Name: Monique [name] MRN: [id number] Date of Birth: 1941-01-24   Medicare Important Message Given:  Yes    Camillo Flaming 06/09/2016, 2:51 [place] Message  Patient Details  Name: Monique [name] MRN: [id number] Date of Birth: [birth year]   Medicare Important Message Given:  Yes    Camillo Flaming 06/09/2016, 2:51 PM

## 2016-06-09 NOTE — Discharge Instructions (Signed)
Please arrive at least 30 min before your appointment to complete your check in paperwork.  If you are unable to arrive 30 min prior to your appointment time we may have to cancel or reschedule you. ° °LAPAROSCOPIC SURGERY: POST OP INSTRUCTIONS  °1. DIET: Follow a light bland diet the first 24 hours after arrival home, such as soup, liquids, crackers, etc. Be sure to include lots of fluids daily. Avoid fast food or heavy meals as your are more likely to get nauseated. Eat a low fat the next few days after surgery.  °2. Take your usually prescribed home medications unless otherwise directed. °3. PAIN CONTROL:  °1. Pain is best controlled by a usual combination of three different methods TOGETHER:  °1. Ice/Heat °2. Over the counter pain medication °3. Prescription pain medication °2. Most patients will experience some swelling and bruising around the incisions. Ice packs or heating pads (30-60 minutes up to 6 times a day) will help. Use ice for the first few days to help decrease swelling and bruising, then switch to heat to help relax tight/sore spots and speed recovery. Some people prefer to use ice alone, heat alone, alternating between ice & heat. Experiment to what works for you. Swelling and bruising can take several weeks to resolve.  °3. It is helpful to take an over-the-counter pain medication regularly for the first few weeks. Choose one of the following that works best for you:  °1. Naproxen (Aleve, etc) Two 220mg tabs twice a day °2. Ibuprofen (Advil, etc) Three 200mg tabs four times a day (every meal & bedtime) °3. Acetaminophen (Tylenol, etc) 500-650mg four times a day (every meal & bedtime) °4. A prescription for pain medication (such as oxycodone, hydrocodone, etc) should be given to you upon discharge. Take your pain medication as prescribed.  °1. If you are having problems/concerns with the prescription medicine (does not control pain, nausea, vomiting, rash, itching, etc), please call us [phone]  [phone] to see if we need to switch you to a different pain medicine that will work better for you and/or control your side effect better. °2. If you need a refill on your pain medication, please contact your pharmacy. They will contact our office to request authorization. Prescriptions will not be filled after 5 pm or on week-ends. °4. Avoid getting constipated. Between the surgery and the pain medications, it is common to experience some constipation. Increasing fluid intake and taking a fiber supplement (such as Metamucil, Citrucel, FiberCon, MiraLax, etc) 1-2 times a day regularly will usually help prevent this problem from occurring. A mild laxative (prune juice, Milk of Magnesia, MiraLax, etc) should be taken according to package directions if there are no bowel movements after 48 hours.  °5. Watch out for diarrhea. If you have many loose bowel movements, simplify your diet to bland foods & liquids for a few days. Stop any stool softeners and decrease your fiber supplement. Switching to mild anti-diarrheal medications (Kayopectate, Pepto Bismol) can help. If this worsens or does not improve, please call us. °6. Wash / shower every day. You may shower over the dressings as they are waterproof. Continue to shower over incision(s) after the dressing is off. °7. Remove your waterproof bandages 5 days after surgery. You may leave the incision open to air. You may replace a dressing/Band-Aid to cover the incision for comfort if you wish.  °8. ACTIVITIES as tolerated:  °1. You may resume regular (light) daily activities beginning the next day--such as daily self-care, walking, climbing stairs--gradually   increasing activities as tolerated. If you can walk 30 minutes without difficulty, it is safe to try more intense activity such as jogging, treadmill, bicycling, low-impact aerobics, swimming, etc. 2. Save the most intensive and strenuous activity for last such as sit-ups, heavy lifting, contact sports, etc Refrain  from any heavy lifting or straining until you are off narcotics for pain control.  3. DO NOT PUSH THROUGH PAIN. Let pain be your guide: If it hurts to do something, don't do it. Pain is your body warning you to avoid that activity for another week until the pain goes down. 4. You may drive when you are no longer taking prescription pain medication, you can comfortably wear a seatbelt, and you can safely maneuver your car and apply brakes. 5. You may have sexual intercourse when it is comfortable.  9. FOLLOW UP in our office  1. Please call [facility] at [phone] to set up an appointment to see your surgeon in the office for a follow-up appointment approximately 2-3 weeks after your surgery. 2. Make sure that you call for this appointment the day you arrive home to insure a convenient appointment time.      10. IF YOU HAVE DISABILITY OR FAMILY LEAVE FORMS, BRING THEM TO THE               OFFICE FOR PROCESSING.   WHEN TO CALL us [phone]:  1. Poor pain control 2. Reactions / problems with new medications (rash/itching, nausea, etc)  3. Fever over 101.5 F (38.5 C) 4. Inability to urinate 5. Nausea and/or vomiting 6. Worsening swelling or bruising 7. Continued bleeding from incision. 8. Increased pain, redness, or drainage from the incision  The clinic staff is available to answer your questions during regular business hours (8:30am-5pm). Please dont hesitate to call and ask to speak to one of our nurses for clinical concerns.  If you have a medical emergency, go to the nearest emergency room or call 911.  A surgeon from [facility] is always on call at the [facility], [place], [place], [place], [place] [address] ?  MAIN: [phone] ? TOLL FREE: [phone] ?  FAX [phone]  [email or web address]  Low-Fat Diet for Pancreatitis or Gallbladder Conditions A low-fat diet can be helpful if you have pancreatitis or  a gallbladder condition. With these conditions, your pancreas and gallbladder have trouble digesting fats. A healthy eating plan with less fat will help rest your pancreas and gallbladder and reduce your symptoms. What do I need to know about this diet?  Eat a low-fat diet.  Reduce your fat intake to less than 20-30% of your total daily calories. This is less than 50-60 g of fat per day.  Remember that you need some fat in your diet. Ask your dietician what your daily goal should be.  Choose nonfat and low-fat healthy foods. Look for the words nonfat, low fat, or fat free.  As a guide, look on the label and choose foods with less than 3 g of fat per serving. Eat only one serving.  Avoid alcohol.  Do not smoke. If you need help quitting, talk with your health care provider.  Eat small frequent meals instead of three large heavy meals. What foods can I eat? Grains  Include healthy grains and starches such as potatoes, wheat bread, fiber-rich cereal, and brown rice. Choose whole grain options whenever possible. In adults, whole grains should account for 45-65%  of your daily calories. Fruits and Vegetables  Eat plenty of fruits and vegetables. Fresh fruits and vegetables add fiber to your diet. Meats and Other Protein Sources  Eat lean meat such as chicken and pork. Trim any fat off of meat before cooking it. Eggs, fish, and beans are other sources of protein. In adults, these foods should account for 10-35% of your daily calories. Dairy  Choose low-fat milk and dairy options. Dairy includes fat and protein, as well as calcium. Fats and Oils  Limit high-fat foods such as fried foods, sweets, baked goods, sugary drinks. Other  Creamy sauces and condiments, such as mayonnaise, can add extra fat. Think about whether or not you need to use them, or use smaller amounts or low fat options. What foods are not recommended?  High fat foods, such as:  [employer].  Ice cream.  [place]  toast.  Sweet rolls.  Pizza.  Cheese bread.  Foods covered with batter, butter, creamy sauces, or cheese.  Fried foods.  Sugary drinks and desserts.  Foods that cause gas or bloating This information is not intended to replace advice given to you by your health care provider. Make sure you discuss any questions you have with your health care provider. Document Released: 06/21/2013 Document Revised: 11/22/2015 Document Reviewed: 05/30/2013 Elsevier Interactive Patient Education  2017 [employer].

## 2016-06-09 NOTE — Progress Notes (Signed)
[facility] Progress Note  1 Day Post-Op  Subjective: Pain controlled. Denies fever, chills, nausea, vomiting. Tolerating clears. Having bowel function. Urinating without hesitancy. Ambulating.  Objective: Vital signs in last 24 hours: Temp:  [97.4 F (36.3 C)-99.1 F (37.3 C)] 99.1 F (37.3 C) (12/11 0415) Pulse Rate:  [61-99] 77 (12/11 0415) Resp:  [13-19] 18 (12/11 0415) BP: (135-150)/(66-96) 141/87 (12/11 0415) SpO2:  [93 %-100 %] 93 % (12/11 0415) Last BM Date: 06/08/16  Intake/Output from previous day: 12/10 0701 - 12/11 0700 In: 3660 [P.O.:360; I.V.:2700; IV Piggyback:600] Out: 2600 [Urine:2600] Intake/Output this shift: No intake/output data recorded.  PE: Gen:  Alert, NAD, pleasant Pulm:  CTA, no W/R/R Abd: Soft, NT/ND, +BS, incisions C/D/I, mild ecchymosis Ext:  No erythema or tenderness   Lab Results:   Recent Labs  06/07/16 0505 06/08/16 0446  WBC 8.1 11.0*  HGB 11.8* 11.0*  HCT 36.6 34.4*  PLT 120* 143*   Hepatic Function Latest Ref Rng & Units 06/08/2016 06/07/2016 06/06/2016  Total Protein 6.5 - 8.1 g/dL 5.6(L) 6.1(L) 5.7(L)  Albumin 3.5 - 5.0 g/dL 3.3(L) 3.4(L) 3.5  AST 15 - 41 U/L 81(H) 183(H) 199(H)  ALT 14 - 54 U/L 158(H) 229(H) 228(H)  Alk Phosphatase 38 - 126 U/L 93 114 109  Total Bilirubin 0.3 - 1.2 mg/dL 0.9 1.5(H) 2.8(H)  Bilirubin, Direct 0.0 - 0.3 mg/dL - - -   BMET  Recent Labs  06/07/16 0505 06/08/16 0446  NA 138 138  K 4.0 3.8  CL 108 107  CO2 23 25  GLUCOSE 167* 112*  BUN 8 9  CREATININE 0.72 0.77  CALCIUM 8.6* 8.2*   PT/INR No results for input(s): LABPROT, INR in the last 72 hours. CMP    Studies/Results: Dg Ercp Biliary & Pancreatic Ducts  Result Date: 06/08/2016 CLINICAL DATA:  Potential choledocholithiasis. EXAM: ERCP TECHNIQUE: Multiple spot images obtained with the fluoroscopic device and submitted for interpretation post-procedure. COMPARISON:  Intraoperative cholangiogram during laparoscopic  cholecystectomy -06/06/2016 FINDINGS: Fifteen spot intraoperative fluoroscopic images of the right upper abdominal quadrant during ERCP are provided for review Initial image demonstrates an ERCP probe overlying the right upper abdominal quadrant. Surgical clips overlie the expected location of the gallbladder fossa. There is selective cannulation and opacification of the common bile duct. Subsequent images demonstrate opacification of the central aspect of the intrahepatic biliary tree as well as the residual cystic duct. There are no discrete filling defects within the opacified portion of the biliary tree. Subsequent images demonstrate insufflation of a balloon within the central / mid aspect of the CBD with subsequent presumed sweeping and biliary sphincterotomy. IMPRESSION: ERCP with biliary sweeping and presumed sphincterotomy as above. These images were submitted for radiologic interpretation only. Please see the procedural report for the amount of contrast and the fluoroscopy time utilized. Electronically Signed   By: Sandi Mariscal M.D.   On: 06/08/2016 12:46    Anti-infectives: Anti-infectives    Start     Dose/Rate Route Frequency Ordered Stop   06/06/16 2200  ciprofloxacin (CIPRO) IVPB 400 mg     400 mg 200 mL/hr over 60 Minutes Intravenous Every 12 hours 06/06/16 1745     06/06/16 0600  ciprofloxacin (CIPRO) IVPB 400 mg     400 mg 200 mL/hr over 60 Minutes Intravenous On call to O.R. 06/05/16 1643 06/06/16 1400     Assessment/Plan S/P laparoscopic cholecystectomy w/ IOC 06/06/16 Dr. Hassell Done - IOC w/ filling defect S/P ERCP spincterotomy 06/08/16  Alessandra Bevels, MD  LFT's trending down  WBC 11.0 yesterday, repeat pending   Plan: advance to low fat diet as tolerated. Follow CMET and CBC from today.   stable for discharge from a surgical standpoint if tolerated soft diet and cleared by GI. Will need to follow up in the [place] office in 2-3 weeks. instructions provided.   LOS: 4 days     [facility] 06/09/2016, 8:42 AM Pager: [phone] Consults: [phone] Mon-Fri 7:00 am-4:30 pm Sat-Sun 7:00 am-11:30 am

## 2016-06-09 NOTE — Progress Notes (Signed)
Patient verbalized understanding of discharge instructions. Patient has prescription. Patient is stable at discharge.

## 2016-06-09 NOTE — Discharge Summary (Signed)
Physician Discharge Summary  OSHYN METRO [id number] DOB: 22-Apr-1941 DOA: 06/04/2016  PCP: Kathlene November, MD  Admit date: 06/04/2016 Discharge date: 06/09/2016  Admitted From: *Home Disposition:  Home  Recommendations for Outpatient Follow-up:  1. Follow up with PCP in 1-2 weeks 2. Follow up with General Surgery in 1-2 weeks 3. Please obtain BMP/CBC in one week 4. Outpatient GI Follow Up as needed  Home Health: No Equipment/Devices: None  Discharge Condition: Stable CODE STATUS: FULL CODE Diet recommendation: Heart Healthy / Carb Modified   Brief/Interim Summary: Monique Maready Donnellis a 75 y.o.femalewith medical history significant of HTN, HLD, and anemia; who presented with complaints of waxing and waning abdominal pain. Symptoms had been occurring over the last week. Patient describes the pain as achy and pressure like feeling. Pain radiated to her back ,mid to upper stomach,and chest. Nothing seemed to aggravate or alleviate symptoms. Unable to correlate pain onset with eating. Patient never had previous symptoms like this in the past. Denies having any fever, chills, sweats, vomiting, loss of consciousness, dysuria, palpitations, alcohol use, or shortness of breath. No previous history of heart issues or heart disease in her family. Patient had been evaluated at her PCPs office and was recommended to come to the emergency department for further evaluation. Surgery and GI following. Patient went to Surgery 06/06/2016 and had her Gallbladder removed. Surgery Showed Chronic Cholecystitis and a large number of stones in the CBD post operative IOC with some filling defects. GI following and patient to undergo ERCP today. Unfortunately she was not able to go ERCP 06/07/2016 so she had procedure 06/08/2016. This AM she was doing well and had no complaints and is medically stable to be D/C'd Home and follow up with General Surgery and PCP as an outpatient as she is tolerating her diet  well.  Discharge Diagnoses:  Principal Problem:   Cholelithiases Active Problems:   Essential hypertension   Allergic rhinitis   Anemia   GERD (gastroesophageal reflux disease)   Abdominal pain   Elevated LFTs   Elevated liver enzymes  Symptomatic Cholelithiases and Choledocholithiasis with Transaminitis and Hyperbilirubinemia s/p Laprascopic Cholecystectomy with Abnromal Intraoperative IOC s/p POD2 -Acute.Patient underwent full workup for her abdominal pain complaints which revealed no signs of PE or dissection. Ultrasound revealed cholelithiasis without acute cholecystitis. Lab work showed elevated AST, ALT, and total bilirubin. -General Surgery Consulted and GI Consulted -MRCP did not show biliary Abnormality; Esophagram Cancelled by GI -Viral Hepatitis Serologies ordered by GI - All Negative -General Surgery to take patient to surgery and patient had gall bladder removed. Surgical findings showed chronic cholecystitis and large number of stones in the CBD post op IOC showed several small distal filling defects -Patient's LFT's remain Elevated but trending down - AST 57 and ALT 127 -GI Followed and ERCP done: sphincterotomy and balloon sweep with removal of sludge. No stones visualized. -Advanced Diet per GI and Surgery; Tolerating Soft Diet well -Pain Control with Hydrocodone at D/C -Follow up with PCP and General Surgery at D/C  Anemia:  - Hemoglobin 12.7 on admission. - Continue to monitor as Hb/Hct went from 11.7/36.3 -> 11.8/36.6 -> 11.0/34.4 -> 11.4/36.4 - Repeat CBC as an Outpatient.   Hypomagnesemia -Patient's Mag level was 1.3 this AM -Repeleted with IV Mag Sulfate prior to D/C -Repeat Mag Level as an outpatient  Essential Hypertension -C/w Home BP Medications  Allergic Rhinitis - Continue Diphenhydramine 25 mg po q6hprn, Fluticasone 50 mcg/ACT 1-2 Spray daily prn, Montelukast 10 mg po qhS, and  Loratadine 10 mg po Daily  GERD -Continue Home Famotidine  Daily   Discharge Instructions  Discharge Instructions    Call MD for:  difficulty breathing, headache or visual disturbances    Complete by:  As directed    Call MD for:  persistant dizziness or light-headedness    Complete by:  As directed    Call MD for:  persistant nausea and vomiting    Complete by:  As directed    Call MD for:  redness, tenderness, or signs of infection (pain, swelling, redness, odor or green/yellow discharge around incision site)    Complete by:  As directed    Call MD for:  severe uncontrolled pain    Complete by:  As directed    Call MD for:  temperature >100.4    Complete by:  As directed    Diet - low sodium heart healthy    Complete by:  As directed    Discharge instructions    Complete by:  As directed    Follow up with PCP and with General Surgery as an Outpatient. Follow up with GI as needed. Take all medications as prescribed. If symptoms change or worsen please return to the ED for evaluation.   Increase activity slowly    Complete by:  As directed        Medication List    TAKE these medications   aspirin 81 MG tablet Take 81 mg by mouth daily with breakfast.   CALCIUM + D PO Take 2 tablets by mouth daily with lunch.   carvedilol 6.25 MG tablet Commonly known as:  COREG Take 1 tablet (6.25 mg total) by mouth 2 (two) times daily with a meal.   cetirizine 10 MG tablet Commonly known as:  ZYRTEC Take 1 tablet by mouth every other day.   cycloSPORINE 0.05 % ophthalmic emulsion Commonly known as:  RESTASIS Place 1 drop into both eyes every 12 (twelve) hours.   DELSYM 30 MG/5ML liquid Generic drug:  dextromethorphan Take 15 mg by mouth as needed for cough.   diphenhydrAMINE 25 MG tablet Commonly known as:  BENADRYL Take 25 mg by mouth every 6 (six) hours as needed for allergies. Reported on 08/06/2015   EPINEPHrine 0.3 mg/0.3 mL Soaj injection Commonly known as:  EPIPEN 2-PAK Inject 0.3 mLs (0.3 mg total) into the muscle once.    famotidine 20 MG tablet Commonly known as:  PEPCID Take 1 tablet (20 mg total) by mouth 2 (two) times daily. What changed:  when to take this   fluticasone 50 MCG/ACT nasal spray Commonly known as:  FLONASE Place 1-2 sprays into both nostrils daily as needed for allergies or rhinitis.   glucosamine-chondroitin 500-400 MG tablet Take 1 tablet by mouth 3 (three) times daily.   HYDROcodone-acetaminophen 5-325 MG tablet Commonly known as:  NORCO/VICODIN Take 1-2 tablets by mouth every 4 (four) hours as needed for moderate pain or severe pain.   montelukast 10 MG tablet Commonly known as:  SINGULAIR Take 1 tablet (10 mg total) by mouth at bedtime.   multivitamin with minerals Tabs tablet Take 1 tablet by mouth daily with breakfast.   spironolactone 25 MG tablet Commonly known as:  ALDACTONE Take 1 tablet (25 mg total) by mouth daily.   SYSTANE ULTRA 0.4-0.3 % Soln Generic drug:  Polyethyl Glycol-Propyl Glycol Place 1 drop into both eyes daily as needed (dry eyes).      Follow-up [facility], [place]. Go in 2  week(s).   Specialty:  General Surgery Why:  for post-operative follow up. Call to confirm appointment date/time. Contact information: [place], [place]. Call.   Specialty:  Internal Medicine Why:  Call within 1 week Contact information: [address] [place] [address] [phone]          Allergies  Allergen Reactions  . Bee Venom Anaphylaxis  . Doxycycline Hives and Itching  . Penicillins Itching, Nausea And Vomiting and Swelling    syncope  . Sertraline Other (See Comments)    Dry Mouth  . Azithromycin Itching and Rash  . Cerumenex [Trolamine] Itching and Rash  . Climara [Estradiol] Itching and Rash  . Nickel Rash   Consultations:  General Surgery  Gastroenterology  Procedures/Studies: Dg Chest 2 View  Result  Date: 06/04/2016 CLINICAL DATA:  Cough with chest pain. EXAM: CHEST  2 VIEW COMPARISON:  08/17/2014 FINDINGS: The cardio pericardial silhouette is enlarged. Prominence of the thoracic aorta raises the question of thoracic aortic aneurysm. The lungs are clear bilaterally. The visualized bony structures of the thorax are intact. IMPRESSION: 1. No acute cardiopulmonary findings. 2. Cardiomegaly with thoracic aortic prominence, unchanged in the interval, but raising concern for aneurysm. CT chest could be used to further evaluate as clinically warranted. Electronically Signed   By: Misty Stanley M.D.   On: 06/04/2016 18:46   Dg Cholangiogram Operative  Result Date: 06/06/2016 CLINICAL DATA:  Gallstones EXAM: INTRAOPERATIVE CHOLANGIOGRAM TECHNIQUE: Cholangiographic images from the C-arm fluoroscopic device were submitted for interpretation post-operatively. Please see the procedural report for the amount of contrast and the fluoroscopy time utilized. COMPARISON:  None. FINDINGS: Contrast fills the biliary tree. There are several very small filling defects in the distal common bile duct. There is very slow transit of contrast into the duodenum. IMPRESSION: There are multiple small distal common bile duct stones resulting in partial obstruction of the distal common bile duct. Electronically Signed   By: Marybelle Killings M.D.   On: 06/06/2016 15:21   US Abdomen Complete  Result Date: 06/04/2016 CLINICAL DATA:  Midline abdominal pain radiating to central chest x1 week intermittently. Nausea intermittently. History of umbilical surgery years ago. EXAM: ABDOMEN ULTRASOUND COMPLETE COMPARISON:  CT report from 12/27/2000 FINDINGS: Gallbladder: The gallbladder is contracted. There is a gallstone in the body of the gallbladder measuring 2.6 x 1.3 x 2.1 cm. No sonographic Murphy's sign was elicited. Gallbladder wall thickness is top normal at 3 mm. Common bile duct: Diameter: Normal at 3.6 mm Liver: Slightly coarsened and  increased in echogenicity without space-occupying mass or biliary dilatation. IVC: No abnormality visualized. Pancreas: There is an ovoid 1.3 x 0.4 cm area of hypoechogenicity along the ventral pancreatic head and body. Spleen: Size and appearance within normal limits. Right Kidney: Length: 9 cm. Echogenicity within normal limits. No mass or hydronephrosis visualized. Left Kidney: Length: 9.9 cm. Echogenicity within normal limits. No mass or hydronephrosis visualized. Abdominal aorta: No aneurysm visualized. Other findings: None. IMPRESSION: Uncomplicated cholelithiasis with a gallstone measuring 2.6 x 1.3 x 2.1 cm noted. No secondary signs of acute cholecystitis. 1.3 x 0.4 cm of hypoechogenicity along the ventral pancreatic head and body, nonspecific in etiology. CT without and with IV contrast may help for further correlation. No ductal dilatation is seen. i Electronically Signed   By: Ashley Royalty M.D.   On: 06/04/2016 21:07   Mr 3d Recon At  Scanner  Result Date: 06/05/2016 CLINICAL DATA:  Cholelithiasis with elevated trans am an Ace levels and hyperbilirubinemia. EXAM: MRI ABDOMEN WITHOUT AND WITH CONTRAST (INCLUDING MRCP) TECHNIQUE: Multiplanar multisequence MR imaging of the abdomen was performed both before and after the administration of intravenous contrast. Heavily T2-weighted images of the biliary and pancreatic ducts were obtained, and three-dimensional MRCP images were rendered by post processing. CONTRAST:  15 cc MultiHance COMPARISON:  CT scans from 06/04/2016 FINDINGS: Despite efforts by the technologist and patient, motion artifact is present on today's exam and could not be eliminated. This reduces exam sensitivity and specificity. Lower chest: Mild cardiomegaly. Hepatobiliary: 2.4 cm in long axis gallstone in the gallbladder on image 23/3, along with some dependent low T2 signal material in the gallbladder probably sludge, less likely tiny gallstones. No intrahepatic biliary dilatation. No  extrahepatic biliary dilatation. No filling defect in the intrahepatic or extrahepatic bile ducts is identified. No abnormal hepatic enhancement or hepatic morphology identified. No significant gallbladder wall thickening or pericholecystic fluid. Pancreas: Prior hypoechogenicity along the ventral pancreatic head and body was raised as a concern on the prior ultrasound; there is no worrisome lesion in this vicinity on today' s MRI, or on the prior CT, and the sonographic appearance was likely spurious. Spleen:  Unremarkable Adrenals/Urinary Tract: Several tiny nonenhancing lesions in the kidneys favor small cysts. No worrisome renal enhancement. Stomach/Bowel: Nondistended stomach. No significant abnormality observed. Vascular/Lymphatic:  Unremarkable Other:  No supplemental non-categorized findings. Musculoskeletal: There is a suggestion of grade 1 anterolisthesis at L4-5 on one of the scout images. IMPRESSION: 1. Gallstone noted in the gallbladder but no intrahepatic or extrahepatic biliary abnormality is identified. No focal lesion in the liver. 2. No pancreatic lesion observed. 3. Grade 1 anterolisthesis at L4-5. 4. Mild cardiomegaly. 5. An obstructive cause for the patient's elevated liver enzymes and elevated bilirubin is not identified. If workup for hepatocellular dysfunction is warranted, nuclear medicine hepatobiliary scan can sometimes be helpful. Electronically Signed   By: Van Clines M.D.   On: 06/05/2016 15:17   Dg Ercp Biliary & Pancreatic Ducts  Result Date: 06/08/2016 CLINICAL DATA:  Potential choledocholithiasis. EXAM: ERCP TECHNIQUE: Multiple spot images obtained with the fluoroscopic device and submitted for interpretation post-procedure. COMPARISON:  Intraoperative cholangiogram during laparoscopic cholecystectomy -06/06/2016 FINDINGS: Fifteen spot intraoperative fluoroscopic images of the right upper abdominal quadrant during ERCP are provided for review Initial image demonstrates  an ERCP probe overlying the right upper abdominal quadrant. Surgical clips overlie the expected location of the gallbladder fossa. There is selective cannulation and opacification of the common bile duct. Subsequent images demonstrate opacification of the central aspect of the intrahepatic biliary tree as well as the residual cystic duct. There are no discrete filling defects within the opacified portion of the biliary tree. Subsequent images demonstrate insufflation of a balloon within the central / mid aspect of the CBD with subsequent presumed sweeping and biliary sphincterotomy. IMPRESSION: ERCP with biliary sweeping and presumed sphincterotomy as above. These images were submitted for radiologic interpretation only. Please see the procedural report for the amount of contrast and the fluoroscopy time utilized. Electronically Signed   By: Sandi Mariscal M.D.   On: 06/08/2016 12:46   Mr Abdomen Mrcp Moise Boring Contast  Result Date: 06/05/2016 CLINICAL DATA:  Cholelithiasis with elevated trans am an Ace levels and hyperbilirubinemia. EXAM: MRI ABDOMEN WITHOUT AND WITH CONTRAST (INCLUDING MRCP) TECHNIQUE: Multiplanar multisequence MR imaging of the abdomen was performed both before and after the administration of intravenous contrast.  Heavily T2-weighted images of the biliary and pancreatic ducts were obtained, and three-dimensional MRCP images were rendered by post processing. CONTRAST:  15 cc MultiHance COMPARISON:  CT scans from 06/04/2016 FINDINGS: Despite efforts by the technologist and patient, motion artifact is present on today's exam and could not be eliminated. This reduces exam sensitivity and specificity. Lower chest: Mild cardiomegaly. Hepatobiliary: 2.4 cm in long axis gallstone in the gallbladder on image 23/3, along with some dependent low T2 signal material in the gallbladder probably sludge, less likely tiny gallstones. No intrahepatic biliary dilatation. No extrahepatic biliary dilatation. No filling  defect in the intrahepatic or extrahepatic bile ducts is identified. No abnormal hepatic enhancement or hepatic morphology identified. No significant gallbladder wall thickening or pericholecystic fluid. Pancreas: Prior hypoechogenicity along the ventral pancreatic head and body was raised as a concern on the prior ultrasound; there is no worrisome lesion in this vicinity on today' s MRI, or on the prior CT, and the sonographic appearance was likely spurious. Spleen:  Unremarkable Adrenals/Urinary Tract: Several tiny nonenhancing lesions in the kidneys favor small cysts. No worrisome renal enhancement. Stomach/Bowel: Nondistended stomach. No significant abnormality observed. Vascular/Lymphatic:  Unremarkable Other:  No supplemental non-categorized findings. Musculoskeletal: There is a suggestion of grade 1 anterolisthesis at L4-5 on one of the scout images. IMPRESSION: 1. Gallstone noted in the gallbladder but no intrahepatic or extrahepatic biliary abnormality is identified. No focal lesion in the liver. 2. No pancreatic lesion observed. 3. Grade 1 anterolisthesis at L4-5. 4. Mild cardiomegaly. 5. An obstructive cause for the patient's elevated liver enzymes and elevated bilirubin is not identified. If workup for hepatocellular dysfunction is warranted, nuclear medicine hepatobiliary scan can sometimes be helpful. Electronically Signed   By: Van Clines M.D.   On: 06/05/2016 15:17   Ct Angio Chest Aorta W/cm &/or Wo/cm  Result Date: 06/04/2016 CLINICAL DATA:  Chest, abdominal and back pain. EXAM: CT ANGIOGRAPHY CHEST, ABDOMEN AND PELVIS TECHNIQUE: Multidetector CT imaging through the chest, abdomen and pelvis was performed using the standard protocol during bolus administration of intravenous contrast. Multiplanar reconstructed images and MIPs were obtained and reviewed to evaluate the vascular anatomy. CONTRAST:  100 cc Isovue 370 IV COMPARISON:  Chest radiograph earlier this day. FINDINGS: CTA CHEST  FINDINGS Cardiovascular: Thoracic aortic tortuosity without dissection or aneurysm. Maximal ascending aortic dimension 3.6 cm. Left vertebral artery arises directly from the aortic arch, a normal variant. No aortic hematoma. There is scattered mild atherosclerosis. Mild multi chamber cardiomegaly. Mediastinum/Nodes: No mediastinal, hilar, or axillary adenopathy. Left axillary lymph nodes with normal fatty hila. No pericardial effusion. The esophagus is patulous. No focal abnormality in the visualized thyroid gland. Lungs/Pleura: Mild breathing motion artifact. Tiny calcified granuloma in the right lower lobe. Scattered linear atelectasis. No consolidation. No evidence pulmonary edema. No pleural fluid. Musculoskeletal: Multilevel degenerative change throughout the spine. There are no acute or suspicious osseous abnormalities. Review of the MIP images confirms the above findings. CTA ABDOMEN AND PELVIS FINDINGS VASCULAR Aorta: Normal caliber aorta without aneurysm, dissection, vasculitis or significant stenosis. Celiac: Patent without evidence of aneurysm, dissection, vasculitis or significant stenosis. SMA: Patent without evidence of aneurysm, dissection, vasculitis or significant stenosis. Renals: Both renal arteries are patent without evidence of aneurysm, dissection, vasculitis, fibromuscular dysplasia or significant stenosis. IMA: Patent without evidence of aneurysm, dissection, vasculitis or significant stenosis. Inflow: Patent without evidence of aneurysm, dissection, vasculitis or significant stenosis. Veins: No obvious venous abnormality within the limitations of this arterial phase study. Review of the MIP images  confirms the above findings. NON-VASCULAR Hepatobiliary: Gallstone on ultrasound earlier this day is not visualized by CT. No abnormal gallbladder distention. Normal arterial phase appearance of the liver. Pancreas: No definite CT correlate with the hypo echogenicity adjacent of the ventral  pancreatic head on ultrasound. No ductal dilatation or inflammation. Spleen: Normal arterial phase appearance. Adrenals/Urinary Tract: No adrenal nodule. No hydronephrosis or perinephric edema. Symmetric enhancement. Ureters are decompressed. Urinary bladder is mildly distended, no wall thickening. Stomach/Bowel: Significant colonic redundancy. A few diverticula at the hepatic flexure. No colonic inflammation. No small bowel dilatation or wall thickening. Appendix is not visualized, surgically absent. Stomach is distended with ingested contents. Lymphatic: No abdominal or pelvic adenopathy. Reproductive: Status post hysterectomy. No adnexal masses. Other: No free air free fluid.  Fat containing umbilical hernia. Musculoskeletal: Degenerative change throughout the lumbar spine with predominant lower facet arthropathy. Anterolisthesis of L4 on L5 appears degenerative. There are no acute or suspicious osseous abnormalities. Review of the MIP images confirms the above findings. IMPRESSION: 1. No acute aortic abnormality. Tortuous thoracic aorta without dissection or aneurysm. 2. No acute abnormality in the chest, abdomen, or pelvis. 3. Patulous esophagus, can be seen with reflux. 4. No definite peripancreatic abnormality corresponding to the hypoechoic structure on ultrasound. Electronically Signed   By: Jeb Levering M.D.   On: 06/04/2016 23:14   Ct Angio Abd/pel W/ And/or W/o  Result Date: 06/04/2016 CLINICAL DATA:  Chest, abdominal and back pain. EXAM: CT ANGIOGRAPHY CHEST, ABDOMEN AND PELVIS TECHNIQUE: Multidetector CT imaging through the chest, abdomen and pelvis was performed using the standard protocol during bolus administration of intravenous contrast. Multiplanar reconstructed images and MIPs were obtained and reviewed to evaluate the vascular anatomy. CONTRAST:  100 cc Isovue 370 IV COMPARISON:  Chest radiograph earlier this day. FINDINGS: CTA CHEST FINDINGS Cardiovascular: Thoracic aortic tortuosity  without dissection or aneurysm. Maximal ascending aortic dimension 3.6 cm. Left vertebral artery arises directly from the aortic arch, a normal variant. No aortic hematoma. There is scattered mild atherosclerosis. Mild multi chamber cardiomegaly. Mediastinum/Nodes: No mediastinal, hilar, or axillary adenopathy. Left axillary lymph nodes with normal fatty hila. No pericardial effusion. The esophagus is patulous. No focal abnormality in the visualized thyroid gland. Lungs/Pleura: Mild breathing motion artifact. Tiny calcified granuloma in the right lower lobe. Scattered linear atelectasis. No consolidation. No evidence pulmonary edema. No pleural fluid. Musculoskeletal: Multilevel degenerative change throughout the spine. There are no acute or suspicious osseous abnormalities. Review of the MIP images confirms the above findings. CTA ABDOMEN AND PELVIS FINDINGS VASCULAR Aorta: Normal caliber aorta without aneurysm, dissection, vasculitis or significant stenosis. Celiac: Patent without evidence of aneurysm, dissection, vasculitis or significant stenosis. SMA: Patent without evidence of aneurysm, dissection, vasculitis or significant stenosis. Renals: Both renal arteries are patent without evidence of aneurysm, dissection, vasculitis, fibromuscular dysplasia or significant stenosis. IMA: Patent without evidence of aneurysm, dissection, vasculitis or significant stenosis. Inflow: Patent without evidence of aneurysm, dissection, vasculitis or significant stenosis. Veins: No obvious venous abnormality within the limitations of this arterial phase study. Review of the MIP images confirms the above findings. NON-VASCULAR Hepatobiliary: Gallstone on ultrasound earlier this day is not visualized by CT. No abnormal gallbladder distention. Normal arterial phase appearance of the liver. Pancreas: No definite CT correlate with the hypo echogenicity adjacent of the ventral pancreatic head on ultrasound. No ductal dilatation or  inflammation. Spleen: Normal arterial phase appearance. Adrenals/Urinary Tract: No adrenal nodule. No hydronephrosis or perinephric edema. Symmetric enhancement. Ureters are decompressed. Urinary bladder is mildly distended,  no wall thickening. Stomach/Bowel: Significant colonic redundancy. A few diverticula at the hepatic flexure. No colonic inflammation. No small bowel dilatation or wall thickening. Appendix is not visualized, surgically absent. Stomach is distended with ingested contents. Lymphatic: No abdominal or pelvic adenopathy. Reproductive: Status post hysterectomy. No adnexal masses. Other: No free air free fluid.  Fat containing umbilical hernia. Musculoskeletal: Degenerative change throughout the lumbar spine with predominant lower facet arthropathy. Anterolisthesis of L4 on L5 appears degenerative. There are no acute or suspicious osseous abnormalities. Review of the MIP images confirms the above findings. IMPRESSION: 1. No acute aortic abnormality. Tortuous thoracic aorta without dissection or aneurysm. 2. No acute abnormality in the chest, abdomen, or pelvis. 3. Patulous esophagus, can be seen with reflux. 4. No definite peripancreatic abnormality corresponding to the hypoechoic structure on ultrasound. Electronically Signed   By: Jeb Levering M.D.   On: 06/04/2016 23:14   ERCP -Patient status post ERCP, sphincterotomy and balloon sweep with removal of sludge. No stones visualized. - No immediate complications seen. Pancreatic duct was not injected or cannulated.  Subjective: Seen and examined at bedside and was doing well. Had no active complaints or concerns and states pain was better. Tolerating diet well. Ready to go home.  Discharge Exam: Vitals:   06/09/16 0200 06/09/16 0415  BP: 135/72 (!) 141/87  Pulse: 74 77  Resp: 16 18  Temp: 99 F (37.2 C) 99.1 F (37.3 C)   Vitals:   06/08/16 1850 06/08/16 2157 06/09/16 0200 06/09/16 0415  BP: (!) 148/70 139/71 135/72 (!) 141/87   Pulse: 78 84 74 77  Resp: 17 18 16 18   Temp: 97.5 F (36.4 C) 98.4 F (36.9 C) 99 F (37.2 C) 99.1 F (37.3 C)  TempSrc: Oral Oral Oral Oral  SpO2: 100% 97% 97% 93%  Weight:      Height:       General: Pt is alert, awake, not in acute distress Cardiovascular: RRR, S1/S2 +, no rubs, no gallops Respiratory: CTA bilaterally, no wheezing, no rhonchi Abdominal: Soft, NT, ND, bowel sounds + Extremities: no edema, no cyanosis  The results of significant diagnostics from this hospitalization (including imaging, microbiology, ancillary and laboratory) are listed below for reference.    Microbiology: Recent Results (from the past 240 hour(s))  Surgical pcr screen     Status: None   Collection Time: 06/05/16  2:41 AM  Result Value Ref Range Status   MRSA, PCR NEGATIVE NEGATIVE Final   Staphylococcus aureus NEGATIVE NEGATIVE Final    Comment:        The Xpert SA Assay (FDA approved for NASAL specimens in patients over 71 years of age), is one component of a comprehensive surveillance program.  Test performance has been validated by [facility] for patients greater than or equal to 64 year old. It is not intended to diagnose infection nor to guide or monitor treatment.     Labs: BNP (last 3 results) No results for input(s): BNP in the last 8760 hours. Basic Metabolic Panel:  Recent Labs Lab 06/05/16 0432 06/06/16 0418 06/07/16 0505 06/08/16 0446 06/09/16 0905  NA 139 142 138 138 137  K 3.8 3.5 4.0 3.8 4.9  CL 105 110 108 107 108  CO2 27 26 23 25  21*  GLUCOSE 103* 89 167* 112* 182*  BUN 11 8 8 9 6   CREATININE 0.81 0.76 0.72 0.77 1.01*  CALCIUM 9.2 8.4* 8.6* 8.2* 8.3*  MG  --  1.6* 1.4* 1.5* 1.3*  PHOS  --  2.9 2.1* 2.3* 2.2*   Liver Function Tests:  Recent Labs Lab 06/05/16 0432 06/06/16 0418 06/07/16 0505 06/08/16 0446 06/09/16 0905  AST 436* 199* 183* 81* 57*  ALT 275* 228* 229* 158* 127*  ALKPHOS 99 109 114 93 102  BILITOT 3.3* 2.8* 1.5* 0.9 1.6*   PROT 6.3* 5.7* 6.1* 5.6* 5.9*  ALBUMIN 3.9 3.5 3.4* 3.3* 3.5    Recent Labs Lab 06/04/16 1734  LIPASE 46   No results for input(s): AMMONIA in the last 168 hours. CBC:  Recent Labs Lab 06/06/16 0418 06/07/16 0505 06/08/16 0446 06/09/16 0905 06/09/16 1206  WBC 4.5 8.1 11.0* 6.5 7.3  NEUTROABS 2.2 6.9 7.6 3.5 4.3  HGB 11.7* 11.8* 11.0* 12.4 11.4*  HCT 36.3 36.6 34.4* 39.0 36.4  MCV 72.0* 71.6* 72.4* 73.3* 73.4*  PLT 127* 120* 143* PLATELET CLUMPS NOTED ON SMEAR, UNABLE TO ESTIMATE 116*   Cardiac Enzymes:  Recent Labs Lab 06/04/16 1734 06/05/16 0432  TROPONINI <0.03 <0.03   BNP: Invalid input(s): POCBNP CBG:  Recent Labs Lab 06/08/16 1214  GLUCAP 129*   D-Dimer No results for input(s): DDIMER in the last 72 hours. Hgb A1c No results for input(s): HGBA1C in the last 72 hours. Lipid Profile No results for input(s): CHOL, HDL, LDLCALC, TRIG, CHOLHDL, LDLDIRECT in the last 72 hours. Thyroid function studies No results for input(s): TSH, T4TOTAL, T3FREE, THYROIDAB in the last 72 hours.  Invalid input(s): FREET3 Anemia work up No results for input(s): VITAMINB12, FOLATE, FERRITIN, TIBC, IRON, RETICCTPCT in the last 72 hours. Urinalysis    Component Value Date/Time   COLORURINE YELLOW 06/04/2016 1734   APPEARANCEUR CLEAR 06/04/2016 1734   LABSPEC 1.015 06/04/2016 1734   PHURINE 6.0 06/04/2016 1734   GLUCOSEU NEGATIVE 06/04/2016 1734   GLUCOSEU NEGATIVE 08/29/2013 1008   HGBUR NEGATIVE 06/04/2016 1734   HGBUR large 10/08/2009 0854   BILIRUBINUR NEGATIVE 06/04/2016 1734   KETONESUR NEGATIVE 06/04/2016 1734   PROTEINUR NEGATIVE 06/04/2016 1734   UROBILINOGEN 0.2 08/29/2013 1008   NITRITE NEGATIVE 06/04/2016 1734   LEUKOCYTESUR NEGATIVE 06/04/2016 1734   Sepsis Labs Invalid input(s): PROCALCITONIN,  WBC,  LACTICIDVEN Microbiology Recent Results (from the past 240 hour(s))  Surgical pcr screen     Status: None   Collection Time: 06/05/16  2:41 AM   Result Value Ref Range Status   MRSA, PCR NEGATIVE NEGATIVE Final   Staphylococcus aureus NEGATIVE NEGATIVE Final    Comment:        The Xpert SA Assay (FDA approved for NASAL specimens in patients over 7 years of age), is one component of a comprehensive surveillance program.  Test performance has been validated by [facility] for patients greater than or equal to 75 year old. It is not intended to diagnose infection nor to guide or monitor treatment.    Time coordinating discharge: Over 30 minutes  SIGNED:  Kerney Elbe, DO [facility] 06/09/2016, 1:30 PM Pager [phone]  If 7PM-7AM, please contact night-coverage [email or web address] Password [email or web address]

## 2016-06-10 ENCOUNTER — Telehealth: Payer: Self-pay | Admitting: Behavioral Health

## 2016-06-10 NOTE — Telephone Encounter (Signed)
Transition Care Management Follow-up Telephone Call  PCP: Kathlene November, MD  Admit date: 06/04/2016 Discharge date: 06/09/2016  Admitted From: *Home Disposition:  Home  Recommendations for Outpatient Follow-up:  1. Follow up with PCP in 1-2 weeks 2. Follow up with General Surgery in 1-2 weeks 3. Please obtain BMP/CBC in one week 4. Outpatient GI Follow Up as needed   How have you been since you were released from the hospital? Patient stated, "I'm doing pretty good, just recuperating right now."   Do you understand why you were in the hospital? yes   Do you understand the discharge instructions? yes   Where were you discharged to? Home; staying with sister for the next few days.   Items Reviewed:  Medications reviewed: yes  Allergies reviewed: yes  Dietary changes reviewed: yes, heart healthy, carb-modified diet.  Referrals reviewed: yes, Follow up with General Surgery in 1-2 weeks; Outpatient GI Follow Up as needed   Functional Questionnaire:   Activities of Daily Living (ADLs):   She states they are independent in the following: ambulation, bathing and hygiene, feeding, continence, grooming, toileting and dressing States they require assistance with the following: None   Any transportation issues/concerns?: no   Any patient concerns? no   Confirmed importance and date/time of follow-up visits scheduled yes, 06/13/16 at 11:30 AM.  Provider Appointment booked with Dr. Larose Kells.  Confirmed with patient if condition begins to worsen call PCP or go to the ER.  Patient was given the office number and encouraged to call back with question or concerns.  : yes

## 2016-06-13 ENCOUNTER — Encounter: Payer: Self-pay | Admitting: Internal Medicine

## 2016-06-13 ENCOUNTER — Ambulatory Visit (INDEPENDENT_AMBULATORY_CARE_PROVIDER_SITE_OTHER): Payer: Medicare Other | Admitting: Internal Medicine

## 2016-06-13 VITALS — BP 126/78 | HR 74 | Temp 98.2°F | Resp 14 | Ht 62.0 in | Wt 168.0 lb

## 2016-06-13 DIAGNOSIS — K810 Acute cholecystitis: Secondary | ICD-10-CM | POA: Diagnosis not present

## 2016-06-13 DIAGNOSIS — Z9049 Acquired absence of other specified parts of digestive tract: Secondary | ICD-10-CM

## 2016-06-13 LAB — COMPREHENSIVE METABOLIC PANEL
ALT: 46 U/L — ABNORMAL HIGH (ref 0–35)
AST: 22 U/L (ref 0–37)
Albumin: 4 g/dL (ref 3.5–5.2)
Alkaline Phosphatase: 97 U/L (ref 39–117)
BUN: 10 mg/dL (ref 6–23)
CO2: 34 mEq/L — ABNORMAL HIGH (ref 19–32)
Calcium: 9.9 mg/dL (ref 8.4–10.5)
Chloride: 101 mEq/L (ref 96–112)
Creatinine, Ser: 0.83 mg/dL (ref 0.40–1.20)
GFR: 85.97 mL/min (ref >60.00)
Glucose, Bld: 88 mg/dL (ref 70–99)
Potassium: 4.3 mEq/L (ref 3.5–5.1)
Sodium: 141 mEq/L (ref 135–145)
Total Bilirubin: 0.7 mg/dL (ref 0.2–1.2)
Total Protein: 6.4 g/dL (ref 6.0–8.3)

## 2016-06-13 LAB — CBC WITH DIFFERENTIAL/PLATELET
Basophils Absolute: 0 10*3/uL (ref 0.0–0.1)
Basophils Relative: 0.4 % (ref 0.0–3.0)
Eosinophils Absolute: 0.4 10*3/uL (ref 0.0–0.7)
Eosinophils Relative: 6 % — ABNORMAL HIGH (ref 0.0–5.0)
HCT: 39.9 % (ref 36.0–46.0)
Hemoglobin: 12.8 g/dL (ref 12.0–15.0)
Lymphocytes Relative: 22.7 % (ref 12.0–46.0)
Lymphs Abs: 1.7 10*3/uL (ref 0.7–4.0)
MCHC: 32 g/dL (ref 30.0–36.0)
MCV: 72.1 fl — ABNORMAL LOW (ref 78.0–100.0)
Monocytes Absolute: 0.5 10*3/uL (ref 0.1–1.0)
Monocytes Relative: 6.8 % (ref 3.0–12.0)
Neutro Abs: 4.7 10*3/uL (ref 1.4–7.7)
Neutrophils Relative %: 64.1 % (ref 43.0–77.0)
Platelets: 184 10*3/uL (ref 150.0–400.0)
RBC: 5.54 Mil/uL — ABNORMAL HIGH (ref 3.87–5.11)
RDW: 15.3 % (ref 11.5–15.5)
WBC: 7.4 10*3/uL (ref 4.0–10.5)

## 2016-06-13 LAB — MAGNESIUM: Magnesium: 1.3 mg/dL — ABNORMAL LOW (ref 1.5–2.5)

## 2016-06-13 NOTE — Progress Notes (Signed)
Subjective:    Patient ID: Monique [name], female    DOB: Apr 19, 1941, 75 y.o.   MRN: [id number]  DOS:  06/13/2016 Type of visit - description : TCM 7 Interval history: Admitted to hospital 06/04/2016, discharge 06/09/2016. She was admitted with cholelithiasis and choledocholithiasis with transaminitis, hyperbilirubinemia. GI performed on the MRCP, no clear abnormality. Hepatitis serologies negative Had surgical removal of the gallbladder, had stones in the CBD, after surgery GI perform an ERCP with sphincterotomy and swept for the removal of sludge  She was recommended to have a CBC, Mg level, CMP  Review of Systems Since she left the hospital, she is back home and doing okay. Still have some right-sided abdominal discomfort but is improving gradually. Denies fevers, few chills? No nausea or vomiting. Good by mouth tolerance, had a BM daily for the last couple of days without blood. Appetite is okay. No chest pain, difficulty breathing or lower extremity edema  Past Medical History:  Diagnosis Date  . Allergy   . Anemia    when had uterine fibroids  . Anxiety   . Cataract    very small  . Cholelithiasis   . DJD (degenerative joint disease)   . Dry eye syndrome   . Headache(784.0)   . Hyperlipidemia 01/20/2011  . Hypertension 09/29/2006  . Thrombocytopenia ([place]) 08/27/2011   hematology evaluation 08-2011, return prn  . Urticaria   . Vasomotor rhinitis     Past Surgical History:  Procedure Laterality Date  . ABDOMINAL HYSTERECTOMY     no oophorectomy  . APPENDECTOMY    . CHOLECYSTECTOMY N/A 06/06/2016   Procedure: LAPAROSCOPIC CHOLECYSTECTOMY WITH INTRAOPERATIVE CHOLANGIOGRAM;  Surgeon: Johnathan Hausen, MD;  Location: [facility];  Service: General;  Laterality: N/A;  . COLONOSCOPY    . DILATION AND CURETTAGE OF UTERUS    . mole removals    . TONSILLECTOMY    . UMBILICAL HERNIA REPAIR      Social History   Social History  . Marital status: Widowed    Spouse name:  N/A  . Number of children: 2  . Years of education: N/A   Occupational History  . 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 Smoking status: Never Smoker  . Smokeless tobacco: Never Used  . Alcohol use No  . Drug use: No  . Sexual activity: Not on file   Other Topics Concern  . Not on file   Social History Narrative   husband had prostate ca , passed away 10-17-2013   takes care of mom, 19 y/o who is blind , and two other fam members              Allergies as of 06/13/2016      Reactions   Bee Venom Anaphylaxis   Doxycycline Hives, Itching   Penicillins Itching, Nausea And Vomiting, Swelling   syncope   Sertraline Other (See Comments)   Dry Mouth   Azithromycin Itching, Rash   Cerumenex [trolamine] Itching, Rash   Climara [estradiol] Itching, Rash   Nickel Rash      Medication List       Accurate as of 06/13/16 11:59 PM. Always use your most recent med list.          aspirin 81 MG tablet Take 81 mg by mouth daily with breakfast.   CALCIUM + D PO Take 2 tablets by mouth daily with lunch.   carvedilol 6.25 MG tablet Commonly known  as:  COREG Take 1 tablet (6.25 mg total) by mouth 2 (two) times daily with a meal.   cetirizine 10 MG tablet Commonly known as:  ZYRTEC Take 1 tablet by mouth every other day.   cycloSPORINE 0.05 % ophthalmic emulsion Commonly known as:  RESTASIS Place 1 drop into both eyes every 12 (twelve) hours.   DELSYM 30 MG/5ML liquid Generic drug:  dextromethorphan Take 15 mg by mouth as needed for cough.   diphenhydrAMINE 25 MG tablet Commonly known as:  BENADRYL Take 25 mg by mouth every 6 (six) hours as needed for allergies. Reported on 08/06/2015   EPINEPHrine 0.3 mg/0.3 mL Soaj injection Commonly known as:  EPIPEN 2-PAK Inject 0.3 mLs (0.3 mg total) into the muscle once.   famotidine 20 MG tablet Commonly known as:  PEPCID Take 1 tablet (20 mg total) by mouth 2 (two) times daily.   fluticasone 50  MCG/ACT nasal spray Commonly known as:  FLONASE Place 1-2 sprays into both nostrils daily as needed for allergies or rhinitis.   glucosamine-chondroitin 500-400 MG tablet Take 1 tablet by mouth 3 (three) times daily.   HYDROcodone-acetaminophen 5-325 MG tablet Commonly known as:  NORCO/VICODIN Take 1-2 tablets by mouth every 4 (four) hours as needed for moderate pain or severe pain.   montelukast 10 MG tablet Commonly known as:  SINGULAIR Take 1 tablet (10 mg total) by mouth at bedtime.   multivitamin with minerals Tabs tablet Take 1 tablet by mouth daily with breakfast.   spironolactone 25 MG tablet Commonly known as:  ALDACTONE Take 1 tablet (25 mg total) by mouth daily.   SYSTANE ULTRA 0.4-0.3 % Soln Generic drug:  Polyethyl Glycol-Propyl Glycol Place 1 drop into both eyes daily as needed (dry eyes).          Objective:   Physical Exam BP 126/78 (BP Location: Left Arm, Patient Position: Sitting, Cuff Size: Normal)   Pulse 74   Temp 98.2 F (36.8 C) (Oral)   Resp 14   Ht 5\' 2"  (1.575 m)   Wt 168 lb (76.2 kg)   BMI 30.73 kg/m  General:   Well developed, well nourished . NAD.  HEENT:  Normocephalic . Face symmetric, atraumatic. No jaundice Lungs:  CTA B Normal respiratory effort, no intercostal retractions, no accessory muscle use. Heart: RRR,  no murmur.  no pretibial edema bilaterally . Calves symmetric Abdomen:  Not distended, soft, 90 TTP throughout without rebound. Surgical scars without redness, swelling or discharge..  Skin: Not pale. Not jaundice Neurologic:  alert & oriented X3.  Speech normal, gait somewhat antalgic from postsurgical pain Psych--  Cognition and judgment appear intact.  Cooperative with normal attention span and concentration.  Behavior appropriate. No anxious or depressed appearing.    Assessment & Plan:   Assessment Prediabetes HTN Hyperlipidemia Thrombocytopenia, hematology eval 2013, RTC PRN Allergic rhinitis,  urticaria-- Dr Donneta Romberg Dry eye syndrome H/o Anxiety: d/t husband's health (lost husband 2015) Cholecystitis and choledocholithiasis: 05-2016, s/p surgery, ERCP  PLAN: Cholecystitis and choledocholithiasis: Recovering from surgery, seems to have appropriate postop pain, gradually better, tolerating food and having bowel movements. Plan: Keep appointment with her surgeon; obtain CBC, CMP , Mg levels. Call her surgeon or this office  if she does not continue improving gradually or if she has fever, chills, constipation, swelling, chest pain, difficulty breathing. Recommend to come back in few months for a checkup.

## 2016-06-13 NOTE — Progress Notes (Signed)
Pre visit review using our clinic review tool, if applicable. No additional management support is needed unless otherwise documented below in the visit note. 

## 2016-06-13 NOTE — Patient Instructions (Signed)
GO TO THE LAB : Get the blood work     GO TO THE FRONT DESK Schedule your next appointment for a  Check up for June 2018   Keep the appointment to see your surgeon

## 2016-06-15 NOTE — Assessment & Plan Note (Signed)
Cholecystitis and choledocholithiasis: Recovering from surgery, seems to have appropriate postop pain, gradually better, tolerating food and having bowel movements. Plan: Keep appointment with her surgeon; obtain CBC, CMP , Mg levels. Call her surgeon or this office  if she does not continue improving gradually or if she has fever, chills, constipation, swelling, chest pain, difficulty breathing. Recommend to come back in few months for a checkup.

## 2016-08-22 IMAGING — DX DG CHEST 2V
2 series · 2 of 2 positions shown · non-contrast
Comparison: Chest x-ray of 02/11/2014

CLINICAL DATA: History of pneumonia, followup, cough

EXAM:
CHEST  2 VIEW

[chest pa]
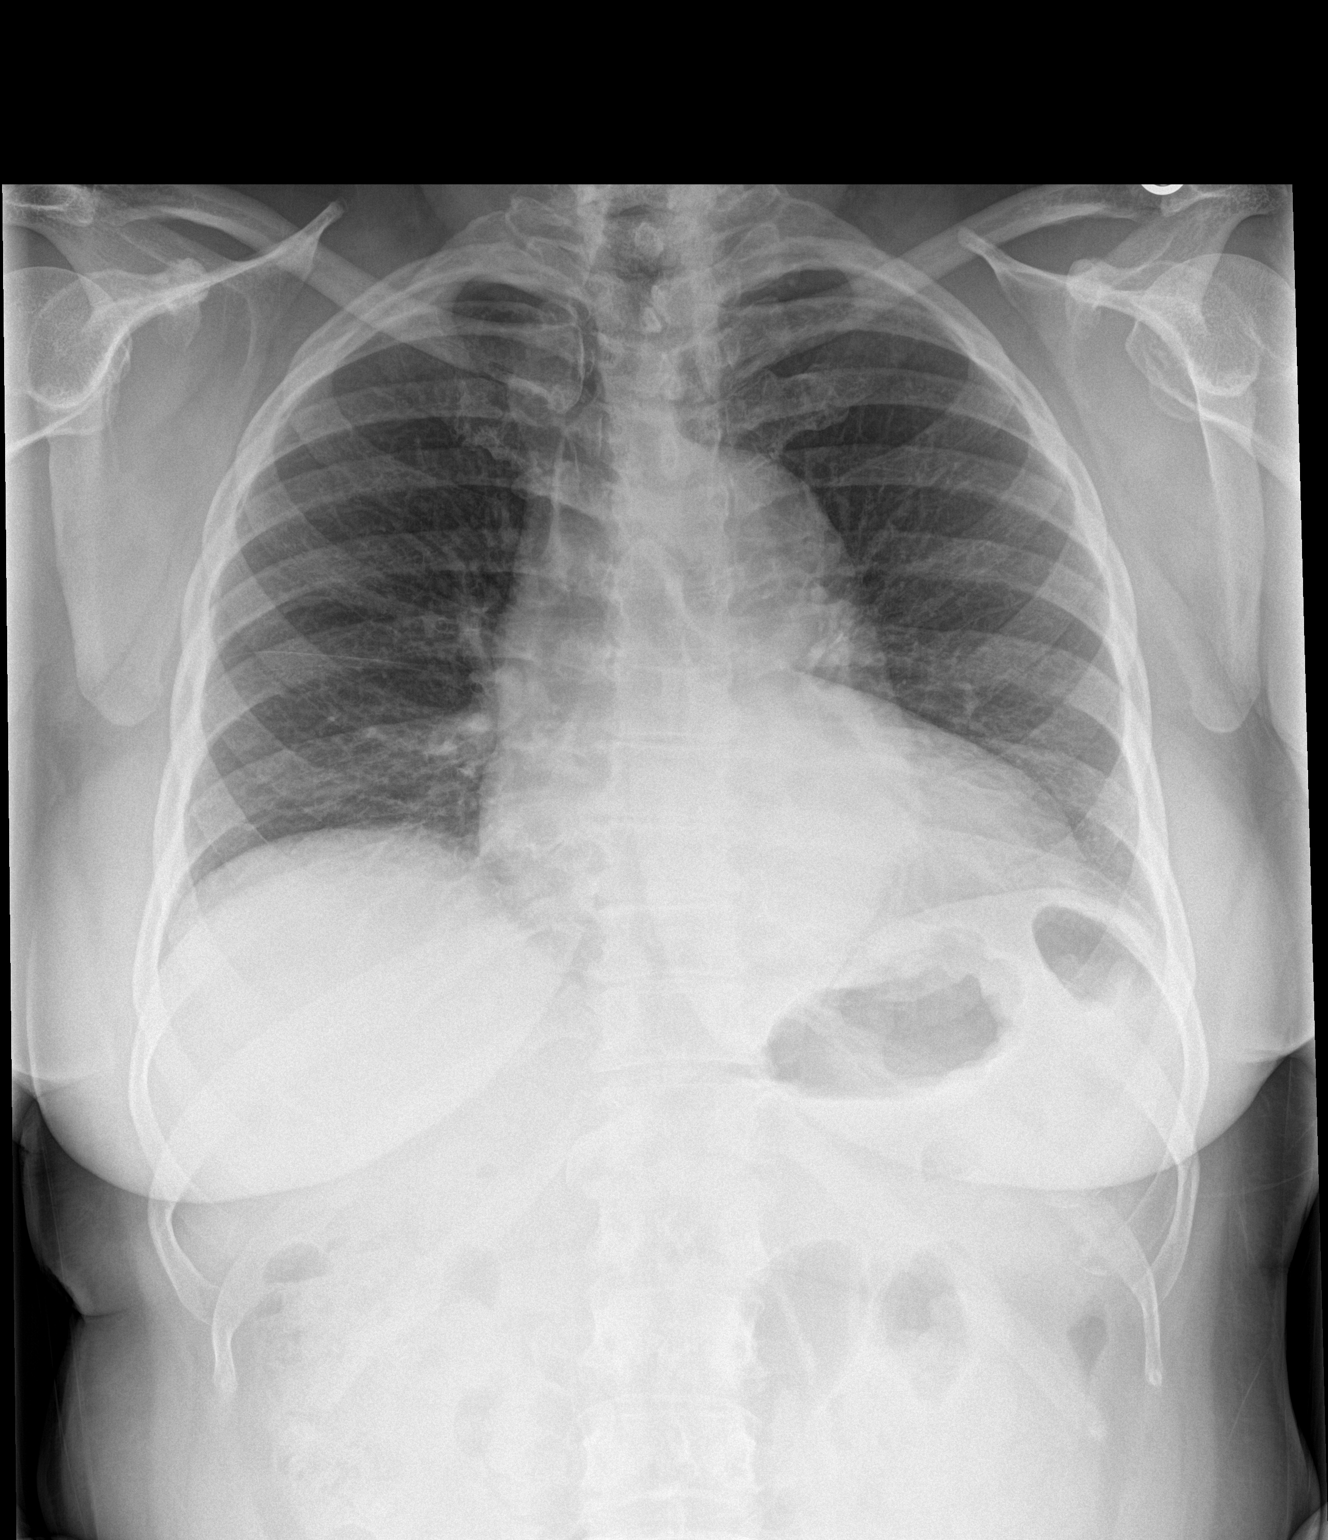

[chest lat]
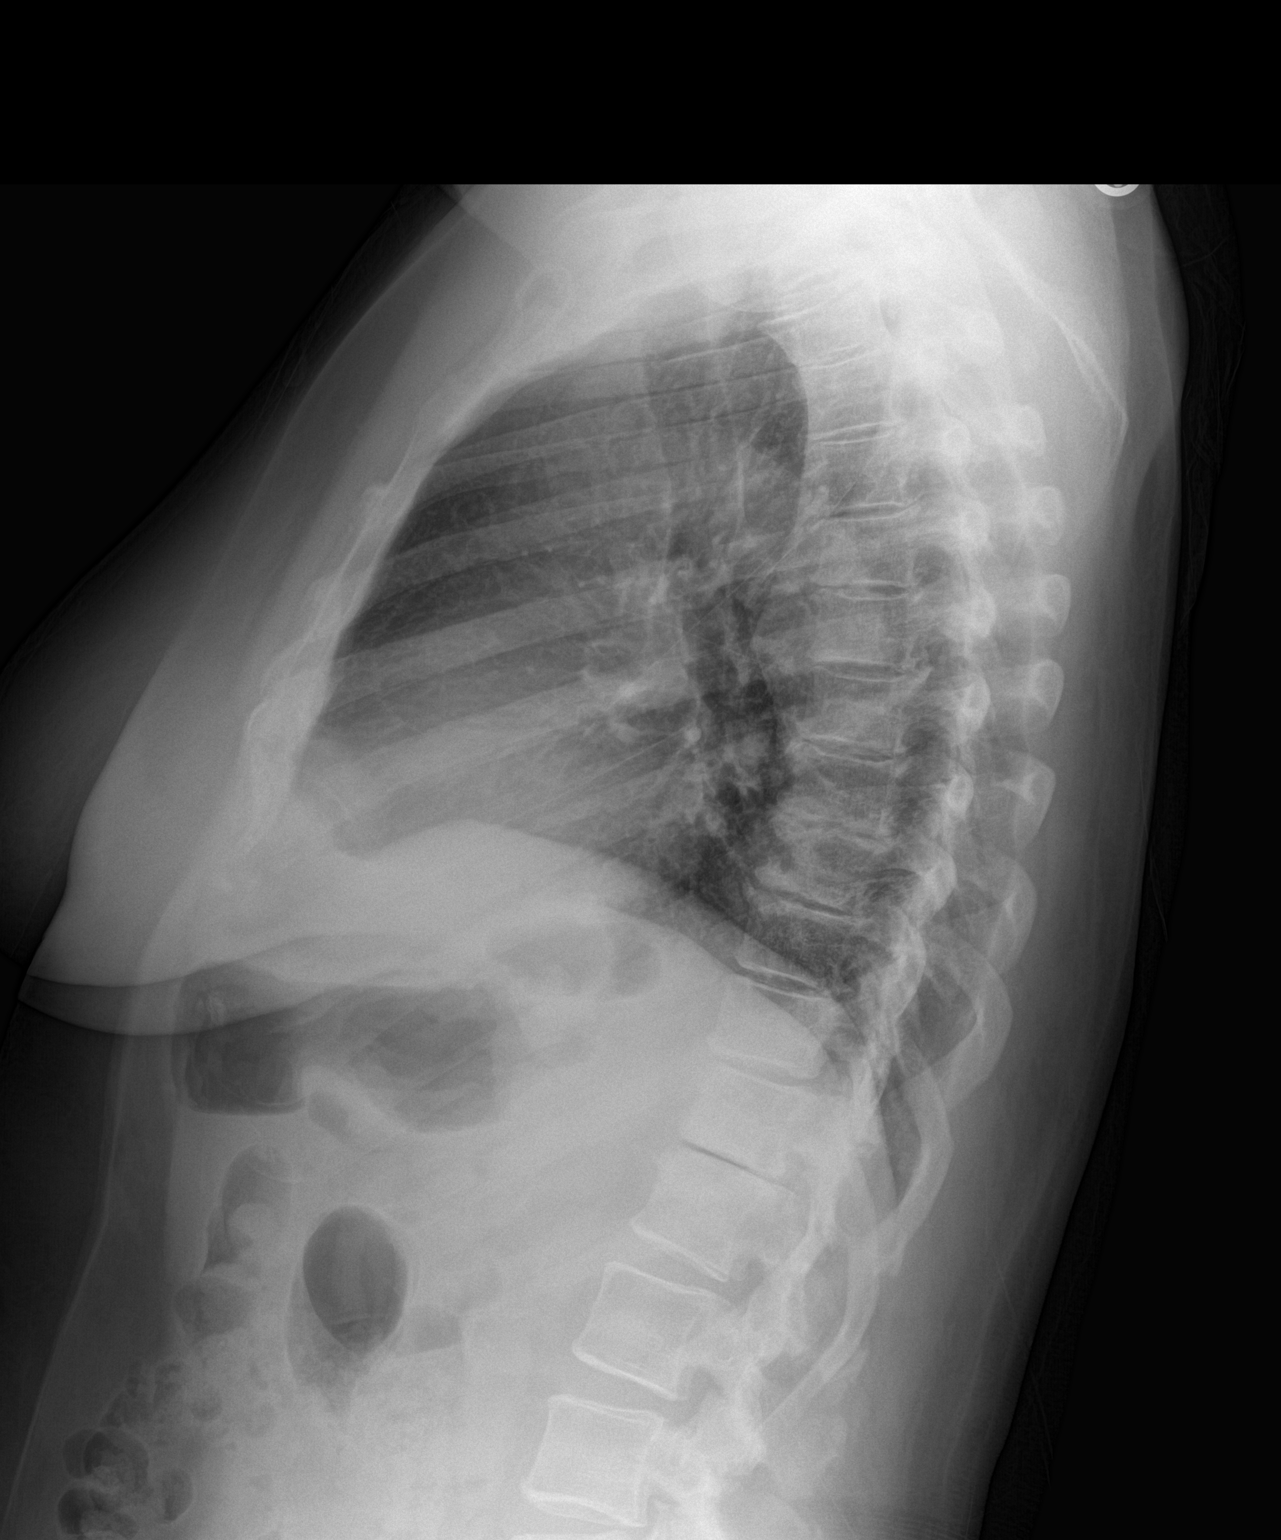

[2 of 2 positions shown; findings below may reference images not displayed]

FINDINGS: No focal infiltrate or effusion is seen. The lungs appear better
aerated. Cardiomegaly is stable. There is mild degenerative change
in the mid to lower thoracic spine.
IMPRESSION: Stable mild cardiomegaly.  No active cardiopulmonary disease.

## 2016-09-01 MED FILL — RESTASIS 0.05% EYE EMULSION: 0.05 | 30 days supply | Qty: 60 | Fill #0 | Status: TO

## 2016-09-04 MED FILL — CARVEDILOL 6.25 MG TABLET: 6.25 | 90 days supply | Qty: 180 | Fill #1

## 2016-09-04 MED FILL — SPIRONOLACTONE 25 MG TABLET: 25 | 90 days supply | Qty: 90 | Fill #1

## 2016-09-10 ENCOUNTER — Encounter: Payer: Self-pay | Admitting: Internal Medicine

## 2016-09-10 ENCOUNTER — Ambulatory Visit (INDEPENDENT_AMBULATORY_CARE_PROVIDER_SITE_OTHER): Payer: Medicare Other | Admitting: Internal Medicine

## 2016-09-10 VITALS — BP 118/68 | HR 78 | Temp 97.8°F | Resp 14 | Ht 62.0 in | Wt 165.4 lb

## 2016-09-10 DIAGNOSIS — F59 Unspecified behavioral syndromes associated with physiological disturbances and physical factors: Secondary | ICD-10-CM

## 2016-09-10 DIAGNOSIS — F419 Anxiety disorder, unspecified: Principal | ICD-10-CM

## 2016-09-10 DIAGNOSIS — F329 Major depressive disorder, single episode, unspecified: Secondary | ICD-10-CM

## 2016-09-10 DIAGNOSIS — F423 Hoarding disorder: Secondary | ICD-10-CM

## 2016-09-10 DIAGNOSIS — F418 Other specified anxiety disorders: Secondary | ICD-10-CM | POA: Diagnosis not present

## 2016-09-10 DIAGNOSIS — F32A Depression, unspecified: Secondary | ICD-10-CM

## 2016-09-10 MED ORDER — ESCITALOPRAM OXALATE 10 MG PO TABS: 10.0000 mg | ORAL_TABLET | Freq: Every day | ORAL | 1 refills | Status: DC

## 2016-09-10 NOTE — Progress Notes (Signed)
Pre visit review using our clinic review tool, if applicable. No additional management support is needed unless otherwise documented below in the visit note. 

## 2016-09-10 NOTE — Patient Instructions (Addendum)
GO TO THE LAB : Get the blood work    GO TO THE FRONT DESK Schedule your next appointment for a  checkup in 4-5 weeks  Please call our counselors and get an appointment   Start taking lexapro 10 mg:  1/2 tablet a day x 10 days , then 1 tablet a day  Watch for increasing suicidal ideas, seek help immediately  Call our 24-hour HelpLine at [phone] or [place] 1-800-SUICIDE [facility] [place] 1-800-273-TALK

## 2016-09-10 NOTE — Progress Notes (Signed)
Subjective:    Patient ID: Monique [name], female    DOB: March 27, 1941, 76 y.o.   MRN: [id number]  DOS:  09/10/2016 Type of visit - description : acute, here with 4 family members, I asked only his son and her sister Monique [name] to come in to the room.  Interval history: They are concerned about hoarding behavior, the son described that before they clean pt's  house, he was unable to walk in her living room because the accumulation of things. Also, she has bag and boxes full of trash that she keeps. Her sister is concerned because she feels Monique [name] is under a lot of stress due to a number of losses in her life including her husband 2 years ago. I asked the patient how she feels, she said "I 'm probably under a lot of stress".  She is taking care of her mother, she also helps other family members. One night she was taking an uncle to the emergency room and she felt so tired that she started crying.   Review of Systems The family reports no change in her mental status, no detachment from reality. The sister reported that "changing behavior" but she could not be specific, "is just different". When asked, pt admits to occasional thinking about suicide. Thoughts are fleeting and not persistent. Denies frequent crying spells. Physically, she states that she feels well. No headache, chest pain, difficulty breathing or major MSK problems.   Past Medical History:  Diagnosis Date  . Allergy   . Anemia    when had uterine fibroids  . Anxiety   . Cataract    very small  . Cholelithiasis   . DJD (degenerative joint disease)   . Dry eye syndrome   . Headache(784.0)   . Hyperlipidemia 01/20/2011  . Hypertension 09/29/2006  . Thrombocytopenia ([place]) 08/27/2011   hematology evaluation 08-2011, return prn  . Urticaria   . Vasomotor rhinitis     Past Surgical History:  Procedure Laterality Date  . ABDOMINAL HYSTERECTOMY     no oophorectomy  . APPENDECTOMY    . CHOLECYSTECTOMY N/A 06/06/2016   Procedure: LAPAROSCOPIC CHOLECYSTECTOMY WITH INTRAOPERATIVE CHOLANGIOGRAM;  Surgeon: Johnathan Hausen, MD;  Location: [facility];  Service: General;  Laterality: N/A;  . COLONOSCOPY    . DILATION AND CURETTAGE OF UTERUS    . mole removals    . TONSILLECTOMY    . UMBILICAL HERNIA REPAIR      Social History   Social History  . Marital status: Widowed    Spouse name: N/A  . Number of children: 2  . Years of education: N/A   Occupational History  . 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 Smoking status: Never Smoker  . Smokeless tobacco: Never Used  . Alcohol use No  . Drug use: No  . Sexual activity: Not on file   Other Topics Concern  . Not on file   Social History Narrative   Her mother lives w/ her    husband had prostate ca , passed away 10/21/13   takes care of mom, 9 y/o who is blind , and two other fam members              Allergies as of 09/10/2016      Reactions   Bee Venom Anaphylaxis   Doxycycline Hives, Itching   Penicillins Itching, Nausea And Vomiting, Swelling   syncope   Sertraline Other (See Comments)  Dry Mouth   Azithromycin Itching, Rash   Cerumenex [trolamine] Itching, Rash   Climara [estradiol] Itching, Rash   Nickel Rash      Medication List       Accurate as of 09/10/16 11:59 PM. Always use your most recent med list.          aspirin 81 MG tablet Take 81 mg by mouth daily with breakfast.   CALCIUM + D PO Take 2 tablets by mouth daily with lunch.   carvedilol 6.25 MG tablet Commonly known as:  COREG Take 1 tablet (6.25 mg total) by mouth 2 (two) times daily with a meal.   cetirizine 10 MG tablet Commonly known as:  ZYRTEC Take 1 tablet by mouth every other day.   cycloSPORINE 0.05 % ophthalmic emulsion Commonly known as:  RESTASIS Place 1 drop into both eyes every 12 (twelve) hours.   DELSYM 30 MG/5ML liquid Generic drug:  dextromethorphan Take 15 mg by mouth as needed for cough.   diphenhydrAMINE  25 MG tablet Commonly known as:  BENADRYL Take 25 mg by mouth every 6 (six) hours as needed for allergies. Reported on 08/06/2015   EPINEPHrine 0.3 mg/0.3 mL Soaj injection Commonly known as:  EPIPEN 2-PAK Inject 0.3 mLs (0.3 mg total) into the muscle once.   escitalopram 10 MG tablet Commonly known as:  LEXAPRO Take 1 tablet (10 mg total) by mouth daily.   famotidine 20 MG tablet Commonly known as:  PEPCID Take 1 tablet (20 mg total) by mouth 2 (two) times daily.   fluticasone 50 MCG/ACT nasal spray Commonly known as:  FLONASE Place 1-2 sprays into both nostrils daily as needed for allergies or rhinitis.   glucosamine-chondroitin 500-400 MG tablet Take 1 tablet by mouth 3 (three) times daily.   HYDROcodone-acetaminophen 5-325 MG tablet Commonly known as:  NORCO/VICODIN Take 1-2 tablets by mouth every 4 (four) hours as needed for moderate pain or severe pain.   montelukast 10 MG tablet Commonly known as:  SINGULAIR Take 1 tablet (10 mg total) by mouth at bedtime.   multivitamin with minerals Tabs tablet Take 1 tablet by mouth daily with breakfast.   spironolactone 25 MG tablet Commonly known as:  ALDACTONE Take 1 tablet (25 mg total) by mouth daily.   SYSTANE ULTRA 0.4-0.3 % Soln Generic drug:  Polyethyl Glycol-Propyl Glycol Place 1 drop into both eyes daily as needed (dry eyes).          Objective:   Physical Exam BP 118/68 (BP Location: Left Arm, Patient Position: Sitting, Cuff Size: Normal)   Pulse 78   Temp 97.8 F (36.6 C) (Oral)   Resp 14   Ht 5\' 2"  (1.575 m)   Wt 165 lb 6 oz (75 kg)   SpO2 93%   BMI 30.25 kg/m  General:   Well developed, well nourished . NAD.  HEENT:  Normocephalic . Face symmetric, atraumatic Skin: Not pale. Not jaundice Neurologic:  alert & oriented self, time, circumstance, place. She actually looks at baseline or better, she displayed good sense of humor today.  Speech normal, gait appropriate for age and unassisted Psych--    Cognition and judgment appear intact.  Cooperative with normal attention span and concentration.  Behavior appropriate. No anxious or depressed appearing.      Assessment & Plan:  Assessment Prediabetes HTN Hyperlipidemia Thrombocytopenia, hematology eval 2013, RTC PRN Allergic rhinitis, urticaria-- Dr Donneta Romberg Dry eye syndrome H/o Anxiety: d/t husband's health (lost husband 2015) Cholecystitis and choledocholithiasis: 05-2016, s/p  surgery, ERCP  PLAN: Hoarding behavior: By what the family describe, she definitely displays a hoarding behavior, that could be related to underlying comorbidities such as OCD, anxiety, bipolar etc. Discussed with the patient and the family the need for treatment, her house needs to be monitored for safety (example fire hazard). See next Anxiety depression:  PHQ-9 18 (moderate) I counseled her to the best of my ability. She definitely needs formal counseling, information provided. I also going to refer her for a neuropsychiatric eval, the patient's fam suspect early dementia and that needs to be rule out. Discuss treatment with SSRIs, previously Zoloft caused dry eyes, recommend trial with lexapro, s/e d/w pt Suicidality was addressed, has not been a major problem, she is aware to look for help immediately Labs: CMP, TSH, RPR, M54, folic acid. Pt and family in agreement RTC 4-5 weeks  Today, I spent more than 45   min with the patient and her family member, listening carefully to the description of the symptoms, addressing their concerns, listening to the patient, explaining why I think she has a hoarding behavior, advising about risk of such such as fire hazard. I also answered a number of questions.Marland Kitchen

## 2016-09-11 DIAGNOSIS — F423 Hoarding disorder: Secondary | ICD-10-CM | POA: Insufficient documentation

## 2016-09-11 LAB — VITAMIN B12: Vitamin B-12: 432 pg/mL (ref 211–911)

## 2016-09-11 LAB — COMPREHENSIVE METABOLIC PANEL
ALT: 22 U/L (ref 0–35)
AST: 20 U/L (ref 0–37)
Albumin: 4.2 g/dL (ref 3.5–5.2)
Alkaline Phosphatase: 65 U/L (ref 39–117)
BUN: 14 mg/dL (ref 6–23)
CO2: 29 mEq/L (ref 19–32)
Calcium: 9.7 mg/dL (ref 8.4–10.5)
Chloride: 107 mEq/L (ref 96–112)
Creatinine, Ser: 0.89 mg/dL (ref 0.40–1.20)
GFR: 79.27 mL/min (ref >60.00)
Glucose, Bld: 76 mg/dL (ref 70–99)
Potassium: 4.3 mEq/L (ref 3.5–5.1)
Sodium: 142 mEq/L (ref 135–145)
Total Bilirubin: 0.5 mg/dL (ref 0.2–1.2)
Total Protein: 6.8 g/dL (ref 6.0–8.3)

## 2016-09-11 LAB — TSH: TSH: 1.42 u[IU]/mL (ref 0.35–4.50)

## 2016-09-11 LAB — RPR

## 2016-09-11 LAB — FOLATE: Folate: >23.5 ng/mL (ref >5.9)

## 2016-09-11 NOTE — Assessment & Plan Note (Signed)
Hoarding behavior: By what the family describe, she definitely displays a hoarding behavior, that could be related to underlying comorbidities such as OCD, anxiety, bipolar etc. Discussed with the patient and the family the need for treatment, her house needs to be monitored for safety (example fire hazard). See next Anxiety depression:  PHQ-9 18 (moderate) I counseled her to the best of my ability. She definitely needs formal counseling, information provided. I also going to refer her for a neuropsychiatric eval, the patient's fam suspect early dementia and that needs to be rule out. Discuss treatment with SSRIs, previously Zoloft caused dry eyes, recommend trial with lexapro, s/e d/w pt Suicidality was addressed, has not been a major problem, she is aware to look for help immediately Labs: CMP, TSH, RPR, Z97, folic acid. Pt and family in agreement RTC 4-5 weeks

## 2016-09-15 ENCOUNTER — Encounter: Payer: Self-pay | Admitting: Psychology

## 2016-09-19 DIAGNOSIS — L509 Urticaria, unspecified: Secondary | ICD-10-CM | POA: Diagnosis not present

## 2016-09-19 DIAGNOSIS — J300 Vasomotor rhinitis: Secondary | ICD-10-CM | POA: Diagnosis not present

## 2016-10-20 ENCOUNTER — Ambulatory Visit (INDEPENDENT_AMBULATORY_CARE_PROVIDER_SITE_OTHER): Payer: Medicare Other | Admitting: Psychology

## 2016-10-20 DIAGNOSIS — F423 Hoarding disorder: Secondary | ICD-10-CM

## 2016-10-20 DIAGNOSIS — R413 Other amnesia: Secondary | ICD-10-CM | POA: Diagnosis not present

## 2016-10-20 NOTE — Progress Notes (Signed)
   Neuropsychology Note  Monique [name] came in today for 2 hours of neuropsychological testing with technician, Monique [name], BS, under the supervision of Dr. Macarthur Critchley. The patient did not appear overtly distressed by the testing session, per behavioral observation or via self-report to the technician. Rest breaks were offered. Monique [name] will return within 2 weeks for a feedback session with Dr. Si Raider at which time her test performances, clinical impressions and treatment recommendations will be reviewed in detail. The patient understands she can contact our office should she require our assistance before this time.  Full report to follow.

## 2016-10-20 NOTE — Progress Notes (Signed)
NEUROPSYCHOLOGICAL INTERVIEW (CPT: [id number])  Name: [name] Date of Birth: [birth year] Date of Interview: 10/20/2016  Reason for Referral:  SRAH AKE is a 76 y.o. widowed female who is referred for neuropsychological evaluation by Dr. Kathlene November of [facility] due to concerns about behavioral changes and possible dementia. This patient is accompanied in the office by her sister who supplements the history.   History of Presenting Problem:  Ms. Buendia was most recently seen by her PCP, Dr. Larose Kells, on 09/10/2016, due to her family's concerns about changes in behavior. Specifically, they reported significant hoarding to the point that they were unable to walk through the living room due to accumulation of items. She was keeping bags and boxes full of trash. She was referred for neuropsychological testing to rule out dementia.   At today's visit, the patient admits that she has been hoarding things in her home, stating this is probably because she is so busy that she has not had time to go through things. She noted that she is the primary caregiver for her mother, who is in her 65s and is legally blind. She has been taking care of her mother since 2003. Her mother lives with her. She also is a caregiver for her uncle who used to live with her but now lives in a facility. Additionally, she cared for her husband until his death 3 years ago. The patient's sister and other family members are trying to step in and relieve the patient of some of her responsibilities which has seemed to help the patient. Her sister worries she is on too much medication.  The patient's sister reported she has many concerns about the patient. She noticed a change in her sister about 6-9 months ago. She reported the patient has always been very even-keel in the past but now her temper flares up. She did not know about the patient's hoarding until the patient's sons informed her. This was a red flag because the  patient had always been very clean and meticulous with her home. Additionally, her sister reported that "her memory is not where it was".   The patient admits she may forget things. She states she used to put everything on her calendar but she has gotten away from doing that and she has to call and ask what she is supposed to be doing that day.  Upon direct questioning, the patient and her sister reported the following with regard to current cognitive functioning:   Forgetting recent conversations/events: Sometimes. Sister says she can't figure out if it is because the patient is not focused at the time of conversation. This troubles the patient's sister. Repeating statements/questions: No Misplacing/losing items: No Forgetting appointments or other obligations: Yes, not using the calendar anymore. Forgetting to take medications: Patient says no. She does not use a weekly AM/PM pillbox, but states she has a method with ziplock bags.  Difficulty concentrating: Yes per sister Distracted easily: Yes, by thoughts ("so much stuff going on in my head - what I need to do, etc") Processing information more slowly: Sister says yes.   Communication changes: Yes, per her sister she is very tangential (unclear if she has always been this way or if this is new) Word-finding difficulty: No Comprehension difficulty: Not sure about auditory comprehension. In terms of reading comprehension, she notes she doesn't read as much as she used to but she is trying to get back to it, she is in a book club with her  sister  Getting lost when driving: Says she has always gotten lost and never had a good sense of direction. She has never had trouble getting to familiar locations though.  With regard to mood, the patient stated that she does not think she is depressed or anxious. She does have a significant amount of stress related to caregiving roles. She denied sleep difficulty. Her appetite is good. She said that on  occasion when she has been under a lot of stress she has talked about suicide but she denied any intention or plan. She does have a family history of suicide (two uncles) and suicide attempts (2 other relatives). The patient has attained counseling services in the past when her stepfather died and when her husband died. Otherwise, there is no history of mental health treatment/services. She denied history of substance abuse or dependence.   There is no known family history of dementia. The patient's mother is age 58 and is showing some signs of memory loss.   Current Functioning: The patient continues to drive, manage her medications, appointments and cooking. Her sister took over the finances to provide some relief for the patient.   Physically, the patient reports occasional back pain and fatigue. She reported that after starting Lexapro she started feeling more sleep and is frequently nauseous. She takes the medication at night.   She denied difficulty with walking or balance. She denied frequent falls. She did have a couple of falls in the past year. One time she was on a ladder in her kitchen and was not in appropriate shoes and slipped and fell. She was able to get up and finish doing the cleaning she was doing before she fell. Another time, she was standing on a stool to get something out of her [place] cabinet when she slid back and hit the floor. She did hit her head. She did not suffer loss of consciousness in either fall.    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 Occupational history: Worked several jobs but retired from the [employer]. 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 Marital history: Widowed. Was married for 107 years before husband passed away. Two sons. One in [place].  Alcohol/Tobacco/Substances: No alcohol. Never a smoker. No SA.   Medical History: Past Medical History:  Diagnosis  Date  . Allergy   . Anemia    when had uterine fibroids  . Anxiety   . Cataract    very small  . Cholelithiasis   . DJD (degenerative joint disease)   . Dry eye syndrome   . Headache(784.0)   . Hyperlipidemia 01/20/2011  . Hypertension 09/29/2006  . Thrombocytopenia ([facility]) 08/27/2011   hematology evaluation 08-2011, return prn  . Urticaria   . Vasomotor rhinitis      Current Medications:  Outpatient Encounter Prescriptions as of 10/20/2016  Medication Sig  . aspirin 81 MG tablet Take 81 mg by mouth daily with breakfast.   . Calcium-Vitamin D (CALCIUM + D PO) Take 2 tablets by mouth daily with lunch.   . carvedilol (COREG) 6.25 MG tablet Take 1 tablet (6.25 mg total) by mouth 2 (two) times daily with a meal.  . cetirizine (ZYRTEC) 10 MG tablet Take 1 tablet by mouth every other day.   . cycloSPORINE (RESTASIS) 0.05 % ophthalmic emulsion Place 1 drop into both eyes every 12 (twelve) hours.   Marland Kitchen dextromethorphan (DELSYM) 30 MG/5ML liquid Take 15 mg by mouth  as needed for cough.  . diphenhydrAMINE (BENADRYL) 25 MG tablet Take 25 mg by mouth every 6 (six) hours as needed for allergies. Reported on 08/06/2015  . EPINEPHrine (EPIPEN 2-PAK) 0.3 mg/0.3 mL IJ SOAJ injection Inject 0.3 mLs (0.3 mg total) into the muscle once. (Patient not taking: Reported on 06/13/2016)  . escitalopram (LEXAPRO) 10 MG tablet Take 1 tablet (10 mg total) by mouth daily.  . famotidine (PEPCID) 20 MG tablet Take 1 tablet (20 mg total) by mouth 2 (two) times daily. (Patient taking differently: Take 20 mg by mouth daily. )  . fluticasone (FLONASE) 50 MCG/ACT nasal spray Place 1-2 sprays into both nostrils daily as needed for allergies or rhinitis.  Marland Kitchen glucosamine-chondroitin 500-400 MG tablet Take 1 tablet by mouth 3 (three) times daily.    Marland Kitchen HYDROcodone-acetaminophen (NORCO/VICODIN) 5-325 MG tablet Take 1-2 tablets by mouth every 4 (four) hours as needed for moderate pain or severe pain. (Patient not taking: Reported on  06/13/2016)  . montelukast (SINGULAIR) 10 MG tablet Take 1 tablet (10 mg total) by mouth at bedtime.  . Multiple Vitamin (MULTIVITAMIN WITH MINERALS) TABS Take 1 tablet by mouth daily with breakfast.  . Polyethyl Glycol-Propyl Glycol (SYSTANE ULTRA) 0.4-0.3 % SOLN Place 1 drop into both eyes daily as needed (dry eyes).   Marland Kitchen spironolactone (ALDACTONE) 25 MG tablet Take 1 tablet (25 mg total) by mouth daily.   No facility-administered encounter medications on file as of 10/20/2016.      Behavioral Observations:   Appearance: Neatly and appropriately dressed and groomed Gait: Ambulated independently, no abnormalities observed Speech: Fluent; normal rate, rhythm and volume Thought process: Generally linear Affect: Full, mildly dysthymic/anxious Interpersonal: Pleasant, appropriate   TESTING: There is medical necessity to proceed with neuropsychological assessment as the results will be used to aid in differential diagnosis and clinical decision-making and to inform specific treatment recommendations. Per the patient, her sister and medical records reviewed, there has been a change in cognitive functioning and a reasonable suspicion of dementia. After completing the clinical interview, Ms. Wickwire completed a full battery of neuropsychological testing with a psychometrician under my supervision.   PLAN: Ms. Haser will return within 2 weeks for a follow-up session with me at which time her test performances and my impressions and treatment recommendations will be reviewed in detail.   Full neuropsychological evaluation report to follow.

## 2016-10-22 ENCOUNTER — Encounter: Payer: Self-pay | Admitting: Psychology
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
## 2016-10-28 ENCOUNTER — Ambulatory Visit (INDEPENDENT_AMBULATORY_CARE_PROVIDER_SITE_OTHER): Payer: Medicare Other | Admitting: Psychology

## 2016-10-28 ENCOUNTER — Encounter: Payer: Self-pay | Admitting: Psychology

## 2016-10-28 DIAGNOSIS — R413 Other amnesia: Secondary | ICD-10-CM | POA: Diagnosis not present

## 2016-10-28 DIAGNOSIS — F423 Hoarding disorder: Secondary | ICD-10-CM

## 2016-10-28 DIAGNOSIS — G3184 Mild cognitive impairment, so stated: Secondary | ICD-10-CM

## 2016-12-04 MED FILL — CARVEDILOL 6.25 MG TABLET: 6.25 | 90 days supply | Qty: 180 | Fill #2

## 2016-12-04 MED FILL — SPIRONOLACTONE 25 MG TABLET: 25 | 90 days supply | Qty: 90 | Fill #2

## 2016-12-11 DIAGNOSIS — Z1231 Encounter for screening mammogram for malignant neoplasm of breast: Secondary | ICD-10-CM | POA: Diagnosis not present

## 2016-12-12 ENCOUNTER — Ambulatory Visit: Payer: Medicare Other | Admitting: Internal Medicine

## 2017-01-06 ENCOUNTER — Ambulatory Visit: Payer: Medicare Other | Admitting: Internal Medicine

## 2017-01-22 MED FILL — RESTASIS 0.05% EYE EMULSION: 0.05 | 30 days supply | Qty: 60 | Fill #1 | Status: TO

## 2017-02-04 ENCOUNTER — Telehealth: Payer: Self-pay | Admitting: Internal Medicine

## 2017-02-04 NOTE — Telephone Encounter (Signed)
Called pt 02/04/17, no answer, Pt has Medicare Part A & B (does not cover CPE). Need to change visit type. Will change once pt confirms. SF

## 2017-02-05 ENCOUNTER — Encounter: Payer: Self-pay | Admitting: Internal Medicine

## 2017-02-05 ENCOUNTER — Ambulatory Visit (INDEPENDENT_AMBULATORY_CARE_PROVIDER_SITE_OTHER): Payer: Medicare Other | Admitting: Internal Medicine

## 2017-02-05 VITALS — BP 124/80 | HR 79 | Temp 97.8°F | Resp 14 | Ht 62.0 in | Wt 163.2 lb

## 2017-02-05 DIAGNOSIS — R739 Hyperglycemia, unspecified: Secondary | ICD-10-CM | POA: Diagnosis not present

## 2017-02-05 DIAGNOSIS — G3184 Mild cognitive impairment, so stated: Secondary | ICD-10-CM | POA: Diagnosis not present

## 2017-02-05 DIAGNOSIS — F32A Depression, unspecified: Secondary | ICD-10-CM

## 2017-02-05 DIAGNOSIS — F329 Major depressive disorder, single episode, unspecified: Secondary | ICD-10-CM

## 2017-02-05 DIAGNOSIS — F419 Anxiety disorder, unspecified: Secondary | ICD-10-CM

## 2017-02-05 DIAGNOSIS — E785 Hyperlipidemia, unspecified: Secondary | ICD-10-CM | POA: Diagnosis not present

## 2017-02-05 DIAGNOSIS — I10 Essential (primary) hypertension: Secondary | ICD-10-CM

## 2017-02-05 LAB — BASIC METABOLIC PANEL
BUN: 11 mg/dL (ref 6–23)
CO2: 31 mEq/L (ref 19–32)
Calcium: 9.7 mg/dL (ref 8.4–10.5)
Chloride: 106 mEq/L (ref 96–112)
Creatinine, Ser: 0.96 mg/dL (ref 0.40–1.20)
GFR: 72.56 mL/min (ref >60.00)
Glucose, Bld: 101 mg/dL — ABNORMAL HIGH (ref 70–99)
Potassium: 4.8 mEq/L (ref 3.5–5.1)
Sodium: 140 mEq/L (ref 135–145)

## 2017-02-05 LAB — LIPID PANEL
Cholesterol: 158 mg/dL (ref 0–200)
HDL: 51.5 mg/dL (ref >39.00)
LDL Cholesterol: 86 mg/dL (ref 0–99)
NonHDL: 106.36
Total CHOL/HDL Ratio: 3
Triglycerides: 103 mg/dL (ref 0.0–149.0)
VLDL: 20.6 mg/dL (ref 0.0–40.0)

## 2017-02-05 LAB — HEMOGLOBIN A1C: Hgb A1c MFr Bld: 6 % (ref 4.6–6.5)

## 2017-02-05 NOTE — Progress Notes (Signed)
Pre visit review using our clinic review tool, if applicable. No additional management support is needed unless otherwise documented below in the visit note. 

## 2017-02-05 NOTE — Assessment & Plan Note (Signed)
-  Td 2014;  pneumonia shot 11-08;  prevnar 2015;  zostavax 2007; shingrex in back order, discuss next year -Female care : [facility] 10-2015  Per [facility] -DEXA 10-2013 normal , on Ca and Vit D -[facility]: Cscope 10-2005 (-)and 02-2016  @ [facility], next per GI

## 2017-02-05 NOTE — Progress Notes (Signed)
Subjective:    Patient ID: Monique [name], female    DOB: May 20, 1941, 76 y.o.   MRN: [id number]  DOS:  02/05/2017 Type of visit - description : Follow-up Interval history: HTN: Good compliance of medication, no ambulatory BPs, BP today is very good Anxiety depression: Took Lexapro temporarily but ran out. Pt unsure about the effectiveness or side effects. Memory issues: Saw neuropsychologist 3, they diagnosed her with MCI, recommend MRI.   Review of Systems Denies any suicidal ideas. Still somewhat anxious and depressed. Reports occasional headaches but at baseline, no worse than before. Otherwise review of systems is negative  Past Medical History:  Diagnosis Date  . Allergy   . Anemia    when had uterine fibroids  . Anxiety   . Cataract    very small  . Cholelithiasis   . DJD (degenerative joint disease)   . Dry eye syndrome   . Headache(784.0)   . Hyperlipidemia 01/20/2011  . Hypertension 09/29/2006  . Thrombocytopenia ([place]) 08/27/2011   hematology evaluation 08-2011, return prn  . Urticaria   . Vasomotor rhinitis     Past Surgical History:  Procedure Laterality Date  . ABDOMINAL HYSTERECTOMY     no oophorectomy  . APPENDECTOMY    . CHOLECYSTECTOMY N/A 06/06/2016   Procedure: LAPAROSCOPIC CHOLECYSTECTOMY WITH INTRAOPERATIVE CHOLANGIOGRAM;  Surgeon: Johnathan Hausen, MD;  Location: [facility];  Service: General;  Laterality: N/A;  . COLONOSCOPY    . DILATION AND CURETTAGE OF UTERUS    . mole removals    . TONSILLECTOMY    . UMBILICAL HERNIA REPAIR      Social History   Social History  . Marital status: Widowed    Spouse name: N/A  . Number of children: 2  . Years of education: N/A   Occupational History  . 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 Smoking status: Never Smoker  . Smokeless tobacco: Never Used  . Alcohol use No  . Drug use: No  . Sexual activity: Not on file   Other Topics Concern  . Not on file   Social History  Narrative   Her mother lives w/ her    husband had prostate ca , passed away 10/17/2013   takes care of mom, 15 y/o who is blind , and two other fam members              Allergies as of 02/05/2017      Reactions   Bee Venom Anaphylaxis   Doxycycline Hives, Itching   Penicillins Itching, Nausea And Vomiting, Swelling   syncope   Sertraline Other (See Comments)   Dry Mouth   Azithromycin Itching, Rash   Cerumenex [trolamine] Itching, Rash   Climara [estradiol] Itching, Rash   Nickel Rash      Medication List       Accurate as of 02/05/17 11:59 PM. Always use your most recent med list.          aspirin 81 MG tablet Take 81 mg by mouth daily with breakfast.   CALCIUM + D PO Take 2 tablets by mouth daily with lunch.   carvedilol 6.25 MG tablet Commonly known as:  COREG Take 1 tablet (6.25 mg total) by mouth 2 (two) times daily with a meal.   cycloSPORINE 0.05 % ophthalmic emulsion Commonly known as:  RESTASIS Place 1 drop into both eyes every 12 (twelve) hours.   DELSYM 30 MG/5ML liquid Generic drug:  dextromethorphan Take 15 mg by mouth as needed for cough.   diphenhydrAMINE 25 MG tablet Commonly known as:  BENADRYL Take 25 mg by mouth every 6 (six) hours as needed for allergies. Reported on 08/06/2015   EPINEPHrine 0.3 mg/0.3 mL Soaj injection Commonly known as:  EPIPEN 2-PAK Inject 0.3 mLs (0.3 mg total) into the muscle once.   famotidine 20 MG tablet Commonly known as:  PEPCID Take 1 tablet (20 mg total) by mouth 2 (two) times daily.   fluticasone 50 MCG/ACT nasal spray Commonly known as:  FLONASE Place 1-2 sprays into both nostrils daily as needed for allergies or rhinitis.   glucosamine-chondroitin 500-400 MG tablet Take 1 tablet by mouth 3 (three) times daily.   montelukast 10 MG tablet Commonly known as:  SINGULAIR Take 1 tablet (10 mg total) by mouth at bedtime.   multivitamin with minerals Tabs tablet Take 1 tablet by mouth daily with breakfast.     spironolactone 25 MG tablet Commonly known as:  ALDACTONE Take 1 tablet (25 mg total) by mouth daily.   SYSTANE ULTRA 0.4-0.3 % Soln Generic drug:  Polyethyl Glycol-Propyl Glycol Place 1 drop into both eyes daily as needed (dry eyes).          Objective:   Physical Exam BP 124/80 (BP Location: Left Arm, Patient Position: Sitting, Cuff Size: Small)   Pulse 79   Temp 97.8 F (36.6 C) (Oral)   Resp 14   Ht 5\' 2"  (1.575 m)   Wt 163 lb 4 oz (74 kg)   SpO2 97%   BMI 29.86 kg/m   General:   Well developed, well nourished . NAD.  Neck: No  thyromegaly  HEENT:  Normocephalic . Face symmetric, atraumatic Lungs:  CTA B Normal respiratory effort, no intercostal retractions, no accessory muscle use. Heart: RRR,  no murmur.  No pretibial edema bilaterally  Abdomen:  Not distended, soft, non-tender. No rebound or rigidity.   Skin: Exposed areas without rash. Not pale. Not jaundice Neurologic:  alert & oriented X3.  Speech normal, gait appropriate for age and unassisted Strength symmetric and appropriate for age.  Psych: Cognition and judgment appear intact.  Cooperative with normal attention span and concentration.  Behavior appropriate. No anxious or depressed appearing.    Assessment & Plan:    Assessment Prediabetes HTN Hyperlipidemia Thrombocytopenia, hematology eval 2013, RTC PRN Allergic rhinitis, urticaria-- Dr Donneta Romberg Dry eye syndrome Psych: --Anxiety-depression: d/t husband's health (lost husband 2015), taking care of fam members --Hoarding  Behavior --MCI dx By neuropsychology-- 2018 Cholecystitis and choledocholithiasis: 05-2016, s/p surgery, ERCP  PLAN: Prediabetes: Check A1c. HTN: BP today is very good, continue carvedilol Aldactone, check a BMP and FLP Hyperlipidemia: Diet control, checking labs Thrombocytopenia, h/o. Last platelet count satisfactory. Anxiety depression: Took Lexapro temporarily, unsure if it helped or if she has side effects.  Currently not suicidal and at baseline. Plans to see psychiatry 02/18/2017. Will let them decide about medication. Hoarding behavior: See above MCI, mild cognitive impairment: Status post neuropsychology eval 3, note reviewed, they recommended a brain MRI. Will do. They also refer her to a geriatric psychiatrist, a appointment pending.  RTC 6 months

## 2017-02-05 NOTE — Patient Instructions (Signed)
GO TO THE LAB : Get the blood work     GO TO THE FRONT DESK Schedule your next appointment for a  checkup in 6 months   Please schedule Medicare wellness with one of our nurses

## 2017-02-06 DIAGNOSIS — G3184 Mild cognitive impairment, so stated: Secondary | ICD-10-CM | POA: Insufficient documentation

## 2017-02-06 NOTE — Assessment & Plan Note (Signed)
Prediabetes: Check A1c. HTN: BP today is very good, continue carvedilol Aldactone, check a BMP and FLP Hyperlipidemia: Diet control, checking labs Thrombocytopenia, h/o. Last platelet count satisfactory. Anxiety depression: Took Lexapro temporarily, unsure if it helped or if she has side effects. Currently not suicidal and at baseline. Plans to see psychiatry 02/18/2017. Will let them decide about medication. Hoarding behavior: See above MCI, mild cognitive impairment: Status post neuropsychology eval 3, note reviewed, they recommended a brain MRI. Will do. They also refer her to a geriatric psychiatrist, a appointment pending.  RTC 6 months

## 2017-02-07 ENCOUNTER — Ambulatory Visit (HOSPITAL_BASED_OUTPATIENT_CLINIC_OR_DEPARTMENT_OTHER)
Admission: RE | Admit: 2017-02-07 | Discharge: 2017-02-07 | Disposition: A | Discharge status: Home / Self Care | Payer: Medicare Other | Source: Ambulatory Visit | Attending: Internal Medicine | Admitting: Internal Medicine

## 2017-02-07 DIAGNOSIS — G3184 Mild cognitive impairment, so stated: Secondary | ICD-10-CM | POA: Diagnosis not present

## 2017-02-07 DIAGNOSIS — M5030 Other cervical disc degeneration, unspecified cervical region: Secondary | ICD-10-CM | POA: Diagnosis not present

## 2017-02-07 DIAGNOSIS — R51 Headache: Secondary | ICD-10-CM | POA: Diagnosis not present

## 2017-02-15 IMAGING — US US ABDOMEN COMPLETE
1 series · 13 of 25 positions shown · non-contrast
Comparison: CT report from 12/27/2000

CLINICAL DATA: Midline abdominal pain radiating to central chest x1
week intermittently. Nausea intermittently. History of umbilical
surgery years ago.

EXAM:
ABDOMEN ULTRASOUND COMPLETE

[Series 1: us abdomen complete · 0.18mm/px · 13 of 90 slices shown]
[im 1/90]
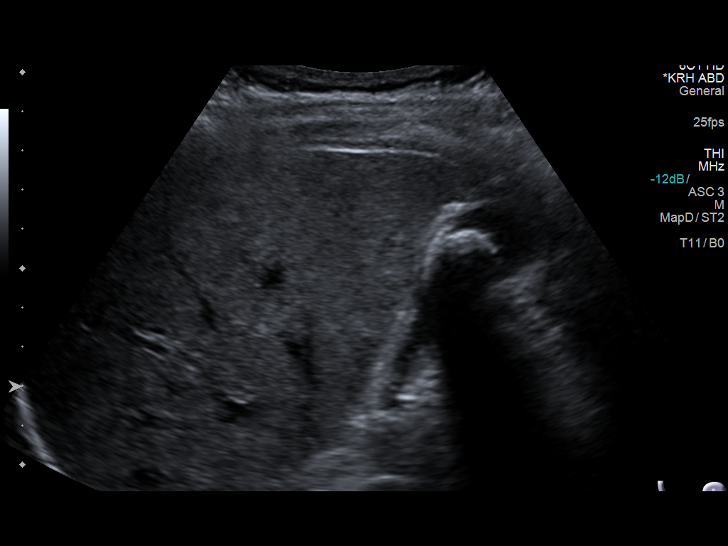
[im 8/90]
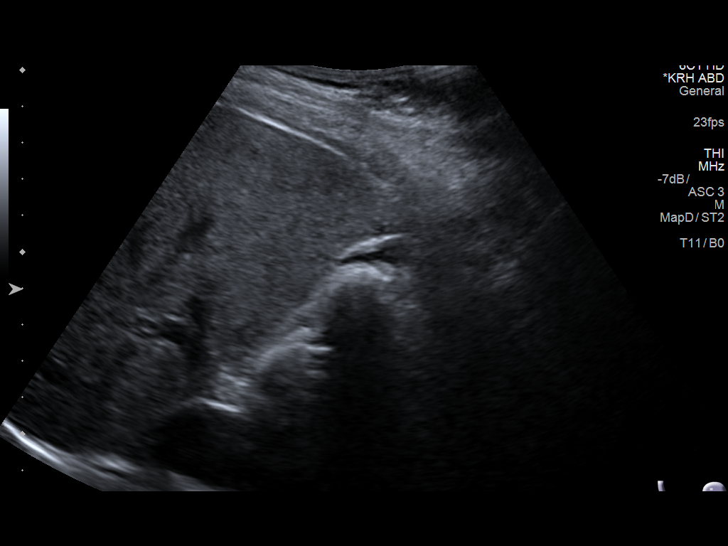
[im 15/90]
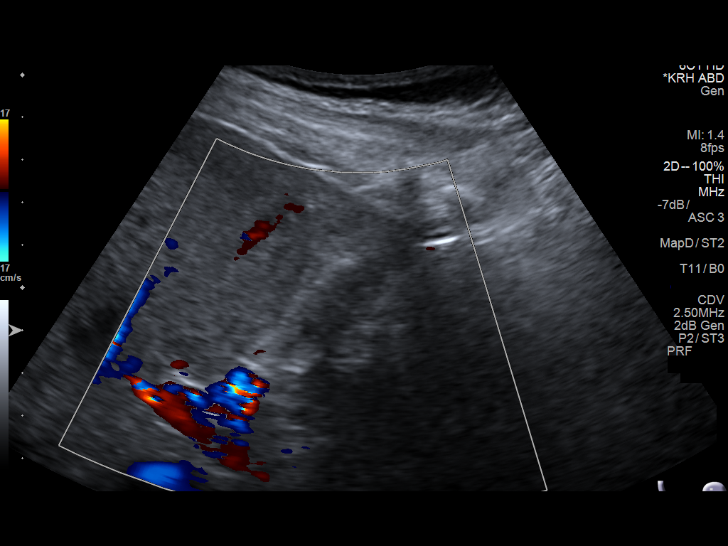
[im 23/90]
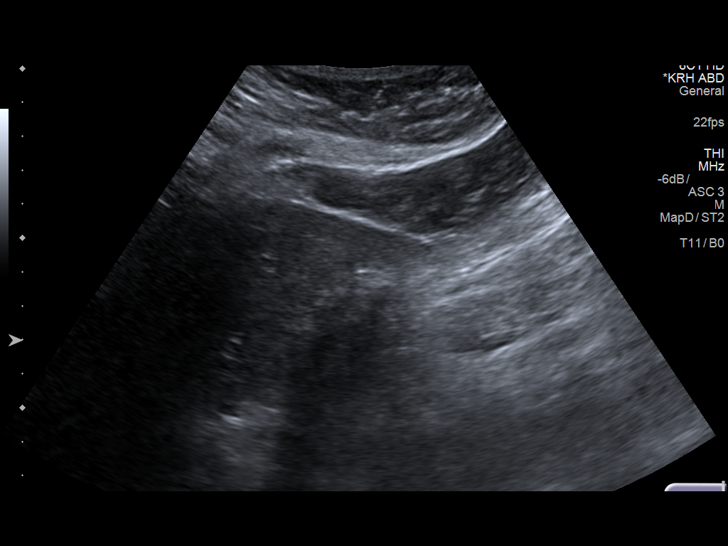
[im 30/90]
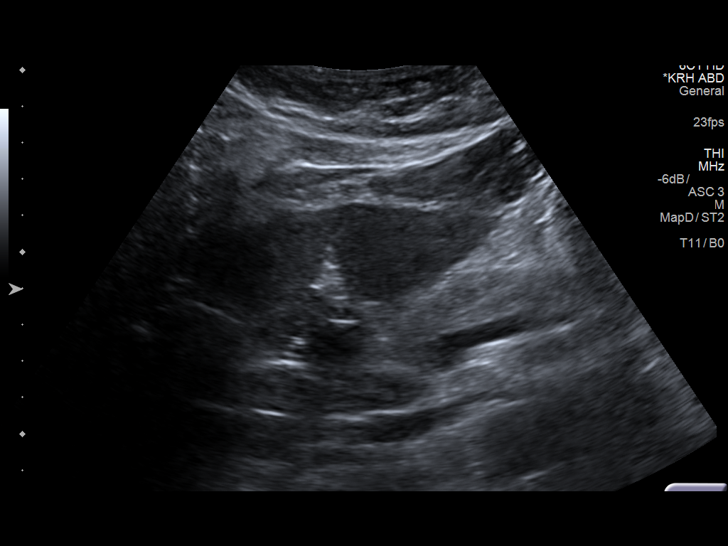
[im 38/90]
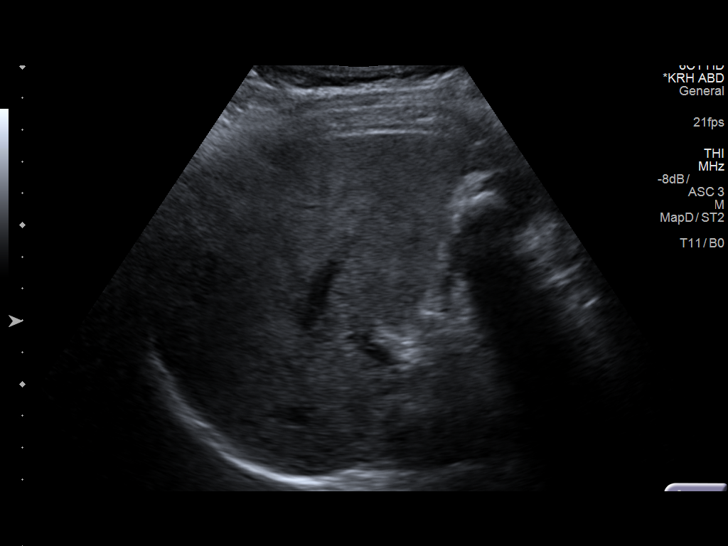
[im 45/90]
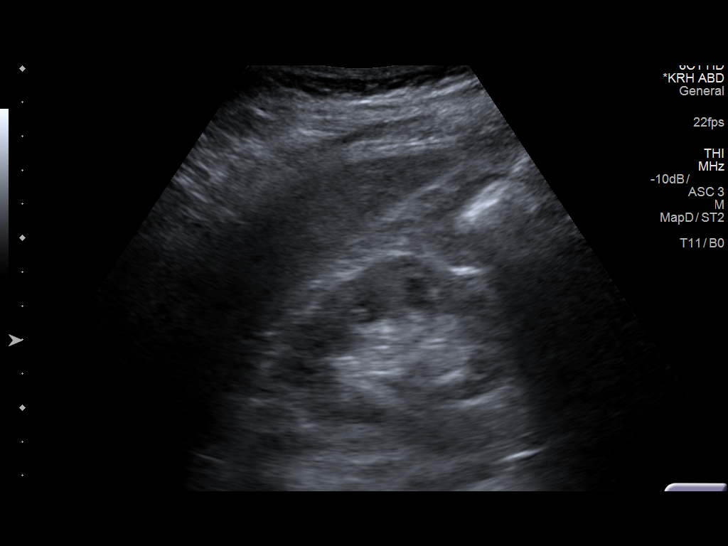
[im 52/90]
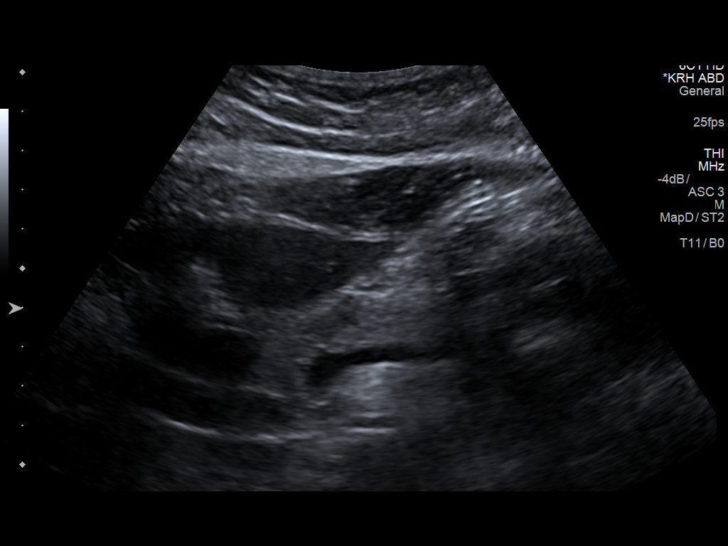
[im 60/90]
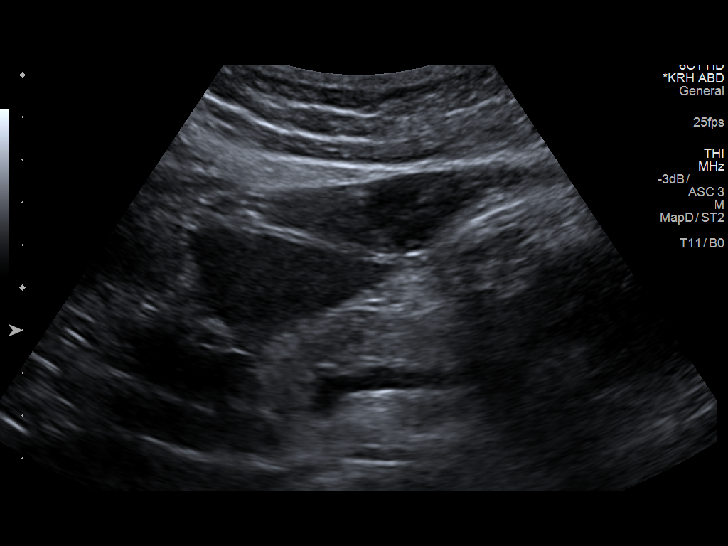
[im 67/90]
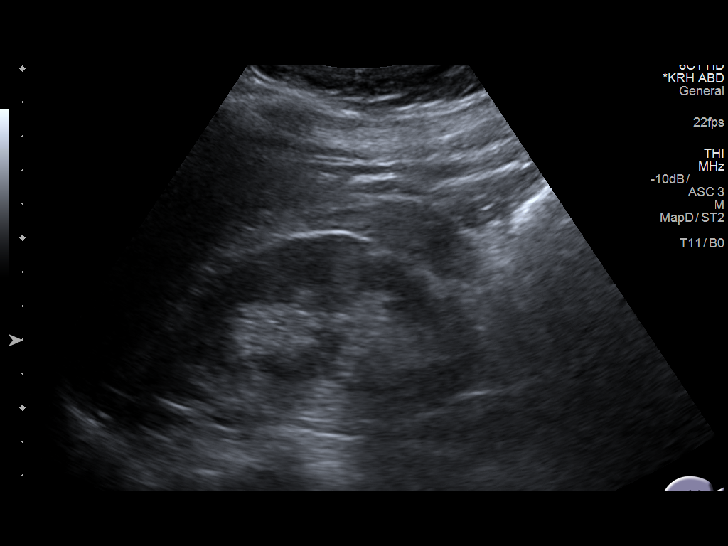
[im 75/90]
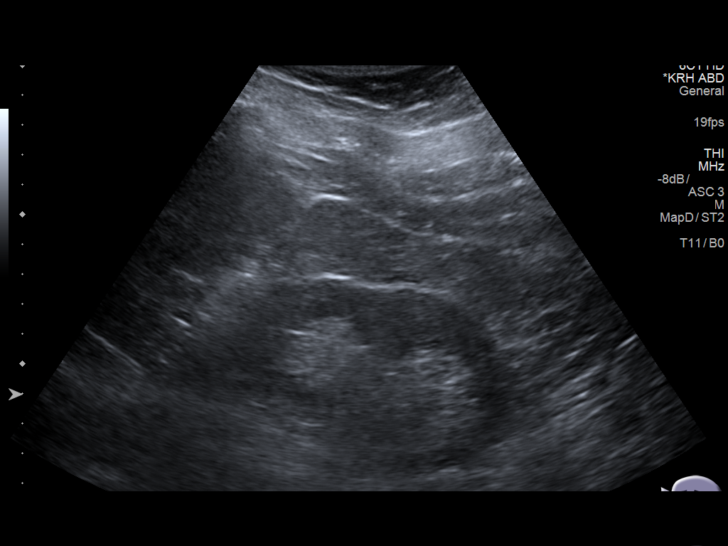
[im 82/90]
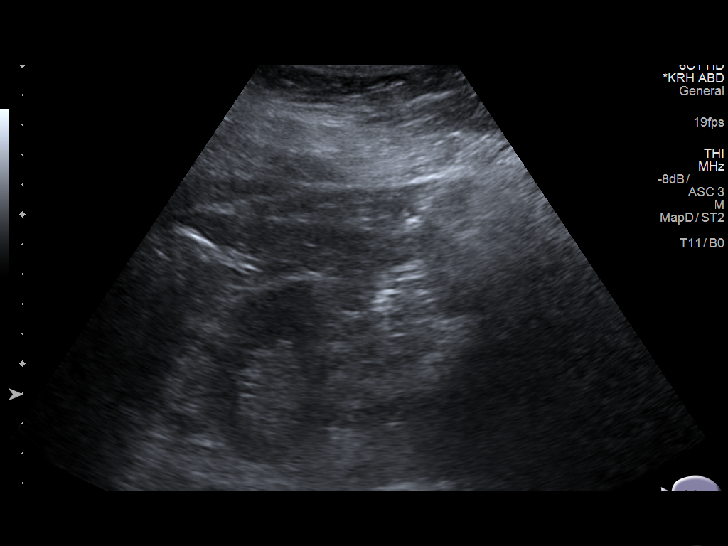
[im 90/90]
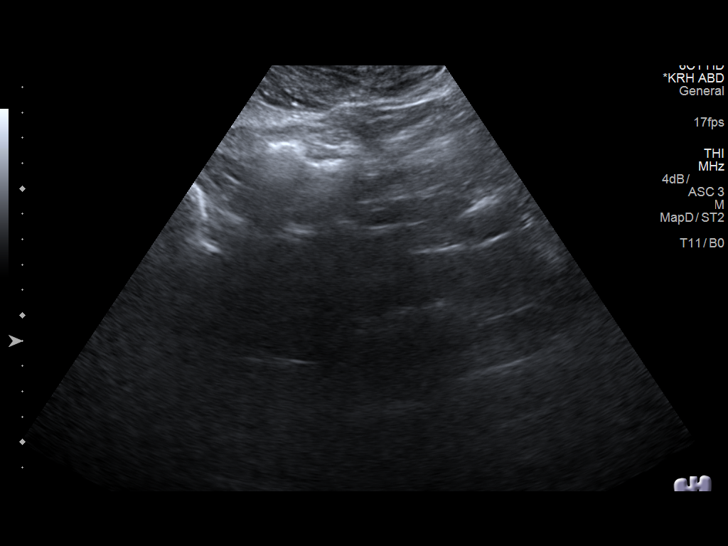

[13 of 25 positions shown; findings below may reference images not displayed]

FINDINGS: Gallbladder: The gallbladder is contracted. There is a gallstone in
the body of the gallbladder measuring 2.6 x 1.3 x 2.1 cm. No
sonographic Murphy's sign was elicited. Gallbladder wall thickness
is top normal at 3 mm.

Common bile duct: Diameter: Normal at 3.6 mm

Liver: Slightly coarsened and increased in echogenicity without
space-occupying mass or biliary dilatation.

IVC: No abnormality visualized.

Pancreas: There is an ovoid 1.3 x 0.4 cm area of hypoechogenicity
along the ventral pancreatic head and body.

Spleen: Size and appearance within normal limits.

Right Kidney: Length: 9 cm. Echogenicity within normal limits. No
mass or hydronephrosis visualized.

Left Kidney: Length: 9.9 cm. Echogenicity within normal limits. No
mass or hydronephrosis visualized.

Abdominal aorta: No aneurysm visualized.

Other findings: None.
IMPRESSION: Uncomplicated cholelithiasis with a gallstone measuring 2.6 x 1.3 x
2.1 cm noted. No secondary signs of acute cholecystitis.

1.3 x 0.4 cm of hypoechogenicity along the ventral pancreatic head
and body, nonspecific in etiology. CT

without and with IV contrast may help for further correlation. No
ductal dilatation is seen.

i

## 2017-02-17 DIAGNOSIS — E119 Type 2 diabetes mellitus without complications: Secondary | ICD-10-CM | POA: Diagnosis not present

## 2017-02-17 DIAGNOSIS — H04129 Dry eye syndrome of unspecified lacrimal gland: Secondary | ICD-10-CM | POA: Diagnosis not present

## 2017-02-17 LAB — HM DIABETES EYE EXAM

## 2017-02-18 ENCOUNTER — Encounter (HOSPITAL_COMMUNITY): Payer: Self-pay | Admitting: Psychiatry

## 2017-02-18 ENCOUNTER — Ambulatory Visit (INDEPENDENT_AMBULATORY_CARE_PROVIDER_SITE_OTHER): Payer: Medicare Other | Admitting: Psychiatry

## 2017-02-18 VITALS — BP 122/72 | HR 89 | Ht 62.0 in | Wt 163.2 lb

## 2017-02-18 DIAGNOSIS — F419 Anxiety disorder, unspecified: Secondary | ICD-10-CM

## 2017-02-18 DIAGNOSIS — G3184 Mild cognitive impairment, so stated: Secondary | ICD-10-CM

## 2017-02-18 DIAGNOSIS — F4323 Adjustment disorder with mixed anxiety and depressed mood: Secondary | ICD-10-CM

## 2017-02-18 MED ORDER — BUSPIRONE HCL 10 MG PO TABS: ORAL_TABLET | ORAL | 5 refills | Status: DC

## 2017-02-18 NOTE — Progress Notes (Signed)
Psychiatric Initial Adult Assessment   Patient Identification: Monique [name] MRN:  [id number] Date of Evaluation:  02/18/2017 Referral Source: Dr. Si Raider and Dr. Larose Kells Chief Complaint:   Visit Diagnosis: No diagnosis found.  History of Present Illness:    This patient is a 76 year old African-American female was asked to be evaluated after neuropsychological testing referred her. The patient was diagnosed with mild cognitive impairment. The patient presently lives with her mother is 48 years old and is visually impaired likely demented. The patient is still caregiver. She is also the power of attorney for her uncle who lives in another facility. The patient is been a widow 6 2015 after 71 year marriage. She is to at all children one married with 3 children the other married 2 children. She is 5 grandchildren and one great-grandchild. She says for the most part are doing well. One of her son's wife's is physically ill in the thoughts of care. The patient drives without problems. She still pulse. Recently her sister is doing the bills and the house. Other than this she sense to do most of her situational ADLs as well as all her basic ADLs. The patient denies being persistently depressed. She sleeping and eating well. She's got good energy. She is no problems thinking and concentrating. She denies worthlessness. She's not suicidal now and never has been. The patient enjoys seeing an acquired and is a book called. She loves watching TV specifically medications. The patient denies the use of alcohol or drugs. She's never had any symptoms of psychosis. In a close evaluation she denies ever having an episode of major depression. She denies symptoms of mania. She denies symptoms of generalized anxiety disorder panic disorder or obsessive-compulsive disorder. She admits to some hoardin behavior which is been present for decades. She has no other symptoms of OCD. The patient has no significant past psychiatric  history. She's never been in a psychiatric hospital or seen a psychiatrist. She's had some hospice repeat counseling the past but no individual therapy. The patient) county. She had a high school education and went to college for a few years. Ventricles breakthrough 20 years and went back to [employer] where she got a 4 year degree in religion. The patient really is functioning fairly well. Her biggest complaint is that of anxiety. Anxiety likely situational.  . Associated Signs/Symptoms: Depression Symptoms:  anxiety, (Hypo) Manic Symptoms:   Anxiety Symptoms:   Psychotic Symptoms:   PTSD Symptoms:   Past Psychiatric History: Past use of Lexapro and Zoloft which cause side effects. Previous Psychotropic Medications: Yes   Substance Abuse History in the last 12 months: No  Consequences of Substance Abuse: Negative  Past Medical History:  Past Medical History:  Diagnosis Date  . Allergy   . Anemia    when had uterine fibroids  . Anxiety   . Cataract    very small  . Cholelithiasis   . DJD (degenerative joint disease)   . Dry eye syndrome   . Headache(784.0)   . Hyperlipidemia 01/20/2011  . Hypertension 09/29/2006  . Thrombocytopenia ([place]) 08/27/2011   hematology evaluation 08-2011, return prn  . Urticaria   . Vasomotor rhinitis     Past Surgical History:  Procedure Laterality Date  . ABDOMINAL HYSTERECTOMY     no oophorectomy  . APPENDECTOMY    . CHOLECYSTECTOMY N/A 06/06/2016   Procedure: LAPAROSCOPIC CHOLECYSTECTOMY WITH INTRAOPERATIVE CHOLANGIOGRAM;  Surgeon: Johnathan Hausen, MD;  Location: [facility];  Service: General;  Laterality: N/A;  .  COLONOSCOPY    . DILATION AND CURETTAGE OF UTERUS    . mole removals    . TONSILLECTOMY    . UMBILICAL HERNIA REPAIR      Family Psychiatric History:   Family History:  Family History  Problem Relation Age of Onset  . Coronary artery disease Father   . Cancer Father        unsure of type  . Other Father         Guillain- barre syndrome  . Diabetes Other        uncles   . Hypertension Mother   . Anxiety disorder Mother   . Depression Mother   . Prostate cancer Other        grandfather  . Breast cancer Sister 48  . Depression Maternal Uncle   . Colon cancer Neg Hx   . Colon polyps Neg Hx   . Rectal cancer Neg Hx   . Stomach cancer Neg Hx     Social History:   Social History   Social History  . Marital status: Widowed    Spouse name: N/A  . Number of children: 2  . Years of education: N/A   Occupational History  . 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 Smoking status: Never Smoker  . Smokeless tobacco: Never Used  . Alcohol use No  . Drug use: No  . Sexual activity: Not Asked   Other Topics Concern  . None   Social History Narrative   Her mother lives w/ her    husband had prostate ca , passed away 21-Oct-2013   takes care of mom, 57 y/o who is blind , and two other fam members            Additional Social History:   Allergies:   Allergies  Allergen Reactions  . Bee Venom Anaphylaxis  . Doxycycline Hives and Itching  . Penicillins Itching, Nausea And Vomiting and Swelling    syncope  . Sertraline Other (See Comments)    Dry Mouth  . Azithromycin Itching and Rash  . Cerumenex [Trolamine] Itching and Rash  . Climara [Estradiol] Itching and Rash  . Nickel Rash    Metabolic Disorder Labs: Lab Results  Component Value Date   HGBA1C 6.0 02/05/2017   No results found for: PROLACTIN Lab Results  Component Value Date   CHOL 158 02/05/2017   TRIG 103.0 02/05/2017   HDL 51.50 02/05/2017   CHOLHDL 3 02/05/2017   VLDL 20.6 02/05/2017   LDLCALC 86 02/05/2017   LDLCALC 90 08/06/2015     Current Medications: Current Outpatient Prescriptions  Medication Sig Dispense Refill  . aspirin 81 MG tablet Take 81 mg by mouth daily with breakfast.     . Calcium-Vitamin D (CALCIUM + D PO) Take 2 tablets by mouth daily with lunch.     . carvedilol  (COREG) 6.25 MG tablet Take 1 tablet (6.25 mg total) by mouth 2 (two) times daily with a meal. 180 tablet 3  . cycloSPORINE (RESTASIS) 0.05 % ophthalmic emulsion Place 1 drop into both eyes every 12 (twelve) hours.     Marland Kitchen dextromethorphan (DELSYM) 30 MG/5ML liquid Take 15 mg by mouth as needed for cough.    . diphenhydrAMINE (BENADRYL) 25 MG tablet Take 25 mg by mouth every 6 (six) hours as needed for allergies. Reported on 08/06/2015    . EPINEPHrine (EPIPEN 2-PAK) 0.3 mg/0.3 mL IJ SOAJ injection Inject  0.3 mLs (0.3 mg total) into the muscle once. 1 Device 1  . famotidine (PEPCID) 20 MG tablet Take 1 tablet (20 mg total) by mouth 2 (two) times daily. (Patient taking differently: Take 20 mg by mouth daily. ) 20 tablet 0  . fluticasone (FLONASE) 50 MCG/ACT nasal spray Place 1-2 sprays into both nostrils daily as needed for allergies or rhinitis.    Marland Kitchen glucosamine-chondroitin 500-400 MG tablet Take 1 tablet by mouth 3 (three) times daily.      . montelukast (SINGULAIR) 10 MG tablet Take 1 tablet (10 mg total) by mouth at bedtime.    . Multiple Vitamin (MULTIVITAMIN WITH MINERALS) TABS Take 1 tablet by mouth daily with breakfast.    . Polyethyl Glycol-Propyl Glycol (SYSTANE ULTRA) 0.4-0.3 % SOLN Place 1 drop into both eyes daily as needed (dry eyes).     Marland Kitchen spironolactone (ALDACTONE) 25 MG tablet Take 1 tablet (25 mg total) by mouth daily. 90 tablet 3  . busPIRone (BUSPAR) 10 MG tablet 1 bid  With meals for 1 week then 1  qam  2 with diner 90 tablet 5   No current facility-administered medications for this visit.     Neurologic: Headache: No Seizure: No Paresthesias:No  Musculoskeletal: Strength & Muscle Tone: within normal limits Gait & Station: normal Patient leans right  Psychiatric Specialty Exam: ROS  Blood pressure 122/72, pulse 89, height 5\' 2"  (1.575 m), weight 163 lb 3.2 oz (74 kg).Body mass index is 29.85 kg/m.  General Appearance: Casual  Eye Contact:  Good  Speech:  Clear and  Coherent  Volume:   Mood:  Negative  Affect:  Congruent  Thought Process:  Goal Directed  Orientation:  NA  Thought Content:  Logical  Suicidal Thoughts:  No  Homicidal Thoughts:  No  Memory:  Negative  Judgement:  Fair  Insight:  Good  Psychomotor Activity:  Normal  Concentration:    Recall:  Good  Fund of Knowledge:Good  Language: Fair  Akathisia:  No  Handed:  Right  AIMS (if indicated):    Assets:  Communication Skills  ADL's:  Intact  Cognition: WNL  Sleep:      Treatment Plan Summary: At this time the plan will be to the patient return in approximately 2 months with her son Lavone Neri  who seems to have a lot of concerns about his mother. Her anxiety seems to be very mild. He doesn't really cause too much dysfunction. She is distressed by a lot of anxiety she's had but doesn't really affect are dramatic way. For now we will engage her to treatment but beginning her on BuSpar 10 mg in the morning and 20 mg at dinner I shared with his use for anxiety affect her or cognitively cause her oversedated.  patient agreed to start his medications and return to see me in a few months with her son. At that time we'll get his physician and his feelings about how his mother is doing. I think the issue of hoarding  problems and doesn't have much clinical significance. Today we read thoroughly the neuropsychological test she had done.    Jerral Ralph, MD 8/22/20184:46 PM

## 2017-02-21 DIAGNOSIS — Z23 Encounter for immunization: Secondary | ICD-10-CM | POA: Diagnosis not present

## 2017-03-03 ENCOUNTER — Encounter: Payer: Self-pay | Admitting: Internal Medicine

## 2017-03-06 ENCOUNTER — Other Ambulatory Visit: Payer: Self-pay | Admitting: Internal Medicine

## 2017-03-09 ENCOUNTER — Other Ambulatory Visit: Payer: Self-pay | Admitting: Internal Medicine

## 2017-04-22 ENCOUNTER — Ambulatory Visit (INDEPENDENT_AMBULATORY_CARE_PROVIDER_SITE_OTHER): Payer: Medicare Other | Admitting: Psychiatry

## 2017-04-22 ENCOUNTER — Encounter (HOSPITAL_COMMUNITY): Payer: Self-pay | Admitting: Psychiatry

## 2017-04-22 VITALS — BP 132/78 | HR 86 | Ht 62.0 in | Wt 166.0 lb

## 2017-04-22 DIAGNOSIS — Z636 Dependent relative needing care at home: Secondary | ICD-10-CM

## 2017-04-22 DIAGNOSIS — Z818 Family history of other mental and behavioral disorders: Secondary | ICD-10-CM | POA: Diagnosis not present

## 2017-04-22 DIAGNOSIS — F4323 Adjustment disorder with mixed anxiety and depressed mood: Secondary | ICD-10-CM | POA: Diagnosis not present

## 2017-04-22 DIAGNOSIS — Z634 Disappearance and death of family member: Secondary | ICD-10-CM

## 2017-04-22 MED ORDER — BUSPIRONE HCL 10 MG PO TABS: ORAL_TABLET | ORAL | 5 refills | Status: DC

## 2017-04-22 NOTE — Progress Notes (Signed)
Psychiatric Initial Adult Assessment   Patient Identification: Monique [name] MRN:  [id number] Date of Evaluation:  04/22/2017 Referral Source: Dr. Si Raider and Dr. Larose Kells Chief Complaint:   Visit Diagnosis: No diagnosis found.  History of Present   Today the patient is doing well. She says since being on BuSparHer anxiety is less. His results and focus better. She can concentrate better. Complete past. Her biggest passes to take care of her elderly mother.her mother is visually impaired and likely has some degree of dementia.Marland KitchenUnfortunately the patient has had a number of family deaths her last visit.patient says she sleeping and eating well. She's got good energy. She is very active. She just joined a book club. She is able to read now. Patient is also part of the choir. He become more active. She shows no evidence of psychosis. She drinks no alcohol. She is very stable. She takes her medicines just as prescribed. She denies any chest pain or shortness of breath or any physical lines. Depression Symptoms:  anxiety, (Hypo) Manic Symptoms:   Anxiety Symptoms:   Psychotic Symptoms:   PTSD Symptoms:   Past Psychiatric History: Past use of Lexapro and Zoloft which cause side effects. Previous Psychotropic Medications: Yes   Substance Abuse History in the last 12 months: No  Consequences of Substance Abuse: Negative  Past Medical History:  Past Medical History:  Diagnosis Date  . Allergy   . Anemia    when had uterine fibroids  . Anxiety   . Cataract    very small  . Cholelithiasis   . DJD (degenerative joint disease)   . Dry eye syndrome   . Headache(784.0)   . Hyperlipidemia 01/20/2011  . Hypertension 09/29/2006  . Thrombocytopenia ([place]) 08/27/2011   hematology evaluation 08-2011, return prn  . Urticaria   . Vasomotor rhinitis     Past Surgical History:  Procedure Laterality Date  . ABDOMINAL HYSTERECTOMY     no oophorectomy  . APPENDECTOMY    . CHOLECYSTECTOMY N/A  06/06/2016   Procedure: LAPAROSCOPIC CHOLECYSTECTOMY WITH INTRAOPERATIVE CHOLANGIOGRAM;  Surgeon: Johnathan Hausen, MD;  Location: [facility];  Service: General;  Laterality: N/A;  . COLONOSCOPY    . DILATION AND CURETTAGE OF UTERUS    . mole removals    . TONSILLECTOMY    . UMBILICAL HERNIA REPAIR      Family Psychiatric History:   Family History:  Family History  Problem Relation Age of Onset  . Coronary artery disease Father   . Cancer Father        unsure of type  . Other Father        Guillain- barre syndrome  . Diabetes Other        uncles   . Hypertension Mother   . Anxiety disorder Mother   . Depression Mother   . Prostate cancer Other        grandfather  . Breast cancer Sister 26  . Depression Maternal Uncle   . Colon cancer Neg Hx   . Colon polyps Neg Hx   . Rectal cancer Neg Hx   . Stomach cancer Neg Hx     Social History:   Social History   Social History  . Marital status: Widowed    Spouse name: N/A  . Number of children: 2  . Years of education: N/A   Occupational History  . 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 Smoking status: Never Smoker  .  Smokeless tobacco: Never Used  . Alcohol use No  . Drug use: No  . Sexual activity: No   Other Topics Concern  . None   Social History Narrative   Her mother lives w/ her    husband had prostate ca , passed away Oct 14, 2013   takes care of mom, 76 y/o who is blind , and two other fam members            Additional Social History:   Allergies:   Allergies  Allergen Reactions  . Bee Venom Anaphylaxis  . Doxycycline Hives and Itching  . Penicillins Itching, Nausea And Vomiting and Swelling    syncope  . Sertraline Other (See Comments)    Dry Mouth  . Azithromycin Itching and Rash  . Cerumenex [Trolamine] Itching and Rash  . Climara [Estradiol] Itching and Rash  . Nickel Rash    Metabolic Disorder Labs: Lab Results  Component Value Date   HGBA1C 6.0 02/05/2017   No  results found for: PROLACTIN Lab Results  Component Value Date   CHOL 158 02/05/2017   TRIG 103.0 02/05/2017   HDL 51.50 02/05/2017   CHOLHDL 3 02/05/2017   VLDL 20.6 02/05/2017   LDLCALC 86 02/05/2017   LDLCALC 90 08/06/2015     Current Medications: Current Outpatient Prescriptions  Medication Sig Dispense Refill  . aspirin 81 MG tablet Take 81 mg by mouth daily with breakfast.     . busPIRone (BUSPAR) 10 MG tablet 1 qam  2  qpm with meals 90 tablet 5  . Calcium-Vitamin D (CALCIUM + D PO) Take 2 tablets by mouth daily with lunch.     . carvedilol (COREG) 6.25 MG tablet Take 1 tablet (6.25 mg total) by mouth 2 (two) times daily with a meal. 180 tablet 1  . cycloSPORINE (RESTASIS) 0.05 % ophthalmic emulsion Place 1 drop into both eyes every 12 (twelve) hours.     Marland Kitchen dextromethorphan (DELSYM) 30 MG/5ML liquid Take 15 mg by mouth as needed for cough.    . diphenhydrAMINE (BENADRYL) 25 MG tablet Take 25 mg by mouth every 6 (six) hours as needed for allergies. Reported on 08/06/2015    . EPINEPHrine (EPIPEN 2-PAK) 0.3 mg/0.3 mL IJ SOAJ injection Inject 0.3 mLs (0.3 mg total) into the muscle once. 1 Device 1  . famotidine (PEPCID) 20 MG tablet Take 1 tablet (20 mg total) by mouth 2 (two) times daily. (Patient taking differently: Take 20 mg by mouth daily. ) 20 tablet 0  . fluticasone (FLONASE) 50 MCG/ACT nasal spray Place 1-2 sprays into both nostrils daily as needed for allergies or rhinitis.    Marland Kitchen glucosamine-chondroitin 500-400 MG tablet Take 1 tablet by mouth 3 (three) times daily.      . montelukast (SINGULAIR) 10 MG tablet Take 1 tablet (10 mg total) by mouth at bedtime.    . Multiple Vitamin (MULTIVITAMIN WITH MINERALS) TABS Take 1 tablet by mouth daily with breakfast.    . Polyethyl Glycol-Propyl Glycol (SYSTANE ULTRA) 0.4-0.3 % SOLN Place 1 drop into both eyes daily as needed (dry eyes).     Marland Kitchen spironolactone (ALDACTONE) 25 MG tablet Take 1 tablet (25 mg total) by mouth daily. 90 tablet 2    No current facility-administered medications for this visit.     Neurologic: Headache: No Seizure: No Paresthesias:No  Musculoskeletal: Strength & Muscle Tone: within normal limits Gait & Station: normal Patient leans right  Psychiatric Specialty Exam: ROS  Blood pressure 132/78, pulse 86, height 5\' 2"  (  1.575 m), weight 166 lb (75.3 kg).Body mass index is 30.36 kg/m.  General Appearance: Casual  Eye Contact:  Good  Speech:  Clear and Coherent  Volume:   Mood:  Negative  Affect:  Congruent  Thought Process:  Goal Directed  Orientation:  NA  Thought Content:  Logical  Suicidal Thoughts:  No  Homicidal Thoughts:  No  Memory:  Negative  Judgement:  Fair  Insight:  Good  Psychomotor Activity:  Normal  Concentration:    Recall:  Good  Fund of Knowledge:Good  Language: Fair  Akathisia:  No  Handed:  Right  AIMS (if indicated):    Assets:  Communication Skills  ADL's:  Intact  Cognition: WNL  Sleep:      Treatment Plan Summary: Today unfortunately her son did not make it. Her target symptom is anxiety which seems to improve taking 10 mg her morning 20 mg at suppertime. There some issues of hording. I think that clinically significant at this time.the patient seems to be functioning extremely well. She is positive optimistic that much less anxious. She'll continue taking the BuSpar as prescribed and return to see me in 4 months.   Jerral Ralph, MD 10/24/20184:58 PM

## 2017-08-05 ENCOUNTER — Ambulatory Visit: Payer: Medicare Other | Admitting: Internal Medicine

## 2017-08-11 NOTE — Progress Notes (Addendum)
Subjective:   Monique [name] is a 77 y.o. female who presents for Medicare Annual (Subsequent) preventive examination.  Review of Systems: No ROS.  Medicare Wellness Visit. Additional risk factors are reflected in the social history. Cardiac Risk Factors include: advanced age (>20men, >90 women);dyslipidemia;hypertension;sedentary lifestyle Sleep patterns: sleeps well per pt.  Home Safety/Smoke Alarms: Feels safe in home. Smoke alarms in place.  Living environment; residence and Firearm Safety: Mother live with pt. Pt is caregiver. 1 story home.  Seat Belt Safety/Bike Helmet: Wears seat belt.   Female:   Pap- No longer doing routine screening due to age.    Mammo- pt reports last 07/02/17-normal. [facility].       Dexa scan- declines at this time.       CCS- last 03/20/16: No longer doing routine screening due to age. Dentist-every 6 months. Dr.Saral     Objective:     Vitals: BP 132/70   Pulse 74   Wt 172 lb (78 kg)   SpO2 97%   BMI 31.46 kg/m   Body mass index is 31.46 kg/m.  Advanced Directives 08/17/2017 06/07/2016 03/06/2016 06/18/2014 05/13/2014  Does Patient Have a Medical Advance Directive? Yes Yes Yes No Yes  Type of Paramedic of [place];Living will [place];Living will Living will;Healthcare Power of [place] will  Does patient want to make changes to medical advance directive? No - Patient declined No - Patient declined - - -  Copy of [place] in Chart? No - copy requested No - copy requested - - -  Would patient like information on creating a medical advance directive? - - - No - patient declined information -    Tobacco Social History   Tobacco Use  Smoking Status Never Smoker  Smokeless Tobacco Never Used     Counseling given: Not Answered   Clinical Intake: Pain : No/denies pain     Past Medical History:  Diagnosis Date  . Allergy   . Anemia    when had uterine  fibroids  . Anxiety   . Cataract    very small  . Cholelithiasis   . DJD (degenerative joint disease)   . Dry eye syndrome   . Headache(784.0)   . Hyperlipidemia 01/20/2011  . Hypertension 09/29/2006  . Substance abuse ([place])   . Thrombocytopenia ([place]) 08/27/2011   hematology evaluation 08-2011, return prn  . Urticaria   . Vasomotor rhinitis    Past Surgical History:  Procedure Laterality Date  . ABDOMINAL HYSTERECTOMY     no oophorectomy  . APPENDECTOMY    . CHOLECYSTECTOMY N/A 06/06/2016   Procedure: LAPAROSCOPIC CHOLECYSTECTOMY WITH INTRAOPERATIVE CHOLANGIOGRAM;  Surgeon: Johnathan Hausen, MD;  Location: [facility];  Service: General;  Laterality: N/A;  . COLONOSCOPY    . DILATION AND CURETTAGE OF UTERUS    . mole removals    . TONSILLECTOMY    . UMBILICAL HERNIA REPAIR     Family History  Problem Relation Age of Onset  . Coronary artery disease Father   . Cancer Father        unsure of type  . Other Father        Guillain- barre syndrome  . Diabetes Other        uncles   . Hypertension Mother   . Anxiety disorder Mother   . Depression Mother   . Prostate cancer Other        grandfather  . Breast cancer  Sister 15  . Depression Maternal Uncle   . Colon cancer Neg Hx   . Colon polyps Neg Hx   . Rectal cancer Neg Hx   . Stomach cancer Neg Hx    Social History   Socioeconomic History  . Marital status: Widowed    Spouse name: None  . Number of children: 2  . Years of education: None  . Highest education level: None  Social Needs  . Financial resource strain: None  . Food insecurity - worry: None  . Food insecurity - inability: None  . Transportation needs - medical: None  . Transportation needs - non-medical: None  Occupational History  . 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 Smoking status: Never Smoker  . Smokeless tobacco: Never Used  Substance and Sexual Activity  . Alcohol use: No  . Drug use: No  . Sexual activity: No    Other Topics Concern  . None  Social History Narrative   Her mother lives w/ her    husband had prostate ca , passed away Oct 06, 2013   takes care of mom, 15 y/o who is blind , and two other fam members            Outpatient Encounter Medications as of 08/17/2017  Medication Sig  . aspirin 81 MG tablet Take 81 mg by mouth daily with breakfast.   . busPIRone (BUSPAR) 10 MG tablet 1 qam  2  qpm with meals  . Calcium-Vitamin D (CALCIUM + D PO) Take 2 tablets by mouth daily with lunch.   . carvedilol (COREG) 6.25 MG tablet Take 1 tablet (6.25 mg total) by mouth 2 (two) times daily with a meal.  . cycloSPORINE (RESTASIS) 0.05 % ophthalmic emulsion Place 1 drop into both eyes every 12 (twelve) hours.   Marland Kitchen dextromethorphan (DELSYM) 30 MG/5ML liquid Take 15 mg by mouth as needed for cough.  . diphenhydrAMINE (BENADRYL) 25 MG tablet Take 25 mg by mouth every 6 (six) hours as needed for allergies. Reported on 08/06/2015  . EPINEPHrine (EPIPEN 2-PAK) 0.3 mg/0.3 mL IJ SOAJ injection Inject 0.3 mLs (0.3 mg total) into the muscle once. (Patient not taking: Reported on 08/17/2017)  . famotidine (PEPCID) 20 MG tablet Take 1 tablet (20 mg total) by mouth 2 (two) times daily. (Patient taking differently: Take 20 mg by mouth daily. )  . fluticasone (FLONASE) 50 MCG/ACT nasal spray Place 1-2 sprays into both nostrils daily as needed for allergies or rhinitis.  Marland Kitchen glucosamine-chondroitin 500-400 MG tablet Take 1 tablet by mouth 3 (three) times daily.    . montelukast (SINGULAIR) 10 MG tablet Take 1 tablet (10 mg total) by mouth at bedtime.  . Multiple Vitamin (MULTIVITAMIN WITH MINERALS) TABS Take 1 tablet by mouth daily with breakfast.  . Polyethyl Glycol-Propyl Glycol (SYSTANE ULTRA) 0.4-0.3 % SOLN Place 1 drop into both eyes daily as needed (dry eyes).   Marland Kitchen spironolactone (ALDACTONE) 25 MG tablet Take 1 tablet (25 mg total) by mouth daily.   No facility-administered encounter medications on file as of 08/17/2017.      Activities of Daily Living In your present state of health, do you have any difficulty performing the following activities: 08/17/2017  Hearing? N  Vision? N  Comment wearing glasses. Eye exams yearly.  Difficulty concentrating or making decisions? N  Walking or climbing stairs? N  Dressing or bathing? N  Doing errands, shopping? N  Preparing Food and eating ? N  Using the Toilet? N  In the past six months, have you accidently leaked urine? N  Do you have problems with loss of bowel control? N  Managing your Medications? N  Managing your Finances? N  Housekeeping or managing your Housekeeping? N  Some recent data might be hidden    Patient Care Team: Colon Branch, MD as PCP - General Webb Laws, OD as Referring Physician (Optometry) Mosetta Anis, MD as Referring Physician (Allergy) Sanjuana Kava, MD as Referring Physician (Obstetrics and Gynecology) Hilarie Fredrickson, Lajuan Lines, MD as Consulting Physician (Gastroenterology) Norma Fredrickson, MD as Consulting Physician (Psychiatry) Renda Rolls, Jennefer Bravo, MD as Referring Physician (Dermatology)    Assessment:   This is a routine wellness examination for [place]. Physical assessment deferred to PCP.  Exercise Activities and Dietary recommendations Current Exercise Habits: The patient does not participate in regular exercise at present, Exercise limited by: None identified   Diet (meal preparation, eat out, water intake, caffeinated beverages, dairy products, fruits and vegetables): Pt states she eats fruits and vegetables each day. Reports she needs to drink more water.   Goals    . Weight (lb) < 160 lb (72.6 kg)       Fall Risk Fall Risk  08/17/2017 09/10/2016 02/04/2016 08/06/2015 12/06/2014  Falls in the past year? Yes No No No No  Number falls in past yr: 1 - - - -  Injury with Fall? - - - - -  Follow up Education provided;Falls prevention discussed - - - -   Depression Screen PHQ 2/9 Scores 08/17/2017 09/10/2016 02/04/2016  08/06/2015  PHQ - 2 Score 1 5 0 0  PHQ- 9 Score - 18 - -     Cognitive Function MMSE - Mini Mental State Exam 08/17/2017  Orientation to time 5  Orientation to Place 5  Registration 3  Attention/ Calculation 5  Recall 2  Language- name 2 objects 2  Language- repeat 1  Language- follow 3 step command 3  Language- read & follow direction 1  Write a sentence 1  Copy design 1  Total score 29        Immunization History  Administered Date(s) Administered  . Influenza Split 05/02/2014  . Influenza Whole 05/14/2007, 04/09/2009, 04/03/2010  . Influenza, High Dose Seasonal PF 04/06/2015  . Influenza, Seasonal, Injecte, Preservative Fre 04/12/2013  . Influenza-Unspecified 08/17/2017  . Pneumococcal Conjugate-13 11/30/2013  . Pneumococcal Polysaccharide-23 06/30/2001, 05/14/2007  . Td 10/08/2004  . Tdap 04/12/2013  . Zoster 06/30/2005   Screening Tests Health Maintenance  Topic Date Due  . DEXA SCAN  07/07/2005  . MAMMOGRAM  11/26/2016  . TETANUS/TDAP  04/13/2023  . INFLUENZA VACCINE  Completed  . PNA vac Low Risk Adult  Completed       Plan:   Follow up with Dr.Paz as directed  Continue to eat heart healthy diet (full of fruits, vegetables, whole grains, lean protein, water--limit salt, fat, and sugar intake) and increase physical activity as tolerated.  Continue doing brain stimulating activities (puzzles, reading, adult coloring books, staying active) to keep memory sharp.   Bring a copy of your living will and/or healthcare power of attorney to your next office visit.   I have personally reviewed and noted the following in the patient's chart:   . Medical and social history . Use of alcohol, tobacco or illicit drugs  . Current medications and supplements . Functional ability and status . Nutritional status . Physical activity . Advanced directives . List of other physicians . Hospitalizations,  surgeries, and ER visits in previous 12  months . Vitals . Screenings to include cognitive, depression, and falls . Referrals and appointments  In addition, I have reviewed and discussed with patient certain preventive protocols, quality metrics, and best practice recommendations. A written personalized care plan for preventive services as well as general preventive health recommendations were provided to patient.     Naaman Plummer [place], [place]  08/17/2017  Kathlene November, MD

## 2017-08-13 DIAGNOSIS — L918 Other hypertrophic disorders of the skin: Secondary | ICD-10-CM | POA: Diagnosis not present

## 2017-08-13 DIAGNOSIS — D1801 Hemangioma of skin and subcutaneous tissue: Secondary | ICD-10-CM | POA: Diagnosis not present

## 2017-08-13 DIAGNOSIS — L821 Other seborrheic keratosis: Secondary | ICD-10-CM | POA: Diagnosis not present

## 2017-08-13 DIAGNOSIS — D225 Melanocytic nevi of trunk: Secondary | ICD-10-CM | POA: Diagnosis not present

## 2017-08-13 DIAGNOSIS — Z23 Encounter for immunization: Secondary | ICD-10-CM | POA: Diagnosis not present

## 2017-08-17 ENCOUNTER — Encounter: Payer: Self-pay | Admitting: Internal Medicine

## 2017-08-17 ENCOUNTER — Ambulatory Visit (INDEPENDENT_AMBULATORY_CARE_PROVIDER_SITE_OTHER): Payer: PPO | Admitting: Internal Medicine

## 2017-08-17 ENCOUNTER — Encounter: Payer: Self-pay | Admitting: *Deleted

## 2017-08-17 ENCOUNTER — Ambulatory Visit (INDEPENDENT_AMBULATORY_CARE_PROVIDER_SITE_OTHER): Payer: PPO | Admitting: *Deleted

## 2017-08-17 VITALS — BP 132/70 | HR 74 | Wt 172.0 lb

## 2017-08-17 VITALS — BP 132/70 | HR 74 | Temp 97.4°F | Resp 14 | Ht 62.0 in | Wt 172.5 lb

## 2017-08-17 DIAGNOSIS — I10 Essential (primary) hypertension: Secondary | ICD-10-CM

## 2017-08-17 DIAGNOSIS — F329 Major depressive disorder, single episode, unspecified: Secondary | ICD-10-CM

## 2017-08-17 DIAGNOSIS — F419 Anxiety disorder, unspecified: Secondary | ICD-10-CM

## 2017-08-17 DIAGNOSIS — F423 Hoarding disorder: Secondary | ICD-10-CM | POA: Diagnosis not present

## 2017-08-17 DIAGNOSIS — Z0000 Encounter for general adult medical examination without abnormal findings: Secondary | ICD-10-CM

## 2017-08-17 DIAGNOSIS — F32A Depression, unspecified: Secondary | ICD-10-CM

## 2017-08-17 NOTE — Assessment & Plan Note (Signed)
HTN: Seems well controlled on carvedilol, Aldactone.  Last BMP satisfactory Hyperlipidemia: Last cholesterol very good, diet control Allergies: Controlled Anxiety, depression, hoarding behavior, saw psychiatry x 2 , last OV 03-2017, f/u 4 months; notes reviewed, symptoms were felt not to be severe, Rx BuSpar which apparently help. RTC 6 months, yearly checkup

## 2017-08-17 NOTE — Patient Instructions (Signed)
  GO TO THE FRONT DESK Schedule your next appointment for a yearly check up in 6 months, fasting

## 2017-08-17 NOTE — Progress Notes (Signed)
Subjective:    Patient ID: Monique [name], female    DOB: 1940/10/14, 77 y.o.   MRN: [id number]  DOS:  08/17/2017 Type of visit - description : rov Interval history: Anxiety, 2 notes from psychiatry review. HTN: Good medication compliance, no ambulatory BPs. High cholesterol  Controlled, on lifestyle modification Allergies: Well-controlled Has a lesion at the right thumb, slightly tender.  Started as a cut?  Review of Systems Denies chest pain, difficulty breathing, edema No nausea, vomiting, diarrhea No depression or suicidal ideas  Past Medical History:  Diagnosis Date  . Allergy   . Anemia    when had uterine fibroids  . Anxiety   . Cataract    very small  . Cholelithiasis   . DJD (degenerative joint disease)   . Dry eye syndrome   . Headache(784.0)   . Hyperlipidemia 01/20/2011  . Hypertension 09/29/2006  . Substance abuse ([place])   . Thrombocytopenia ([place]) 08/27/2011   hematology evaluation 08-2011, return prn  . Urticaria   . Vasomotor rhinitis     Past Surgical History:  Procedure Laterality Date  . ABDOMINAL HYSTERECTOMY     no oophorectomy  . APPENDECTOMY    . CHOLECYSTECTOMY N/A 06/06/2016   Procedure: LAPAROSCOPIC CHOLECYSTECTOMY WITH INTRAOPERATIVE CHOLANGIOGRAM;  Surgeon: Johnathan Hausen, MD;  Location: [facility];  Service: General;  Laterality: N/A;  . COLONOSCOPY    . DILATION AND CURETTAGE OF UTERUS    . mole removals    . TONSILLECTOMY    . UMBILICAL HERNIA REPAIR      Social History   Socioeconomic History  . Marital status: Widowed    Spouse name: Not on file  . Number of children: 2  . Years of education: Not on file  . Highest education level: Not on file  Social Needs  . Financial resource strain: Not on file  . Food insecurity - worry: Not on file  . Food insecurity - inability: Not on file  . Transportation needs - medical: Not on file  . Transportation needs - non-medical: Not on file  Occupational History  . 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 Smoking status: Never Smoker  . Smokeless tobacco: Never Used  Substance and Sexual Activity  . Alcohol use: No  . Drug use: No  . Sexual activity: No  Other Topics Concern  . Not on file  Social History Narrative   Her mother lives w/ her    husband had prostate ca , passed away 10/12/2013   takes care of mom, 58 y/o who is blind , and two other fam members              Allergies as of 08/17/2017      Reactions   Bee Venom Anaphylaxis   Doxycycline Hives, Itching   Penicillins Itching, Nausea And Vomiting, Swelling   syncope   Sertraline Other (See Comments)   Dry Mouth   Azithromycin Itching, Rash   Cerumenex [trolamine] Itching, Rash   Climara [estradiol] Itching, Rash   Nickel Rash      Medication List        Accurate as of 08/17/17  9:51 PM. Always use your most recent med list.          aspirin 81 MG tablet Take 81 mg by mouth daily with breakfast.   busPIRone 10 MG tablet Commonly known as:  BUSPAR 1 qam  2  qpm with meals  CALCIUM + D PO Take 2 tablets by mouth daily with lunch.   carvedilol 6.25 MG tablet Commonly known as:  COREG Take 1 tablet (6.25 mg total) by mouth 2 (two) times daily with a meal.   cycloSPORINE 0.05 % ophthalmic emulsion Commonly known as:  RESTASIS Place 1 drop into both eyes every 12 (twelve) hours.   DELSYM 30 MG/5ML liquid Generic drug:  dextromethorphan Take 15 mg by mouth as needed for cough.   diphenhydrAMINE 25 MG tablet Commonly known as:  BENADRYL Take 25 mg by mouth every 6 (six) hours as needed for allergies. Reported on 08/06/2015   EPINEPHrine 0.3 mg/0.3 mL Soaj injection Commonly known as:  EPIPEN 2-PAK Inject 0.3 mLs (0.3 mg total) into the muscle once.   famotidine 20 MG tablet Commonly known as:  PEPCID Take 1 tablet (20 mg total) by mouth 2 (two) times daily.   fluticasone 50 MCG/ACT nasal spray Commonly known as:  FLONASE Place 1-2 sprays into both  nostrils daily as needed for allergies or rhinitis.   glucosamine-chondroitin 500-400 MG tablet Take 1 tablet by mouth 3 (three) times daily.   montelukast 10 MG tablet Commonly known as:  SINGULAIR Take 1 tablet (10 mg total) by mouth at bedtime.   multivitamin with minerals Tabs tablet Take 1 tablet by mouth daily with breakfast.   spironolactone 25 MG tablet Commonly known as:  ALDACTONE Take 1 tablet (25 mg total) by mouth daily.   SYSTANE ULTRA 0.4-0.3 % Soln Generic drug:  Polyethyl Glycol-Propyl Glycol Place 1 drop into both eyes daily as needed (dry eyes).          Objective:   Physical Exam  Musculoskeletal:       Arms:  BP 132/70 (BP Location: Left Arm, Patient Position: Sitting, Cuff Size: Normal)   Pulse 74   Temp (!) 97.4 F (36.3 C) (Oral)   Resp 14   Ht 5\' 2"  (1.575 m)   Wt 172 lb 8 oz (78.2 kg)   SpO2 97%   BMI 31.55 kg/m  General:   Well developed, well nourished . NAD.  HEENT:  Normocephalic . Face symmetric, atraumatic Lungs:  CTA B Normal respiratory effort, no intercostal retractions, no accessory muscle use. Heart: RRR,  no murmur.  No pretibial edema bilaterally  Skin: Not pale. Not jaundice Neurologic:  alert & oriented X3.  Speech normal, gait appropriate for age and unassisted Psych--  Cognition and judgment appear intact.  Cooperative with normal attention span and concentration.  Behavior appropriate. No anxious or depressed appearing.      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  Last BMP  satisfactory Hyperlipidemia: Last cholesterol very good, diet control Allergies: Controlled Anxiety, depression, hoarding behavior, saw psychiatry x 2 , last OV 03-2017, f/u 4 months; notes reviewed, symptoms were felt not to be severe, Rx BuSpar which apparently help. RTC 6 months, yearly checkup

## 2017-08-17 NOTE — Patient Instructions (Signed)
Follow up with Dr.Paz as directed  Continue to eat heart healthy diet (full of fruits, vegetables, whole grains, lean protein, water--limit salt, fat, and sugar intake) and increase physical activity as tolerated.  Continue doing brain stimulating activities (puzzles, reading, adult coloring books, staying active) to keep memory sharp.   Bring a copy of your living will and/or healthcare power of attorney to your next office visit.   Monique [name] , Thank you for taking time to come for your Medicare Wellness Visit. I appreciate your ongoing commitment to your health goals. Please review the following plan we discussed and let me know if I can assist you in the future.   These are the goals we discussed: Goals    . Weight (lb) < 160 lb (72.6 kg)       This is a list of the screening recommended for you and due dates:  Health Maintenance  Topic Date Due  . DEXA scan (bone density measurement)  07/07/2005  . Mammogram  11/26/2016  . Tetanus Vaccine  04/13/2023  . Flu Shot  Completed  . Pneumonia vaccines  Completed    Health Maintenance for Postmenopausal Women Menopause is a normal process in which your reproductive ability comes to an end. This process happens gradually over a span of months to years, usually between the ages of 49 and 56. Menopause is complete when you have missed 12 consecutive menstrual periods. It is important to talk with your health care provider about some of the most common conditions that affect postmenopausal women, such as heart disease, cancer, and bone loss (osteoporosis). Adopting a healthy lifestyle and getting preventive care can help to promote your health and wellness. Those actions can also lower your chances of developing some of these common conditions. What should I know about menopause? During menopause, you may experience a number of symptoms, such as:  Moderate-to-severe hot flashes.  Night sweats.  Decrease in sex drive.  Mood  swings.  Headaches.  Tiredness.  Irritability.  Memory problems.  Insomnia.  Choosing to treat or not to treat menopausal changes is an individual decision that you make with your health care provider. What should I know about hormone replacement therapy and supplements? Hormone therapy products are effective for treating symptoms that are associated with menopause, such as hot flashes and night sweats. Hormone replacement carries certain risks, especially as you become older. If you are thinking about using estrogen or estrogen with progestin treatments, discuss the benefits and risks with your health care provider. What should I know about heart disease and stroke? Heart disease, heart attack, and stroke become more likely as you age. This may be due, in part, to the hormonal changes that your body experiences during menopause. These can affect how your body processes dietary fats, triglycerides, and cholesterol. Heart attack and stroke are both medical emergencies. There are many things that you can do to help prevent heart disease and stroke:  Have your blood pressure checked at least every 1-2 years. High blood pressure causes heart disease and increases the risk of stroke.  If you are 82-28 years old, ask your health care provider if you should take aspirin to prevent a heart attack or a stroke.  Do not use any tobacco products, including cigarettes, chewing tobacco, or electronic cigarettes. If you need help quitting, ask your health care provider.  It is important to eat a healthy diet and maintain a healthy weight. ? Be sure to include plenty of vegetables, fruits, low-fat  dairy products, and lean protein. ? Avoid eating foods that are high in solid fats, added sugars, or salt (sodium).  Get regular exercise. This is one of the most important things that you can do for your health. ? Try to exercise for at least 150 minutes each week. The type of exercise that you do should  increase your heart rate and make you sweat. This is known as moderate-intensity exercise. ? Try to do strengthening exercises at least twice each week. Do these in addition to the moderate-intensity exercise.  Know your numbers.Ask your health care provider to check your cholesterol and your blood glucose. Continue to have your blood tested as directed by your health care provider.  What should I know about cancer screening? There are several types of cancer. Take the following steps to reduce your risk and to catch any cancer development as early as possible. Breast Cancer  Practice breast self-awareness. ? This means understanding how your breasts normally appear and feel. ? It also means doing regular breast self-exams. Let your health care provider know about any changes, no matter how small.  If you are 59 or older, have a clinician do a breast exam (clinical breast exam or CBE) every year. Depending on your age, family history, and medical history, it may be recommended that you also have a yearly breast X-ray (mammogram).  If you have a family history of breast cancer, talk with your health care provider about genetic screening.  If you are at high risk for breast cancer, talk with your health care provider about having an MRI and a mammogram every year.  Breast cancer (BRCA) gene test is recommended for women who have family members with BRCA-related cancers. Results of the assessment will determine the need for genetic counseling and BRCA1 and for BRCA2 testing. BRCA-related cancers include these types: ? Breast. This occurs in males or females. ? Ovarian. ? Tubal. This may also be called fallopian tube cancer. ? Cancer of the abdominal or pelvic lining (peritoneal cancer). ? Prostate. ? Pancreatic.  Cervical, Uterine, and Ovarian Cancer Your health care provider may recommend that you be screened regularly for cancer of the pelvic organs. These include your ovaries, uterus,  and vagina. This screening involves a pelvic exam, which includes checking for microscopic changes to the surface of your cervix (Pap test).  For women ages 21-65, health care providers may recommend a pelvic exam and a Pap test every three years. For women ages 7-65, they may recommend the Pap test and pelvic exam, combined with testing for human papilloma virus (HPV), every five years. Some types of HPV increase your risk of cervical cancer. Testing for HPV may also be done on women of any age who have unclear Pap test results.  Other health care providers may not recommend any screening for nonpregnant women who are considered low risk for pelvic cancer and have no symptoms. Ask your health care provider if a screening pelvic exam is right for you.  If you have had past treatment for cervical cancer or a condition that could lead to cancer, you need Pap tests and screening for cancer for at least 20 years after your treatment. If Pap tests have been discontinued for you, your risk factors (such as having a new sexual partner) need to be reassessed to determine if you should start having screenings again. Some women have medical problems that increase the chance of getting cervical cancer. In these cases, your health care provider may recommend  that you have screening and Pap tests more often.  If you have a family history of uterine cancer or ovarian cancer, talk with your health care provider about genetic screening.  If you have vaginal bleeding after reaching menopause, tell your health care provider.  There are currently no reliable tests available to screen for ovarian cancer.  Lung Cancer Lung cancer screening is recommended for adults 31-69 years old who are at high risk for lung cancer because of a history of smoking. A yearly low-dose CT scan of the lungs is recommended if you:  Currently smoke.  Have a history of at least 30 pack-years of smoking and you currently smoke or have quit  within the past 15 years. A pack-year is smoking an average of one pack of cigarettes per day for one year.  Yearly screening should:  Continue until it has been 15 years since you quit.  Stop if you develop a health problem that would prevent you from having lung cancer treatment.  Colorectal Cancer  This type of cancer can be detected and can often be prevented.  Routine colorectal cancer screening usually begins at age 40 and continues through age 64.  If you have risk factors for colon cancer, your health care provider may recommend that you be screened at an earlier age.  If you have a family history of colorectal cancer, talk with your health care provider about genetic screening.  Your health care provider may also recommend using home test kits to check for hidden blood in your stool.  A small camera at the end of a tube can be used to examine your colon directly (sigmoidoscopy or colonoscopy). This is done to check for the earliest forms of colorectal cancer.  Direct examination of the colon should be repeated every 5-10 years until age 19. However, if early forms of precancerous polyps or small growths are found or if you have a family history or genetic risk for colorectal cancer, you may need to be screened more often.  Skin Cancer  Check your skin from head to toe regularly.  Monitor any moles. Be sure to tell your health care provider: ? About any new moles or changes in moles, especially if there is a change in a mole's shape or color. ? If you have a mole that is larger than the size of a pencil eraser.  If any of your family members has a history of skin cancer, especially at a young age, talk with your health care provider about genetic screening.  Always use sunscreen. Apply sunscreen liberally and repeatedly throughout the day.  Whenever you are outside, protect yourself by wearing long sleeves, pants, a wide-brimmed hat, and sunglasses.  What should I know  about osteoporosis? Osteoporosis is a condition in which bone destruction happens more quickly than new bone creation. After menopause, you may be at an increased risk for osteoporosis. To help prevent osteoporosis or the bone fractures that can happen because of osteoporosis, the following is recommended:  If you are 8-82 years old, get at least 1,000 mg of calcium and at least 600 mg of vitamin D per day.  If you are older than age 29 but younger than age 72, get at least 1,200 mg of calcium and at least 600 mg of vitamin D per day.  If you are older than age 80, get at least 1,200 mg of calcium and at least 800 mg of vitamin D per day.  Smoking and excessive alcohol intake  increase the risk of osteoporosis. Eat foods that are rich in calcium and vitamin D, and do weight-bearing exercises several times each week as directed by your health care provider. What should I know about how menopause affects my mental health? Depression may occur at any age, but it is more common as you become older. Common symptoms of depression include:  Low or sad mood.  Changes in sleep patterns.  Changes in appetite or eating patterns.  Feeling an overall lack of motivation or enjoyment of activities that you previously enjoyed.  Frequent crying spells.  Talk with your health care provider if you think that you are experiencing depression. What should I know about immunizations? It is important that you get and maintain your immunizations. These include:  Tetanus, diphtheria, and pertussis (Tdap) booster vaccine.  Influenza every year before the flu season begins.  Pneumonia vaccine.  Shingles vaccine.  Your health care provider may also recommend other immunizations. This information is not intended to replace advice given to you by your health care provider. Make sure you discuss any questions you have with your health care provider. Document Released: 08/08/2005 Document Revised: 01/04/2016  Document Reviewed: 03/20/2015 Elsevier Interactive Patient Education  2018 [employer].

## 2017-08-17 NOTE — Progress Notes (Signed)
Pre visit review using our clinic review tool, if applicable. No additional management support is needed unless otherwise documented below in the visit note. 

## 2017-08-19 ENCOUNTER — Encounter (HOSPITAL_COMMUNITY): Payer: Self-pay | Admitting: Psychiatry

## 2017-08-19 ENCOUNTER — Ambulatory Visit (INDEPENDENT_AMBULATORY_CARE_PROVIDER_SITE_OTHER): Payer: PPO | Admitting: Psychiatry

## 2017-08-19 VITALS — BP 142/83 | HR 83 | Ht 60.5 in | Wt 173.4 lb

## 2017-08-19 DIAGNOSIS — F4323 Adjustment disorder with mixed anxiety and depressed mood: Secondary | ICD-10-CM

## 2017-08-19 DIAGNOSIS — Z09 Encounter for follow-up examination after completed treatment for conditions other than malignant neoplasm: Secondary | ICD-10-CM | POA: Diagnosis not present

## 2017-08-19 NOTE — Progress Notes (Signed)
Over:  Psychiatric Initial Adult Assessment   Patient Identification: LYNDSEY DEMOS MRN:  [id number] Date of Evaluation:  08/19/2017 Referral Source: Dr. Si Raider and Dr. Larose Kells Chief Complaint:   Visit Diagnosis: No diagnosis found.  History of Present  Today the patient is seen with her son.Wilton.the patient is actually doing very well. Due to financial reasons she stopped her BuSpar but she doesn't visit. She's not anxious. She's not feeling depressed. She sleeping and eating well.She's got good energy.She is very active. She sings in the choir and she is a Geophysicist/field seismologist. She enjoys the sings a great deal. Her biggest stresses dealing with her elderly mother is 52. The patient medically very stable.She is well-controlled hypertension. Financially she doing only fairly well. She does exercise a little by walking.The patient denies the use of alcohol or drugs.It is noted when I that her she was referred because she had mild,. Abdomen neuropsychologist wanted her to be evaluated psychiatrically. At that time today I find no clear evidence of a major mental illness.The patient still drives his had no problems driving. She does do all her basic and institutional ADLs. She does much of her bills does it on the computer. Patient does her cooking. She takes care of her clothing. She cares for her elderly mother and for her elderly uncle as well. Patient is not suicidal. She is functioning very well. (Hypo) Manic Symptoms:   Anxiety Symptoms:   Psychotic Symptoms:   PTSD Symptoms:   Past Psychiatric History: Past use of Lexapro and Zoloft which cause side effects. Previous Psychotropic Medications: Yes   Substance Abuse History in the last 12 months: No  Consequences of Substance Abuse: Negative  Past Medical History:  Past Medical History:  Diagnosis Date  . Allergy   . Anemia    when had uterine fibroids  . Anxiety   . Cataract    very small  . Cholelithiasis   . DJD (degenerative joint  disease)   . Dry eye syndrome   . Headache(784.0)   . Hyperlipidemia 01/20/2011  . Hypertension 09/29/2006  . Substance abuse ([place])   . Thrombocytopenia ([place]) 08/27/2011   hematology evaluation 08-2011, return prn  . Urticaria   . Vasomotor rhinitis     Past Surgical History:  Procedure Laterality Date  . ABDOMINAL HYSTERECTOMY     no oophorectomy  . APPENDECTOMY    . CHOLECYSTECTOMY N/A 06/06/2016   Procedure: LAPAROSCOPIC CHOLECYSTECTOMY WITH INTRAOPERATIVE CHOLANGIOGRAM;  Surgeon: Johnathan Hausen, MD;  Location: [facility];  Service: General;  Laterality: N/A;  . COLONOSCOPY    . DILATION AND CURETTAGE OF UTERUS    . mole removals    . TONSILLECTOMY    . UMBILICAL HERNIA REPAIR      Family Psychiatric History:   Family History:  Family History  Problem Relation Age of Onset  . Coronary artery disease Father   . Cancer Father        unsure of type  . Other Father        Guillain- barre syndrome  . Diabetes Other        uncles   . Hypertension Mother   . Anxiety disorder Mother   . Depression Mother   . Prostate cancer Other        grandfather  . Breast cancer Sister 51  . Depression Maternal Uncle   . Colon cancer Neg Hx   . Colon polyps Neg Hx   . Rectal cancer Neg Hx   . Stomach  cancer Neg Hx     Social History:   Social History   Socioeconomic History  . Marital status: Widowed    Spouse name: None  . Number of children: 2  . Years of education: None  . Highest education level: None  Social Needs  . Financial resource strain: None  . Food insecurity - worry: None  . Food insecurity - inability: None  . Transportation needs - medical: None  . Transportation needs - non-medical: None  Occupational History  . 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 Smoking status: Never Smoker  . Smokeless tobacco: Never Used  Substance and Sexual Activity  . Alcohol use: No  . Drug use: No  . Sexual activity: No  Other Topics Concern  .  None  Social History Narrative   Her mother lives w/ her    husband had prostate ca , passed away 10-25-2013   takes care of mom, 34 y/o who is blind , and two other fam members            Additional Social History:   Allergies:   Allergies  Allergen Reactions  . Bee Venom Anaphylaxis  . Doxycycline Hives and Itching  . Penicillins Itching, Nausea And Vomiting and Swelling    syncope  . Sertraline Other (See Comments)    Dry Mouth  . Azithromycin Itching and Rash  . Cerumenex [Trolamine] Itching and Rash  . Climara [Estradiol] Itching and Rash  . Nickel Rash    Metabolic Disorder Labs: Lab Results  Component Value Date   HGBA1C 6.0 02/05/2017   No results found for: PROLACTIN Lab Results  Component Value Date   CHOL 158 02/05/2017   TRIG 103.0 02/05/2017   HDL 51.50 02/05/2017   CHOLHDL 3 02/05/2017   VLDL 20.6 02/05/2017   LDLCALC 86 02/05/2017   LDLCALC 90 08/06/2015     Current Medications: Current Outpatient Medications  Medication Sig Dispense Refill  . aspirin 81 MG tablet Take 81 mg by mouth daily with breakfast.     . busPIRone (BUSPAR) 10 MG tablet 1 qam  2  qpm with meals 90 tablet 5  . Calcium-Vitamin D (CALCIUM + D PO) Take 2 tablets by mouth daily with lunch.     . carvedilol (COREG) 6.25 MG tablet Take 1 tablet (6.25 mg total) by mouth 2 (two) times daily with a meal. 180 tablet 1  . cycloSPORINE (RESTASIS) 0.05 % ophthalmic emulsion Place 1 drop into both eyes every 12 (twelve) hours.     Marland Kitchen dextromethorphan (DELSYM) 30 MG/5ML liquid Take 15 mg by mouth as needed for cough.    . diphenhydrAMINE (BENADRYL) 25 MG tablet Take 25 mg by mouth every 6 (six) hours as needed for allergies. Reported on 08/06/2015    . EPINEPHrine (EPIPEN 2-PAK) 0.3 mg/0.3 mL IJ SOAJ injection Inject 0.3 mLs (0.3 mg total) into the muscle once. 1 Device 1  . famotidine (PEPCID) 20 MG tablet Take 1 tablet (20 mg total) by mouth 2 (two) times daily. (Patient taking differently: Take  20 mg by mouth daily. ) 20 tablet 0  . fluticasone (FLONASE) 50 MCG/ACT nasal spray Place 1-2 sprays into both nostrils daily as needed for allergies or rhinitis.    Marland Kitchen glucosamine-chondroitin 500-400 MG tablet Take 1 tablet by mouth 3 (three) times daily.      . montelukast (SINGULAIR) 10 MG tablet Take 1 tablet (10 mg total) by mouth at  bedtime.    . Multiple Vitamin (MULTIVITAMIN WITH MINERALS) TABS Take 1 tablet by mouth daily with breakfast.    . Polyethyl Glycol-Propyl Glycol (SYSTANE ULTRA) 0.4-0.3 % SOLN Place 1 drop into both eyes daily as needed (dry eyes).     Marland Kitchen spironolactone (ALDACTONE) 25 MG tablet Take 1 tablet (25 mg total) by mouth daily. 90 tablet 2   No current facility-administered medications for this visit.     Neurologic: Headache: No Seizure: No Paresthesias:No  Musculoskeletal: Strength & Muscle Tone: within normal limits Gait & Station: normal Patient leans right  Psychiatric Specialty Exam: ROS  Blood pressure (!) 142/83, pulse 83, height 5' 0.5" (1.537 m), weight 173 lb 6.4 oz (78.7 kg).Body mass index is 33.31 kg/m.  General Appearance: Casual  Eye Contact:  Good  Speech:  Clear and Coherent  Volume:   Mood:  Negative  Affect:  Congruent  Thought Process:  Goal Directed  Orientation:  NA  Thought Content:  Logical  Suicidal Thoughts:  No  Homicidal Thoughts:  No  Memory:  Negative  Judgement:  Fair  Insight:  Good  Psychomotor Activity:  Normal  Concentration:    Recall:  Good  Fund of Knowledge:Good  Language: Fair  Akathisia:  No  Handed:  Right  AIMS (if indicated):    Assets:  Communication Skills  ADL's:  Intact  Cognition: WNL  Sleep:      Treatment Plan Summary: At this time the patient is not on any psychotropic medications. She doing very well. She is functioning well. Her son generally agrees. I agreed to see this patient 1 more time in about one year we'll reassess the made her functioning level. Possibility that her mother  dies over the next year is probably pretty high. I made myself available to her son and the patient already problems at this time she doesn't need to be seen for another year. Again she developed that time I'll consider any my schedule appointments with  Jerral Ralph, MD 2/20/20194:02 PM

## 2017-09-08 DIAGNOSIS — L301 Dyshidrosis [pompholyx]: Secondary | ICD-10-CM | POA: Diagnosis not present

## 2017-09-21 DIAGNOSIS — L509 Urticaria, unspecified: Secondary | ICD-10-CM | POA: Diagnosis not present

## 2017-09-21 DIAGNOSIS — J300 Vasomotor rhinitis: Secondary | ICD-10-CM | POA: Diagnosis not present

## 2017-09-24 ENCOUNTER — Other Ambulatory Visit: Payer: Self-pay | Admitting: Internal Medicine

## 2017-09-30 MED FILL — RESTASIS MULTIDOSE 0.05% EY: 0.05 | 90 days supply | Qty: 6 | Fill #0

## 2017-12-03 MED FILL — RESTASIS MULTIDOSE 0.05% EY: 0.05 | 28 days supply | Qty: 6 | Fill #1

## 2017-12-17 ENCOUNTER — Encounter (HOSPITAL_COMMUNITY): Payer: Self-pay | Admitting: Family Medicine

## 2017-12-17 ENCOUNTER — Ambulatory Visit (HOSPITAL_COMMUNITY)
Admission: EM | Admit: 2017-12-17 | Discharge: 2017-12-17 | Disposition: A | Discharge status: Home / Self Care | Payer: PPO | Attending: Family Medicine | Admitting: Family Medicine

## 2017-12-17 DIAGNOSIS — M545 Low back pain: Secondary | ICD-10-CM | POA: Diagnosis not present

## 2017-12-17 DIAGNOSIS — J302 Other seasonal allergic rhinitis: Secondary | ICD-10-CM | POA: Diagnosis not present

## 2017-12-17 DIAGNOSIS — H6123 Impacted cerumen, bilateral: Secondary | ICD-10-CM

## 2017-12-17 DIAGNOSIS — H6121 Impacted cerumen, right ear: Secondary | ICD-10-CM

## 2017-12-17 LAB — POCT URINALYSIS DIP (DEVICE)
Bilirubin Urine: NEGATIVE
Glucose, UA: NEGATIVE mg/dL
Ketones, ur: NEGATIVE mg/dL
Leukocytes, UA: NEGATIVE
Nitrite: NEGATIVE
Protein, ur: NEGATIVE mg/dL
Specific Gravity, Urine: >=1.03 (ref 1.005–1.030)
Urobilinogen, UA: 0.2 mg/dL (ref 0.0–1.0)
pH: 5 (ref 5.0–8.0)

## 2017-12-17 MED ORDER — ONDANSETRON 4 MG PO TBDP: 4.0000 mg | ORAL_TABLET | Freq: Three times a day (TID) | ORAL | 0 refills | Status: DC | PRN

## 2017-12-17 MED ORDER — MONTELUKAST SODIUM 10 MG PO TABS: 10.0000 mg | ORAL_TABLET | Freq: Every day | ORAL | 0 refills | Status: DC

## 2017-12-17 NOTE — Discharge Instructions (Signed)
Please continue astazline eyedrops for your itchy eyes. Continue Flonase and Zyrtec, please restart Singulair; you may try Afrin for 2 to 3 days to help with nasal congestion and ear pain.  Do not use more than 3 days as this may cause your congestion to worsen.  Please continue take Aleve for your back pain.

## 2017-12-17 NOTE — ED Triage Notes (Signed)
Pt here for urinary frequency, lower back pain. She has also been having nasal congestion and itchy, watery eyes x 1 week. Her allergist called in some eye drops earlier this week.

## 2017-12-17 NOTE — ED Provider Notes (Signed)
[place]    CSN: [id number] Arrival date & time: 12/17/17  1422     History   Chief Complaint Chief Complaint  Patient presents with  . Nasal Congestion  . Back Pain    HPI Monique [name] is a 77 y.o. female presenting today for evaluation of URI symptoms as well as lower back pain.  Patient has had URI symptoms occluding nasal congestion, sore throat off and on, watery itchy eyes for approximately 1 week.  She has a history of allergic rhinitis, sees allergist for this.  She has been taking Flonase, Zyrtec as well as recently initiated on eyedrops 2 days ago.  She feels like the eyedrops have been helping her.  Her main complaint is having difficulty hearing out of her ears and feeling pressure in her ears.  Denies fevers.  She has been pushing fluids.  She also has had a lower back pain.  Denies any injury.  Has been taking Aleve which has eased the pain some.  Also noting increased urinary frequency.  Denies dysuria.  Denies saddle anesthesia or loss of bowel or bladder control.  HPI  Past Medical History:  Diagnosis Date  . Allergy   . Anemia    when had uterine fibroids  . Anxiety   . Cataract    very small  . Cholelithiasis   . DJD (degenerative joint disease)   . Dry eye syndrome   . Headache(784.0)   . Hyperlipidemia 01/20/2011  . Hypertension 09/29/2006  . Substance abuse ([place])   . Thrombocytopenia ([place]) 08/27/2011   hematology evaluation 08-2011, return prn  . Urticaria   . Vasomotor rhinitis     Patient Active Problem List   Diagnosis Date Noted  . MCI (mild cognitive impairment) 02/06/2017  . 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  . GERD (gastroesophageal reflux disease) 06/05/2016  . PCP NOTES >>>>>>>>>>>>>>>>>>>>>>>>> 02/04/2016  . Severe allergic reaction 08/17/2014  . Allergic rhinitis 12/01/2011  . Thrombocytopenia ([place]) 08/27/2011  . Hyperlipidemia 01/20/2011  . Annual physical exam 09/18/2010  . Hyperglycemia 09/17/2009  . Backache  07/10/2008  . Anxiety and depression 01/19/2008  . HX OF GALLSTONE 10/20/2006  . Essential hypertension 09/29/2006    Past Surgical History:  Procedure Laterality Date  . ABDOMINAL HYSTERECTOMY     no oophorectomy  . APPENDECTOMY    . CHOLECYSTECTOMY N/A 06/06/2016   Procedure: LAPAROSCOPIC CHOLECYSTECTOMY WITH INTRAOPERATIVE CHOLANGIOGRAM;  Surgeon: Johnathan Hausen, MD;  Location: [facility];  Service: General;  Laterality: N/A;  . COLONOSCOPY    . DILATION AND CURETTAGE OF UTERUS    . mole removals    . TONSILLECTOMY    . UMBILICAL HERNIA REPAIR      OB History   None      Home Medications    Prior to Admission medications   Medication Sig Start Date End Date Taking? Authorizing Provider  aspirin 81 MG tablet Take 81 mg by mouth daily with breakfast.     [provider]  busPIRone (BUSPAR) 10 MG tablet 1 qam  2  qpm with meals 04/22/17   Plovsky, Berneta Sages, MD  Calcium-Vitamin D (CALCIUM + D PO) Take 2 tablets by mouth daily with lunch.     [provider]  carvedilol (COREG) 6.25 MG tablet Take 1 tablet (6.25 mg total) by mouth 2 (two) times daily with a meal. 09/25/17   Colon Branch, MD  cycloSPORINE (RESTASIS) 0.05 % ophthalmic emulsion Place 1 drop into both eyes every 12 (twelve) hours.  [provider]  dextromethorphan (DELSYM) 30 MG/5ML liquid Take 15 mg by mouth as needed for cough.    [provider]  diphenhydrAMINE (BENADRYL) 25 MG tablet Take 25 mg by mouth every 6 (six) hours as needed for allergies. Reported on 08/06/2015    [provider]  EPINEPHrine (EPIPEN 2-PAK) 0.3 mg/0.3 mL IJ SOAJ injection Inject 0.3 mLs (0.3 mg total) into the muscle once. 11/27/15   Colon Branch, MD  famotidine (PEPCID) 20 MG tablet Take 1 tablet (20 mg total) by mouth 2 (two) times daily. Patient taking differently: Take 20 mg by mouth daily.  06/19/14   Rolland Porter, MD  fluticasone (FLONASE) 50 MCG/ACT nasal spray Place 1-2 sprays into both  nostrils daily as needed for allergies or rhinitis.    [provider]  glucosamine-chondroitin 500-400 MG tablet Take 1 tablet by mouth 3 (three) times daily.      [provider]  montelukast (SINGULAIR) 10 MG tablet Take 1 tablet (10 mg total) by mouth at bedtime. 12/17/17   Saniyya Gau C, PA-C  Multiple Vitamin (MULTIVITAMIN WITH MINERALS) TABS Take 1 tablet by mouth daily with breakfast.    [provider]  ondansetron (ZOFRAN ODT) 4 MG disintegrating tablet Take 1 tablet (4 mg total) by mouth every 8 (eight) hours as needed for nausea or vomiting. 12/17/17   Robyn Galati C, PA-C  Polyethyl Glycol-Propyl Glycol (SYSTANE ULTRA) 0.4-0.3 % SOLN Place 1 drop into both eyes daily as needed (dry eyes).     [provider]  spironolactone (ALDACTONE) 25 MG tablet Take 1 tablet (25 mg total) by mouth daily. 09/25/17   Colon Branch, MD    Family History Family History  Problem Relation Age of Onset  . Coronary artery disease Father   . Cancer Father        unsure of type  . Other Father        Guillain- barre syndrome  . Diabetes Other        uncles   . Hypertension Mother   . Anxiety disorder Mother   . Depression Mother   . Prostate cancer Other        grandfather  . Breast cancer Sister 9  . Depression Maternal Uncle   . Colon cancer Neg Hx   . Colon polyps Neg Hx   . Rectal cancer Neg Hx   . Stomach cancer Neg Hx     Social History Social History   Tobacco Use  . Smoking status: Never Smoker  . Smokeless tobacco: Never Used  Substance Use Topics  . Alcohol use: No  . Drug use: No     Allergies   Bee venom; Doxycycline; Penicillins; Sertraline; Azithromycin; Cerumenex [trolamine]; Climara [estradiol]; and Nickel   Review of Systems Review of Systems  Constitutional: Negative for chills, fatigue and fever.  HENT: Positive for congestion, ear pain, rhinorrhea and sore throat. Negative for sinus pressure and trouble swallowing.     Respiratory: Positive for cough. Negative for chest tightness and shortness of breath.   Cardiovascular: Negative for chest pain.  Gastrointestinal: Negative for abdominal pain, nausea and vomiting.  Musculoskeletal: Positive for back pain and myalgias.  Skin: Negative for rash.  Neurological: Negative for dizziness, light-headedness and headaches.     Physical Exam Triage Vital Signs ED Triage Vitals  Enc Vitals Group     BP 12/17/17 1434 (!) 141/85     Pulse Rate 12/17/17 1434 81     Resp  12/17/17 1434 18     Temp 12/17/17 1434 98.5 F (36.9 C)     Temp src --      SpO2 12/17/17 1434 97 %     Weight --      Height --      Head Circumference --      Peak Flow --      Pain Score 12/17/17 1433 4     Pain Loc --      Pain Edu? --      Excl. in [place]? --    No data found.  Updated Vital Signs BP (!) 141/85   Pulse 81   Temp 98.5 F (36.9 C)   Resp 18   SpO2 97%   Visual Acuity Right Eye Distance:   Left Eye Distance:   Bilateral Distance:    Right Eye Near:   Left Eye Near:    Bilateral Near:     Physical Exam  Constitutional: She appears well-developed and well-nourished. No distress.  HENT:  Head: Normocephalic and atraumatic.  Left TM with moderate amount of wax in EAC, TM partially visualized, without erythema.  Wax removed, entire TM visualized.  Right TM not visualized initially due to cerumen impaction, after lavage TM visualized, nonerythematous.  External EAC did appear erythematous without swelling after earwax lavage.  Oral mucosa pink and moist, no tonsillar enlargement or exudate. Posterior pharynx patent and nonerythematous, no uvula deviation or swelling. Normal phonation.   Eyes: Pupils are equal, round, and reactive to light. Conjunctivae and EOM are normal.  Wearing glasses Conjunctive and nonerythematous, no drainage/discharge seen  Neck: Neck supple.  Cardiovascular: Normal rate and regular rhythm.  No murmur heard. Pulmonary/Chest:  Effort normal and breath sounds normal. No respiratory distress.  Breathing underwent rest, CTA BL  Abdominal: Soft. There is no tenderness.  Musculoskeletal: She exhibits no edema.  Mild tenderness to palpation of lower lumbar musculature bilaterally, no tenderness midline.  Ambulating without abnormality  Neurological: She is alert.  Skin: Skin is warm and dry.  Psychiatric: She has a normal mood and affect.  Nursing note and vitals reviewed.    UC Treatments / Results  Labs (all labs ordered are listed, but only abnormal results are displayed) Labs Reviewed  POCT URINALYSIS DIP (DEVICE) - Abnormal; Notable for the following components:      Result Value   Hgb urine dipstick TRACE (*)    All other components within normal limits    EKG None  Radiology No results found.  Procedures Ear Cerumen Removal Date/Time: 12/17/2017 4:09 PM Performed by: Tarina Volk, Elesa Hacker, PA-C Authorized by: Raylene Everts, MD   Consent:    Consent obtained:  Verbal   Consent given by:  Patient   Risks discussed:  Pain and infection   Alternatives discussed:  No treatment Procedure details:    Location:  L ear and R ear   Procedure type: curette   Post-procedure details:    Inspection:  TM intact   Hearing quality:  Improved   Patient tolerance of procedure:  Tolerated well, no immediate complications Comments:     Cerumen removed fully from left EAC, cerumen partially removed from right, ear lavage required after manual attempt at removal.  Right EAC did appear slightly erythematous after lavage performed by nursing staff.   (including critical care time)  Medications Ordered in UC Medications - No data to display  Initial Impression / Assessment and Plan / UC Course  I have reviewed the triage  vital signs and the nursing notes.  Pertinent labs & imaging results that were available during my care of the patient were reviewed by me and considered in my medical decision making  (see chart for details).     Patient likely with allergic rhinitis, previously been on Singulair but has not been taking.  Will restart patient on this.  Continue Flonase.  Patient felt significantly improved after earwax removal and lavage.  Advised patient to monitor for signs of otitis externa given recent implementation.  Also discussed possibly using Afrin for nasal congestion.  Continue recently prescribed eyedrops from allergist as she was having improvement with this.Discussed strict return precautions. Patient verbalized understanding and is agreeable with plan.   UA unremarkable-increased frequency likely from increased water intake.  Will have patient continue Aleve for back pain.  No red flags.Discussed strict return precautions. Patient verbalized understanding and is agreeable with plan.  Final Clinical Impressions(s) / UC Diagnoses   Final diagnoses:  Seasonal allergic rhinitis, unspecified trigger     Discharge Instructions     Please continue astazline eyedrops for your itchy eyes. Continue Flonase and Zyrtec, please restart Singulair; you may try Afrin for 2 to 3 days to help with nasal congestion and ear pain.  Do not use more than 3 days as this may cause your congestion to worsen.  Please continue take Aleve for your back pain.    ED Prescriptions    Medication Sig Dispense Auth. Provider   montelukast (SINGULAIR) 10 MG tablet Take 1 tablet (10 mg total) by mouth at bedtime. 30 tablet Dilan Fullenwider C, PA-C   ondansetron (ZOFRAN ODT) 4 MG disintegrating tablet Take 1 tablet (4 mg total) by mouth every 8 (eight) hours as needed for nausea or vomiting. 20 tablet Keisa Blow, [place] C, PA-C     Controlled Substance Prescriptions [place] Controlled Substance Registry consulted? Not Applicable   Janith Lima, [place] 12/17/17 1611

## 2018-01-11 MED FILL — RESTASIS 0.05% EYE EMULSION: 0.05 | 90 days supply | Qty: 180 | Fill #0

## 2018-02-09 ENCOUNTER — Encounter: Payer: Self-pay | Admitting: Internal Medicine

## 2018-02-10 ENCOUNTER — Other Ambulatory Visit: Payer: Self-pay | Admitting: *Deleted

## 2018-02-10 NOTE — Patient Outreach (Signed)
[facility]) Care Management  02/10/2018  SHAI RASMUSSEN 1940-12-26 [id number]  Successful initial telephone outreach to member to attempt to complete [employer] health risk assessment screening, however member requested call back tomorrow morning on 02/11/18. Will schedule second outreach on 02/11/18.  Barrington Ellison RN,CCM,CDE [place] Management Coordinator Office Phone [phone] Office Fax [phone]

## 2018-02-11 ENCOUNTER — Other Ambulatory Visit: Payer: Self-pay | Admitting: *Deleted

## 2018-02-11 NOTE — Patient Outreach (Signed)
[place]) Care Management  02/11/2018  Monique [name] 1940-07-04 [id number]   Per member request of yesterday, second telephone outreach to complete [employer] health risk assessment screening. Left message on member's mobile number requesting return call.  Barrington Ellison RN,CCM,CDE [place] Management Coordinator Office Phone [phone] Office Fax [phone]

## 2018-02-15 ENCOUNTER — Other Ambulatory Visit: Payer: Self-pay | Admitting: *Deleted

## 2018-02-15 NOTE — Patient Outreach (Signed)
[facility]) Care Management  02/15/2018  Monique [name] 1941/05/04 [id number]   Reached member via cell phone number to attempt to complete [employer] health risk assessment screening, however member requested call back tomorrow 02/16/18 as she has company. Will schedule third and final outreach for 02/16/18.  Barrington Ellison RN,CCM,CDE [place] Management Coordinator Office Phone [phone] Office Fax [phone]

## 2018-02-16 ENCOUNTER — Other Ambulatory Visit: Payer: Self-pay | Admitting: *Deleted

## 2018-02-16 NOTE — Patient Outreach (Signed)
[facility]) Care Management  02/16/2018  Monique [name] April 10, 1941 [id number]   Fourth telephone outreach successful and able to complete [employer] health risk assessment screening. Monique [name] states her only health issue is high blood pressure and she states it is in good control. She says she has struggled at times in the past with paying for her medications but currently her medication copays are not an issue. Educated member on Psychologist, forensic.  Member declined health coach for HTN disease management. Member assessed with no further intervention needed. Verified home address; will send successful outreach letter with [place] Management program brochure and 24 hr nurse line magnet. Advised member to call this RNCM if she should have trouble paying for her medications in the future.Member voiced understanding and compliance.  Monique Ellison RN,CCM,CDE [place] Management Coordinator Office Phone [phone] Office Fax [phone]

## 2018-02-25 ENCOUNTER — Encounter: Payer: Self-pay | Admitting: Internal Medicine

## 2018-02-25 DIAGNOSIS — H524 Presbyopia: Secondary | ICD-10-CM | POA: Diagnosis not present

## 2018-02-25 DIAGNOSIS — I10 Essential (primary) hypertension: Secondary | ICD-10-CM | POA: Diagnosis not present

## 2018-02-25 DIAGNOSIS — H04123 Dry eye syndrome of bilateral lacrimal glands: Secondary | ICD-10-CM | POA: Diagnosis not present

## 2018-02-25 DIAGNOSIS — E119 Type 2 diabetes mellitus without complications: Secondary | ICD-10-CM | POA: Diagnosis not present

## 2018-02-25 DIAGNOSIS — H25099 Other age-related incipient cataract, unspecified eye: Secondary | ICD-10-CM | POA: Diagnosis not present

## 2018-02-25 DIAGNOSIS — H5203 Hypermetropia, bilateral: Secondary | ICD-10-CM | POA: Diagnosis not present

## 2018-02-25 DIAGNOSIS — H52223 Regular astigmatism, bilateral: Secondary | ICD-10-CM | POA: Diagnosis not present

## 2018-03-02 ENCOUNTER — Encounter: Payer: Self-pay | Admitting: Internal Medicine

## 2018-03-09 ENCOUNTER — Ambulatory Visit (INDEPENDENT_AMBULATORY_CARE_PROVIDER_SITE_OTHER): Payer: PPO | Admitting: Internal Medicine

## 2018-03-09 ENCOUNTER — Encounter: Payer: Self-pay | Admitting: Internal Medicine

## 2018-03-09 VITALS — BP 126/84 | HR 84 | Temp 98.1°F | Resp 14 | Ht 61.0 in | Wt 172.2 lb

## 2018-03-09 DIAGNOSIS — Z1231 Encounter for screening mammogram for malignant neoplasm of breast: Secondary | ICD-10-CM

## 2018-03-09 DIAGNOSIS — Z0000 Encounter for general adult medical examination without abnormal findings: Secondary | ICD-10-CM | POA: Diagnosis not present

## 2018-03-09 DIAGNOSIS — Z803 Family history of malignant neoplasm of breast: Secondary | ICD-10-CM

## 2018-03-09 DIAGNOSIS — R739 Hyperglycemia, unspecified: Secondary | ICD-10-CM | POA: Diagnosis not present

## 2018-03-09 DIAGNOSIS — Z780 Asymptomatic menopausal state: Secondary | ICD-10-CM

## 2018-03-09 LAB — COMPREHENSIVE METABOLIC PANEL
ALT: 19 U/L (ref 0–35)
AST: 20 U/L (ref 0–37)
Albumin: 4.2 g/dL (ref 3.5–5.2)
Alkaline Phosphatase: 73 U/L (ref 39–117)
BUN: 18 mg/dL (ref 6–23)
CO2: 32 mEq/L (ref 19–32)
Calcium: 9.9 mg/dL (ref 8.4–10.5)
Chloride: 105 mEq/L (ref 96–112)
Creatinine, Ser: 0.9 mg/dL (ref 0.40–1.20)
GFR: 77.94 mL/min (ref >60.00)
Glucose, Bld: 91 mg/dL (ref 70–99)
Potassium: 5 mEq/L (ref 3.5–5.1)
Sodium: 142 mEq/L (ref 135–145)
Total Bilirubin: 0.4 mg/dL (ref 0.2–1.2)
Total Protein: 6.6 g/dL (ref 6.0–8.3)

## 2018-03-09 LAB — LIPID PANEL
Cholesterol: 161 mg/dL (ref 0–200)
HDL: 51.1 mg/dL (ref >39.00)
LDL Cholesterol: 84 mg/dL (ref 0–99)
NonHDL: 110.15
Total CHOL/HDL Ratio: 3
Triglycerides: 131 mg/dL (ref 0.0–149.0)
VLDL: 26.2 mg/dL (ref 0.0–40.0)

## 2018-03-09 LAB — CBC WITH DIFFERENTIAL/PLATELET
Basophils Absolute: 0.1 10*3/uL (ref 0.0–0.1)
Basophils Relative: 0.9 % (ref 0.0–3.0)
Eosinophils Absolute: 0.4 10*3/uL (ref 0.0–0.7)
Eosinophils Relative: 6 % — ABNORMAL HIGH (ref 0.0–5.0)
HCT: 42.2 % (ref 36.0–46.0)
Hemoglobin: 13.4 g/dL (ref 12.0–15.0)
Lymphocytes Relative: 30.3 % (ref 12.0–46.0)
Lymphs Abs: 2.1 10*3/uL (ref 0.7–4.0)
MCHC: 31.8 g/dL (ref 30.0–36.0)
MCV: 72.3 fl — ABNORMAL LOW (ref 78.0–100.0)
Monocytes Absolute: 0.6 10*3/uL (ref 0.1–1.0)
Monocytes Relative: 8.5 % (ref 3.0–12.0)
Neutro Abs: 3.7 10*3/uL (ref 1.4–7.7)
Neutrophils Relative %: 54.3 % (ref 43.0–77.0)
Platelets: 133 10*3/uL — ABNORMAL LOW (ref 150.0–400.0)
RBC: 5.84 Mil/uL — ABNORMAL HIGH (ref 3.87–5.11)
RDW: 14.6 % (ref 11.5–15.5)
WBC: 6.8 10*3/uL (ref 4.0–10.5)

## 2018-03-09 LAB — HEMOGLOBIN A1C: Hgb A1c MFr Bld: 6 % (ref 4.6–6.5)

## 2018-03-09 MED ORDER — MONTELUKAST SODIUM 10 MG PO TABS: 10.0000 mg | ORAL_TABLET | Freq: Every day | ORAL | 3 refills | Status: DC

## 2018-03-09 MED ORDER — ZOSTER VAC RECOMB ADJUVANTED 50 MCG/0.5ML IM SUSR: 0.5000 mL | Freq: Once | INTRAMUSCULAR | 1 refills | Status: AC

## 2018-03-09 NOTE — Patient Instructions (Addendum)
GO TO THE LAB : Get the blood work     GO TO THE FRONT DESK Schedule your next appointment for a checkup in 6 months  For allergies continue with montelukast, Pepcid, carry an EpiPen.  Will  schedule a mammogram and a bone density test

## 2018-03-09 NOTE — Progress Notes (Signed)
Subjective:    Patient ID: Monique [name], female    DOB: 02-08-1941, 77 y.o.   MRN: [id number]  DOS:  03/09/2018 Type of visit - description : cpx Interval history:  Here for CPX, she has few concerns.  Review of Systems Dry eye continue to be a issue, addressed by ophthalmology. Hives and allergies are currently well controlled, wonders if she needs to continue taking Pepcid. Emotionally doing okay, saw psychiatry recently.  Other than above, a 14 point review of systems is negative   Past Medical History:  Diagnosis Date  . Allergy   . Anemia    when had uterine fibroids  . Anxiety   . Cataract    very small  . Cholelithiasis   . DJD (degenerative joint disease)   . Dry eye syndrome   . Headache(784.0)   . Hyperlipidemia 01/20/2011  . Hypertension 09/29/2006  . Substance abuse ([place])   . Thrombocytopenia ([place]) 08/27/2011   hematology evaluation 08-2011, return prn  . Urticaria   . Vasomotor rhinitis     Past Surgical History:  Procedure Laterality Date  . ABDOMINAL HYSTERECTOMY     no oophorectomy  . APPENDECTOMY    . CHOLECYSTECTOMY N/A 06/06/2016   Procedure: LAPAROSCOPIC CHOLECYSTECTOMY WITH INTRAOPERATIVE CHOLANGIOGRAM;  Surgeon: Johnathan Hausen, MD;  Location: [facility];  Service: General;  Laterality: N/A;  . COLONOSCOPY    . DILATION AND CURETTAGE OF UTERUS    . mole removals    . TONSILLECTOMY    . UMBILICAL HERNIA REPAIR      Social History   Socioeconomic History  . Marital status: Widowed    Spouse name: Not on file  . Number of children: 2  . Years of education: Not on file  . Highest education level: Not on file  Occupational History  . 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 Financial resource strain: Not on file  . Food insecurity:    Worry: Not on file    Inability: Not on file  . Transportation needs:    Medical: Not on file    Non-medical: Not on file  Tobacco Use  . Smoking status: Never Smoker  .  Smokeless tobacco: Never Used  Substance and Sexual Activity  . Alcohol use: No  . Drug use: No  . Sexual activity: Never  Lifestyle  . Physical activity:    Days per week: Not on file    Minutes per session: Not on file  . Stress: Not on file  Relationships  . Social connections:    Talks on phone: Not on file    Gets together: Not on file    Attends religious service: Not on file    Active member of club or organization: Not on file    Attends meetings of clubs or organizations: Not on file    Relationship status: Not on file  . Intimate partner violence:    Fear of current or ex partner: Not on file    Emotionally abused: Not on file    Physically abused: Not on file    Forced sexual activity: Not on file  Other Topics Concern  . Not on file  Social History Narrative   Her mother lives w/ her    husband had prostate ca , passed away Oct 06, 2013   takes care of mom,  who is blind , and two other fam members  Family History  Problem Relation Age of Onset  . Coronary artery disease Father   . Cancer Father        unsure of type  . Other Father        Guillain- barre syndrome  . Diabetes Other        uncles   . Hypertension Mother   . Anxiety disorder Mother   . Depression Mother   . Prostate cancer Other        grandfather  . Breast cancer Sister 53  . Depression Maternal Uncle   . Colon cancer Neg Hx   . Colon polyps Neg Hx   . Rectal cancer Neg Hx   . Stomach cancer Neg Hx      Allergies as of 03/09/2018      Reactions   Bee Venom Anaphylaxis   Doxycycline Hives, Itching   Penicillins Itching, Nausea And Vomiting, Swelling   syncope   Sertraline Other (See Comments)   Dry Mouth   Azithromycin Itching, Rash   Cerumenex [trolamine] Itching, Rash   Climara [estradiol] Itching, Rash   Nickel Rash      Medication List        Accurate as of 03/09/18  6:15 PM. Always use your most recent med list.          aspirin 81 MG tablet Take 81 mg  by mouth daily with breakfast.   azelastine 0.05 % ophthalmic solution Commonly known as:  OPTIVAR Place 1 drop into both eyes 2 (two) times daily.   CALCIUM + D PO Take 2 tablets by mouth daily with lunch.   carvedilol 6.25 MG tablet Commonly known as:  COREG Take 1 tablet (6.25 mg total) by mouth 2 (two) times daily with a meal.   cycloSPORINE 0.05 % ophthalmic emulsion Commonly known as:  RESTASIS Place 1 drop into both eyes every 12 (twelve) hours.   DELSYM 30 MG/5ML liquid Generic drug:  dextromethorphan Take 15 mg by mouth as needed for cough.   diphenhydrAMINE 25 MG tablet Commonly known as:  BENADRYL Take 25 mg by mouth every 6 (six) hours as needed for allergies. Reported on 08/06/2015   EPINEPHrine 0.3 mg/0.3 mL Soaj injection Commonly known as:  EPI-PEN Inject 0.3 mLs (0.3 mg total) into the muscle once.   famotidine 20 MG tablet Commonly known as:  PEPCID Take 1 tablet (20 mg total) by mouth daily.   fluticasone 50 MCG/ACT nasal spray Commonly known as:  FLONASE Place 1-2 sprays into both nostrils daily as needed for allergies or rhinitis.   GLUCOSAMINE CHOND TRIPLE/VIT D PO Take 1 tablet by mouth 2 (two) times daily.   montelukast 10 MG tablet Commonly known as:  SINGULAIR Take 1 tablet (10 mg total) by mouth at bedtime.   multivitamin with minerals Tabs tablet Take 1 tablet by mouth daily with breakfast.   naproxen sodium 220 MG tablet Commonly known as:  ALEVE Take 220 mg by mouth daily as needed.   Olopatadine HCl 0.2 % Soln Apply 1 drop to eye daily as needed.   SOLUBLE FIBER PO Take by mouth. Great Shape 100% Natural Soluble Fiber   spironolactone 25 MG tablet Commonly known as:  ALDACTONE Take 1 tablet (25 mg total) by mouth daily.   SYSTANE ULTRA 0.4-0.3 % Soln Generic drug:  Polyethyl Glycol-Propyl Glycol Place 1 drop into both eyes daily as needed (dry eyes).   Zoster Vaccine Adjuvanted injection Commonly known as:   SHINGRIX Inject 0.5 mLs into  the muscle once for 1 dose.          Objective:   Physical Exam BP 126/84 (BP Location: Left Arm, Patient Position: Sitting, Cuff Size: Small)   Pulse 84   Temp 98.1 F (36.7 C)   Resp 14   Ht 5\' 1"  (1.549 m)   Wt 172 lb 4 oz (78.1 kg)   SpO2 98%   BMI 32.55 kg/m  General: Well developed, NAD, see BMI.  Neck: No  thyromegaly  HEENT:  Normocephalic . Face symmetric, atraumatic Lungs:  CTA B Normal respiratory effort, no intercostal retractions, no accessory muscle use. Heart: RRR,  no murmur.  No pretibial edema bilaterally  Abdomen:  Not distended, soft, non-tender. No rebound or rigidity.   Skin: Exposed areas without rash. Not pale. Not jaundice Neurologic:  alert & oriented X3.  Speech normal, gait appropriate for age and unassisted Strength symmetric and appropriate for age.  Psych: Cognition and judgment appear intact.  Cooperative with normal attention span and concentration.  Behavior appropriate. No anxious or depressed appearing.     Assessment & Plan:    Assessment Prediabetes HTN Hyperlipidemia Thrombocytopenia, hematology eval 2013, RTC PRN Allergic rhinitis, urticaria, severe allergic reaction with hives in 2016.-- Dr Donneta Romberg Dry eye syndrome Psych: --Anxiety-depression: d/t husband's health (lost husband 2015), taking care of fam members --Hoarding  Behavior --MCI dx By neuropsychology-- 2018 Cholecystitis and choledocholithiasis: 05-2016, s/p surgery, ERCP  PLAN: Prediabetes: Check A1c HTN: Seems well controlled on carvedilol, Aldactone.  Checking labs. Hyperlipidemia: Checking labs.  Diet controlled. Allergies came, history of hives: Currently on Singulair, Pepcid.  Has an EpiPen.  Recommend no change. Anxiety, depression, hoarding behavior: Follow-up by psychiatry. RTC 6 months

## 2018-03-09 NOTE — Progress Notes (Signed)
Pre visit review using our clinic review tool, if applicable. No additional management support is needed unless otherwise documented below in the visit note. 

## 2018-03-09 NOTE — Assessment & Plan Note (Signed)
Prediabetes: Check A1c HTN: Seems well controlled on carvedilol, Aldactone.  Checking labs. Hyperlipidemia: Checking labs.  Diet controlled. Allergies came, history of hives: Currently on Singulair, Pepcid.  Has an EpiPen.  Recommend no change. Anxiety, depression, hoarding behavior: Follow-up by psychiatry. RTC 6 months

## 2018-03-09 NOTE — Assessment & Plan Note (Addendum)
-  Td 2014;  pneumonia shot  2003, 2008;  prevnar 2015;  zostavax 2007; shingrex : Rx provided -Female care :  +FH breast ca, MMG 10-2015  Per [facility], will schedule a MMG History of hysterectomy for a benign condition, no abnormal Pap smears.  No further Paps. -DEXA 10-2013 normal , on Ca and Vit D   Repeat bone density test. .-CCS: Cscope 10-2005 (-) and 02-2016  @ [facility], next per GI  -Labs: CMP, FLP, CBC, A1c

## 2018-03-11 ENCOUNTER — Encounter: Payer: Self-pay | Admitting: Internal Medicine

## 2018-03-16 ENCOUNTER — Ambulatory Visit (HOSPITAL_BASED_OUTPATIENT_CLINIC_OR_DEPARTMENT_OTHER)
Admission: RE | Admit: 2018-03-16 | Discharge: 2018-03-16 | Disposition: A | Discharge status: Home / Self Care | Payer: PPO | Source: Ambulatory Visit | Attending: Internal Medicine | Admitting: Internal Medicine

## 2018-03-16 DIAGNOSIS — E2839 Other primary ovarian failure: Secondary | ICD-10-CM | POA: Diagnosis not present

## 2018-03-16 DIAGNOSIS — Z1231 Encounter for screening mammogram for malignant neoplasm of breast: Secondary | ICD-10-CM | POA: Diagnosis not present

## 2018-03-16 DIAGNOSIS — Z803 Family history of malignant neoplasm of breast: Secondary | ICD-10-CM | POA: Diagnosis not present

## 2018-03-16 DIAGNOSIS — Z780 Asymptomatic menopausal state: Secondary | ICD-10-CM | POA: Insufficient documentation

## 2018-03-30 ENCOUNTER — Other Ambulatory Visit: Payer: Self-pay | Admitting: Internal Medicine

## 2018-04-19 ENCOUNTER — Other Ambulatory Visit: Payer: Self-pay | Admitting: Internal Medicine

## 2018-05-31 DIAGNOSIS — Z01419 Encounter for gynecological examination (general) (routine) without abnormal findings: Secondary | ICD-10-CM | POA: Diagnosis not present

## 2018-05-31 DIAGNOSIS — R350 Frequency of micturition: Secondary | ICD-10-CM | POA: Diagnosis not present

## 2018-05-31 DIAGNOSIS — Z6835 Body mass index (BMI) 35.0-35.9, adult: Secondary | ICD-10-CM | POA: Diagnosis not present

## 2018-06-01 ENCOUNTER — Ambulatory Visit (HOSPITAL_COMMUNITY)
Admission: EM | Admit: 2018-06-01 | Discharge: 2018-06-01 | Disposition: A | Discharge status: Home / Self Care | Payer: PPO | Attending: Family Medicine | Admitting: Family Medicine

## 2018-06-01 ENCOUNTER — Encounter (HOSPITAL_COMMUNITY): Payer: Self-pay | Admitting: Emergency Medicine

## 2018-06-01 ENCOUNTER — Ambulatory Visit (INDEPENDENT_AMBULATORY_CARE_PROVIDER_SITE_OTHER): Payer: PPO

## 2018-06-01 DIAGNOSIS — S62317A Displaced fracture of base of fifth metacarpal bone. left hand, initial encounter for closed fracture: Secondary | ICD-10-CM

## 2018-06-01 HISTORY — DX: Displaced fracture of base of fifth metacarpal bone, left hand, initial encounter for closed fracture: S62.317A

## 2018-06-01 NOTE — ED Provider Notes (Signed)
[facility]   [id number] 06/01/18 Arrival Time: 1027  ASSESSMENT & PLAN:  1. Closed displaced fracture of base of fifth metacarpal bone of left hand, initial encounter   No neurological or vascular compromise of LUE. I have personally viewed the imaging studies ordered this visit. Proximal 5th metacarpal fracture.  Imaging: Dg Hand Complete Left  Result Date: 06/01/2018 CLINICAL DATA:  Acute LEFT wrist pain following fall today. Initial encounter. EXAM: LEFT HAND - COMPLETE 3+ VIEW COMPARISON:  None. FINDINGS: A comminuted intra-articular slightly displaced fracture at the base of the 5th metacarpal noted. Possible MEDIAL subluxation of the 5th metacarpal at the carpal-5th metacarpal articulation noted. No other fracture or dislocation identified. Soft tissue swelling is present. IMPRESSION: Comminuted intra-articular fracture at the base of the 5th metacarpal with possible subluxation. Electronically Signed   By: Margarette Canada M.D.   On: 06/01/2018 16:37   Orders Placed This Encounter  Procedures  . DG Hand Complete Left  . Apply splint short arm (ulnar gutter)  . Apply sling arm foam  To wear until follow up.  Follow-up Information    Schedule an appointment as soon as possible for a visit  with Roseanne Kaufman, MD.   Specialty:  Orthopedic Surgery Contact information: [address] [place] [address] [place] [address] [phone]          OTC analgesics as needed. Written information on fracture and splint care given. To arrange orthopaedic follow up within one week. May f/u here as needed.  Reviewed expectations re: course of current medical issues. Questions answered. Outlined signs and symptoms indicating need for more acute intervention. Patient verbalized understanding. After Visit Summary given.  SUBJECTIVE: History from: patient. MAHIKA VANVOORHIS is a 77 y.o. female who reports persistent pain of her left hand with swelling. Onset abrupt beginning a  few hours ago. Injury/trama: yes, reports falling and hitting her L hand against concrete. Discomfort described as aching without radiation. Extremity sensation changes or weakness: none. Self treatment: has not tried OTCs for relief of pain. She is right handed. No previous L hand injury reported. Pain worse with use of L hand. Better when holding still.  No head injury reported. Ambulatory since falling.  ROS: As per HPI. All other systems negative.   OBJECTIVE:  Vitals:   06/01/18 1618  BP: (!) 155/79  Pulse: 98  Resp: 16  Temp: 97.8 F (36.6 C)  SpO2: 99%    General appearance: alert; no distress HEENT: [place]; AT; no wounds Neck: FROM; supple; no midline tenderness CV: RRR Lungs: CTAB; unlabored Extremities: no cyanosis or edema; symmetrical with no gross deformities; poorly localized tenderness over her left proximal hand, more dorsally, with mild swelling and no bruising; ROM: normal with reported discomfort CV: normal extremity capillary refill of LUE; 2+ radial pulse of LUE Skin: warm and dry Neurologic: normal reflexes of LUE; normal sensation of LUE Psychological: alert and cooperative; normal mood and affect   Allergies  Allergen Reactions  . Bee Venom Anaphylaxis  . Doxycycline Hives and Itching  . Penicillins Itching, Nausea And Vomiting and Swelling    syncope  . Sertraline Other (See Comments)    Dry Mouth  . Azithromycin Itching and Rash  . Cerumenex [Trolamine] Itching and Rash  . Climara [Estradiol] Itching and Rash  . Nickel Rash    Past Medical History:  Diagnosis Date  . Allergy   . Anemia    when had uterine fibroids  . Anxiety   . Cataract  very small  . Cholelithiasis   . DJD (degenerative joint disease)   . Dry eye syndrome   . Headache(784.0)   . Hyperlipidemia 01/20/2011  . Hypertension 09/29/2006  . Substance abuse ([place])   . Thrombocytopenia ([place]) 08/27/2011   hematology evaluation 08-2011, return prn  . Urticaria   . Vasomotor  rhinitis    Social History   Socioeconomic History  . Marital status: Widowed    Spouse name: Not on file  . Number of children: 2  . Years of education: Not on file  . Highest education level: Not on file  Occupational History  . 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 Financial resource strain: Not on file  . Food insecurity:    Worry: Not on file    Inability: Not on file  . Transportation needs:    Medical: Not on file    Non-medical: Not on file  Tobacco Use  . Smoking status: Never Smoker  . Smokeless tobacco: Never Used  Substance and Sexual Activity  . Alcohol use: No  . Drug use: No  . Sexual activity: Never  Lifestyle  . Physical activity:    Days per week: Not on file    Minutes per session: Not on file  . Stress: Not on file  Relationships  . Social connections:    Talks on phone: Not on file    Gets together: Not on file    Attends religious service: Not on file    Active member of club or organization: Not on file    Attends meetings of clubs or organizations: Not on file    Relationship status: Not on file  . Intimate partner violence:    Fear of current or ex partner: Not on file    Emotionally abused: Not on file    Physically abused: Not on file    Forced sexual activity: Not on file  Other Topics Concern  . Not on file  Social History Narrative   Her mother lives w/ her    husband had prostate ca , passed away 07-Oct-2013   takes care of mom,  who is blind , and two other fam members           Family History  Problem Relation Age of Onset  . Coronary artery disease Father   . Cancer Father        unsure of type  . Other Father        Guillain- barre syndrome  . Diabetes Other        uncles   . Hypertension Mother   . Anxiety disorder Mother   . Depression Mother   . Prostate cancer Other        grandfather  . Breast cancer Sister 84  . Depression Maternal Uncle   . Colon cancer Neg Hx   . Colon polyps  Neg Hx   . Rectal cancer Neg Hx   . Stomach cancer Neg Hx    Past Surgical History:  Procedure Laterality Date  . ABDOMINAL HYSTERECTOMY     no oophorectomy  . APPENDECTOMY    . CHOLECYSTECTOMY N/A 06/06/2016   Procedure: LAPAROSCOPIC CHOLECYSTECTOMY WITH INTRAOPERATIVE CHOLANGIOGRAM;  Surgeon: Johnathan Hausen, MD;  Location: [facility];  Service: General;  Laterality: N/A;  . COLONOSCOPY    . DILATION AND CURETTAGE OF UTERUS    . mole removals    . TONSILLECTOMY    . UMBILICAL HERNIA  REPAIR        Vanessa Kick, MD 06/03/18 [phone]

## 2018-06-01 NOTE — ED Triage Notes (Signed)
Pt states today she tripped and fell and landed on her L hand.

## 2018-06-01 NOTE — Discharge Instructions (Signed)
Please rest, ice and elevate the affected extremity. If not allergic, you may take Motrin 600mg every 8 hours or Tylenol 1000mg every 6 hours or as needed for discomfort. Follow up with orthopaedic surgery within one week for further evaluation. Please call for any appointment. Do not remove your splint. You may use a garbage bag while showering to keep your splint dry. Please return here if you are experiencing increased pain, tingling/numbness, swelling, redness, or fever. °

## 2018-06-03 DIAGNOSIS — S62317A Displaced fracture of base of fifth metacarpal bone. left hand, initial encounter for closed fracture: Secondary | ICD-10-CM | POA: Diagnosis not present

## 2018-06-03 DIAGNOSIS — M79642 Pain in left hand: Secondary | ICD-10-CM | POA: Diagnosis not present

## 2018-06-10 IMAGING — CR DG CHEST 2V
2 series · 2 of 2 positions shown · non-contrast
Comparison: 08/17/2014

CLINICAL DATA: Cough with chest pain.

EXAM:
CHEST  2 VIEW

[w chest pa]
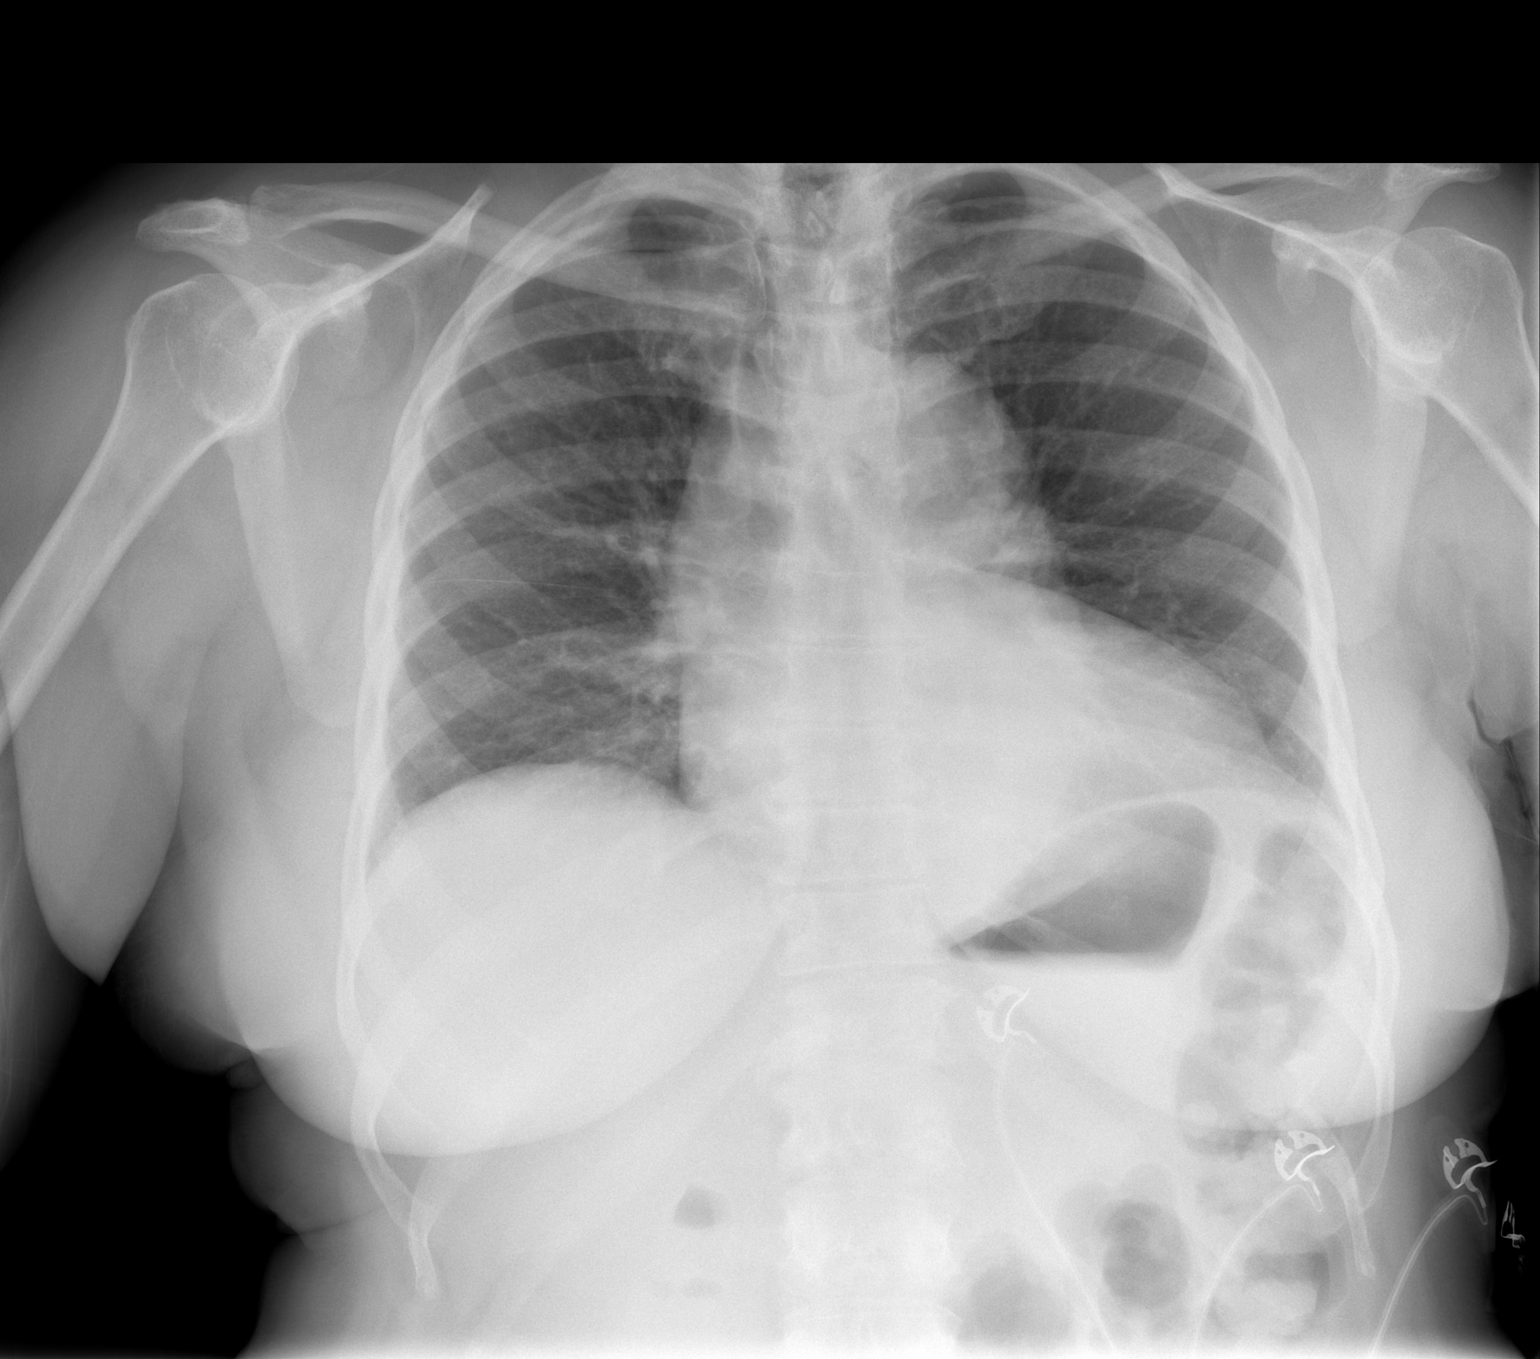

[w chest lat]
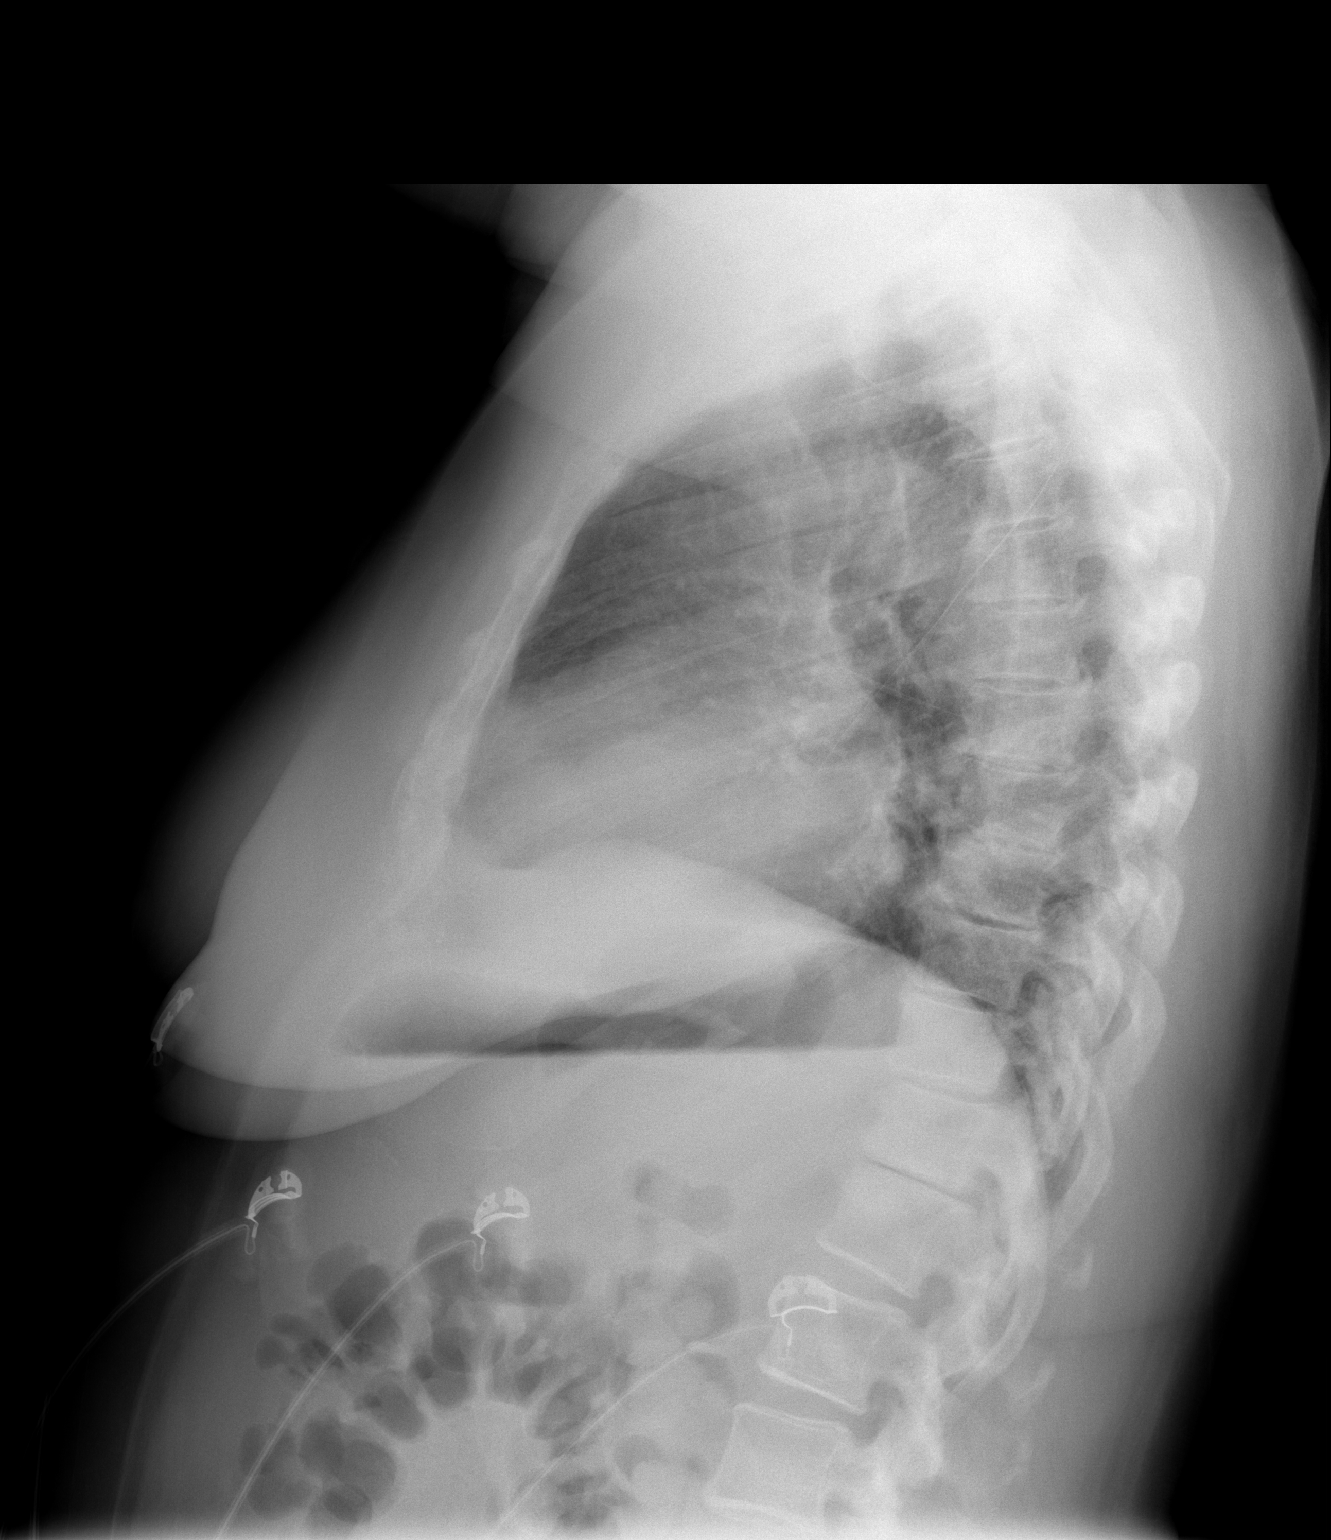

[2 of 2 positions shown; findings below may reference images not displayed]

FINDINGS: The cardio pericardial silhouette is enlarged. Prominence of the
thoracic aorta raises the question of thoracic aortic aneurysm. The
lungs are clear bilaterally. The visualized bony structures of the
thorax are intact.
IMPRESSION: 1. No acute cardiopulmonary findings.
2. Cardiomegaly with thoracic aortic prominence, unchanged in the
interval, but raising concern for aneurysm. CT chest could be used
to further evaluate as clinically warranted.

## 2018-06-20 ENCOUNTER — Encounter (HOSPITAL_COMMUNITY): Payer: Self-pay | Admitting: *Deleted

## 2018-06-20 ENCOUNTER — Ambulatory Visit (HOSPITAL_COMMUNITY)
Admission: EM | Admit: 2018-06-20 | Discharge: 2018-06-20 | Disposition: A | Discharge status: Home / Self Care | Payer: PPO | Attending: Internal Medicine | Admitting: Internal Medicine

## 2018-06-20 DIAGNOSIS — R319 Hematuria, unspecified: Secondary | ICD-10-CM | POA: Diagnosis not present

## 2018-06-20 DIAGNOSIS — E860 Dehydration: Secondary | ICD-10-CM

## 2018-06-20 LAB — POCT URINALYSIS DIP (DEVICE)
Bilirubin Urine: NEGATIVE
Glucose, UA: NEGATIVE mg/dL
Hgb urine dipstick: NEGATIVE
Ketones, ur: NEGATIVE mg/dL
Leukocytes, UA: NEGATIVE
Nitrite: NEGATIVE
Protein, ur: NEGATIVE mg/dL
Specific Gravity, Urine: >=1.03 (ref 1.005–1.030)
Urobilinogen, UA: 0.2 mg/dL (ref 0.0–1.0)
pH: 6 (ref 5.0–8.0)

## 2018-06-20 NOTE — ED Triage Notes (Signed)
Was recently treated for UTI with hematuria.  Started with hematuria, flank, abd pain again over past few days.  Had one episode vomiting today.

## 2018-06-20 NOTE — ED Provider Notes (Signed)
[facility]    CSN: [id number] Arrival date & time: 06/20/18  1126     History   Chief Complaint Chief Complaint  Patient presents with  . Hematuria  . Flank Pain    HPI Monique [name] is a 77 y.o. female.   77 yo female with HTN and thrombocytopenia presents to urgent care complaining of flank pain, nausea and hematuria.  The latter is due to darkening of her urine.  She has not seen blood or tea-colored urine.  She was seen at her GYN for possible cystitis 5 days ago and found to have moderate Hb in her urine.  Today, the patient felt nauseous but did not vomit.  She also admits to feeling lightheaded briefly. Her flank pain is not colicky and is related to how she moves sometimes.  She denies fever.      Past Medical History:  Diagnosis Date  . Allergy   . Anemia    when had uterine fibroids  . Anxiety   . Cataract    very small  . Cholelithiasis   . DJD (degenerative joint disease)   . Dry eye syndrome   . Headache(784.0)   . Hyperlipidemia 01/20/2011  . Hypertension 09/29/2006  . Substance abuse ([place])   . Thrombocytopenia ([place]) 08/27/2011   hematology evaluation 08-2011, return prn  . Urticaria   . Vasomotor rhinitis     Patient Active Problem List   Diagnosis Date Noted  . MCI (mild cognitive impairment) 02/06/2017  . 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  . GERD (gastroesophageal reflux disease) 06/05/2016  . PCP NOTES >>>>>>>>>>>>>>>>>>>>>>>>> 02/04/2016  . Severe allergic reaction 08/17/2014  . Allergic rhinitis 12/01/2011  . Thrombocytopenia ([place]) 08/27/2011  . Hyperlipidemia 01/20/2011  . Annual physical exam 09/18/2010  . Hyperglycemia 09/17/2009  . Backache 07/10/2008  . Anxiety and depression 01/19/2008  . HX OF GALLSTONE 10/20/2006  . Essential hypertension 09/29/2006    Past Surgical History:  Procedure Laterality Date  . ABDOMINAL HYSTERECTOMY     no oophorectomy  . APPENDECTOMY    . CHOLECYSTECTOMY N/A 06/06/2016   Procedure:  LAPAROSCOPIC CHOLECYSTECTOMY WITH INTRAOPERATIVE CHOLANGIOGRAM;  Surgeon: Johnathan Hausen, MD;  Location: [facility];  Service: General;  Laterality: N/A;  . COLONOSCOPY    . DILATION AND CURETTAGE OF UTERUS    . HERNIA REPAIR    . mole removals    . TONSILLECTOMY    . UMBILICAL HERNIA REPAIR      OB History   No obstetric history on file.      Home Medications    Prior to Admission medications   Medication Sig Start Date End Date Taking? Authorizing Provider  aspirin 81 MG tablet Take 81 mg by mouth daily with breakfast.    Yes [provider]  Calcium-Vitamin D (CALCIUM + D PO) Take 2 tablets by mouth daily with lunch.    Yes [provider]  carvedilol (COREG) 6.25 MG tablet Take 1 tablet (6.25 mg total) by mouth 2 (two) times daily with a meal. 03/30/18  Yes Paz, Alda Berthold, MD  famotidine (PEPCID) 20 MG tablet Take 1 tablet (20 mg total) by mouth daily. 03/09/18  Yes Paz, Alda Berthold, MD  fluticasone [facility]) 50 MCG/ACT nasal spray Place 1-2 sprays into both nostrils daily as needed for allergies or rhinitis.   Yes [provider]  Gluc-Chonn-MSM-Boswellia-Vit D (GLUCOSAMINE CHOND TRIPLE/VIT D PO) Take 1 tablet by mouth 2 (two) times daily.   Yes [provider]  Methylcellulose, Laxative, (  SOLUBLE FIBER PO) Take by mouth. Great Shape 100% Natural Soluble Fiber   Yes [provider]  montelukast (SINGULAIR) 10 MG tablet Take 1 tablet (10 mg total) by mouth at bedtime. 03/09/18  Yes Paz, Alda Berthold, MD  Multiple Vitamin (MULTIVITAMIN WITH MINERALS) TABS Take 1 tablet by mouth daily with breakfast.   Yes [provider]  naproxen sodium (ALEVE) 220 MG tablet Take 220 mg by mouth daily as needed.   Yes [provider]  spironolactone (ALDACTONE) 25 MG tablet Take 1 tablet (25 mg total) by mouth daily. 04/19/18  Yes Paz, Alda Berthold, MD  sulfamethoxazole-trimethoprim (BACTRIM,SEPTRA) 400-80 MG tablet Take 1 tablet by mouth 2 (two) times daily.    Yes [provider]  azelastine (OPTIVAR) 0.05 % ophthalmic solution Place 1 drop into both eyes 2 (two) times daily.    [provider]  cycloSPORINE (RESTASIS) 0.05 % ophthalmic emulsion Place 1 drop into both eyes every 12 (twelve) hours.     [provider]  dextromethorphan (DELSYM) 30 MG/5ML liquid Take 15 mg by mouth as needed for cough.    [provider]  diphenhydrAMINE (BENADRYL) 25 MG tablet Take 25 mg by mouth every 6 (six) hours as needed for allergies. Reported on 08/06/2015    [provider]  EPINEPHrine (EPIPEN 2-PAK) 0.3 mg/0.3 mL IJ SOAJ injection Inject 0.3 mLs (0.3 mg total) into the muscle once. Patient not taking: Reported on 03/09/2018 11/27/15   Colon Branch, MD  Olopatadine HCl 0.2 % SOLN Apply 1 drop to eye daily as needed.    [provider]  Polyethyl Glycol-Propyl Glycol (SYSTANE ULTRA) 0.4-0.3 % SOLN Place 1 drop into both eyes daily as needed (dry eyes).     [provider]    Family History Family History  Problem Relation Age of Onset  . Coronary artery disease Father   . Cancer Father        unsure of type  . Other Father        Guillain- barre syndrome  . Diabetes Other        uncles   . Hypertension Mother   . Anxiety disorder Mother   . Depression Mother   . Prostate cancer Other        grandfather  . Breast cancer Sister 60  . Depression Maternal Uncle   . Colon cancer Neg Hx   . Colon polyps Neg Hx   . Rectal cancer Neg Hx   . Stomach cancer Neg Hx     Social History Social History   Tobacco Use  . Smoking status: Never Smoker  . Smokeless tobacco: Never Used  Substance Use Topics  . Alcohol use: No  . Drug use: No     Allergies   Bee venom; Doxycycline; Penicillins; Sertraline; Azithromycin; Cerumenex [trolamine]; Climara [estradiol]; and Nickel   Review of Systems Review of Systems  Constitutional: Negative for chills and fever.  HENT: Negative for sore throat and  tinnitus.   Eyes: Negative for redness.  Respiratory: Negative for cough and shortness of breath.   Cardiovascular: Negative for chest pain and palpitations.  Gastrointestinal: Negative for abdominal pain, diarrhea, nausea and vomiting.  Genitourinary: Negative for dysuria, frequency and urgency.  Musculoskeletal: Negative for myalgias.  Skin: Negative for rash.       No lesions  Neurological: Negative for weakness.  Hematological: Does not bruise/bleed easily.  Psychiatric/Behavioral: Negative for suicidal ideas.     Physical Exam Triage Vital Signs ED  Triage Vitals  Enc Vitals Group     BP 06/20/18 1245 125/67     Pulse Rate 06/20/18 1245 83     Resp 06/20/18 1245 18     Temp 06/20/18 1245 97.8 F (36.6 C)     Temp Source 06/20/18 1245 Oral     SpO2 06/20/18 1245 99 %     Weight --      Height --      Head Circumference --      Peak Flow --      Pain Score 06/20/18 1246 8     Pain Loc --      Pain Edu? --      Excl. in [place]? --    No data found.  Updated Vital Signs BP 125/67   Pulse 83   Temp 97.8 F (36.6 C) (Oral)   Resp 18   SpO2 99%   Visual Acuity Right Eye Distance:   Left Eye Distance:   Bilateral Distance:    Right Eye Near:   Left Eye Near:    Bilateral Near:     Physical Exam Vitals signs and nursing note reviewed.  Constitutional:      General: She is not in acute distress.    Appearance: She is well-developed.  HENT:     Head: Normocephalic and atraumatic.  Eyes:     General: No scleral icterus.    Conjunctiva/sclera: Conjunctivae normal.     Pupils: Pupils are equal, round, and reactive to light.  Neck:     Musculoskeletal: Normal range of motion and neck supple.     Thyroid: No thyromegaly.     Vascular: No JVD.     Trachea: No tracheal deviation.  Cardiovascular:     Rate and Rhythm: Normal rate and regular rhythm.     Heart sounds: Normal heart sounds. No murmur. No friction rub. No gallop.   Pulmonary:     Effort: Pulmonary  effort is normal.     Breath sounds: Normal breath sounds.  Abdominal:     General: Bowel sounds are normal. There is no distension.     Palpations: Abdomen is soft.     Tenderness: There is no abdominal tenderness.  Musculoskeletal: Normal range of motion.  Lymphadenopathy:     Cervical: No cervical adenopathy.  Skin:    General: Skin is warm and dry.  Neurological:     Mental Status: She is alert and oriented to person, place, and time.     Cranial Nerves: No cranial nerve deficit.  Psychiatric:        Behavior: Behavior normal.        Thought Content: Thought content normal.        Judgment: Judgment normal.      UC Treatments / Results  Labs (all labs ordered are listed, but only abnormal results are displayed) Labs Reviewed  POCT URINALYSIS DIP (DEVICE)    EKG None  Radiology No results found.  Procedures Procedures (including critical care time)  Medications Ordered in UC Medications - No data to display  Initial Impression / Assessment and Plan / UC Course  I have reviewed the triage vital signs and the nursing notes.  Pertinent labs & imaging results that were available during my care of the patient were reviewed by me and considered in my medical decision making (see chart for details).     Spec grav high.  Encouraged water intake.  Patient states that she has dramamine at home for nausea  which she prefers over zofran. NO s/s of UTI, pyelo or sepsis.   Final Clinical Impressions(s) / UC Diagnoses   Final diagnoses:  Dehydration   Discharge Instructions   None    ED Prescriptions    None     Controlled Substance Prescriptions [place] Controlled Substance Registry consulted? Not Applicable   Harrie Foreman, MD 06/20/18 [phone]

## 2018-06-24 DIAGNOSIS — S62317A Displaced fracture of base of fifth metacarpal bone. left hand, initial encounter for closed fracture: Secondary | ICD-10-CM | POA: Diagnosis not present

## 2018-07-15 DIAGNOSIS — S62317A Displaced fracture of base of fifth metacarpal bone. left hand, initial encounter for closed fracture: Secondary | ICD-10-CM | POA: Diagnosis not present

## 2018-07-15 DIAGNOSIS — M79642 Pain in left hand: Secondary | ICD-10-CM | POA: Diagnosis not present

## 2018-07-15 DIAGNOSIS — Z4789 Encounter for other orthopedic aftercare: Secondary | ICD-10-CM | POA: Diagnosis not present

## 2018-07-22 DIAGNOSIS — M25632 Stiffness of left wrist, not elsewhere classified: Secondary | ICD-10-CM | POA: Diagnosis not present

## 2018-07-27 DIAGNOSIS — M25632 Stiffness of left wrist, not elsewhere classified: Secondary | ICD-10-CM | POA: Diagnosis not present

## 2018-08-04 DIAGNOSIS — M25632 Stiffness of left wrist, not elsewhere classified: Secondary | ICD-10-CM | POA: Diagnosis not present

## 2018-08-12 DIAGNOSIS — M25642 Stiffness of left hand, not elsewhere classified: Secondary | ICD-10-CM | POA: Diagnosis not present

## 2018-08-17 DIAGNOSIS — M79642 Pain in left hand: Secondary | ICD-10-CM | POA: Diagnosis not present

## 2018-08-18 ENCOUNTER — Ambulatory Visit: Payer: Self-pay | Admitting: *Deleted

## 2018-08-18 NOTE — Progress Notes (Addendum)
Subjective:   Monique [name] is a 78 y.o. female who presents for Medicare Annual (Subsequent) preventive examination.  Pt enjoys watching game shows and reads a little. Involved in church and choir.   Review of Systems: No ROS.  Medicare Wellness Visit. Additional risk factors are reflected in the social history. Cardiac Risk Factors include: dyslipidemia;advanced age (>35men, >49 women);hypertension Sleep patterns:  No issues Home Safety/Smoke Alarms: Feels safe in home. Smoke alarms in place. Lives alone in 1 story home. Steps going into house with hand rails. Step over tub with grab bar.   Female:      Mammo- 03/16/18       Dexa scan-  03/16/18      CCS- pt reports no longer doing screening Eye- Dr.McFarland yearly per pt.     Objective:     Vitals: BP 138/70 (BP Location: Left Arm, Patient Position: Sitting, Cuff Size: Normal)   Pulse 79   Ht 5\' 1"  (1.549 m)   Wt 174 lb 6.4 oz (79.1 kg)   SpO2 98%   BMI 32.95 kg/m   Body mass index is 32.95 kg/m.  Advanced Directives 08/19/2018 08/17/2017 06/07/2016 03/06/2016 06/18/2014 05/13/2014  Does Patient Have a Medical Advance Directive? Yes Yes Yes Yes No Yes  Type of Paramedic of [place];Living will [place];Living will [place];Living will Living will;Healthcare Power of [place] will  Does patient want to make changes to medical advance directive? No - Patient declined No - Patient declined No - Patient declined - - -  Copy of [place] in Chart? No - copy requested No - copy requested No - copy requested - - -  Would patient like information on creating a medical advance directive? - - - - No - patient declined information -    Tobacco Social History   Tobacco Use  Smoking Status Never Smoker  Smokeless Tobacco Never Used     Counseling given: Not Answered   Clinical Intake:     Pain : No/denies pain                  Past Medical History:  Diagnosis Date  . Allergy   . Anemia    when had uterine fibroids  . Anxiety   . Cataract    very small  . Cholelithiasis   . Closed fracture of base of fifth metacarpal bone of left hand 06/01/2018  . DJD (degenerative joint disease)   . Dry eye syndrome   . Headache(784.0)   . Hyperlipidemia 01/20/2011  . Hypertension 09/29/2006  . Substance abuse ([place])   . Thrombocytopenia ([place]) 08/27/2011   hematology evaluation 08-2011, return prn  . Urticaria   . Vasomotor rhinitis    Past Surgical History:  Procedure Laterality Date  . ABDOMINAL HYSTERECTOMY     no oophorectomy  . APPENDECTOMY    . CHOLECYSTECTOMY N/A 06/06/2016   Procedure: LAPAROSCOPIC CHOLECYSTECTOMY WITH INTRAOPERATIVE CHOLANGIOGRAM;  Surgeon: Johnathan Hausen, MD;  Location: [facility];  Service: General;  Laterality: N/A;  . DILATION AND CURETTAGE OF UTERUS    . HERNIA REPAIR    . mole removals    . TONSILLECTOMY    . UMBILICAL HERNIA REPAIR     Family History  Problem Relation Age of Onset  . Coronary artery disease Father   . Cancer Father        unsure of type  . Other Father  Guillain- barre syndrome  . Diabetes Other        uncles   . Hypertension Mother   . Anxiety disorder Mother   . Depression Mother   . Prostate cancer Other        grandfather  . Breast cancer Sister 56  . Depression Maternal Uncle   . Colon cancer Neg Hx   . Colon polyps Neg Hx   . Rectal cancer Neg Hx   . Stomach cancer Neg Hx    Social History   Socioeconomic History  . Marital status: Widowed    Spouse name: Not on file  . Number of children: 2  . Years of education: Not on file  . Highest education level: Not on file  Occupational History  . 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 Financial resource strain: Not on file  . Food insecurity:    Worry: Not on file    Inability: Not on file  . Transportation needs:    Medical: Not on file     Non-medical: Not on file  Tobacco Use  . Smoking status: Never Smoker  . Smokeless tobacco: Never Used  Substance and Sexual Activity  . Alcohol use: No  . Drug use: No  . Sexual activity: Never  Lifestyle  . Physical activity:    Days per week: Not on file    Minutes per session: Not on file  . Stress: Not on file  Relationships  . Social connections:    Talks on phone: Not on file    Gets together: Not on file    Attends religious service: Not on file    Active member of club or organization: Not on file    Attends meetings of clubs or organizations: Not on file    Relationship status: Not on file  Other Topics Concern  . Not on file  Social History Narrative   Her mother lives w/ her    husband had prostate ca , passed away 2013-10-12   takes care of mom,  who is blind , and two other fam members            Outpatient Encounter Medications as of 08/19/2018  Medication Sig  . aspirin 81 MG tablet Take 81 mg by mouth daily with breakfast.   . azelastine (OPTIVAR) 0.05 % ophthalmic solution Place 1 drop into both eyes 2 (two) times daily.  . Calcium-Vitamin D (CALCIUM + D PO) Take 2 tablets by mouth daily with lunch.   . carvedilol (COREG) 6.25 MG tablet Take 1 tablet (6.25 mg total) by mouth 2 (two) times daily with a meal.  . cycloSPORINE (RESTASIS) 0.05 % ophthalmic emulsion Place 1 drop into both eyes every 12 (twelve) hours.   Marland Kitchen dextromethorphan (DELSYM) 30 MG/5ML liquid Take 15 mg by mouth as needed for cough.  . diphenhydrAMINE (BENADRYL) 25 MG tablet Take 25 mg by mouth every 6 (six) hours as needed for allergies. Reported on 08/06/2015  . famotidine (PEPCID) 20 MG tablet Take 1 tablet (20 mg total) by mouth daily.  . fluticasone (FLONASE) 50 MCG/ACT nasal spray Place 1-2 sprays into both nostrils daily as needed for allergies or rhinitis.  . Gluc-Chonn-MSM-Boswellia-Vit D (GLUCOSAMINE CHOND TRIPLE/VIT D PO) Take 1 tablet by mouth 2 (two) times daily.  . Methylcellulose,  Laxative, (SOLUBLE FIBER PO) Take by mouth. Great Shape 100% Natural Soluble Fiber  . montelukast (SINGULAIR) 10 MG tablet Take 1 tablet (  10 mg total) by mouth at bedtime.  . Multiple Vitamin (MULTIVITAMIN WITH MINERALS) TABS Take 1 tablet by mouth daily with breakfast.  . naproxen sodium (ALEVE) 220 MG tablet Take 220 mg by mouth daily as needed.  . Olopatadine HCl 0.2 % SOLN Apply 1 drop to eye daily as needed.  Vladimir Faster Glycol-Propyl Glycol (SYSTANE ULTRA) 0.4-0.3 % SOLN Place 1 drop into both eyes daily as needed (dry eyes).   Marland Kitchen spironolactone (ALDACTONE) 25 MG tablet Take 1 tablet (25 mg total) by mouth daily.  Marland Kitchen EPINEPHrine (EPIPEN 2-PAK) 0.3 mg/0.3 mL IJ SOAJ injection Inject 0.3 mLs (0.3 mg total) into the muscle once. (Patient not taking: Reported on 03/09/2018)  . [DISCONTINUED] sulfamethoxazole-trimethoprim (BACTRIM,SEPTRA) 400-80 MG tablet Take 1 tablet by mouth 2 (two) times daily.   No facility-administered encounter medications on file as of 08/19/2018.     Activities of Daily Living In your present state of health, do you have any difficulty performing the following activities: 08/19/2018  Hearing? N  Vision? N  Difficulty concentrating or making decisions? N  Walking or climbing stairs? N  Dressing or bathing? N  Doing errands, shopping? N  Preparing Food and eating ? N  Using the Toilet? N  In the past six months, have you accidently leaked urine? N  Do you have problems with loss of bowel control? N  Managing your Medications? N  Managing your Finances? N  Housekeeping or managing your Housekeeping? N  Some recent data might be hidden    Patient Care Team: Colon Branch, MD as PCP - General Webb Laws, OD as Referring Physician (Optometry) Mosetta Anis, MD as Referring Physician (Allergy) Sanjuana Kava, MD as Referring Physician (Obstetrics and Gynecology) Pyrtle, Lajuan Lines, MD as Consulting Physician (Gastroenterology) Norma Fredrickson, MD as Consulting  Physician (Psychiatry) Renda Rolls, Jennefer Bravo, MD as Referring Physician (Dermatology) [facility] (Orth[facility] Surgery)    Assessment:   This is a routine wellness examination for [place]. Physical assessment deferred to PCP.  Exercise Activities and Dietary recommendations Current Exercise Habits: The patient does not participate in regular exercise at present, Exercise limited by: None identified Diet (meal preparation, eat out, water intake, caffeinated beverages, dairy products, fruits and vegetables): well balanced, on average, 2 meals per day   Goals    . Increase physical activity       Fall Risk Fall Risk  08/19/2018 08/17/2017 09/10/2016 02/04/2016 08/06/2015  Falls in the past year? 1 Yes No No No  Number falls in past yr: 0 1 - - -  Injury with Fall? 1 - - - -  Risk for fall due to : (No Data) - - - -  Risk for fall due to: Comment tripped over sidewalk - - - -  Follow up Education provided;Falls prevention discussed Education provided;Falls prevention discussed - - -  Depression Screen PHQ 2/9 Scores 08/19/2018 08/17/2017 09/10/2016 02/04/2016  PHQ - 2 Score 0 1 5 0  PHQ- 9 Score - - 18 -     Cognitive Function Ad8 score reviewed for issues:  Issues making decisions:no  Less interest in hobbies / activities:no  Repeats questions, stories (family complaining):no  Trouble using ordinary gadgets (microwave, computer, phone):no  Forgets the month or year: no  Mismanaging finances: no  Remembering appts:no  Daily problems with thinking and/or memory:no Ad8 score is=0     MMSE - Mini Mental State Exam 08/17/2017  Orientation to time 5  Orientation to Place 5  Registration 3  Attention/ Calculation  5  Recall 2  Language- name 2 objects 2  Language- repeat 1  Language- follow 3 step command 3  Language- read & follow direction 1  Write a sentence 1  Copy design 1  Total score 29        Immunization History  Administered Date(s) Administered  . Influenza  Split 05/02/2014  . Influenza Whole 05/14/2007, 04/09/2009, 04/03/2010  . Influenza, High Dose Seasonal PF 04/06/2015, 04/08/2018  . Influenza, Seasonal, Injecte, Preservative Fre 04/12/2013  . Influenza-Unspecified 08/17/2017  . Pneumococcal Conjugate-13 11/30/2013  . Pneumococcal Polysaccharide-23 06/30/2001, 05/14/2007  . Td 10/08/2004  . Tdap 04/12/2013  . Zoster 06/30/2005  . Zoster Recombinat (Shingrix) 03/11/2018, 05/20/2018   Screening Tests Health Maintenance  Topic Date Due  . MAMMOGRAM  03/17/2019  . TETANUS/TDAP  04/13/2023  . INFLUENZA VACCINE  Completed  . DEXA SCAN  Completed  . PNA vac Low Risk Adult  Completed        Plan:    Please schedule your next medicare wellness visit with me in 1 yr.  Continue to eat heart healthy diet (full of fruits, vegetables, whole grains, lean protein, water--limit salt, fat, and sugar intake) and increase physical activity as tolerated.  Continue doing brain stimulating activities (puzzles, reading, adult coloring books, staying active) to keep memory sharp.    I have personally reviewed and noted the following in the patient's chart:   . Medical and social history . Use of alcohol, tobacco or illicit drugs  . Current medications and supplements . Functional ability and status . Nutritional status . Physical activity . Advanced directives . List of other physicians . Hospitalizations, surgeries, and ER visits in previous 12 months . Vitals . Screenings to include cognitive, depression, and falls . Referrals and appointments  In addition, I have reviewed and discussed with patient certain preventive protocols, quality metrics, and best practice recommendations. A written personalized care plan for preventive services as well as general preventive health recommendations were provided to patient.     Naaman Plummer [place], [place]  08/19/2018    Kathlene November, MD

## 2018-08-19 ENCOUNTER — Encounter: Payer: Self-pay | Admitting: *Deleted

## 2018-08-19 ENCOUNTER — Ambulatory Visit (INDEPENDENT_AMBULATORY_CARE_PROVIDER_SITE_OTHER): Payer: PPO | Admitting: *Deleted

## 2018-08-19 VITALS — BP 138/70 | HR 79 | Ht 61.0 in | Wt 174.4 lb

## 2018-08-19 DIAGNOSIS — Z0000 Encounter for general adult medical examination without abnormal findings: Secondary | ICD-10-CM

## 2018-08-19 NOTE — Patient Instructions (Signed)
Please schedule your next medicare wellness visit with me in 1 yr.  Continue to eat heart healthy diet (full of fruits, vegetables, whole grains, lean protein, water--limit salt, fat, and sugar intake) and increase physical activity as tolerated.  Continue doing brain stimulating activities (puzzles, reading, adult coloring books, staying active) to keep memory sharp.    Monique [name] , Thank you for taking time to come for your Medicare Wellness Visit. I appreciate your ongoing commitment to your health goals. Please review the following plan we discussed and let me know if I can assist you in the future.   These are the goals we discussed: Goals    . Increase physical activity       This is a list of the screening recommended for you and due dates:  Health Maintenance  Topic Date Due  . Mammogram  03/17/2019  . Tetanus Vaccine  04/13/2023  . Flu Shot  Completed  . DEXA scan (bone density measurement)  Completed  . Pneumonia vaccines  Completed    Health Maintenance After Age 17 After age 12, you are at a higher risk for certain long-term diseases and infections as well as injuries from falls. Falls are a major cause of broken bones and head injuries in people who are older than age 71. Getting regular preventive care can help to keep you healthy and well. Preventive care includes getting regular testing and making lifestyle changes as recommended by your health care provider. Talk with your health care provider about:  Which screenings and tests you should have. A screening is a test that checks for a disease when you have no symptoms.  A diet and exercise plan that is right for you. What should I know about screenings and tests to prevent falls? Screening and testing are the best ways to find a health problem early. Early diagnosis and treatment give you the best chance of managing medical conditions that are common after age 78. Certain conditions and lifestyle choices may make you  more likely to have a fall. Your health care provider may recommend:  Regular vision checks. Poor vision and conditions such as cataracts can make you more likely to have a fall. If you wear glasses, make sure to get your prescription updated if your vision changes.  Medicine review. Work with your health care provider to regularly review all of the medicines you are taking, including over-the-counter medicines. Ask your health care provider about any side effects that may make you more likely to have a fall. Tell your health care provider if any medicines that you take make you feel dizzy or sleepy.  Osteoporosis screening. Osteoporosis is a condition that causes the bones to get weaker. This can make the bones weak and cause them to break more easily.  Blood pressure screening. Blood pressure changes and medicines to control blood pressure can make you feel dizzy.  Strength and balance checks. Your health care provider may recommend certain tests to check your strength and balance while standing, walking, or changing positions.  Foot health exam. Foot pain and numbness, as well as not wearing proper footwear, can make you more likely to have a fall.  Depression screening. You may be more likely to have a fall if you have a fear of falling, feel emotionally low, or feel unable to do activities that you used to do.  Alcohol use screening. Using too much alcohol can affect your balance and may make you more likely to have a fall.  What actions can I take to lower my risk of falls? General instructions  Talk with your health care provider about your risks for falling. Tell your health care provider if: ? You fall. Be sure to tell your health care provider about all falls, even ones that seem minor. ? You feel dizzy, sleepy, or off-balance.  Take over-the-counter and prescription medicines only as told by your health care provider. These include any supplements.  Eat a healthy diet and maintain  a healthy weight. A healthy diet includes low-fat dairy products, low-fat (lean) meats, and fiber from whole grains, beans, and lots of fruits and vegetables. Home safety  Remove any tripping hazards, such as rugs, cords, and clutter.  Install safety equipment such as grab bars in bathrooms and safety rails on stairs.  Keep rooms and walkways well-lit. Activity   Follow a regular exercise program to stay fit. This will help you maintain your balance. Ask your health care provider what types of exercise are appropriate for you.  If you need a cane or walker, use it as recommended by your health care provider.  Wear supportive shoes that have nonskid soles. Lifestyle  Do not drink alcohol if your health care provider tells you not to drink.  If you drink alcohol, limit how much you have: ? 0-1 drink a day for women. ? 0-2 drinks a day for men.  Be aware of how much alcohol is in your drink. In the [place] one drink equals one typical bottle of beer (12 oz), one-half glass of wine (5 oz), or one shot of hard liquor (1 oz).  Do not use any products that contain nicotine or tobacco, such as cigarettes and e-cigarettes. If you need help quitting, ask your health care provider. Summary  Having a healthy lifestyle and getting preventive care can help to protect your health and wellness after age 48.  Screening and testing are the best way to find a health problem early and help you avoid having a fall. Early diagnosis and treatment give you the best chance for managing medical conditions that are more common for people who are older than age 33.  Falls are a major cause of broken bones and head injuries in people who are older than age 90. Take precautions to prevent a fall at home.  Work with your health care provider to learn what changes you can make to improve your health and wellness and to prevent falls. This information is not intended to replace advice given to you by your health  care provider. Make sure you discuss any questions you have with your health care provider. Document Released: 04/29/2017 Document Revised: 04/29/2017 Document Reviewed: 04/29/2017 Elsevier Interactive Patient Education  2019 [employer].

## 2018-08-20 ENCOUNTER — Ambulatory Visit (HOSPITAL_COMMUNITY): Payer: Self-pay | Admitting: Psychiatry

## 2018-08-26 DIAGNOSIS — M25642 Stiffness of left hand, not elsewhere classified: Secondary | ICD-10-CM | POA: Diagnosis not present

## 2018-08-31 ENCOUNTER — Ambulatory Visit (INDEPENDENT_AMBULATORY_CARE_PROVIDER_SITE_OTHER): Payer: PPO | Admitting: Psychiatry

## 2018-08-31 ENCOUNTER — Encounter (HOSPITAL_COMMUNITY): Payer: Self-pay | Admitting: Psychiatry

## 2018-08-31 VITALS — BP 120/77 | HR 90 | Ht 61.0 in | Wt 175.6 lb

## 2018-08-31 DIAGNOSIS — F418 Other specified anxiety disorders: Secondary | ICD-10-CM

## 2018-08-31 DIAGNOSIS — F4320 Adjustment disorder, unspecified: Secondary | ICD-10-CM

## 2018-08-31 NOTE — Progress Notes (Signed)
Over:  Psychiatric Initial Adult Assessment   Patient Identification: Monique [name] MRN:  [id number] Date of Evaluation:  08/31/2018 Referral Source: Dr. Si Raider and Dr. Larose Kells Chief Complaint:   Visit Diagnosis adjustment disorder with an anxious mood state  History of Present  This patient is doing very well.  At this time her mother was hospitalized is now at a nursing home and is actually stable.  Her mother still recognizes her and still makes her needs known.  The patient herself is doing well.  She is in good spirits.  Her health is good.  Her blood pressure is well controlled.  Financially stable.  The patient keeps busy going to choir Bible study and church.  She loves her home and she knows it.  Patient denies any depression.  She denies any mania.  She is sleeping and eating very well.  She is got good energy.  She has no problems concentrating.  She has a good sense of work.  She is not suicidal.  She denies the use of alcohol.  Patient reads regularly watches TV and likes using coloring books.  The patient drives without problem.  She is functioning extremely well.  She shows no evidence of a cognitive disorder.  She does her own clothing does her own cooking does her own bills.  She is functioning very.  Initially 2 years ago she was diagnosed with mild cognitive impairment.  At this time she shows no functional disabilities.  She has no evidence of a psychiatric condition at this time. (Hypo) Manic Symptoms:   Anxiety Symptoms:   Psychotic Symptoms:   PTSD Symptoms:   Past Psychiatric History: Past use of Lexapro and Zoloft which cause side effects. Previous Psychotropic Medications: Yes   Substance Abuse History in the last 12 months: No  Consequences of Substance Abuse: Negative  Past Medical History:  Past Medical History:  Diagnosis Date  . Allergy   . Anemia    when had uterine fibroids  . Anxiety   . Cataract    very small  . Cholelithiasis   . Closed fracture  of base of fifth metacarpal bone of left hand 06/01/2018  . DJD (degenerative joint disease)   . Dry eye syndrome   . Headache(784.0)   . Hyperlipidemia 01/20/2011  . Hypertension 09/29/2006  . Substance abuse ([place])   . Thrombocytopenia ([place]) 08/27/2011   hematology evaluation 08-2011, return prn  . Urticaria   . Vasomotor rhinitis     Past Surgical History:  Procedure Laterality Date  . ABDOMINAL HYSTERECTOMY     no oophorectomy  . APPENDECTOMY    . CHOLECYSTECTOMY N/A 06/06/2016   Procedure: LAPAROSCOPIC CHOLECYSTECTOMY WITH INTRAOPERATIVE CHOLANGIOGRAM;  Surgeon: Johnathan Hausen, MD;  Location: [facility];  Service: General;  Laterality: N/A;  . DILATION AND CURETTAGE OF UTERUS    . HERNIA REPAIR    . mole removals    . TONSILLECTOMY    . UMBILICAL HERNIA REPAIR      Family Psychiatric History:   Family History:  Family History  Problem Relation Age of Onset  . Coronary artery disease Father   . Cancer Father        unsure of type  . Other Father        Guillain- barre syndrome  . Diabetes Other        uncles   . Hypertension Mother   . Anxiety disorder Mother   . Depression Mother   . Prostate cancer Other  grandfather  . Breast cancer Sister 68  . Depression Maternal Uncle   . Colon cancer Neg Hx   . Colon polyps Neg Hx   . Rectal cancer Neg Hx   . Stomach cancer Neg Hx     Social History:   Social History   Socioeconomic History  . Marital status: Widowed    Spouse name: Not on file  . Number of children: 2  . Years of education: Not on file  . Highest education level: Not on file  Occupational History  . 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 Financial resource strain: Not on file  . Food insecurity:    Worry: Not on file    Inability: Not on file  . Transportation needs:    Medical: Not on file    Non-medical: Not on file  Tobacco Use  . Smoking status: Never Smoker  . Smokeless tobacco: Never Used  Substance  and Sexual Activity  . Alcohol use: No  . Drug use: No  . Sexual activity: Never  Lifestyle  . Physical activity:    Days per week: Not on file    Minutes per session: Not on file  . Stress: Not on file  Relationships  . Social connections:    Talks on phone: Not on file    Gets together: Not on file    Attends religious service: Not on file    Active member of club or organization: Not on file    Attends meetings of clubs or organizations: Not on file    Relationship status: Not on file  Other Topics Concern  . Not on file  Social History Narrative   Her mother lives w/ her    husband had prostate ca , passed away 2013-10-06   takes care of mom,  who is blind , and two other fam members            Additional Social History:   Allergies:   Allergies  Allergen Reactions  . Bee Venom Anaphylaxis  . Doxycycline Hives and Itching  . Penicillins Itching, Nausea And Vomiting and Swelling    syncope  . Sertraline Other (See Comments)    Dry Mouth  . Azithromycin Itching and Rash  . Cerumenex [Trolamine] Itching and Rash  . Climara [Estradiol] Itching and Rash  . Nickel Rash    Metabolic Disorder Labs: Lab Results  Component Value Date   HGBA1C 6.0 03/09/2018   No results found for: PROLACTIN Lab Results  Component Value Date   CHOL 161 03/09/2018   TRIG 131.0 03/09/2018   HDL 51.10 03/09/2018   CHOLHDL 3 03/09/2018   VLDL 26.2 03/09/2018   LDLCALC 84 03/09/2018   LDLCALC 86 02/05/2017     Current Medications: Current Outpatient Medications  Medication Sig Dispense Refill  . aspirin 81 MG tablet Take 81 mg by mouth daily with breakfast.     . azelastine (OPTIVAR) 0.05 % ophthalmic solution Place 1 drop into both eyes 2 (two) times daily.    . Calcium-Vitamin D (CALCIUM + D PO) Take 2 tablets by mouth daily with lunch.     . carvedilol (COREG) 6.25 MG tablet Take 1 tablet (6.25 mg total) by mouth 2 (two) times daily with a meal. 180 tablet 2  . cycloSPORINE  (RESTASIS) 0.05 % ophthalmic emulsion Place 1 drop into both eyes every 12 (twelve) hours.     Marland Kitchen dextromethorphan (DELSYM) 30 MG/5ML liquid  Take 15 mg by mouth as needed for cough.    . diphenhydrAMINE (BENADRYL) 25 MG tablet Take 25 mg by mouth every 6 (six) hours as needed for allergies. Reported on 08/06/2015    . famotidine (PEPCID) 20 MG tablet Take 1 tablet (20 mg total) by mouth daily.    . fluticasone (FLONASE) 50 MCG/ACT nasal spray Place 1-2 sprays into both nostrils daily as needed for allergies or rhinitis.    . Gluc-Chonn-MSM-Boswellia-Vit D (GLUCOSAMINE CHOND TRIPLE/VIT D PO) Take 1 tablet by mouth 2 (two) times daily.    . Methylcellulose, Laxative, (SOLUBLE FIBER PO) Take by mouth. Great Shape 100% Natural Soluble Fiber    . montelukast (SINGULAIR) 10 MG tablet Take 1 tablet (10 mg total) by mouth at bedtime. 90 tablet 3  . Multiple Vitamin (MULTIVITAMIN WITH MINERALS) TABS Take 1 tablet by mouth daily with breakfast.    . naproxen sodium (ALEVE) 220 MG tablet Take 220 mg by mouth daily as needed.    . Olopatadine HCl 0.2 % SOLN Apply 1 drop to eye daily as needed.    Vladimir Faster Glycol-Propyl Glycol (SYSTANE ULTRA) 0.4-0.3 % SOLN Place 1 drop into both eyes daily as needed (dry eyes).     Marland Kitchen spironolactone (ALDACTONE) 25 MG tablet Take 1 tablet (25 mg total) by mouth daily. 90 tablet 2  . EPINEPHrine (EPIPEN 2-PAK) 0.3 mg/0.3 mL IJ SOAJ injection Inject 0.3 mLs (0.3 mg total) into the muscle once. (Patient not taking: Reported on 03/09/2018) 1 Device 1   No current facility-administered medications for this visit.     Neurologic: Headache: No Seizure: No Paresthesias:No  Musculoskeletal: Strength & Muscle Tone: within normal limits Gait & Station: normal Patient leans right  Psychiatric Specialty Exam: ROS  Blood pressure 120/77, pulse 90, height 5\' 1"  (1.549 m), weight 175 lb 9.6 oz (79.7 kg), SpO2 99 %.Body mass index is 33.18 kg/m.  General Appearance: Casual  Eye  Contact:  Good  Speech:  Clear and Coherent  Volume:   Mood:  Negative  Affect:  Congruent  Thought Process:  Goal Directed  Orientation:  NA  Thought Content:  Logical  Suicidal Thoughts:  No  Homicidal Thoughts:  No  Memory:  Negative  Judgement:  Fair  Insight:  Good  Psychomotor Activity:  Normal  Concentration:    Recall:  Good  Fund of Knowledge:Good  Language: Fair  Akathisia:  No  Handed:  Right  AIMS (if indicated):    Assets:  Communication Skills  ADL's:  Intact  Cognition: WNL  Sleep:      Treatment Plan Summary: At this time the patient is doing very well.  Her only problem was the possibility of an adjustment disorder with an anxious mood state.  She took BuSpar for small.  Time.  She is off all psychiatric medications at this time.  He is cognitively very intact.  At this time I am going to release her from my care and told her to return anytime if there are new problems.  I think she has no psychiatric illness at all and she has a cognitive problem it is only evident by testing.  In terms of functioning she is functioning at the highest level for a 78 year old individual.  Today we ended care. Jerral Ralph, MD 3/3/20203:08 PM

## 2018-09-08 ENCOUNTER — Other Ambulatory Visit: Payer: Self-pay

## 2018-09-08 ENCOUNTER — Encounter: Payer: Self-pay | Admitting: Internal Medicine

## 2018-09-08 ENCOUNTER — Ambulatory Visit (INDEPENDENT_AMBULATORY_CARE_PROVIDER_SITE_OTHER): Payer: PPO | Admitting: Internal Medicine

## 2018-09-08 VITALS — BP 122/70 | HR 73 | Temp 98.1°F | Resp 16 | Ht 61.0 in | Wt 174.2 lb

## 2018-09-08 DIAGNOSIS — I10 Essential (primary) hypertension: Secondary | ICD-10-CM

## 2018-09-08 DIAGNOSIS — F419 Anxiety disorder, unspecified: Secondary | ICD-10-CM | POA: Diagnosis not present

## 2018-09-08 DIAGNOSIS — Z23 Encounter for immunization: Secondary | ICD-10-CM | POA: Diagnosis not present

## 2018-09-08 DIAGNOSIS — F329 Major depressive disorder, single episode, unspecified: Secondary | ICD-10-CM

## 2018-09-08 DIAGNOSIS — F32A Depression, unspecified: Secondary | ICD-10-CM

## 2018-09-08 DIAGNOSIS — F423 Hoarding disorder: Secondary | ICD-10-CM

## 2018-09-08 LAB — BASIC METABOLIC PANEL
BUN: 14 mg/dL (ref 6–23)
CO2: 28 mEq/L (ref 19–32)
Calcium: 9.5 mg/dL (ref 8.4–10.5)
Chloride: 105 mEq/L (ref 96–112)
Creatinine, Ser: 0.86 mg/dL (ref 0.40–1.20)
GFR: 77.18 mL/min (ref >60.00)
Glucose, Bld: 95 mg/dL (ref 70–99)
Potassium: 3.8 mEq/L (ref 3.5–5.1)
Sodium: 142 mEq/L (ref 135–145)

## 2018-09-08 NOTE — Patient Instructions (Signed)
GO TO THE LAB : Get the blood work     GO TO THE FRONT DESK Schedule your next appointment  For a physical, fasting by [phone]

## 2018-09-08 NOTE — Progress Notes (Signed)
Subjective:    Patient ID: Monique [name], female    DOB: [birth year], 78 y.o.   MRN: [id number]  DOS:  09/08/2018 Type of visit - description: Routine office visit Back in December, had a fall, fractured her L hand, recovering. Anxiety depression: Note from psychiatry reviewed HTN: Good compliance to medication, ambulatory BPs when checked are normal    Review of Systems Denies chest pain no difficulty breathing No blood in the stool or in the urine  Past Medical History:  Diagnosis Date  . Allergy   . Anemia    when had uterine fibroids  . Anxiety   . Cataract    very small  . Cholelithiasis   . Closed fracture of base of fifth metacarpal bone of left hand 06/01/2018  . DJD (degenerative joint disease)   . Dry eye syndrome   . Headache(784.0)   . Hyperlipidemia 01/20/2011  . Hypertension 09/29/2006  . Substance abuse ([place])   . Thrombocytopenia ([place]) 08/27/2011   hematology evaluation 08-2011, return prn  . Urticaria   . Vasomotor rhinitis     Past Surgical History:  Procedure Laterality Date  . ABDOMINAL HYSTERECTOMY     no oophorectomy  . APPENDECTOMY    . CHOLECYSTECTOMY N/A 06/06/2016   Procedure: LAPAROSCOPIC CHOLECYSTECTOMY WITH INTRAOPERATIVE CHOLANGIOGRAM;  Surgeon: Johnathan Hausen, MD;  Location: [facility];  Service: General;  Laterality: N/A;  . DILATION AND CURETTAGE OF UTERUS    . HERNIA REPAIR    . mole removals    . TONSILLECTOMY    . UMBILICAL HERNIA REPAIR      Social History   Socioeconomic History  . Marital status: Widowed    Spouse name: Not on file  . Number of children: 2  . Years of education: Not on file  . Highest education level: Not on file  Occupational History  . 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 Financial resource strain: Not on file  . Food insecurity:    Worry: Not on file    Inability: Not on file  . Transportation needs:    Medical: Not on file    Non-medical: Not on file  Tobacco Use   . Smoking status: Never Smoker  . Smokeless tobacco: Never Used  Substance and Sexual Activity  . Alcohol use: No  . Drug use: No  . Sexual activity: Never  Lifestyle  . Physical activity:    Days per week: Not on file    Minutes per session: Not on file  . Stress: Not on file  Relationships  . Social connections:    Talks on phone: Not on file    Gets together: Not on file    Attends religious service: Not on file    Active member of club or organization: Not on file    Attends meetings of clubs or organizations: Not on file    Relationship status: Not on file  . Intimate partner violence:    Fear of current or ex partner: Not on file    Emotionally abused: Not on file    Physically abused: Not on file    Forced sexual activity: Not on file  Other Topics Concern  . Not on file  Social History Narrative   Her mother lives w/ her    husband had prostate ca , passed away 09-26-2013   takes care of mom,  who is blind , and two other fam members  Allergies as of 09/08/2018      Reactions   Bee Venom Anaphylaxis   Doxycycline Hives, Itching   Penicillins Itching, Nausea And Vomiting, Swelling   syncope   Sertraline Other (See Comments)   Dry Mouth   Azithromycin Itching, Rash   Cerumenex [trolamine] Itching, Rash   Climara [estradiol] Itching, Rash   Nickel Rash      Medication List       Accurate as of September 08, 2018 11:59 PM. Always use your most recent med list.        aspirin 81 MG tablet Take 81 mg by mouth daily with breakfast.   azelastine 0.05 % ophthalmic solution Commonly known as:  OPTIVAR Place 1 drop into both eyes 2 (two) times daily.   CALCIUM + D PO Take 2 tablets by mouth daily with lunch.   carvedilol 6.25 MG tablet Commonly known as:  COREG Take 1 tablet (6.25 mg total) by mouth 2 (two) times daily with a meal.   cycloSPORINE 0.05 % ophthalmic emulsion Commonly known as:  RESTASIS Place 1 drop into both eyes every 12  (twelve) hours.   Delsym 30 MG/5ML liquid Generic drug:  dextromethorphan Take 15 mg by mouth as needed for cough.   diphenhydrAMINE 25 MG tablet Commonly known as:  BENADRYL Take 25 mg by mouth every 6 (six) hours as needed for allergies. Reported on 08/06/2015   EPINEPHrine 0.3 mg/0.3 mL Soaj injection Commonly known as:  EpiPen 2-Pak Inject 0.3 mLs (0.3 mg total) into the muscle once.   famotidine 20 MG tablet Commonly known as:  PEPCID Take 1 tablet (20 mg total) by mouth daily.   fluticasone 50 MCG/ACT nasal spray Commonly known as:  FLONASE Place 1-2 sprays into both nostrils daily as needed for allergies or rhinitis.   GLUCOSAMINE CHOND TRIPLE/VIT D PO Take 1 tablet by mouth 2 (two) times daily.   montelukast 10 MG tablet Commonly known as:  SINGULAIR Take 1 tablet (10 mg total) by mouth at bedtime.   multivitamin with minerals Tabs tablet Take 1 tablet by mouth daily with breakfast.   naproxen sodium 220 MG tablet Commonly known as:  ALEVE Take 220 mg by mouth daily as needed.   Olopatadine HCl 0.2 % Soln Apply 1 drop to eye daily as needed.   SOLUBLE FIBER PO Take by mouth. Great Shape 100% Natural Soluble Fiber   spironolactone 25 MG tablet Commonly known as:  ALDACTONE Take 1 tablet (25 mg total) by mouth daily.   Systane Ultra 0.4-0.3 % Soln Generic drug:  Polyethyl Glycol-Propyl Glycol Place 1 drop into both eyes daily as needed (dry eyes).           Objective:   Physical Exam BP 122/70 (BP Location: Left Arm, Patient Position: Sitting, Cuff Size: Small)   Pulse 73   Temp 98.1 F (36.7 C) (Oral)   Resp 16   Ht 5\' 1"  (1.549 m)   Wt 174 lb 4 oz (79 kg)   SpO2 95%   BMI 32.92 kg/m  General:   Well developed, NAD, BMI noted. HEENT:  Normocephalic . Face symmetric, atraumatic Lungs:  CTA B Normal respiratory effort, no intercostal retractions, no accessory muscle use. Heart: RRR,  no murmur.  No pretibial edema bilaterally  Skin: Not  pale. Not jaundice Neurologic:  alert & oriented X3.  Speech normal, gait appropriate for age and unassisted Psych--  Cognition and judgment appear intact.  Cooperative with normal attention span and concentration.  Behavior appropriate. No anxious or depressed appearing.      Assessment    Assessment Prediabetes HTN Hyperlipidemia Thrombocytopenia, hematology eval 2013, RTC PRN Allergic rhinitis, urticaria, severe allergic reaction with hives in 2016.-- Dr Donneta Romberg Dry eye syndrome Psych: --Anxiety-depression: d/t husband's health (lost husband 2015), taking care of fam members --Hoarding  Behavior --MCI dx By neuropsychology-- 2018 Cholecystitis and choledocholithiasis: 05-2016, s/p surgery, ERCP  PLAN: HTN: Seems well controlled on Coreg Aldactone, last potassium 5.0, check a BMP, reports normal ambulatory BPs Anxiety, depression, hoarding behavior: Seen by psychiatry 08/31/2018, she was not taking any medication, felt to be doing well.  Was rec to RTC to psychiatry prn Left hand fracture: After a fall in December 2018, nonsurgical treatment, doing better.  She takes Aleve but only rarely.  See next Fall prevention: We discussed the issue although she is telling me that she has plenty of information and is taking precautions. Preventive care: PNM 23 today RTC CPX fasting 03-2019

## 2018-09-08 NOTE — Progress Notes (Signed)
Pre visit review using our clinic review tool, if applicable. No additional management support is needed unless otherwise documented below in the visit note. 

## 2018-09-09 DIAGNOSIS — M25642 Stiffness of left hand, not elsewhere classified: Secondary | ICD-10-CM | POA: Diagnosis not present

## 2018-09-09 NOTE — Assessment & Plan Note (Signed)
HTN: Seems well controlled on Coreg Aldactone, last potassium 5.0, check a BMP, reports normal ambulatory BPs Anxiety, depression, hoarding behavior: Seen by psychiatry 08/31/2018, she was not taking any medication, felt to be doing well.  Was rec to RTC to psychiatry prn Left hand fracture: After a fall in December 2018, nonsurgical treatment, doing better.  She takes Aleve but only rarely.  See next Fall prevention: We discussed the issue although she is telling me that she has plenty of information and is taking precautions. Preventive care: PNM 23 today RTC CPX fasting 03-2019

## 2018-09-13 DIAGNOSIS — S62317D Displaced fracture of base of fifth metacarpal bone. left hand, subsequent encounter for fracture with routine healing: Secondary | ICD-10-CM | POA: Diagnosis not present

## 2018-09-13 DIAGNOSIS — M79642 Pain in left hand: Secondary | ICD-10-CM | POA: Diagnosis not present

## 2018-11-09 DIAGNOSIS — J300 Vasomotor rhinitis: Secondary | ICD-10-CM | POA: Diagnosis not present

## 2018-11-09 DIAGNOSIS — L509 Urticaria, unspecified: Secondary | ICD-10-CM | POA: Diagnosis not present

## 2019-01-24 ENCOUNTER — Other Ambulatory Visit: Payer: Self-pay | Admitting: Internal Medicine

## 2019-02-02 ENCOUNTER — Encounter: Payer: Self-pay | Admitting: Internal Medicine

## 2019-02-02 ENCOUNTER — Ambulatory Visit (INDEPENDENT_AMBULATORY_CARE_PROVIDER_SITE_OTHER): Payer: PPO | Admitting: Internal Medicine

## 2019-02-02 DIAGNOSIS — Z20828 Contact with and (suspected) exposure to other viral communicable diseases: Secondary | ICD-10-CM | POA: Diagnosis not present

## 2019-02-02 DIAGNOSIS — Z20822 Contact with and (suspected) exposure to covid-19: Secondary | ICD-10-CM

## 2019-02-02 NOTE — Progress Notes (Signed)
Subjective:    Patient ID: Monique [name], female    DOB: October 11, 1940, 78 y.o.   MRN: [id number]  DOS:  02/02/2019 Type of visit - description: Attempted  to make this a video visit, due to technical difficulties from the patient side it was not possible  thus we proceeded with a Virtual Visit via Telephone    I connected with@   by telephone and verified that I am speaking with the correct person using two identifiers.  THIS ENCOUNTER IS A VIRTUAL VISIT DUE TO COVID-19 - PATIENT WAS NOT SEEN IN THE OFFICE. PATIENT HAS CONSENTED TO VIRTUAL VISIT / TELEMEDICINE VISIT   Location of patient: home  Location of provider: office  I discussed the limitations, risks, security and privacy concerns of performing an evaluation and management service by telephone and the availability of in person appointments. I also discussed with the patient that there may be a patient responsible charge related to this service. The patient expressed understanding and agreed to proceed.   History of Present Illness: Acute Yesterday, her son and her grandson visited her, they were indoors at the patient's house, they all wear masks. Later on, the son who is asymptomatic, called her and let her know that he has been in contact with w/ a  covid + person. Patient is concerned  Review of [place] herself remains asymptomatic: Denies fever chills No chest pain no difficulty breathing No nausea, vomiting or diarrhea.   Past Medical History:  Diagnosis Date  . Allergy   . Anemia    when had uterine fibroids  . Anxiety   . Cataract    very small  . Cholelithiasis   . Closed fracture of base of fifth metacarpal bone of left hand 06/01/2018  . DJD (degenerative joint disease)   . Dry eye syndrome   . Headache(784.0)   . Hyperlipidemia 01/20/2011  . Hypertension 09/29/2006  . Substance abuse ([place])   . Thrombocytopenia ([place]) 08/27/2011   hematology evaluation 08-2011, return prn  . Urticaria   .  Vasomotor rhinitis     Past Surgical History:  Procedure Laterality Date  . ABDOMINAL HYSTERECTOMY     no oophorectomy  . APPENDECTOMY    . CHOLECYSTECTOMY N/A 06/06/2016   Procedure: LAPAROSCOPIC CHOLECYSTECTOMY WITH INTRAOPERATIVE CHOLANGIOGRAM;  Surgeon: Johnathan Hausen, MD;  Location: [facility];  Service: General;  Laterality: N/A;  . DILATION AND CURETTAGE OF UTERUS    . HERNIA REPAIR    . mole removals    . TONSILLECTOMY    . UMBILICAL HERNIA REPAIR      Social History   Socioeconomic History  . Marital status: Widowed    Spouse name: Not on file  . Number of children: 2  . Years of education: Not on file  . Highest education level: Not on file  Occupational History  . 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 Financial resource strain: Not on file  . Food insecurity    Worry: Not on file    Inability: Not on file  . Transportation needs    Medical: Not on file    Non-medical: Not on file  Tobacco Use  . Smoking status: Never Smoker  . Smokeless tobacco: Never Used  Substance and Sexual Activity  . Alcohol use: No  . Drug use: No  . Sexual activity: Never  Lifestyle  . Physical activity    Days per week: Not on file  Minutes per session: Not on file  . Stress: Not on file  Relationships  . Social Herbalist on phone: Not on file    Gets together: Not on file    Attends religious service: Not on file    Active member of club or organization: Not on file    Attends meetings of clubs or organizations: Not on file    Relationship status: Not on file  . Intimate partner violence    Fear of current or ex partner: Not on file    Emotionally abused: Not on file    Physically abused: Not on file    Forced sexual activity: Not on file  Other Topics Concern  . Not on file  Social History Narrative   Her mother lives w/ her    husband had prostate ca , passed away Oct 06, 2013   takes care of mom,  who is blind , and two other  fam members              Allergies as of 02/02/2019      Reactions   Bee Venom Anaphylaxis   Doxycycline Hives, Itching   Penicillins Itching, Nausea And Vomiting, Swelling   syncope   Sertraline Other (See Comments)   Dry Mouth   Azithromycin Itching, Rash   Cerumenex [trolamine] Itching, Rash   Climara [estradiol] Itching, Rash   Nickel Rash      Medication List       Accurate as of February 02, 2019 11:59 PM. If you have any questions, ask your nurse or doctor.        aspirin 81 MG tablet Take 81 mg by mouth daily with breakfast.   azelastine 0.05 % ophthalmic solution Commonly known as: OPTIVAR Place 1 drop into both eyes 2 (two) times daily.   CALCIUM + D PO Take 2 tablets by mouth daily with lunch.   carvedilol 6.25 MG tablet Commonly known as: COREG Take 1 tablet (6.25 mg total) by mouth 2 (two) times daily with a meal.   cycloSPORINE 0.05 % ophthalmic emulsion Commonly known as: RESTASIS Place 1 drop into both eyes every 12 (twelve) hours.   Delsym 30 MG/5ML liquid Generic drug: dextromethorphan Take 15 mg by mouth as needed for cough.   diphenhydrAMINE 25 MG tablet Commonly known as: BENADRYL Take 25 mg by mouth every 6 (six) hours as needed for allergies. Reported on 08/06/2015   EPINEPHrine 0.3 mg/0.3 mL Soaj injection Commonly known as: EpiPen 2-Pak Inject 0.3 mLs (0.3 mg total) into the muscle once.   famotidine 20 MG tablet Commonly known as: PEPCID Take 1 tablet (20 mg total) by mouth daily.   fluticasone 50 MCG/ACT nasal spray Commonly known as: FLONASE Place 1-2 sprays into both nostrils daily as needed for allergies or rhinitis.   GLUCOSAMINE CHOND TRIPLE/VIT D PO Take 1 tablet by mouth 2 (two) times daily.   montelukast 10 MG tablet Commonly known as: SINGULAIR Take 1 tablet (10 mg total) by mouth at bedtime.   multivitamin with minerals Tabs tablet Take 1 tablet by mouth daily with breakfast.   naproxen sodium 220 MG tablet  Commonly known as: ALEVE Take 220 mg by mouth daily as needed.   Olopatadine HCl 0.2 % Soln Apply 1 drop to eye daily as needed.   SOLUBLE FIBER PO Take by mouth. Great Shape 100% Natural Soluble Fiber   spironolactone 25 MG tablet Commonly known as: ALDACTONE Take 1 tablet (25 mg total) by mouth  daily.   Systane Ultra 0.4-0.3 % Soln Generic drug: Polyethyl Glycol-Propyl Glycol Place 1 drop into both eyes daily as needed (dry eyes).           Objective:   Physical Exam There were no vitals taken for this visit. This is a virtual phone visit, she sounds well, alert oriented x3    Assessment     Assessment Prediabetes HTN Hyperlipidemia Thrombocytopenia, hematology eval 2013, RTC PRN Allergic rhinitis, urticaria, severe allergic reaction with hives in 2016.-- Dr Donneta Romberg Dry eye syndrome Psych: --Anxiety-depression: d/t husband's health (lost husband 2015), taking care of fam members --Hoarding  Behavior --MCI dx By neuropsychology-- 2018 Cholecystitis and choledocholithiasis: 05-2016, s/p surgery, ERCP  PLAN: COVID exposure: See HPI, exposure happened yesterday, apparently a low risk situation. Patient is asymptomatic. Plan: Patient will monitor herself for the next few days, if she developed mild symptoms she will call and will get her tested If she has sudden or intense symptoms such as fever, chills, diarrhea, myalgias, SOB: will go to the ER COVID-19 prevention discussed, she is doing a great job.  If her son visits her I suggested they stay outdoors if possible and continue wearing a mask. She verbalized understanding     I discussed the assessment and treatment plan with the patient. The patient was provided an opportunity to ask questions and all were answered. The patient agreed with the plan and demonstrated an understanding of the instructions.   The patient was advised to call back or seek an in-person evaluation if the symptoms worsen or if the  condition fails to improve as anticipated.  I provided 15 minutes of non-face-to-face time during this encounter.  Kathlene November, MD

## 2019-02-03 NOTE — Assessment & Plan Note (Signed)
COVID exposure: See HPI, exposure happened yesterday, apparently a low risk situation. Patient is asymptomatic. Plan: Patient will monitor herself for the next few days, if she developed mild symptoms she will call and will get her tested If she has sudden or intense symptoms such as fever, chills, diarrhea, myalgias, SOB: will go to the ER COVID-19 prevention discussed, she is doing a great job.  If her son visits her I suggested they stay outdoors if possible and continue wearing a mask. She verbalized understanding

## 2019-02-04 ENCOUNTER — Other Ambulatory Visit: Payer: Self-pay

## 2019-02-04 DIAGNOSIS — Z20822 Contact with and (suspected) exposure to covid-19: Secondary | ICD-10-CM

## 2019-02-05 LAB — NOVEL CORONAVIRUS, NAA: SARS-CoV-2, NAA: NOT DETECTED

## 2019-02-08 ENCOUNTER — Telehealth: Payer: Self-pay | Admitting: *Deleted

## 2019-02-08 NOTE — Telephone Encounter (Signed)
Patient called for results- informed negative. Patient was tested for possible exposure- she is not having symptoms. Patient advised to continue safe practices and contact PCP for any changes/concerns

## 2019-02-17 ENCOUNTER — Other Ambulatory Visit (HOSPITAL_BASED_OUTPATIENT_CLINIC_OR_DEPARTMENT_OTHER): Payer: Self-pay | Admitting: Internal Medicine

## 2019-02-17 DIAGNOSIS — Z1231 Encounter for screening mammogram for malignant neoplasm of breast: Secondary | ICD-10-CM

## 2019-03-15 ENCOUNTER — Encounter: Payer: Self-pay | Admitting: Internal Medicine

## 2019-03-15 DIAGNOSIS — H5203 Hypermetropia, bilateral: Secondary | ICD-10-CM | POA: Diagnosis not present

## 2019-03-15 DIAGNOSIS — H524 Presbyopia: Secondary | ICD-10-CM | POA: Diagnosis not present

## 2019-03-15 DIAGNOSIS — I10 Essential (primary) hypertension: Secondary | ICD-10-CM | POA: Diagnosis not present

## 2019-03-15 DIAGNOSIS — E119 Type 2 diabetes mellitus without complications: Secondary | ICD-10-CM | POA: Diagnosis not present

## 2019-03-15 DIAGNOSIS — H52223 Regular astigmatism, bilateral: Secondary | ICD-10-CM | POA: Diagnosis not present

## 2019-03-15 DIAGNOSIS — H04123 Dry eye syndrome of bilateral lacrimal glands: Secondary | ICD-10-CM | POA: Diagnosis not present

## 2019-03-15 DIAGNOSIS — H25813 Combined forms of age-related cataract, bilateral: Secondary | ICD-10-CM | POA: Diagnosis not present

## 2019-03-15 LAB — HM DIABETES EYE EXAM

## 2019-03-21 ENCOUNTER — Other Ambulatory Visit: Payer: Self-pay

## 2019-03-21 ENCOUNTER — Ambulatory Visit (INDEPENDENT_AMBULATORY_CARE_PROVIDER_SITE_OTHER): Payer: PPO | Admitting: Internal Medicine

## 2019-03-21 ENCOUNTER — Encounter: Payer: Self-pay | Admitting: Internal Medicine

## 2019-03-21 VITALS — BP 134/76 | HR 81 | Temp 97.0°F | Resp 16 | Ht 61.0 in | Wt 171.0 lb

## 2019-03-21 DIAGNOSIS — Z23 Encounter for immunization: Secondary | ICD-10-CM

## 2019-03-21 DIAGNOSIS — Z0000 Encounter for general adult medical examination without abnormal findings: Secondary | ICD-10-CM | POA: Diagnosis not present

## 2019-03-21 DIAGNOSIS — E785 Hyperlipidemia, unspecified: Secondary | ICD-10-CM | POA: Diagnosis not present

## 2019-03-21 DIAGNOSIS — R739 Hyperglycemia, unspecified: Secondary | ICD-10-CM | POA: Diagnosis not present

## 2019-03-21 DIAGNOSIS — F423 Hoarding disorder: Secondary | ICD-10-CM

## 2019-03-21 LAB — LIPID PANEL
Cholesterol: 190 mg/dL (ref 0–200)
HDL: 48.4 mg/dL (ref >39.00)
LDL Cholesterol: 111 mg/dL — ABNORMAL HIGH (ref 0–99)
NonHDL: 141.98
Total CHOL/HDL Ratio: 4
Triglycerides: 157 mg/dL — ABNORMAL HIGH (ref 0.0–149.0)
VLDL: 31.4 mg/dL (ref 0.0–40.0)

## 2019-03-21 LAB — CBC WITH DIFFERENTIAL/PLATELET
Basophils Absolute: 0.1 10*3/uL (ref 0.0–0.1)
Basophils Relative: 0.8 % (ref 0.0–3.0)
Eosinophils Absolute: 0.4 10*3/uL (ref 0.0–0.7)
Eosinophils Relative: 4.5 % (ref 0.0–5.0)
HCT: 43.6 % (ref 36.0–46.0)
Hemoglobin: 13.8 g/dL (ref 12.0–15.0)
Lymphocytes Relative: 25.4 % (ref 12.0–46.0)
Lymphs Abs: 2.4 10*3/uL (ref 0.7–4.0)
MCHC: 31.7 g/dL (ref 30.0–36.0)
MCV: 73 fl — ABNORMAL LOW (ref 78.0–100.0)
Monocytes Absolute: 0.7 10*3/uL (ref 0.1–1.0)
Monocytes Relative: 7 % (ref 3.0–12.0)
Neutro Abs: 5.9 10*3/uL (ref 1.4–7.7)
Neutrophils Relative %: 62.3 % (ref 43.0–77.0)
Platelets: 147 10*3/uL — ABNORMAL LOW (ref 150.0–400.0)
RBC: 5.97 Mil/uL — ABNORMAL HIGH (ref 3.87–5.11)
RDW: 14.8 % (ref 11.5–15.5)
WBC: 9.5 10*3/uL (ref 4.0–10.5)

## 2019-03-21 LAB — COMPREHENSIVE METABOLIC PANEL
ALT: 19 U/L (ref 0–35)
AST: 23 U/L (ref 0–37)
Albumin: 4.4 g/dL (ref 3.5–5.2)
Alkaline Phosphatase: 67 U/L (ref 39–117)
BUN: 10 mg/dL (ref 6–23)
CO2: 30 mEq/L (ref 19–32)
Calcium: 10.4 mg/dL (ref 8.4–10.5)
Chloride: 104 mEq/L (ref 96–112)
Creatinine, Ser: 1.02 mg/dL (ref 0.40–1.20)
GFR: 63.3 mL/min (ref >60.00)
Glucose, Bld: 96 mg/dL (ref 70–99)
Potassium: 4.5 mEq/L (ref 3.5–5.1)
Sodium: 142 mEq/L (ref 135–145)
Total Bilirubin: 0.7 mg/dL (ref 0.2–1.2)
Total Protein: 6.8 g/dL (ref 6.0–8.3)

## 2019-03-21 LAB — HEMOGLOBIN A1C: Hgb A1c MFr Bld: 6.1 % (ref 4.6–6.5)

## 2019-03-21 NOTE — Assessment & Plan Note (Addendum)
-  Td 2014;  pneumonia shot  2003, 2008, 2020;  prevnar 2015;  zostavax 2007; s/p  shingrex  X 2  Flu shot today -Female care :  +FH breast ca, MMG 02/2018 per [facility],has one scheduled for tomorrow History of hysterectomy for a benign condition, no abnormal Pap smears. Sees gyn regularly -DEXA 10-2013 and 2019:  normal , on Ca and Vit D    -CCS: Cscope 10-2005 (-) and 02-2016  @ [facility], next per GI -Diet and exercise discussed  -Labs: CMP, FLP, CBC, A1c

## 2019-03-21 NOTE — Progress Notes (Signed)
Pre visit review using our clinic review tool, if applicable. No additional management support is needed unless otherwise documented below in the visit note. 

## 2019-03-21 NOTE — Assessment & Plan Note (Signed)
Prediabetes: Diet and exercise recommended, check A1c. HTN: On Aldactone, carvedilol, she has no way to check her BPs at home.  BP today is very good.  Checking labs Hyperlipidemia: Diet controlled.  Checking labs Thrombocytopenia: Checking a CBC Anxiety, depression, hoarding behavior: Last visit with psychiatry 08-2018.  Felt to be cognitively intact and doing well.  No follow-up needed.  She seems to be doing well today. Social: She used  to take care of 3 family members, now lives by herself, her mother is in a nursing home and she lost 2 family members.  Again doing well emotionally. RTC 8 months

## 2019-03-21 NOTE — Patient Instructions (Signed)
GO TO THE LAB : Get the blood work     GO TO THE FRONT DESK Schedule your next appointment for a   checkup in 8 months 

## 2019-03-21 NOTE — Progress Notes (Signed)
Subjective:    Patient ID: Monique [name], female    DOB: 16-May-1941, 78 y.o.   MRN: [id number]  DOS:  03/21/2019 Type of visit - description: CPX No major concerns.   Review of Systems  A 14 point review of systems is negative    Past Medical History:  Diagnosis Date   Allergy    Anemia    when had uterine fibroids   Anxiety    Cataract    very small   Cholelithiasis    Closed fracture of base of fifth metacarpal bone of left hand 06/01/2018   DJD (degenerative joint disease)    Dry eye syndrome    Headache(784.0)    Hyperlipidemia 01/20/2011   Hypertension 09/29/2006   Substance abuse ([place])    Thrombocytopenia ([place]) 08/27/2011   hematology evaluation 08-2011, return prn   Urticaria    Vasomotor rhinitis     Past Surgical History:  Procedure Laterality Date   ABDOMINAL HYSTERECTOMY     no oophorectomy   APPENDECTOMY     CHOLECYSTECTOMY N/A 06/06/2016   Procedure: LAPAROSCOPIC CHOLECYSTECTOMY WITH INTRAOPERATIVE CHOLANGIOGRAM;  Surgeon: Johnathan Hausen, MD;  Location: [facility];  Service: General;  Laterality: N/A;   DILATION AND CURETTAGE OF UTERUS     HERNIA REPAIR     mole removals     TONSILLECTOMY     UMBILICAL HERNIA REPAIR      Social History   Socioeconomic History   Marital status: Widowed    Spouse name: Not on file   Number of children: 2   Years of education: Not on file   Highest education level: Not on file  Occupational History   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 strain: Not on file   Food insecurity    Worry: Not on file    Inability: Not on Lexicographer needs    Medical: Not on file    Non-medical: Not on file  Tobacco Use   Smoking status: Never Smoker   Smokeless tobacco: Never Used  Substance and Sexual Activity   Alcohol use: No   Drug use: No   Sexual activity: Never  Lifestyle   Physical activity    Days per week: Not on file     Minutes per session: Not on file   Stress: Not on file  Relationships   Social connections    Talks on phone: Not on file    Gets together: Not on file    Attends religious service: Not on file    Active member of club or organization: Not on file    Attends meetings of clubs or organizations: Not on file    Relationship status: Not on file   Intimate partner violence    Fear of current or ex partner: Not on file    Emotionally abused: Not on file    Physically abused: Not on file    Forced sexual activity: Not on file  Other Topics Concern   Not on file  Social History Narrative   Her mother lives in a NH    husband had prostate ca , passed away 2013/10/06     Lives by herself   2 sons:  [place] , [place]     Family History  Problem Relation Age of Onset   Coronary artery disease Father    Cancer Father        unsure of  type   Other Father        Guillain- barre syndrome   Diabetes Other        uncles    Hypertension Mother    Anxiety disorder Mother    Depression Mother    Prostate cancer Other        grandfather   Breast cancer Sister 62   Depression Maternal Uncle    Colon cancer Neg Hx    Colon polyps Neg Hx    Rectal cancer Neg Hx    Stomach cancer Neg Hx      Allergies as of 03/21/2019      Reactions   Bee Venom Anaphylaxis   Doxycycline Hives, Itching   Penicillins Itching, Nausea And Vomiting, Swelling   syncope   Sertraline Other (See Comments)   Dry Mouth   Azithromycin Itching, Rash   Cerumenex [trolamine] Itching, Rash   Climara [estradiol] Itching, Rash   Nickel Rash      Medication List       Accurate as of March 21, 2019  2:31 PM. If you have any questions, ask your nurse or doctor.        STOP taking these medications   montelukast 10 MG tablet Commonly known as: SINGULAIR Stopped by: Kathlene November, MD     TAKE these medications   aspirin 81 MG tablet Take 81 mg by mouth daily with breakfast.   azelastine  0.05 % ophthalmic solution Commonly known as: OPTIVAR Place 1 drop into both eyes 2 (two) times daily.   CALCIUM + D PO Take 2 tablets by mouth daily with lunch.   carvedilol 6.25 MG tablet Commonly known as: COREG Take 1 tablet (6.25 mg total) by mouth 2 (two) times daily with a meal.   cycloSPORINE 0.05 % ophthalmic emulsion Commonly known as: RESTASIS Place 1 drop into both eyes every 12 (twelve) hours.   Delsym 30 MG/5ML liquid Generic drug: dextromethorphan Take 15 mg by mouth as needed for cough.   diphenhydrAMINE 25 MG tablet Commonly known as: BENADRYL Take 25 mg by mouth every 6 (six) hours as needed for allergies. Reported on 08/06/2015   EPINEPHrine 0.3 mg/0.3 mL Soaj injection Commonly known as: EpiPen 2-Pak Inject 0.3 mLs (0.3 mg total) into the muscle once.   famotidine 20 MG tablet Commonly known as: PEPCID Take 1 tablet (20 mg total) by mouth daily.   fluticasone 50 MCG/ACT nasal spray Commonly known as: FLONASE Place 1-2 sprays into both nostrils daily as needed for allergies or rhinitis.   GLUCOSAMINE CHOND TRIPLE/VIT D PO Take 1 tablet by mouth 2 (two) times daily.   multivitamin with minerals Tabs tablet Take 1 tablet by mouth daily with breakfast.   naproxen sodium 220 MG tablet Commonly known as: ALEVE Take 220 mg by mouth daily as needed.   Olopatadine HCl 0.2 % Soln Apply 1 drop to eye daily as needed.   SOLUBLE FIBER PO Take by mouth. Great Shape 100% Natural Soluble Fiber   spironolactone 25 MG tablet Commonly known as: ALDACTONE Take 1 tablet (25 mg total) by mouth daily.   Systane Ultra 0.4-0.3 % Soln Generic drug: Polyethyl Glycol-Propyl Glycol Place 1 drop into both eyes daily as needed (dry eyes).           Objective:   Physical Exam BP 134/76 (BP Location: Left Arm, Patient Position: Sitting, Cuff Size: Small)    Pulse 81    Temp (!) 97 F (36.1 C) (Temporal)  Resp 16    Ht 5\' 1"  (1.549 m)    Wt 171 lb (77.6 kg)     SpO2 100%    BMI 32.31 kg/m  General: Well developed, NAD, BMI noted Neck: No  thyromegaly  HEENT:  Normocephalic . Face symmetric, atraumatic Lungs:  CTA B Normal respiratory effort, no intercostal retractions, no accessory muscle use. Heart: RRR,  no murmur.  No pretibial edema bilaterally  Abdomen:  Not distended, soft, non-tender. No rebound or rigidity.   Skin: Exposed areas without rash. Not pale. Not jaundice Neurologic:  alert & oriented X3.  Speech normal, gait appropriate for age and unassisted Strength symmetric and appropriate for age.  Psych: Cognition and judgment appear intact.  Cooperative with normal attention span and concentration.  Behavior appropriate. No anxious or depressed appearing.     Assessment    Assessment Prediabetes HTN Hyperlipidemia Thrombocytopenia, hematology eval 2013, RTC PRN Allergic rhinitis, urticaria, severe allergic reaction with hives in 2016.-- Dr Donneta Romberg Dry eye syndrome, cataracts Psych: --Anxiety-depression: d/t husband's health (lost husband 2015), taking care of fam members --Hoarding  Behavior --MCI dx By neuropsychology-- 2018 Cholecystitis and choledocholithiasis: 05-2016, s/p surgery, ERCP  PLAN: Prediabetes: Diet and exercise recommended, check A1c. HTN: On Aldactone, carvedilol, she has no way to check her BPs at home.  BP today is very good.  Checking labs Hyperlipidemia: Diet controlled.  Checking labs Thrombocytopenia: Checking a CBC Anxiety, depression, hoarding behavior: Last visit with psychiatry 08-2018.  Felt to be cognitively intact and doing well.  No follow-up needed.  She seems to be doing well today. Social: She used  to take care of 3 family members, now lives by herself, her mother is in a nursing home and she lost 2 family members.  Again doing well emotionally. RTC 8 months

## 2019-03-22 ENCOUNTER — Ambulatory Visit (HOSPITAL_BASED_OUTPATIENT_CLINIC_OR_DEPARTMENT_OTHER)
Admission: RE | Admit: 2019-03-22 | Discharge: 2019-03-22 | Disposition: A | Discharge status: Home / Self Care | Payer: PPO | Source: Ambulatory Visit | Attending: Internal Medicine | Admitting: Internal Medicine

## 2019-03-22 ENCOUNTER — Encounter (HOSPITAL_BASED_OUTPATIENT_CLINIC_OR_DEPARTMENT_OTHER): Payer: Self-pay

## 2019-03-22 DIAGNOSIS — Z1231 Encounter for screening mammogram for malignant neoplasm of breast: Secondary | ICD-10-CM | POA: Diagnosis not present

## 2019-03-25 ENCOUNTER — Telehealth: Payer: Self-pay | Admitting: Internal Medicine

## 2019-03-25 NOTE — Telephone Encounter (Signed)
Results given and documented in result note. 

## 2019-03-25 NOTE — Telephone Encounter (Signed)
Patient returning call for results. NT unavailable.     Copied from [place] [id number]. Topic: Quick Communication - Lab Results (Clinic Use ONLY) >> Mar 24, 2019  3:03 PM Damita Dunnings, CMA wrote: Called patient to inform them of lab results. When patient returns call, triage nurse may disclose results.

## 2019-03-28 DIAGNOSIS — R309 Painful micturition, unspecified: Secondary | ICD-10-CM | POA: Diagnosis not present

## 2019-03-28 DIAGNOSIS — B372 Candidiasis of skin and nail: Secondary | ICD-10-CM | POA: Diagnosis not present

## 2019-03-28 MED ORDER — ATORVASTATIN CALCIUM 10 MG PO TABS: 10.0000 mg | ORAL_TABLET | Freq: Every day | ORAL | 3 refills | Status: DC

## 2019-03-28 NOTE — Addendum Note (Signed)
Addended byDamita Dunnings D on: 03/28/2019 07:57 AM   Modules accepted: Orders

## 2019-03-29 DIAGNOSIS — H2513 Age-related nuclear cataract, bilateral: Secondary | ICD-10-CM | POA: Diagnosis not present

## 2019-03-29 DIAGNOSIS — H25013 Cortical age-related cataract, bilateral: Secondary | ICD-10-CM | POA: Diagnosis not present

## 2019-03-29 DIAGNOSIS — H25043 Posterior subcapsular polar age-related cataract, bilateral: Secondary | ICD-10-CM | POA: Diagnosis not present

## 2019-03-29 DIAGNOSIS — H401132 Primary open-angle glaucoma, bilateral, moderate stage: Secondary | ICD-10-CM | POA: Diagnosis not present

## 2019-03-29 DIAGNOSIS — H2511 Age-related nuclear cataract, right eye: Secondary | ICD-10-CM | POA: Diagnosis not present

## 2019-03-29 DIAGNOSIS — H18413 Arcus senilis, bilateral: Secondary | ICD-10-CM | POA: Diagnosis not present

## 2019-05-06 DIAGNOSIS — H52221 Regular astigmatism, right eye: Secondary | ICD-10-CM | POA: Diagnosis not present

## 2019-05-06 DIAGNOSIS — H2512 Age-related nuclear cataract, left eye: Secondary | ICD-10-CM | POA: Diagnosis not present

## 2019-05-06 DIAGNOSIS — H2511 Age-related nuclear cataract, right eye: Secondary | ICD-10-CM | POA: Diagnosis not present

## 2019-05-06 DIAGNOSIS — Z961 Presence of intraocular lens: Secondary | ICD-10-CM | POA: Diagnosis not present

## 2019-05-06 DIAGNOSIS — H5211 Myopia, right eye: Secondary | ICD-10-CM | POA: Diagnosis not present

## 2019-05-06 HISTORY — PX: EYE SURGERY: SHX253

## 2019-05-20 ENCOUNTER — Ambulatory Visit: Payer: Self-pay | Admitting: *Deleted

## 2019-05-20 DIAGNOSIS — Z961 Presence of intraocular lens: Secondary | ICD-10-CM | POA: Diagnosis not present

## 2019-05-20 DIAGNOSIS — H52223 Regular astigmatism, bilateral: Secondary | ICD-10-CM | POA: Diagnosis not present

## 2019-05-20 DIAGNOSIS — H524 Presbyopia: Secondary | ICD-10-CM | POA: Diagnosis not present

## 2019-05-20 DIAGNOSIS — H2512 Age-related nuclear cataract, left eye: Secondary | ICD-10-CM | POA: Diagnosis not present

## 2019-05-20 NOTE — Telephone Encounter (Signed)
Summary: slightly higher sugar    Pt called in stating she had cataract removal surgery on left eye today. Pt reported they had an issue with finding a vein but that overall they were okay. Main concern was patient having blood sugar of 200. Pt stated she was advised to check in with PCP and inform him. Pt would like to know what to do next. Please advise.      Patient had procedure this morning- she was told her glucose was reading 200. Patient was fasting and was told to follow up with PCP. Patient A1c was good 2 months ago. Patient does have appointment on Monday for labs if other testing needs to be done. Reason for Disposition . Blood glucose 70-240 mg/dL (3.9 -13.3 mmol/L)  Answer Assessment - Initial Assessment Questions 1. BLOOD GLUCOSE: "What is your blood glucose level?"      200- fasting- had juice right before midnight 2. ONSET: "When did you check the blood glucose?"     pre surgery this morning 3. USUAL RANGE: "What is your glucose level usually?" (e.g., usual fasting morning value, usual evening value)     unknown 4. KETONES: "Do you check for ketones (urine or blood test strips)?" If yes, ask: "What does the test show now?"      n/a 5. TYPE 1 or 2:  "Do you know what type of diabetes you have?"  (e.g., Type 1, Type 2, Gestational; doesn't know)      Not diagnosed 6. INSULIN: "Do you take insulin?" "What type of insulin(s) do you use? What is the mode of delivery? (syringe, pen; injection or pump)?"      no 7. DIABETES PILLS: "Do you take any pills for your diabetes?" If yes, ask: "Have you missed taking any pills recently?"     no 8. OTHER SYMPTOMS: "Do you have any symptoms?" (e.g., fever, frequent urination, difficulty breathing, dizziness, weakness, vomiting)     Frequent urination, sleeps alot 9. PREGNANCY: "Is there any chance you are pregnant?" "When was your last menstrual period?"     n/a  Protocols used: DIABETES - HIGH BLOOD SUGAR-A-AH

## 2019-05-20 NOTE — Telephone Encounter (Signed)
Please be sure the patient has an appointment to see me on Monday.  (3 days) A CBG of 200 is high but not alarming. Until next week, recommend to check CBGs, bring the log, watch diet. If CBGs more than 250 she needs to call the answering service again.

## 2019-05-20 NOTE — Telephone Encounter (Signed)
FYI

## 2019-05-20 NOTE — Telephone Encounter (Signed)
LMOM informing Pt to return call. Okay for PEC to discuss.  

## 2019-05-23 ENCOUNTER — Other Ambulatory Visit: Payer: Self-pay

## 2019-05-23 ENCOUNTER — Other Ambulatory Visit (INDEPENDENT_AMBULATORY_CARE_PROVIDER_SITE_OTHER): Payer: PPO

## 2019-05-23 DIAGNOSIS — R739 Hyperglycemia, unspecified: Secondary | ICD-10-CM | POA: Diagnosis not present

## 2019-05-23 DIAGNOSIS — E785 Hyperlipidemia, unspecified: Secondary | ICD-10-CM | POA: Diagnosis not present

## 2019-05-23 LAB — LIPID PANEL
Cholesterol: 124 mg/dL (ref 0–200)
HDL: 44.9 mg/dL (ref >39.00)
LDL Cholesterol: 58 mg/dL (ref 0–99)
NonHDL: 78.7
Total CHOL/HDL Ratio: 3
Triglycerides: 105 mg/dL (ref 0.0–149.0)
VLDL: 21 mg/dL (ref 0.0–40.0)

## 2019-05-23 LAB — BASIC METABOLIC PANEL
BUN: 20 mg/dL (ref 6–23)
CO2: 25 mEq/L (ref 19–32)
Calcium: 10 mg/dL (ref 8.4–10.5)
Chloride: 105 mEq/L (ref 96–112)
Creatinine, Ser: 1.18 mg/dL (ref 0.40–1.20)
GFR: 53.48 mL/min — ABNORMAL LOW (ref >60.00)
Glucose, Bld: 84 mg/dL (ref 70–99)
Potassium: 4.4 mEq/L (ref 3.5–5.1)
Sodium: 142 mEq/L (ref 135–145)

## 2019-05-23 LAB — AST: AST: 20 U/L (ref 0–37)

## 2019-05-23 LAB — ALT: ALT: 17 U/L (ref 0–35)

## 2019-05-23 NOTE — Telephone Encounter (Signed)
LMOM asking for call back.  

## 2019-05-23 NOTE — Telephone Encounter (Signed)
Left message for pt to return my call. I did not see this message when pt was here for her lab appt. She did ask to follow up on her elevated blood sugar from Friday. I did draw an extra lavender tube on this pt. Please advise if you want to add additional labs for blood sugar testing?  Also, do you still want to see pt today?

## 2019-05-23 NOTE — Telephone Encounter (Signed)
Please call the patient, let me know how her CBGs are

## 2019-05-23 NOTE — Telephone Encounter (Signed)
SW patient. Patient does not check blood sugars at home, does not have glucometer.  Per Dr. Larose Kells, BMP added to labs from this morning. Will call pt with results. Patient verbalized understanding.

## 2019-05-24 ENCOUNTER — Other Ambulatory Visit: Payer: PPO

## 2019-05-25 MED ORDER — BLOOD GLUCOSE METER KIT: PACK | 0 refills | Status: AC

## 2019-05-25 MED ORDER — ATORVASTATIN CALCIUM 10 MG PO TABS: 10.0000 mg | ORAL_TABLET | Freq: Every day | ORAL | 5 refills | Status: DC

## 2019-05-25 NOTE — Addendum Note (Signed)
Addended by: Damita Dunnings D on: 05/25/2019 08:30 AM   Modules accepted: Orders

## 2019-05-31 ENCOUNTER — Ambulatory Visit: Payer: Self-pay

## 2019-05-31 NOTE — Telephone Encounter (Signed)
Pt informed

## 2019-05-31 NOTE — Telephone Encounter (Signed)
  Returned call to patient who states that she has just picked up her blood glucose monitor and needs to know when she should check her BS. Patient was instructed to check BS daily in the AM before eating and once again about 2 hours after dinner. She was instructed to keep a record for Dr Larose Kells and call for [facility] over 150 for appointment.  She verbalized understanding of all instructions. She was encouraged to call back if she has any concerns or questions. Reason for Disposition . General information question, no triage required and triager able to answer question  Answer Assessment - Initial Assessment Questions 1. REASON FOR CALL or QUESTION: "What is your reason for calling today?" or "How can I best help you?" or "What question do you have that I can help answer?"     Patient needs to know when and how often she should check her BS.  Protocols used: INFORMATION ONLY CALL - NO TRIAGE-A-AH

## 2019-05-31 NOTE — Telephone Encounter (Signed)
Check your blood sugar  once a day     at different times of the day  GOALS: Fasting before a meal 70- 130 2 hours after a meal less than 180

## 2019-05-31 NOTE — Telephone Encounter (Signed)
Please advise on blood sugar goals.

## 2019-06-01 ENCOUNTER — Telehealth: Payer: Self-pay | Admitting: Internal Medicine

## 2019-06-01 NOTE — Telephone Encounter (Signed)
Patient called in having an issue with taking her blood sugar. Please advise

## 2019-06-01 NOTE — Telephone Encounter (Signed)
Please have schedule Pt for nurse visit on blood sugar teachings- instruct her to bring glucometer w/ her to appt. (30 minutes).

## 2019-06-02 ENCOUNTER — Ambulatory Visit (INDEPENDENT_AMBULATORY_CARE_PROVIDER_SITE_OTHER): Payer: PPO | Admitting: Internal Medicine

## 2019-06-02 ENCOUNTER — Other Ambulatory Visit: Payer: Self-pay

## 2019-06-02 DIAGNOSIS — R739 Hyperglycemia, unspecified: Secondary | ICD-10-CM

## 2019-06-02 NOTE — Progress Notes (Signed)
Pt here today for glucometer teaching. She has been concerned about elevated blood sugar readings. PCP recently sent glucometer and supplies to test twice daily to her pharmacy with instructions for her to come to the office if she needs assistance on how to operate the glucometer.   Showed Pt how to use glucometer and lancet. She appropriately demonstrated how to use before leaving office today. We discussed what her blood sugar goals should be and to call if consistently higher or lower.      Kathlene November, MD

## 2019-06-02 NOTE — Patient Instructions (Signed)
Check your blood sugar once a day: at different times of the day  GOALS:  Fasting before a meal 70-130 2 hours after a meal less than 180

## 2019-06-06 DIAGNOSIS — Z6834 Body mass index (BMI) 34.0-34.9, adult: Secondary | ICD-10-CM | POA: Diagnosis not present

## 2019-06-06 DIAGNOSIS — Z01419 Encounter for gynecological examination (general) (routine) without abnormal findings: Secondary | ICD-10-CM | POA: Diagnosis not present

## 2019-07-13 ENCOUNTER — Telehealth: Payer: Self-pay | Admitting: Internal Medicine

## 2019-07-13 NOTE — Telephone Encounter (Signed)
Copied from [place] [id number]. Topic: General - Other >> Jul 13, 2019  2:32 PM Rainey Pines A wrote: Patient stated that she needs to know if Dr. Larose Kells wants her to continue to check her blood sugar.Patient also wants to know if Dr.  Larose Kells would advise her to get covid vaccine with her being allergic  to so many things and using an epi-pen.  Please advise

## 2019-07-14 DIAGNOSIS — H401132 Primary open-angle glaucoma, bilateral, moderate stage: Secondary | ICD-10-CM | POA: Diagnosis not present

## 2019-07-14 NOTE — Telephone Encounter (Signed)
Left message on machine to call back  

## 2019-07-14 NOTE — Telephone Encounter (Signed)
Patient advised of Dr Larose Kells recommendations to continue to check glucose level 2-3 times a week and to schedule covid injection as he did not see any allergies to vaccines in her records. Number to call for appointment provided to patient.

## 2019-07-14 NOTE — Telephone Encounter (Signed)
Advise patient: It is okay to check less frequent her sugars,  perhaps 2-3 times a week. I reviewed her allergies, she is not allergic to any vaccination that I know of, I do recommend her to get the Covid vaccine when available to her. Needs to let the staff applying the vaccine know about all her allergies

## 2019-07-22 ENCOUNTER — Other Ambulatory Visit: Payer: Self-pay | Admitting: Internal Medicine

## 2019-07-22 DIAGNOSIS — J300 Vasomotor rhinitis: Secondary | ICD-10-CM | POA: Diagnosis not present

## 2019-07-22 DIAGNOSIS — L509 Urticaria, unspecified: Secondary | ICD-10-CM | POA: Diagnosis not present

## 2019-08-04 DIAGNOSIS — L509 Urticaria, unspecified: Secondary | ICD-10-CM | POA: Diagnosis not present

## 2019-08-05 ENCOUNTER — Other Ambulatory Visit: Payer: Self-pay | Admitting: Internal Medicine

## 2019-08-05 ENCOUNTER — Encounter: Payer: Self-pay | Admitting: Internal Medicine

## 2019-08-05 DIAGNOSIS — H401132 Primary open-angle glaucoma, bilateral, moderate stage: Secondary | ICD-10-CM | POA: Diagnosis not present

## 2019-08-05 DIAGNOSIS — Z961 Presence of intraocular lens: Secondary | ICD-10-CM | POA: Diagnosis not present

## 2019-08-08 ENCOUNTER — Other Ambulatory Visit: Payer: Self-pay

## 2019-08-08 MED ORDER — CARVEDILOL 6.25 MG PO TABS: 6.2500 mg | ORAL_TABLET | Freq: Two times a day (BID) | ORAL | 1 refills | Status: DC

## 2019-08-08 MED ORDER — SPIRONOLACTONE 25 MG PO TABS: 25.0000 mg | ORAL_TABLET | Freq: Every day | ORAL | 1 refills | Status: DC

## 2019-08-19 ENCOUNTER — Telehealth: Payer: Self-pay

## 2019-08-19 DIAGNOSIS — Z20828 Contact with and (suspected) exposure to other viral communicable diseases: Secondary | ICD-10-CM | POA: Diagnosis not present

## 2019-08-19 NOTE — Progress Notes (Signed)
Virtual Visit via Audio Note  I connected with patient on 08/22/19 at 10:00 AM EST by audio enabled telemedicine application and verified that I am speaking with the correct person using two identifiers.   THIS ENCOUNTER IS A VIRTUAL VISIT DUE TO COVID-19 - PATIENT WAS NOT SEEN IN THE OFFICE. PATIENT HAS CONSENTED TO VIRTUAL VISIT / TELEMEDICINE VISIT   Location of patient: home  Location of provider: office  I discussed the limitations of evaluation and management by telemedicine and the availability of in person appointments. The patient expressed understanding and agreed to proceed.   Subjective:   SIMRIT GOHLKE is a 79 y.o. female who presents for Medicare Annual (Subsequent) preventive examination.  Review of Systems:  Home Safety/Smoke Alarms: Feels safe in home. Smoke alarms in place.   Lives alone in 1 story home. Step over tub w/ grab bar.  Female:        Mammo- 03/22/19      Dexa scan-  03/16/18      CCS- No longer doing routine screening due to age.     Objective:     Vitals: Unable to assess. This visit is enabled though telemedicine due to Covid 19.   Advanced Directives 08/22/2019 08/19/2018 08/17/2017 06/07/2016 03/06/2016 06/18/2014 05/13/2014  Does Patient Have a Medical Advance Directive? Yes Yes Yes Yes Yes No Yes  Type of Paramedic of [place];Living will [place];Living will [place];Living will [place];Living will Living will;Healthcare Power of [place] will  Does patient want to make changes to medical advance directive? No - Patient declined No - Patient declined No - Patient declined No - Patient declined - - -  Copy of [place] in Chart? No - copy requested No - copy requested No - copy requested No - copy requested - - -  Would patient like information on creating a medical advance directive? No - Patient declined - - - - No - patient  declined information -    Tobacco Social History   Tobacco Use  Smoking Status Never Smoker  Smokeless Tobacco Never Used     Counseling given: Not Answered   Clinical Intake: Pain : No/denies pain     Past Medical History:  Diagnosis Date  . Allergy   . Anemia    when had uterine fibroids  . Anxiety   . Cataract    very small  . Cholelithiasis   . Closed fracture of base of fifth metacarpal bone of left hand 06/01/2018  . DJD (degenerative joint disease)   . Dry eye syndrome   . Headache(784.0)   . Hyperlipidemia 01/20/2011  . Hypertension 09/29/2006  . Substance abuse ([place])   . Thrombocytopenia ([place]) 08/27/2011   hematology evaluation 08-2011, return prn  . Urticaria   . Vasomotor rhinitis    Past Surgical History:  Procedure Laterality Date  . ABDOMINAL HYSTERECTOMY     no oophorectomy  . APPENDECTOMY    . CHOLECYSTECTOMY N/A 06/06/2016   Procedure: LAPAROSCOPIC CHOLECYSTECTOMY WITH INTRAOPERATIVE CHOLANGIOGRAM;  Surgeon: Johnathan Hausen, MD;  Location: [facility];  Service: General;  Laterality: N/A;  . DILATION AND CURETTAGE OF UTERUS    . EYE SURGERY Bilateral 05/06/2019  . HERNIA REPAIR    . mole removals    . TONSILLECTOMY    . UMBILICAL HERNIA REPAIR     Family History  Problem Relation Age of Onset  . Coronary artery disease Father   .  Cancer Father        unsure of type  . Other Father        Guillain- barre syndrome  . Diabetes Other        uncles   . Hypertension Mother   . Anxiety disorder Mother   . Depression Mother   . Prostate cancer Other        grandfather  . Breast cancer Sister 3  . Depression Maternal Uncle   . Colon cancer Neg Hx   . Colon polyps Neg Hx   . Rectal cancer Neg Hx   . Stomach cancer Neg Hx    Social History   Socioeconomic History  . Marital status: Widowed    Spouse name: Not on file  . Number of children: 2  . Years of education: Not on file  . Highest education level: Not on file  Occupational History  .  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 Smoking status: Never Smoker  . Smokeless tobacco: Never Used  Substance and Sexual Activity  . Alcohol use: No  . Drug use: No  . Sexual activity: Never  Other Topics Concern  . Not on file  Social History Narrative   Her mother lives in a NH    husband had prostate ca , passed away 2013/09/20     Lives by herself   2 sons:  [place] , [place]   Social Determinants of Health   Financial Resource Strain: Low Risk   . Difficulty of Paying Living Expenses: Not hard at all  Food Insecurity:   . Worried About Charity fundraiser in the Last Year: Not on file  . Ran Out of Food in the Last Year: Not on file  Transportation Needs: No Transportation Needs  . Lack of Transportation (Medical): No  . Lack of Transportation (Non-Medical): No  Physical Activity:   . Days of Exercise per Week: Not on file  . Minutes of Exercise per Session: Not on file  Stress:   . Feeling of Stress : Not on file  Social Connections:   . Frequency of Communication with Friends and Family: Not on file  . Frequency of Social Gatherings with Friends and Family: Not on file  . Attends Religious Services: Not on file  . Active Member of Clubs or Organizations: Not on file  . Attends Archivist Meetings: Not on file  . Marital Status: Not on file    Outpatient Encounter Medications as of 08/22/2019  Medication Sig  . aspirin 81 MG tablet Take 81 mg by mouth daily with breakfast.   . azelastine (OPTIVAR) 0.05 % ophthalmic solution Place 1 drop into both eyes 2 (two) times daily.  . blood glucose meter kit and supplies Dispense based on patient and insurance preference. Check blood sugars no more than twice daily  . Calcium-Vitamin D (CALCIUM + D PO) Take 2 tablets by mouth daily with lunch.   . carvedilol (COREG) 6.25 MG tablet Take 1 tablet (6.25 mg total) by mouth 2 (two) times daily with a meal.  . cycloSPORINE (RESTASIS) 0.05 %  ophthalmic emulsion Place 1 drop into both eyes every 12 (twelve) hours.   . diphenhydrAMINE (BENADRYL) 25 MG tablet Take 25 mg by mouth every 6 (six) hours as needed for allergies. Reported on 08/06/2015  . famotidine (PEPCID) 20 MG tablet Take 1 tablet (20 mg total) by mouth daily.  . fluticasone (FLONASE)  50 MCG/ACT nasal spray Place 1-2 sprays into both nostrils daily as needed for allergies or rhinitis.  . Gluc-Chonn-MSM-Boswellia-Vit D (GLUCOSAMINE CHOND TRIPLE/VIT D PO) Take 1 tablet by mouth 2 (two) times daily.  . Lancets (ONETOUCH DELICA PLUS [id number]) MISC CHECK BLOOD SUGAR NO MORE THAN TWICE DAILY  . Methylcellulose, Laxative, (SOLUBLE FIBER PO) Take by mouth. Great Shape 100% Natural Soluble Fiber  . Multiple Vitamin (MULTIVITAMIN WITH MINERALS) TABS Take 1 tablet by mouth daily with breakfast.  . naproxen sodium (ALEVE) 220 MG tablet Take 220 mg by mouth daily as needed.  . Olopatadine HCl 0.2 % SOLN Apply 1 drop to eye daily as needed.  Glory Rosebush ULTRA test strip CHECK BLOOD SUGAR NO MORE THAN TWICE DAILY  . Polyethyl Glycol-Propyl Glycol (SYSTANE ULTRA) 0.4-0.3 % SOLN Place 1 drop into both eyes daily as needed (dry eyes).   Marland Kitchen spironolactone (ALDACTONE) 25 MG tablet Take 1 tablet (25 mg total) by mouth daily.  Marland Kitchen atorvastatin (LIPITOR) 10 MG tablet Take 1 tablet (10 mg total) by mouth at bedtime. (Patient not taking: Reported on 08/22/2019)  . dextromethorphan (DELSYM) 30 MG/5ML liquid Take 15 mg by mouth as needed for cough.  . EPINEPHrine (EPIPEN 2-PAK) 0.3 mg/0.3 mL IJ SOAJ injection Inject 0.3 mLs (0.3 mg total) into the muscle once. (Patient not taking: Reported on 03/09/2018)   No facility-administered encounter medications on file as of 08/22/2019.    Activities of Daily Living In your present state of health, do you have any difficulty performing the following activities: 08/22/2019  Hearing? N  Vision? N  Difficulty concentrating or making decisions? N  Walking or  climbing stairs? N  Dressing or bathing? N  Doing errands, shopping? N  Preparing Food and eating ? N  Using the Toilet? N  In the past six months, have you accidently leaked urine? Y  Comment wears panty liner  Do you have problems with loss of bowel control? N  Managing your Medications? N  Managing your Finances? N  Housekeeping or managing your Housekeeping? N  Some recent data might be hidden    Patient Care Team: Colon Branch, MD as PCP - General Webb Laws, OD as Referring Physician (Optometry) Mosetta Anis, MD as Referring Physician (Allergy) Sanjuana Kava, MD as Referring Physician (Obstetrics and Gynecology) Pyrtle, Lajuan Lines, MD as Consulting Physician (Gastroenterology) Norma Fredrickson, MD as Consulting Physician (Psychiatry) Renda Rolls, Jennefer Bravo, MD as Referring Physician (Dermatology) [facility] (Orthopedic Surgery)    Assessment:   This is a routine wellness examination for [place]. Physical assessment deferred to PCP.  Exercise Activities and Dietary recommendations Current Exercise Habits: The patient does not participate in regular exercise at present, Exercise limited by: None identified Diet (meal preparation, eat out, water intake, caffeinated beverages, dairy products, fruits and vegetables): well balanced, on average, 2 meals per day   Goals    . Increase physical activity       Fall Risk Fall Risk  08/22/2019 08/19/2018 08/17/2017 09/10/2016 02/04/2016  Falls in the past year? 0 1 Yes No No  Number falls in past yr: 0 0 1 - -  Injury with Fall? 0 1 - - -  Risk for fall due to : - (No Data) - - -  Risk for fall due to: Comment - tripped over sidewalk - - -  Follow up Education provided;Falls prevention discussed Education provided;Falls prevention discussed Education provided;Falls prevention discussed - -   Depression Screen [facility] 2/9 Scores 08/22/2019 03/21/2019 09/08/2018 08/19/2018  PHQ - 2 Score 0 0 0 0  PHQ- 9 Score - 0 11 -     Cognitive  Function Ad8 score reviewed for issues:  Issues making decisions:no  Less interest in hobbies / activities:no  Repeats questions, stories (family complaining):no  Trouble using ordinary gadgets (microwave, computer, phone):no  Forgets the month or year: no  Mismanaging finances: no  Remembering appts:no  Daily problems with thinking and/or memory:no Ad8 score is=0    MMSE - Mini Mental State Exam 08/17/2017  Orientation to time 5  Orientation to Place 5  Registration 3  Attention/ Calculation 5  Recall 2  Language- name 2 objects 2  Language- repeat 1  Language- follow 3 step command 3  Language- read & follow direction 1  Write a sentence 1  Copy design 1  Total score 29        Immunization History  Administered Date(s) Administered  . Fluad Quad(high Dose 65+) 03/21/2019  . Influenza Split 05/02/2014  . Influenza Whole 05/14/2007, 04/09/2009, 04/03/2010  . Influenza, High Dose Seasonal PF 04/06/2015, 04/08/2018  . Influenza, Seasonal, Injecte, Preservative Fre 04/12/2013  . Influenza-Unspecified 08/17/2017  . Pneumococcal Conjugate-13 11/30/2013  . Pneumococcal Polysaccharide-23 06/30/2001, 05/14/2007, 09/08/2018  . Td 10/08/2004  . Tdap 04/12/2013  . Zoster 06/30/2005  . Zoster Recombinat (Shingrix) 03/11/2018, 05/20/2018    Screening Tests Health Maintenance  Topic Date Due  . MAMMOGRAM  03/21/2020  . TETANUS/TDAP  04/13/2023  . INFLUENZA VACCINE  Completed  . DEXA SCAN  Completed  . PNA vac Low Risk Adult  Completed      Plan:    Please schedule your next medicare wellness visit with me in 1 yr.  Continue to eat heart healthy diet (full of fruits, vegetables, whole grains, lean protein, water--limit salt, fat, and sugar intake) and increase physical activity as tolerated.  Continue doing brain stimulating activities (puzzles, reading, adult coloring books, staying active) to keep memory sharp.   Bring a copy of your living will and/or  healthcare power of attorney to your next office visit.    I have personally reviewed and noted the following in the patient's chart:   . Medical and social history . Use of alcohol, tobacco or illicit drugs  . Current medications and supplements . Functional ability and status . Nutritional status . Physical activity . Advanced directives . List of other physicians . Hospitalizations, surgeries, and ER visits in previous 12 months . Vitals . Screenings to include cognitive, depression, and falls . Referrals and appointments  In addition, I have reviewed and discussed with patient certain preventive protocols, quality metrics, and best practice recommendations. A written personalized care plan for preventive services as well as general preventive health recommendations were provided to patient.     Naaman Plummer [place], [place]  08/22/2019

## 2019-08-19 NOTE — Telephone Encounter (Signed)
Will send to [facility] as FYI.

## 2019-08-19 NOTE — Telephone Encounter (Signed)
Caller states a family member tested positive for Covid 19. Says she plans to get Covid tested herself today. Reports no symptoms of Covid at this time. Has Wellness appt to see Dr. Larose Kells on 08/22/19 at 10am - should it be cancelled/rescheduled?

## 2019-08-22 ENCOUNTER — Encounter: Payer: Self-pay | Admitting: *Deleted

## 2019-08-22 ENCOUNTER — Other Ambulatory Visit: Payer: Self-pay

## 2019-08-22 ENCOUNTER — Ambulatory Visit (INDEPENDENT_AMBULATORY_CARE_PROVIDER_SITE_OTHER): Payer: PPO | Admitting: *Deleted

## 2019-08-22 DIAGNOSIS — Z0000 Encounter for general adult medical examination without abnormal findings: Secondary | ICD-10-CM

## 2019-08-22 NOTE — Patient Instructions (Signed)
Please schedule your next medicare wellness visit with me in 1 yr.  Continue to eat heart healthy diet (full of fruits, vegetables, whole grains, lean protein, water--limit salt, fat, and sugar intake) and increase physical activity as tolerated.  Continue doing brain stimulating activities (puzzles, reading, adult coloring books, staying active) to keep memory sharp.   Bring a copy of your living will and/or healthcare power of attorney to your next office visit.   [name] , Thank you for taking time to come for your Medicare Wellness Visit. I appreciate your ongoing commitment to your health goals. Please review the following plan we discussed and let me know if I can assist you in the future.   These are the goals we discussed: Goals    . DIET - INCREASE WATER INTAKE    . Increase physical activity       This is a list of the screening recommended for you and due dates:  Health Maintenance  Topic Date Due  . Mammogram  03/21/2020  . Tetanus Vaccine  04/13/2023  . Flu Shot  Completed  . DEXA scan (bone density measurement)  Completed  . Pneumonia vaccines  Completed    Preventive Care 28 Years and Older, Female Preventive care refers to lifestyle choices and visits with your health care provider that can promote health and wellness. This includes:  A yearly physical exam. This is also called an annual well check.  Regular dental and eye exams.  Immunizations.  Screening for certain conditions.  Healthy lifestyle choices, such as diet and exercise. What can I expect for my preventive care visit? Physical exam Your health care provider will check:  Height and weight. These may be used to calculate body mass index (BMI), which is a measurement that tells if you are at a healthy weight.  Heart rate and blood pressure.  Your skin for abnormal spots. Counseling Your health care provider may ask you questions about:  Alcohol, tobacco, and drug use.  Emotional  well-being.  Home and relationship well-being.  Sexual activity.  Eating habits.  History of falls.  Memory and ability to understand (cognition).  Work and work Statistician.  Pregnancy and menstrual history. What immunizations do I need?  Influenza (flu) vaccine  This is recommended every year. Tetanus, diphtheria, and pertussis (Tdap) vaccine  You may need a Td booster every 10 years. Varicella (chickenpox) vaccine  You may need this vaccine if you have not already been vaccinated. Zoster (shingles) vaccine  You may need this after age 79. Pneumococcal conjugate (PCV13) vaccine  One dose is recommended after age 79. Pneumococcal polysaccharide (PPSV23) vaccine  One dose is recommended after age 79. Measles, mumps, and rubella (MMR) vaccine  You may need at least one dose of MMR if you were born in 1957 or later. You may also need a second dose. Meningococcal conjugate (MenACWY) vaccine  You may need this if you have certain conditions. Hepatitis A vaccine  You may need this if you have certain conditions or if you travel or work in places where you may be exposed to hepatitis A. Hepatitis B vaccine  You may need this if you have certain conditions or if you travel or work in places where you may be exposed to hepatitis B. Haemophilus influenzae type b (Hib) vaccine  You may need this if you have certain conditions. You may receive vaccines as individual doses or as more than one vaccine together in one shot (combination vaccines). Talk with your  health care provider about the risks and benefits of combination vaccines. What tests do I need? Blood tests  Lipid and cholesterol levels. These may be checked every 5 years, or more frequently depending on your overall health.  Hepatitis C test.  Hepatitis B test. Screening  Lung cancer screening. You may have this screening every year starting at age 79 if you have a 30-pack-year history of smoking and  currently smoke or have quit within the past 15 years.  Colorectal cancer screening. All adults should have this screening starting at age 79 and continuing until age 83. Your health care provider may recommend screening at age 79 if you are at increased risk. You will have tests every 1-10 years, depending on your results and the type of screening test.  Diabetes screening. This is done by checking your blood sugar (glucose) after you have not eaten for a while (fasting). You may have this done every 1-79 years.  Mammogram. This may be done every 1-2 years. Talk with your health care provider about how often you should have regular mammograms.  BRCA-related cancer screening. This may be done if you have a family history of breast, ovarian, tubal, or peritoneal cancers. Other tests  Sexually transmitted disease (STD) testing.  Bone density scan. This is done to screen for osteoporosis. You may have this done starting at age 35. Follow these instructions at home: Eating and drinking  Eat a diet that includes fresh fruits and vegetables, whole grains, lean protein, and low-fat dairy products. Limit your intake of foods with high amounts of sugar, saturated fats, and salt.  Take vitamin and mineral supplements as recommended by your health care provider.  Do not drink alcohol if your health care provider tells you not to drink.  If you drink alcohol: ? Limit how much you have to 0-1 drink a day. ? Be aware of how much alcohol is in your drink. In the [place] one drink equals one 12 oz bottle of beer (355 mL), one 5 oz glass of wine (148 mL), or one 1 oz glass of hard liquor (44 mL). Lifestyle  Take daily care of your teeth and gums.  Stay active. Exercise for at least 30 minutes on 5 or more days each week.  Do not use any products that contain nicotine or tobacco, such as cigarettes, e-cigarettes, and chewing tobacco. If you need help quitting, ask your health care provider.  If you are  sexually active, practice safe sex. Use a condom or other form of protection in order to prevent STIs (sexually transmitted infections).  Talk with your health care provider about taking a low-dose aspirin or statin. What's next?  Go to your health care provider once a year for a well check visit.  Ask your health care provider how often you should have your eyes and teeth checked.  Stay up to date on all vaccines. This information is not intended to replace advice given to you by your health care provider. Make sure you discuss any questions you have with your health care provider. Document Revised: 06/10/2018 Document Reviewed: 06/10/2018 Elsevier Patient Education  2020 [employer].

## 2019-08-23 ENCOUNTER — Telehealth: Payer: Self-pay | Admitting: Internal Medicine

## 2019-08-23 NOTE — Chronic Care Management (AMB) (Signed)
  Chronic Care Management   Note  08/23/2019 Name: Monique [name] MRN: [id number] DOB: 1941/05/26  Monique [name] is a 79 y.o. year old female who is a primary care patient of Colon Branch, MD. I reached out to [name] by phone today in response to a referral sent by [name]'s PCP, Colon Branch, MD.   [name] was given information about Chronic Care Management services today including:  1. CCM service includes personalized support from designated clinical staff supervised by her physician, including individualized plan of care and coordination with other care providers 2. 24/7 contact phone numbers for assistance for urgent and routine care needs. 3. Service will only be billed when office clinical staff spend 20 minutes or more in a month to coordinate care. 4. Only one practitioner may furnish and bill the service in a calendar month. 5. The patient may stop CCM services at any time (effective at the end of the month) by phone call to the office staff. 6. The patient will be responsible for cost sharing (co-pay) of up to 20% of the service fee (after annual deductible is met).  Patient agreed to services and verbal consent obtained.   Follow up plan:   Monique [name] [employer] Scheduler

## 2019-08-26 ENCOUNTER — Other Ambulatory Visit: Payer: Self-pay

## 2019-08-26 DIAGNOSIS — I10 Essential (primary) hypertension: Secondary | ICD-10-CM

## 2019-08-26 DIAGNOSIS — G3184 Mild cognitive impairment, so stated: Secondary | ICD-10-CM

## 2019-08-26 DIAGNOSIS — F423 Hoarding disorder: Secondary | ICD-10-CM

## 2019-08-26 DIAGNOSIS — E785 Hyperlipidemia, unspecified: Secondary | ICD-10-CM

## 2019-08-30 ENCOUNTER — Ambulatory Visit: Payer: PPO | Admitting: Pharmacist

## 2019-08-30 ENCOUNTER — Other Ambulatory Visit: Payer: Self-pay

## 2019-08-30 DIAGNOSIS — E785 Hyperlipidemia, unspecified: Secondary | ICD-10-CM

## 2019-08-30 DIAGNOSIS — R739 Hyperglycemia, unspecified: Secondary | ICD-10-CM

## 2019-08-30 DIAGNOSIS — J309 Allergic rhinitis, unspecified: Secondary | ICD-10-CM

## 2019-08-30 DIAGNOSIS — I10 Essential (primary) hypertension: Secondary | ICD-10-CM

## 2019-08-30 DIAGNOSIS — K219 Gastro-esophageal reflux disease without esophagitis: Secondary | ICD-10-CM

## 2019-08-30 NOTE — Chronic Care Management (AMB) (Signed)
Chronic Care Management Pharmacy  Name: YOCELIN VANLUE  MRN: [id number] DOB: 1941/02/14   Chief Complaint/ HPI  [name],  79 y.o. , female presents for their Initial CCM visit with the clinical pharmacist via telephone due to COVID-19 Pandemic.  PCP : Colon Branch, MD  Their chronic conditions include: Pre-DM, HTN, HLD, GERD, Allergic Rhinitis, Pain, Constipation, [place]  Office Visits: 08/22/19: Medicare Annual Wellness Exam w/ Naaman Plummer, RN - Goal to increase physical activity  06/02/19: glucometer teaching with Vilma Prader - Check 2 times per week  03/21/19: Visit w/ Dr. Larose Kells - Labs ordered (CMP, FLP, CBC, a1c). RTC 8 months    Consult Visit: 06/06/19: Ob/Gyn appt w/ Dr. Stann Mainland - gyn exam  Medications: Outpatient Encounter Medications as of 08/30/2019  Medication Sig Note  . aspirin 81 MG tablet Take 81 mg by mouth daily with breakfast.    . blood glucose meter kit and supplies Dispense based on patient and insurance preference. Check blood sugars no more than twice daily   . brimonidine (ALPHAGAN) 0.2 % ophthalmic solution Place into both eyes 3 (three) times daily. Dr. [name]   . Calcium-Vitamin D (CALCIUM + D PO) Take 2 tablets by mouth daily with lunch. Calcium 682m  Vitamin D 800 Units   . carvedilol (COREG) 6.25 MG tablet Take 1 tablet (6.25 mg total) by mouth 2 (two) times daily with a meal.   . cetirizine (ZYRTEC) 10 MG tablet Take 10 mg by mouth daily.   . cycloSPORINE (RESTASIS) 0.05 % ophthalmic emulsion Place 1 drop into both eyes every 12 (twelve) hours.    .Marland Kitchendextromethorphan (DELSYM) 30 MG/5ML liquid Take 15 mg by mouth as needed for cough. 04/22/2017: As needed  . diphenhydrAMINE (BENADRYL) 25 MG tablet Take 25 mg by mouth every 6 (six) hours as needed for allergies. Reported on 08/06/2015 02/04/2016: Liquid, PRN  . famotidine (PEPCID) 20 MG tablet Take 1 tablet (20 mg total) by mouth daily.   . fluticasone (FLONASE) 50 MCG/ACT nasal spray Place 1-2  sprays into both nostrils daily as needed for allergies or rhinitis. 02/04/2016: PRN  . Gluc-Chonn-MSM-Boswellia-Vit D (GLUCOSAMINE CHOND TRIPLE/VIT D PO) Take 1 tablet by mouth 2 (two) times daily.   . hydrOXYzine (ATARAX/VISTARIL) 10 MG tablet Take 10 mg by mouth at bedtime. 1-2 tabs at bedtime   . Lancets (ONETOUCH DELICA PLUS [id number] MISC CHECK BLOOD SUGAR NO MORE THAN TWICE DAILY   . latanoprost (XALATAN) 0.005 % ophthalmic solution Place 1 drop into the left eye at bedtime. Dr. [name]  . Methylcellulose, Laxative, (SOLUBLE FIBER PO) Take by mouth. Great Shape 100% Natural Soluble Fiber   . Multiple Vitamin (MULTIVITAMIN WITH MINERALS) TABS Take 1 tablet by mouth daily with breakfast.   . naproxen sodium (ALEVE) 220 MG tablet Take 220 mg by mouth daily as needed.   .Glory RosebushULTRA test strip CHECK BLOOD SUGAR NO MORE THAN TWICE DAILY   . Polyethyl Glycol-Propyl Glycol (SYSTANE ULTRA) 0.4-0.3 % SOLN Place 1 drop into both eyes daily as needed (dry eyes).    .Marland Kitchenspironolactone (ALDACTONE) 25 MG tablet Take 1 tablet (25 mg total) by mouth daily.   . Travoprost, BAK Free, (TRAVATAN) 0.004 % SOLN ophthalmic solution Place 1 drop into the left eye at bedtime. Dr. [name]  . atorvastatin (LIPITOR) 10 MG tablet Take 1 tablet (10 mg total) by mouth at bedtime. (Patient not taking: Reported on 08/22/2019)   . azelastine (OPTIVAR) 0.05 % ophthalmic solution  Place 1 drop into both eyes 2 (two) times daily.   Marland Kitchen EPINEPHrine (EPIPEN 2-PAK) 0.3 mg/0.3 mL IJ SOAJ injection Inject 0.3 mLs (0.3 mg total) into the muscle once. (Patient not taking: Reported on 03/09/2018) 03/09/2018: PRN  . Olopatadine HCl 0.2 % SOLN Apply 1 drop to eye daily as needed.    No facility-administered encounter medications on file as of 08/30/2019.   Immunization History  Administered Date(s) Administered  . Fluad Quad(high Dose 65+) 03/21/2019  . Influenza Split 05/02/2014  . Influenza Whole 05/14/2007, 04/09/2009, 04/03/2010    . Influenza, High Dose Seasonal PF 04/06/2015, 04/08/2018  . Influenza, Seasonal, Injecte, Preservative Fre 04/12/2013  . Influenza-Unspecified 08/17/2017  . Pneumococcal Conjugate-13 11/30/2013  . Pneumococcal Polysaccharide-23 06/30/2001, 05/14/2007, 09/08/2018  . Td 10/08/2004  . Tdap 04/12/2013  . Zoster 06/30/2005  . Zoster Recombinat (Shingrix) 03/11/2018, 05/20/2018     Current Diagnosis/Assessment:  Goals Addressed            This Visit's Progress   . Check blood pressure 30 minutes to 1 hour after taking blood pressure medications      . Complete a1c at next office visit      . Pharmacy Care Plan       Current Barriers:  . Chronic Disease Management support, education, and care coordination needs related to Pre-DM, HTN, HLD, GERD, Allergic Rhinitis, Pain, Constipation, [place]  Pharmacist Clinical Goal(s):  Marland Kitchen Over the next 90 days, polypharmacy will be reduced as evidenced by simplified regimen for allergy/hives and glaucoma . Obtain medical records from derm, allergy, and ophthalmology specialists . Complete a1c lab at next office visit . Blood pressure <140/90 . A1c <6.5%  Interventions: . Comprehensive medication review performed. Marland Kitchen Purchase blood pressure cuff . Check blood pressure 30 minutes to 1 hour after taking blood pressure medications  Patient Self Care Activities:  . Patient verbalizes understanding of plan to follow as described above, Self administers medications as prescribed, Calls pharmacy for medication refills, and Calls provider office for new concerns or questions  Initial goal documentation     . Purchase blood pressure cuff         Pre-Diabetes   Recent Relevant Labs: Lab Results  Component Value Date/Time   HGBA1C 6.1 03/21/2019 09:10 AM   HGBA1C 6.0 03/09/2018 11:43 AM    Patient is currently controlled on the following medications: None  Last diabetic Eye exam:  Lab Results  Component Value Date/Time   HMDIABEYEEXA No  Retinopathy 02/17/2017 12:00 AM   BG this morning 137 Highest: 150 (since 200 at eye doctor) Lowest: 42 (denies any symptoms of hypoglycemia) Ate sugar free cookies night before  She has been trying to limit potatoes Attempts to do exercises in the home  We discussed: diet and exercise extensively  Plan -Complete a1c lab at next visit -Continue control with diet and exercise   Hypertension   CMP Latest Ref Rng & Units 05/23/2019 03/21/2019 09/08/2018  Glucose 70 - 99 mg/dL 84 96 95  BUN 6 - 23 mg/dL 20 10 14   Creatinine 0.40 - 1.20 mg/dL 1.18 1.02 0.86  Sodium 135 - 145 mEq/L 142 142 142  Potassium 3.5 - 5.1 mEq/L 4.4 4.5 3.8  Chloride 96 - 112 mEq/L 105 104 105  CO2 19 - 32 mEq/L 25 30 28   Calcium 8.4 - 10.5 mg/dL 10.0 10.4 9.5  Total Protein 6.0 - 8.3 g/dL - 6.8 -  Total Bilirubin 0.2 - 1.2 mg/dL - 0.7 -  Alkaline Phos  39 - 117 U/L - 67 -  AST 0 - 37 U/L 20 23 -  ALT 0 - 35 U/L 17 19 -  GFR      53.48   63.30   77.18  BP today is:  Unable to assess due to phone visit  Office blood pressures are  BP Readings from Last 3 Encounters:  03/21/19 134/76  09/08/18 122/70  08/31/18 120/77    Patient has failed these meds in the past: None noted   Patient is currently controlled on the following medications: carvedilol 6.12m BID, spironolactone 256mdaily  Patient checks BP at home Never. Pt does not have BP cuff  We discussed: Proper blood pressure measurement technique and preference for arm BP cuffs vs wrist cuff due to accuracy  Plan -Purchase blood pressure cuff  -Check blood pressure 30 minutes to 1 hour after taking blood pressure meds -Continue current medications     Hyperlipidemia   Lipid Panel     Component Value Date/Time   CHOL 124 05/23/2019 0709   TRIG 105.0 05/23/2019 0709   HDL 44.90 05/23/2019 0709   CHOLHDL 3 05/23/2019 0709   VLDL 21.0 05/23/2019 0709   LDLCALC 58 05/23/2019 0709     ASCVD 10-year risk: Unable to calculate due to LDL  ,70  Patient has failed these meds in past: None noted Patient is currently controlled on the following medications: atorvastatin 1051maily (not taking, pt states [place] stopped her on this 08/04/19 to help narrow down what could be causing her hives)  We discussed:  diet and exercise extensively  Plan -Continue control with diet and exercise   GERD    Patient has failed these meds in past: None noted Patient is currently controlled on the following medications: famotidine 55m22mice daily (indicated more for allergies)  Breakthrough Symptoms: None  Plan -Continue current medications   Allergies/Hives    Patient has failed these meds in past: montelukast (was D/C'd due to famotidine/cetirizine combo) Patient is currently stable, but present on the following medications: famotidine 55mg1m, cetirizine 10mg 30my  Prescribed, but not currently using Mometasone furmarate 0.1% BID PRN (may restart this one for hives) Triamcinolone 0.1% cream  Still having hives, but not using creams Feels hives bet worse in the evening Feels like her whole body breaks out into red spots  We discussed:  How we can reduce polypharmacy with regard to medications for hives and glaucoma  Plan -Continue current medications   Pain    Patient has failed these meds in past: None noted Patient is currently controlled on the following medications: naproxen 255mg P82mPlan -Continue current medications   Constipation    Patient has failed these meds in past: None noted Patient is currently controlled on the following medications: benefiber  Plan -Continue current medications   Glaucoma    Patient has failed these meds in past: None noted Patient is currently controlled on the following medications:  travoprost 1 drop in L eye, latanoprost 1 drop in L eye, restasis 0.05%  We discussed:  duplication of prostaglandin eye drop and that patient should most likely only be on one.  Patient reports she is supposed to use both. Will try to obtain records from ophthalmology to confirm  Plan -Continue current medications, noting she most likely should only be on one prostaglandin eye drop   Miscellaneous Meds Systane PRN Dry Eye Nystatin cream - rash under breast (Dr. Wein) GStann MainlandboGirtha Rmody powder - rash under breast (  Dr. Stann Mainland)

## 2019-09-01 ENCOUNTER — Telehealth: Payer: Self-pay | Admitting: *Deleted

## 2019-09-01 NOTE — Telephone Encounter (Signed)
Chart reviewed- don't see where our office has tried to call.

## 2019-09-01 NOTE — Telephone Encounter (Signed)
Contact Type Call Who Is Calling Patient / Member / Family / Caregiver Caller Name Herald Phone Number [phone] Call Type Message Only Information Provided Reason for Call Returning a Call from the Office Initial Monique [name] states she is returning a call to the office.

## 2019-09-01 NOTE — Patient Instructions (Addendum)
Visit Information  Goals Addressed            This Visit's Progress   . Check blood pressure 30 minutes to 1 hour after taking blood pressure medications      . Complete a1c at next office visit      . Pharmacy Care Plan       Current Barriers:  . Chronic Disease Management support, education, and care coordination needs related to Pre-DM, HTN, HLD, GERD, Allergic Rhinitis, Pain, Constipation, [place]  Pharmacist Clinical Goal(s):  Marland Kitchen Over the next 90 days, polypharmacy will be reduced as evidenced by simplified regimen for allergy/hives and glaucoma . Obtain medical records from derm, allergy, and ophthalmology specialists . Complete a1c lab at next office visit . Blood pressure <140/90 . A1c <6.5%  Interventions: . Comprehensive medication review performed. Marland Kitchen Purchase blood pressure cuff . Check blood pressure 30 minutes to 1 hour after taking blood pressure medications  Patient Self Care Activities:  . Patient verbalizes understanding of plan to follow as described above, Self administers medications as prescribed, Calls pharmacy for medication refills, and Calls provider office for new concerns or questions  Initial goal documentation     . Purchase blood pressure cuff         Monique [name] was given information about Chronic Care Management services today including:  1. CCM service includes personalized support from designated clinical staff supervised by her physician, including individualized plan of care and coordination with other care providers 2. 24/7 contact phone numbers for assistance for urgent and routine care needs. 3. Service will only be billed when office clinical staff spend 20 minutes or more in a month to coordinate care. 4. Only one practitioner may furnish and bill the service in a calendar month. 5. The patient may stop CCM services at any time (effective at the end of the month) by phone call to the office staff.  Patient agreed to services and  verbal consent obtained.   The patient verbalized understanding of instructions provided today and agreed to receive a mailed copy of patient instruction and/or educational materials. The pharmacy team will reach out to the patient again over the next 30 days.    [name], PharmD Clinical Pharmacist [facility] [phone]   Prediabetes Eating Plan Prediabetes is a condition that causes blood sugar (glucose) levels to be higher than normal. This increases the risk for developing diabetes. In order to prevent diabetes from developing, your health care provider may recommend a diet and other lifestyle changes to help you:  Control your blood glucose levels.  Improve your cholesterol levels.  Manage your blood pressure. Your health care provider may recommend working with a diet and nutrition specialist (dietitian) to make a meal plan that is best for you. What are tips for following this plan? Lifestyle  Set weight loss goals with the help of your health care team. It is recommended that most people with prediabetes lose 7% of their current body weight.  Exercise for at least 30 minutes at least 5 days a week.  Attend a support group or seek ongoing support from a mental health counselor.  Take over-the-counter and prescription medicines only as told by your health care provider. Reading food labels  Read food labels to check the amount of fat, salt (sodium), and sugar in prepackaged foods. Avoid foods that have: ? Saturated fats. ? Trans fats. ? Added sugars.  Avoid foods that have more than 300 milligrams (  mg) of sodium per serving. Limit your daily sodium intake to less than 2,300 mg each Monique [name]. Shopping  Avoid buying pre-made and processed foods. Cooking  Cook with olive oil. Do not use butter, lard, or ghee.  Bake, broil, grill, or boil foods. Avoid frying. Meal planning   Work with your dietitian to develop an eating plan that is  right for you. This may include: ? Tracking how many calories you take in. Use a food diary, notebook, or mobile application to track what you eat at each meal. ? Using the glycemic index (GI) to plan your meals. The index tells you how quickly a food will raise your blood glucose. Choose low-GI foods. These foods take a longer time to raise blood glucose.  Consider following a Mediterranean diet. This diet includes: ? Several servings each Monique [name] of fresh fruits and vegetables. ? Eating fish at least twice a week. ? Several servings each Monique [name] of whole grains, beans, nuts, and seeds. ? Using olive oil instead of other fats. ? Moderate alcohol consumption. ? Eating small amounts of red meat and whole-fat dairy.  If you have high blood pressure, you may need to limit your sodium intake or follow a diet such as the DASH eating plan. DASH is an eating plan that aims to lower high blood pressure. What foods are recommended? The items listed below may not be a complete list. Talk with your dietitian about what dietary choices are best for you. Grains Whole grains, such as whole-wheat or whole-grain breads, crackers, cereals, and pasta. Unsweetened oatmeal. Bulgur. Barley. Quinoa. Brown rice. Corn or whole-wheat flour tortillas or taco shells. Vegetables Lettuce. Spinach. Peas. Beets. Cauliflower. Cabbage. Broccoli. Carrots. Tomatoes. Squash. Eggplant. Herbs. Peppers. Onions. Cucumbers. Brussels sprouts. Fruits Berries. Bananas. Apples. Oranges. Grapes. Papaya. Mango. Pomegranate. Kiwi. Grapefruit. Cherries. Meats and other protein foods Seafood. Poultry without skin. Lean cuts of pork and beef. Tofu. Eggs. Nuts. Beans. Dairy Low-fat or fat-free dairy products, such as yogurt, cottage cheese, and cheese. Beverages Water. Tea. Coffee. Sugar-free or diet soda. Seltzer water. Lowfat or no-fat milk. Milk alternatives, such as soy or almond milk. Fats and oils Olive oil. Canola oil. Sunflower oil.  Grapeseed oil. Avocado. Walnuts. Sweets and desserts Sugar-free or low-fat pudding. Sugar-free or low-fat ice cream and other frozen treats. Seasoning and other foods Herbs. Sodium-free spices. Mustard. Relish. Low-fat, low-sugar ketchup. Low-fat, low-sugar barbecue sauce. Low-fat or fat-free mayonnaise. What foods are not recommended? The items listed below may not be a complete list. Talk with your dietitian about what dietary choices are best for you. Grains Refined white flour and flour products, such as bread, pasta, snack foods, and cereals. Vegetables Canned vegetables. Frozen vegetables with butter or cream sauce. Fruits Fruits canned with syrup. Meats and other protein foods Fatty cuts of meat. Poultry with skin. Breaded or fried meat. Processed meats. Dairy Full-fat yogurt, cheese, or milk. Beverages Sweetened drinks, such as sweet iced tea and soda. Fats and oils Butter. Lard. Ghee. Sweets and desserts Baked goods, such as cake, cupcakes, pastries, cookies, and cheesecake. Seasoning and other foods Spice mixes with added salt. Ketchup. Barbecue sauce. Mayonnaise. Summary  To prevent diabetes from developing, you may need to make diet and other lifestyle changes to help control blood sugar, improve cholesterol levels, and manage your blood pressure.  Set weight loss goals with the help of your health care team. It is recommended that most people with prediabetes lose 7 percent of their current body weight.  Consider following  a Mediterranean diet that includes plenty of fresh fruits and vegetables, whole grains, beans, nuts, seeds, fish, lean meat, low-fat dairy, and healthy oils. This information is not intended to replace advice given to you by your health care provider. Make sure you discuss any questions you have with your health care provider. Document Revised: 10/08/2018 Document Reviewed: 08/20/2016 Elsevier Patient Education  [place].   Blood Pressure  Record Sheet To take your blood pressure, you will need a blood pressure machine. You can buy a blood pressure machine (blood pressure monitor) at your clinic, drug store, or online. When choosing one, consider:  An automatic monitor that has an arm cuff.  A cuff that wraps snugly around your upper arm. You should be able to fit only one finger between your arm and the cuff.  A device that stores blood pressure reading results.  Do not choose a monitor that measures your blood pressure from your wrist or finger. Follow your health care provider's instructions for how to take your blood pressure. To use this form:  Get one reading in the morning (a.m.) before you take any medicines.  Get one reading in the evening (p.m.) before supper.  Take at least 2 readings with each blood pressure check. This makes sure the results are correct. Wait 1-2 minutes between measurements.  Write down the results in the spaces on this form.  Repeat this once a week, or as told by your health care provider.  Make a follow-up appointment with your health care provider to discuss the results. Blood pressure log Date: _______________________  a.m. _____________________(1st reading) _____________________(2nd reading)  p.m. _____________________(1st reading) _____________________(2nd reading) Date: _______________________  a.m. _____________________(1st reading) _____________________(2nd reading)  p.m. _____________________(1st reading) _____________________(2nd reading) Date: _______________________  a.m. _____________________(1st reading) _____________________(2nd reading)  p.m. _____________________(1st reading) _____________________(2nd reading) Date: _______________________  a.m. _____________________(1st reading) _____________________(2nd reading)  p.m. _____________________(1st reading) _____________________(2nd reading) Date: _______________________  a.m. _____________________(1st  reading) _____________________(2nd reading)  p.m. _____________________(1st reading) _____________________(2nd reading) This information is not intended to replace advice given to you by your health care provider. Make sure you discuss any questions you have with your health care provider. Document Revised: 08/14/2017 Document Reviewed: 06/16/2017 Elsevier Patient Education  [place].   How to Take Your Blood Pressure You can take your blood pressure at home with a machine. You may need to check your blood pressure at home:  To check if you have high blood pressure (hypertension).  To check your blood pressure over time.  To make sure your blood pressure medicine is working. Supplies needed: You will need a blood pressure machine, or monitor. You can buy one at a drugstore or online. When choosing one:  Choose one with an arm cuff.  Choose one that wraps around your upper arm. Only one finger should fit between your arm and the cuff.  Do not choose one that measures your blood pressure from your wrist or finger. Your doctor can suggest a monitor. How to prepare Avoid these things for 30 minutes before checking your blood pressure:  Drinking caffeine.  Drinking alcohol.  Eating.  Smoking.  Exercising. Five minutes before checking your blood pressure:  Pee.  Sit in a dining chair. Avoid sitting in a soft couch or armchair.  Be quiet. Do not talk. How to take your blood pressure Follow the instructions that came with your machine. If you have a digital blood pressure monitor, these may be the instructions: 1. Sit up straight. 2. Place your  feet on the floor. Do not cross your ankles or legs. 3. Rest your left arm at the level of your heart. You may rest it on a table, desk, or chair. 4. Pull up your shirt sleeve. 5. Wrap the blood pressure cuff around the upper part of your left arm. The cuff should be 1 inch (2.5 cm) above your elbow. It is best to wrap the  cuff around bare skin. 6. Fit the cuff snugly around your arm. You should be able to place only one finger between the cuff and your arm. 7. Put the cord inside the groove of your elbow. 8. Press the power button. 9. Sit quietly while the cuff fills with air and loses air. 10. Write down the numbers on the screen. 11. Wait 2-3 minutes and then repeat steps 1-10. What do the numbers mean? Two numbers make up your blood pressure. The first number is called systolic pressure. The second is called diastolic pressure. An example of a blood pressure reading is "120 over 80" (or 120/80). If you are an adult and do not have a medical condition, use this guide to find out if your blood pressure is normal: Normal  First number: below 120.  Second number: below 80. Elevated  First number: 120-129.  Second number: below 80. Hypertension stage 1  First number: 130-139.  Second number: 80-89. Hypertension stage 2  First number: 140 or above.  Second number: 87 or above. Your blood pressure is above normal even if only the top or bottom number is above normal. Follow these instructions at home:  Check your blood pressure as often as your doctor tells you to.  Take your monitor to your next doctor's appointment. Your doctor will: ? Make sure you are using it correctly. ? Make sure it is working right.  Make sure you understand what your blood pressure numbers should be.  Tell your doctor if your medicines are causing side effects. Contact a doctor if:  Your blood pressure keeps being high. Get help right away if:  Your first blood pressure number is higher than 180.  Your second blood pressure number is higher than 120. This information is not intended to replace advice given to you by your health care provider. Make sure you discuss any questions you have with your health care provider. Document Revised: 05/29/2017 Document Reviewed: 11/23/2015 Elsevier Patient Education  2020  [employer].

## 2019-09-06 NOTE — Progress Notes (Signed)
Patient called me to discuss her efforts in obtaining records for our office from her dermatologist (Dr. Renda Rolls), allergist (Dr. Donneta Romberg), and optometrist (Dr. [name]).   [place] [phone]) - last visit 08/04/19 - should be faxing records to [place] per patient Donneta Romberg [phone]) - last visit 07/22/19 - pt has to go into their office to sign release of records form before records can be sent to our office per patient [place] ([phone]) - last visit 08/05/19 - should receive fax on 09/07/19 (patient called and spoke to [place]. I also called and requested fax and was told it would be sent over in the AM on 09/07/19)  Dr. Irven Shelling office only has latanoprost and brimonidine on file as eye drops for the patient. Was informed that I should contact Dr. Talbert Forest' office [phone]) to clarify which prostaglandin eye drop patient should take (patient currently taking travoprost and latanoprost).   Called patient back and informed her she should only take one of the prostaglandin eye drops until I reach Dr. Talbert Forest for clarification of which one she should take. Left VM at office of Dr. Talbert Forest asking for a call back at [phone]).

## 2019-09-16 ENCOUNTER — Ambulatory Visit: Payer: PPO | Attending: Internal Medicine

## 2019-09-16 DIAGNOSIS — Z23 Encounter for immunization: Secondary | ICD-10-CM

## 2019-09-16 NOTE — Progress Notes (Signed)
   Covid-19 Vaccination Clinic  Name:  Monique [name]    MRN: [id number] DOB: 01/21/1941  09/16/2019  [name] was observed post Covid-19 immunization for 30 minutes based on pre-vaccination screening without incident. She was provided with Vaccine Information Sheet and instruction to access the V-Safe system.   [name] was instructed to call 911 with any severe reactions post vaccine: Marland Kitchen Difficulty breathing  . Swelling of face and throat  . A fast heartbeat  . A bad rash all over body  . Dizziness and weakness   Immunizations Administered    Name Date Dose VIS Date Route   Pfizer COVID-19 Vaccine 09/16/2019  9:00 AM 0.3 mL 06/10/2019 Intramuscular   Manufacturer: [place]   Lot: [id number]   [place]: [id number]

## 2019-10-11 ENCOUNTER — Ambulatory Visit: Payer: PPO | Attending: Internal Medicine

## 2019-10-11 DIAGNOSIS — Z23 Encounter for immunization: Secondary | ICD-10-CM

## 2019-10-11 NOTE — Progress Notes (Signed)
   Covid-19 Vaccination Clinic  Name:  Monique [name]    MRN: [id number] DOB: 01-24-1941  10/11/2019  [name] was observed post Covid-19 immunization for 30 minutes based on pre-vaccination screening without incident. She was provided with Vaccine Information Sheet and instruction to access the V-Safe system.   [name] was instructed to call 911 with any severe reactions post vaccine: Marland Kitchen Difficulty breathing  . Swelling of face and throat  . A fast heartbeat  . A bad rash all over body  . Dizziness and weakness   Immunizations Administered    Name Date Dose VIS Date Route   Pfizer COVID-19 Vaccine 10/11/2019  9:23 AM 0.3 mL 06/10/2019 Intramuscular   Manufacturer: [place]   Lot: [id number]   [place]: [id number]

## 2019-10-12 ENCOUNTER — Other Ambulatory Visit: Payer: Self-pay

## 2019-10-12 ENCOUNTER — Ambulatory Visit (INDEPENDENT_AMBULATORY_CARE_PROVIDER_SITE_OTHER): Payer: PPO | Admitting: Internal Medicine

## 2019-10-12 ENCOUNTER — Encounter: Payer: Self-pay | Admitting: Internal Medicine

## 2019-10-12 ENCOUNTER — Ambulatory Visit: Payer: Self-pay | Admitting: Pharmacist

## 2019-10-12 VITALS — BP 121/71 | HR 92 | Temp 96.5°F | Resp 18 | Ht 61.0 in | Wt 159.5 lb

## 2019-10-12 DIAGNOSIS — L03116 Cellulitis of left lower limb: Secondary | ICD-10-CM | POA: Diagnosis not present

## 2019-10-12 DIAGNOSIS — M79604 Pain in right leg: Secondary | ICD-10-CM

## 2019-10-12 DIAGNOSIS — M79605 Pain in left leg: Secondary | ICD-10-CM

## 2019-10-12 DIAGNOSIS — E785 Hyperlipidemia, unspecified: Secondary | ICD-10-CM | POA: Diagnosis not present

## 2019-10-12 MED ORDER — CLINDAMYCIN HCL 300 MG PO CAPS: 300.0000 mg | ORAL_CAPSULE | Freq: Four times a day (QID) | ORAL | 0 refills | Status: DC

## 2019-10-12 NOTE — Progress Notes (Signed)
Subjective:    Patient ID: Monique [name], female    DOB: 1940-08-12, 79 y.o.   MRN: [id number]  DOS:  10/12/2019 Type of visit - description: Acute Had her first [place] Covid vaccination on 09/16/2019 and the second injection  yesterday.  She is here because after the first shot she developed lower extremities pain, apparently generalized but worse behind the left knee.  Also has developed a lump at the inner side of the left thigh.  The description of the symptoms is really vague.   Review of Systems Denies fevers. No generalized itching or pruritus. No recent rash Last month had some dizziness and lightheadedness for a short period of time, better with OTC Dramamine.  No further symptoms.   Past Medical History:  Diagnosis Date  . Allergy   . Anemia    when had uterine fibroids  . Anxiety   . Cataract    very small  . Cholelithiasis   . Closed fracture of base of fifth metacarpal bone of left hand 06/01/2018  . DJD (degenerative joint disease)   . Dry eye syndrome   . Headache(784.0)   . Hyperlipidemia 01/20/2011  . Hypertension 09/29/2006  . Substance abuse ([place])   . Thrombocytopenia ([place]) 08/27/2011   hematology evaluation 08-2011, return prn  . Urticaria   . Vasomotor rhinitis     Past Surgical History:  Procedure Laterality Date  . ABDOMINAL HYSTERECTOMY     no oophorectomy  . APPENDECTOMY    . CHOLECYSTECTOMY N/A 06/06/2016   Procedure: LAPAROSCOPIC CHOLECYSTECTOMY WITH INTRAOPERATIVE CHOLANGIOGRAM;  Surgeon: Johnathan Hausen, MD;  Location: [facility];  Service: General;  Laterality: N/A;  . DILATION AND CURETTAGE OF UTERUS    . EYE SURGERY Bilateral 05/06/2019  . HERNIA REPAIR    . mole removals    . TONSILLECTOMY    . UMBILICAL HERNIA REPAIR      Allergies as of 10/12/2019      Reactions   Bee Venom Anaphylaxis   Doxycycline Hives, Itching   Penicillins Itching, Nausea And Vomiting, Swelling   syncope   Sertraline Other (See Comments)   Dry Mouth   Azithromycin Itching, Rash   Cerumenex [trolamine] Itching, Rash   Climara [estradiol] Itching, Rash   Nickel Rash      Medication List       Accurate as of October 12, 2019 11:59 PM. If you have any questions, ask your nurse or doctor.        STOP taking these medications   atorvastatin 10 MG tablet Commonly known as: LIPITOR Stopped by: Kathlene November, MD   hydrOXYzine 10 MG tablet Commonly known as: ATARAX/VISTARIL Stopped by: Kathlene November, MD   Olopatadine HCl 0.2 % Soln Stopped by: Kathlene November, MD     TAKE these medications   aspirin 81 MG tablet Take 81 mg by mouth daily with breakfast.   azelastine 0.05 % ophthalmic solution Commonly known as: OPTIVAR Place 1 drop into both eyes 2 (two) times daily.   blood glucose meter kit and supplies Dispense based on patient and insurance preference. Check blood sugars no more than twice daily   brimonidine 0.2 % ophthalmic solution Commonly known as: ALPHAGAN Place into both eyes 3 (three) times daily. Dr. [name]   CALCIUM + D PO Take 2 tablets by mouth daily with lunch. Calcium 655m  Vitamin D 800 Units   carvedilol 6.25 MG tablet Commonly known as: COREG Take 1 tablet (6.25 mg total) by mouth 2 (two) times  daily with a meal.   cetirizine 10 MG tablet Commonly known as: ZYRTEC Take 10 mg by mouth daily.   clindamycin 300 MG capsule Commonly known as: CLEOCIN Take 1 capsule (300 mg total) by mouth 4 (four) times daily. Started by: Kathlene November, MD   cycloSPORINE 0.05 % ophthalmic emulsion Commonly known as: RESTASIS Place 1 drop into both eyes every 12 (twelve) hours.   Delsym 30 MG/5ML liquid Generic drug: dextromethorphan Take 15 mg by mouth as needed for cough.   diphenhydrAMINE 25 MG tablet Commonly known as: BENADRYL Take 25 mg by mouth every 6 (six) hours as needed for allergies. Reported on 08/06/2015   EPINEPHrine 0.3 mg/0.3 mL Soaj injection Commonly known as: EpiPen 2-Pak Inject 0.3 mLs (0.3 mg total) into  the muscle once.   famotidine 20 MG tablet Commonly known as: PEPCID Take 1 tablet (20 mg total) by mouth daily.   fluticasone 50 MCG/ACT nasal spray Commonly known as: FLONASE Place 1-2 sprays into both nostrils daily as needed for allergies or rhinitis.   GLUCOSAMINE CHOND TRIPLE/VIT D PO Take 1 tablet by mouth 2 (two) times daily.   latanoprost 0.005 % ophthalmic solution Commonly known as: XALATAN Place 1 drop into the left eye at bedtime. Dr. [name]   multivitamin with minerals Tabs tablet Take 1 tablet by mouth daily with breakfast.   naproxen sodium 220 MG tablet Commonly known as: ALEVE Take 220 mg by mouth daily as needed.   OneTouch Delica Plus [id number] Misc CHECK BLOOD SUGAR NO MORE THAN TWICE DAILY   OneTouch Ultra test strip Generic drug: glucose blood CHECK BLOOD SUGAR NO MORE THAN TWICE DAILY   SOLUBLE FIBER PO Take by mouth. Great Shape 100% Natural Soluble Fiber   spironolactone 25 MG tablet Commonly known as: ALDACTONE Take 1 tablet (25 mg total) by mouth daily.   Systane Ultra 0.4-0.3 % Soln Generic drug: Polyethyl Glycol-Propyl Glycol Place 1 drop into both eyes daily as needed (dry eyes).   Travoprost (BAK Free) 0.004 % Soln ophthalmic solution Commonly known as: TRAVATAN Place 1 drop into the left eye at bedtime. Dr. [name]          Objective:   Physical Exam Musculoskeletal:       Legs:    BP 121/71 (BP Location: Left Arm, Patient Position: Sitting, Cuff Size: Normal)   Pulse 92   Temp (!) 96.5 F (35.8 C) (Temporal)   Resp 18   Ht 5' 1"  (1.549 m)   Wt 159 lb 8 oz (72.3 kg)   SpO2 97%   BMI 30.14 kg/m  General:   Well developed, NAD, BMI noted.  HEENT:  Normocephalic . Face symmetric, atraumatic Lungs:  CTA B Normal respiratory effort, no intercostal retractions, no accessory muscle use. Heart: RRR,  no murmur.  Abdomen:  Not distended, soft, non-tender. No rebound or rigidity.   Groins: No  lymphadenopathies,  good femoral pulses Skin: See graphic Lower extremities: no pretibial edema bilaterally.  Calves symmetric, no TTP.  On inspection and palpation there is no phlebitis Neurologic:  alert & oriented X3.  Speech normal, gait appropriate for age and unassisted Psych--  Cognition and judgment appear intact.  Cooperative with normal attention span and concentration.  Behavior appropriate. No anxious or depressed appearing.     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  Symptoms started after the first Covid vaccination, unclear if there is a relationship. For completeness we will check BMP, CBC and total CK Cellulitis: Redness on the left inner thigh  according to the patient started after the Covid vaccination, on clinical exam she has most likely cellulitis, doubt related to Covid vaccination.  No history of a tick bite or injury. Rx clindamycin, call if no better. High cholesterol: Recently we prescribed Lipitor, great results however she stopped it under the advice of her dermatologist (per patient) Poor historian: The patient describes her symptoms very vaguely, not a new issue but seems worse today.She just lost her mother yesterday so she is likely very stressed, monitor the issue over time, mild dementia?. Follow-up already scheduled for May 2021.     This visit occurred during the SARS-CoV-2 public health emergency.  Safety protocols were in place, including screening questions prior to  the visit, additional usage of staff PPE, and extensive cleaning of exam room while observing appropriate contact time as indicated for disinfecting solutions.

## 2019-10-12 NOTE — Chronic Care Management (AMB) (Signed)
Patient called initially to inform me that she purchased a blood pressure cuff and feels her readings are higher than normal. She only reported one reading of 146/76, then asked me about the signs of a blood clot.   She states her lower leg (at her knee and below) has pain, is red, and warm to the touch. She states she has felt this way since her first COVID vaccine on 09/16/19. She denies any shortness of breath or chest pain. Consulted with Dr. Larose Kells who states that patient can be seen in office today.   Got patient scheduled for an appt today (10/12/19) at 3:20pm with Dr. Larose Kells. Communicated this with the patient. She verbalized understanding and agreed to come in today for evaluation.

## 2019-10-12 NOTE — Patient Instructions (Addendum)
Take the antibiotics as prescribed  For aches and pains, take Tylenol as needed  If the redness is not gradually better please let me know   GO TO THE LAB : Get the blood work    Keep the appointment you have with me for May, sooner if you are not getting better.

## 2019-10-12 NOTE — Progress Notes (Signed)
Pre visit review using our clinic review tool, if applicable. No additional management support is needed unless otherwise documented below in the visit note. 

## 2019-10-13 ENCOUNTER — Telehealth: Payer: Self-pay | Admitting: Internal Medicine

## 2019-10-13 LAB — BASIC METABOLIC PANEL
BUN: 12 mg/dL (ref 6–23)
CO2: 26 mEq/L (ref 19–32)
Calcium: 9.8 mg/dL (ref 8.4–10.5)
Chloride: 104 mEq/L (ref 96–112)
Creatinine, Ser: 1.04 mg/dL (ref 0.40–1.20)
GFR: 61.81 mL/min (ref >60.00)
Glucose, Bld: 94 mg/dL (ref 70–99)
Potassium: 3.9 mEq/L (ref 3.5–5.1)
Sodium: 139 mEq/L (ref 135–145)

## 2019-10-13 LAB — CBC WITH DIFFERENTIAL/PLATELET
Basophils Absolute: 0 10*3/uL (ref 0.0–0.1)
Basophils Relative: 0.6 % (ref 0.0–3.0)
Eosinophils Absolute: 0.2 10*3/uL (ref 0.0–0.7)
Eosinophils Relative: 2.3 % (ref 0.0–5.0)
HCT: 39.4 % (ref 36.0–46.0)
Hemoglobin: 12.7 g/dL (ref 12.0–15.0)
Lymphocytes Relative: 21.9 % (ref 12.0–46.0)
Lymphs Abs: 1.5 10*3/uL (ref 0.7–4.0)
MCHC: 32.3 g/dL (ref 30.0–36.0)
MCV: 71.2 fl — ABNORMAL LOW (ref 78.0–100.0)
Monocytes Absolute: 0.3 10*3/uL (ref 0.1–1.0)
Monocytes Relative: 4.7 % (ref 3.0–12.0)
Neutro Abs: 4.8 10*3/uL (ref 1.4–7.7)
Neutrophils Relative %: 70.5 % (ref 43.0–77.0)
Platelets: 134 10*3/uL — ABNORMAL LOW (ref 150.0–400.0)
RBC: 5.53 Mil/uL — ABNORMAL HIGH (ref 3.87–5.11)
RDW: 14.5 % (ref 11.5–15.5)
WBC: 6.8 10*3/uL (ref 4.0–10.5)

## 2019-10-13 LAB — CK: Total CK: 151 U/L (ref 7–177)

## 2019-10-13 NOTE — Telephone Encounter (Signed)
Caller : Alyaa Rigdon   Call Back # [phone]  Subject: clindamycin (CLEOCIN) 300 MG capsule [id number]    Patient feels that medication is to strong and would like reduce usage. Patient states that she has diarrhea.

## 2019-10-13 NOTE — Telephone Encounter (Signed)
Please advise 

## 2019-10-13 NOTE — Telephone Encounter (Signed)
Spoke w/ Pt- informed recommendations. Pt verbalized understanding.  °

## 2019-10-13 NOTE — Telephone Encounter (Signed)
(  Patient has several antibiotic allergies, options are limited, consider Bactrim). Advise patient: Okay to decrease to 3 times a day. Call if diarrhea persist. Also recommend OTC probiotic.

## 2019-10-14 NOTE — Assessment & Plan Note (Signed)
Bilateral leg pain: sxs not consistent with PVD, on clinical grounds no phlebitis or DVT.  Symptoms started after the first Covid vaccination, unclear if there is a relationship. For completeness we will check BMP, CBC and total CK Cellulitis: Redness on the left inner thigh  according to the patient started after the Covid vaccination, on clinical exam she has most likely cellulitis, doubt related to Covid vaccination.  No history of a tick bite or injury. Rx clindamycin, call if no better. High cholesterol: Recently we prescribed Lipitor, great results however she stopped it under the advice of her dermatologist (per patient) Poor historian: The patient describes her symptoms very vaguely, not a new issue but seems worse today.She just lost her mother yesterday so she is likely very stressed, monitor the issue over time, mild dementia?. Follow-up already scheduled for May 2021.

## 2019-10-19 ENCOUNTER — Other Ambulatory Visit: Payer: Self-pay

## 2019-10-20 ENCOUNTER — Ambulatory Visit (INDEPENDENT_AMBULATORY_CARE_PROVIDER_SITE_OTHER): Payer: PPO | Admitting: Internal Medicine

## 2019-10-20 ENCOUNTER — Encounter: Payer: Self-pay | Admitting: Internal Medicine

## 2019-10-20 VITALS — BP 129/69 | HR 81 | Temp 97.4°F | Resp 18 | Ht 61.0 in | Wt 163.2 lb

## 2019-10-20 DIAGNOSIS — M79605 Pain in left leg: Secondary | ICD-10-CM

## 2019-10-20 DIAGNOSIS — L03116 Cellulitis of left lower limb: Secondary | ICD-10-CM

## 2019-10-20 DIAGNOSIS — M79604 Pain in right leg: Secondary | ICD-10-CM

## 2019-10-20 DIAGNOSIS — L509 Urticaria, unspecified: Secondary | ICD-10-CM | POA: Diagnosis not present

## 2019-10-20 MED ORDER — MONTELUKAST SODIUM 10 MG PO TABS: 10.0000 mg | ORAL_TABLET | Freq: Every day | ORAL | 3 refills | Status: DC

## 2019-10-20 MED ORDER — PREDNISONE 10 MG PO TABS: ORAL_TABLET | ORAL | 0 refills | Status: DC

## 2019-10-20 NOTE — Progress Notes (Signed)
Subjective:    Patient ID: Monique [name], female    DOB: 1940/08/23, 79 y.o.   MRN: [id number]  DOS:  10/20/2019 Type of visit - description: Acute Few days ago, started with recurrence of urticaria. Multiple red spots and generalized pruritus.  Recently was seen with leg pain: Symptoms better Also had cellulitis, prescribed clindamycin, cellulitis area has improved significantly.  Review of Systems Denies fever chills No difficulty breathing, cough or wheezing No tongue or lip swelling  Past Medical History:  Diagnosis Date  . Allergy   . Anemia    when had uterine fibroids  . Anxiety   . Cataract    very small  . Cholelithiasis   . Closed fracture of base of fifth metacarpal bone of left hand 06/01/2018  . DJD (degenerative joint disease)   . Dry eye syndrome   . Headache(784.0)   . Hyperlipidemia 01/20/2011  . Hypertension 09/29/2006  . Substance abuse ([place])   . Thrombocytopenia ([place]) 08/27/2011   hematology evaluation 08-2011, return prn  . Urticaria   . Vasomotor rhinitis     Past Surgical History:  Procedure Laterality Date  . ABDOMINAL HYSTERECTOMY     no oophorectomy  . APPENDECTOMY    . CHOLECYSTECTOMY N/A 06/06/2016   Procedure: LAPAROSCOPIC CHOLECYSTECTOMY WITH INTRAOPERATIVE CHOLANGIOGRAM;  Surgeon: Johnathan Hausen, MD;  Location: [facility];  Service: General;  Laterality: N/A;  . DILATION AND CURETTAGE OF UTERUS    . EYE SURGERY Bilateral 05/06/2019  . HERNIA REPAIR    . mole removals    . TONSILLECTOMY    . UMBILICAL HERNIA REPAIR      Allergies as of 10/20/2019      Reactions   Bee Venom Anaphylaxis   Doxycycline Hives, Itching   Penicillins Itching, Nausea And Vomiting, Swelling   syncope   Sertraline Other (See Comments)   Dry Mouth   Azithromycin Itching, Rash   Cerumenex [trolamine] Itching, Rash   Climara [estradiol] Itching, Rash   Nickel Rash      Medication List       Accurate as of October 20, 2019  3:57 PM. If you have any  questions, ask your nurse or doctor.        acetaminophen 500 MG tablet Commonly known as: TYLENOL Take 500-1,000 mg by mouth daily as needed.   aspirin 81 MG tablet Take 81 mg by mouth daily with breakfast.   azelastine 0.05 % ophthalmic solution Commonly known as: OPTIVAR Place 1 drop into both eyes 2 (two) times daily.   blood glucose meter kit and supplies Dispense based on patient and insurance preference. Check blood sugars no more than twice daily   brimonidine 0.2 % ophthalmic solution Commonly known as: ALPHAGAN Place into both eyes 3 (three) times daily. Dr. [name]   CALCIUM + D PO Take 2 tablets by mouth daily with lunch. Calcium 615m  Vitamin D 800 Units   carvedilol 6.25 MG tablet Commonly known as: COREG Take 1 tablet (6.25 mg total) by mouth 2 (two) times daily with a meal.   cetirizine 10 MG tablet Commonly known as: ZYRTEC Take 10 mg by mouth daily.   clindamycin 300 MG capsule Commonly known as: CLEOCIN Take 1 capsule (300 mg total) by mouth 4 (four) times daily. What changed: when to take this   cycloSPORINE 0.05 % ophthalmic emulsion Commonly known as: RESTASIS Place 1 drop into both eyes every 12 (twelve) hours.   Delsym 30 MG/5ML liquid Generic drug: dextromethorphan Take 15  mg by mouth as needed for cough.   diphenhydrAMINE 25 MG tablet Commonly known as: BENADRYL Take 25 mg by mouth every 6 (six) hours as needed for allergies. Reported on 08/06/2015   EPINEPHrine 0.3 mg/0.3 mL Soaj injection Commonly known as: EpiPen 2-Pak Inject 0.3 mLs (0.3 mg total) into the muscle once.   famotidine 20 MG tablet Commonly known as: PEPCID Take 1 tablet (20 mg total) by mouth daily.   fluticasone 50 MCG/ACT nasal spray Commonly known as: FLONASE Place 1-2 sprays into both nostrils daily as needed for allergies or rhinitis.   GLUCOSAMINE CHOND TRIPLE/VIT D PO Take 1 tablet by mouth 2 (two) times daily.   latanoprost 0.005 % ophthalmic  solution Commonly known as: XALATAN Place 1 drop into the left eye at bedtime. Dr. [name]   multivitamin with minerals Tabs tablet Take 1 tablet by mouth daily with breakfast.   naproxen sodium 220 MG tablet Commonly known as: ALEVE Take 220 mg by mouth daily as needed.   OneTouch Delica Plus [id number] Misc CHECK BLOOD SUGAR NO MORE THAN TWICE DAILY   OneTouch Ultra test strip Generic drug: glucose blood CHECK BLOOD SUGAR NO MORE THAN TWICE DAILY   SOLUBLE FIBER PO Take by mouth. Great Shape 100% Natural Soluble Fiber   spironolactone 25 MG tablet Commonly known as: ALDACTONE Take 1 tablet (25 mg total) by mouth daily.   Systane Ultra 0.4-0.3 % Soln Generic drug: Polyethyl Glycol-Propyl Glycol Place 1 drop into both eyes daily as needed (dry eyes).   Travoprost (BAK Free) 0.004 % Soln ophthalmic solution Commonly known as: TRAVATAN Place 1 drop into the left eye at bedtime. Dr. [name]          Objective:   Physical Exam BP 129/69 (BP Location: Left Arm, Patient Position: Sitting, Cuff Size: Small)   Pulse 81   Temp (!) 97.4 F (36.3 C) (Temporal)   Resp 18   Ht _0  (1.549 m)   Wt 163 lb 4 oz (74 kg)   SpO2 98%   BMI 30.85 kg/m  General:   Well developed, NAD, BMI noted. HEENT:  Normocephalic . Face symmetric, atraumatic. Tongue and lips: Normal Lungs:  CTA B Normal respiratory effort, no intercostal retractions, no accessory muscle use. Heart: RRR,  no murmur.  Lower extremities: no pretibial edema bilaterally  Skin:  Multiple, 2 to 3 cm area round areas of macular redness, mostly on the upper extremity, some of them back and few at the lower extremities. Previously seen cellulitis at the left leg  essentially resolved. Neurologic:  alert & oriented X3.  Speech normal, gait appropriate for age and unassisted Psych--  Cognition and judgment appear intact.  Cooperative with normal attention span and concentration.  Behavior appropriate. No  anxious or depressed appearing.      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 Cellulitis: See last visit, was prescribed clindamycin, improved.  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 Has seen the allergist before, Dr. Donneta Romberg, refer back to him. Continue Zyrtec, Benadryl as needed, restart Singulair.  Continue  Pepcid.  Low-dose prednisone, see AVS Poor historian: See last visit, she seems to be back to baseline. Follow-up as scheduled for May 2021    This visit occurred during the SARS-CoV-2 public health emergency.  Safety protocols were in place, including screening questions prior to the visit, additional usage of staff PPE, and extensive cleaning of exam room while observing appropriate contact time as indicated for disinfecting solutions.

## 2019-10-20 NOTE — Progress Notes (Signed)
Pre visit review using our clinic review tool, if applicable. No additional management support is needed unless otherwise documented below in the visit note. 

## 2019-10-20 NOTE — Patient Instructions (Addendum)
Stop clindamycin  Be sure you take Zyrtec daily  You can also take Benadryl twice a day if the itching is severe  Go back on Singulair, a prescription is sent.  Take prednisone for few days  Be sure you have an EpiPen available in case you have severe allergy reaction, difficulty breathing, lip or tongue swelling.   We are sending back to Dr. Donneta Romberg

## 2019-10-22 NOTE — Assessment & Plan Note (Signed)
Bilateral leg pain: See last visit, labs negative, getting better. Cellulitis: See last visit, was prescribed clindamycin, improved.  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 Has seen the allergist before, Dr. Donneta Romberg, refer back to him. Continue Zyrtec, Benadryl as needed, restart Singulair.  Continue Pepcid.  Low-dose prednisone, see AVS Poor historian: See last visit, she seems to be back to baseline. Follow-up as scheduled for May 2021

## 2019-11-11 DIAGNOSIS — J300 Vasomotor rhinitis: Secondary | ICD-10-CM | POA: Diagnosis not present

## 2019-11-11 DIAGNOSIS — L509 Urticaria, unspecified: Secondary | ICD-10-CM | POA: Diagnosis not present

## 2019-11-18 ENCOUNTER — Other Ambulatory Visit: Payer: Self-pay

## 2019-11-18 ENCOUNTER — Encounter: Payer: Self-pay | Admitting: Internal Medicine

## 2019-11-18 ENCOUNTER — Ambulatory Visit (INDEPENDENT_AMBULATORY_CARE_PROVIDER_SITE_OTHER): Payer: PPO | Admitting: Internal Medicine

## 2019-11-18 VITALS — BP 133/72 | HR 79 | Temp 97.5°F | Resp 18 | Ht 61.0 in | Wt 158.1 lb

## 2019-11-18 DIAGNOSIS — L509 Urticaria, unspecified: Secondary | ICD-10-CM | POA: Diagnosis not present

## 2019-11-18 DIAGNOSIS — I10 Essential (primary) hypertension: Secondary | ICD-10-CM

## 2019-11-18 DIAGNOSIS — E785 Hyperlipidemia, unspecified: Secondary | ICD-10-CM

## 2019-11-18 DIAGNOSIS — R739 Hyperglycemia, unspecified: Secondary | ICD-10-CM

## 2019-11-18 LAB — HEMOGLOBIN A1C: Hgb A1c MFr Bld: 5.8 % (ref 4.6–6.5)

## 2019-11-18 NOTE — Assessment & Plan Note (Signed)
Prediabetes: Check A1c HTN: Continue carvedilol, Aldactone, last BMP satisfactory. Hyperlipidemia: Was Rx atorvastatin 03-2019, cholesterol definitely improved but atorvastatin DC'd by dermatology around 07-2019 as the patient was having hives.  Subsequently she improved, I am not 100% sure hives were due to statins. For now recommend a healthy diet and recheck on RTC Aspirin: Okay to stop due to new guidelines Urticaria: Since the last visit she is doing better, saw Dr. Donneta Romberg, no changes made.  See comments on hyperlipidemia. Anxiety, depression: Seems to be doing great, on no medications Leg pain: Still there, achy around the knees mostly when she sits down for too long and tries to walk.  Recommend observation  Social: Lives with herself, still drives, completely independent. RTC 4 to 5 months CPX

## 2019-11-18 NOTE — Progress Notes (Signed)
Subjective:    Patient ID: Monique [name], female    DOB: June 13, 1941, 79 y.o.   MRN: [id number]  DOS:  11/18/2019 Type of visit - description: Follow-up Since the last office visit she is doing well. We talk about urticaria,  HTN and prediabetes Also anxiety depression.    Review of Systems History of thrombocytopenia, denies any unusual bruising or bleeding  Past Medical History:  Diagnosis Date  . Allergy   . Anemia    when had uterine fibroids  . Anxiety   . Cataract    very small  . Cholelithiasis   . Closed fracture of base of fifth metacarpal bone of left hand 06/01/2018  . DJD (degenerative joint disease)   . Dry eye syndrome   . Headache(784.0)   . Hyperlipidemia 01/20/2011  . Hypertension 09/29/2006  . Substance abuse ([place])   . Thrombocytopenia ([place]) 08/27/2011   hematology evaluation 08-2011, return prn  . Urticaria   . Vasomotor rhinitis     Past Surgical History:  Procedure Laterality Date  . ABDOMINAL HYSTERECTOMY     no oophorectomy  . APPENDECTOMY    . CHOLECYSTECTOMY N/A 06/06/2016   Procedure: LAPAROSCOPIC CHOLECYSTECTOMY WITH INTRAOPERATIVE CHOLANGIOGRAM;  Surgeon: Johnathan Hausen, MD;  Location: [facility];  Service: General;  Laterality: N/A;  . DILATION AND CURETTAGE OF UTERUS    . EYE SURGERY Bilateral 05/06/2019  . HERNIA REPAIR    . mole removals    . TONSILLECTOMY    . UMBILICAL HERNIA REPAIR      Allergies as of 11/18/2019      Reactions   Bee Venom Anaphylaxis   Doxycycline Hives, Itching   Penicillins Itching, Nausea And Vomiting, Swelling   syncope   Sertraline Other (See Comments)   Dry Mouth   Azithromycin Itching, Rash   Cerumenex [trolamine] Itching, Rash   Climara [estradiol] Itching, Rash   Nickel Rash      Medication List       Accurate as of Nov 18, 2019  1:42 PM. If you have any questions, ask your nurse or doctor.        STOP taking these medications   aspirin 81 MG tablet Stopped by: Kathlene November, MD   azelastine  0.05 % ophthalmic solution Commonly known as: OPTIVAR Stopped by: Kathlene November, MD   predniSONE 10 MG tablet Commonly known as: DELTASONE Stopped by: Kathlene November, MD     TAKE these medications   acetaminophen 500 MG tablet Commonly known as: TYLENOL Take 500-1,000 mg by mouth daily as needed.   blood glucose meter kit and supplies Dispense based on patient and insurance preference. Check blood sugars no more than twice daily   brimonidine 0.2 % ophthalmic solution Commonly known as: ALPHAGAN Place into both eyes 3 (three) times daily. Dr. [name]   CALCIUM + D PO Take 2 tablets by mouth daily with lunch. Calcium 656m  Vitamin D 800 Units   carvedilol 6.25 MG tablet Commonly known as: COREG Take 1 tablet (6.25 mg total) by mouth 2 (two) times daily with a meal.   cetirizine 10 MG tablet Commonly known as: ZYRTEC Take 10 mg by mouth daily.   cycloSPORINE 0.05 % ophthalmic emulsion Commonly known as: RESTASIS Place 1 drop into both eyes every 12 (twelve) hours.   Delsym 30 MG/5ML liquid Generic drug: dextromethorphan Take 15 mg by mouth as needed for cough.   diphenhydrAMINE 25 MG tablet Commonly known as: BENADRYL Take 25 mg by mouth every 6 (  six) hours as needed for allergies. Reported on 08/06/2015   EPINEPHrine 0.3 mg/0.3 mL Soaj injection Commonly known as: EpiPen 2-Pak Inject 0.3 mLs (0.3 mg total) into the muscle once.   famotidine 20 MG tablet Commonly known as: PEPCID Take 1 tablet (20 mg total) by mouth daily.   fluticasone 50 MCG/ACT nasal spray Commonly known as: FLONASE Place 1-2 sprays into both nostrils daily as needed for allergies or rhinitis.   GLUCOSAMINE CHOND TRIPLE/VIT D PO Take 1 tablet by mouth 2 (two) times daily.   latanoprost 0.005 % ophthalmic solution Commonly known as: XALATAN Place 1 drop into the left eye at bedtime. Dr. [name]   montelukast 10 MG tablet Commonly known as: Singulair Take 1 tablet (10 mg total) by mouth at  bedtime.   multivitamin with minerals Tabs tablet Take 1 tablet by mouth daily with breakfast.   naproxen sodium 220 MG tablet Commonly known as: ALEVE Take 220 mg by mouth daily as needed.   OneTouch Delica Plus [id number] Misc CHECK BLOOD SUGAR NO MORE THAN TWICE DAILY   OneTouch Ultra test strip Generic drug: glucose blood CHECK BLOOD SUGAR NO MORE THAN TWICE DAILY   SOLUBLE FIBER PO Take by mouth. Great Shape 100% Natural Soluble Fiber   spironolactone 25 MG tablet Commonly known as: ALDACTONE Take 1 tablet (25 mg total) by mouth daily.   Systane Ultra 0.4-0.3 % Soln Generic drug: Polyethyl Glycol-Propyl Glycol Place 1 drop into both eyes daily as needed (dry eyes).   Travoprost (BAK Free) 0.004 % Soln ophthalmic solution Commonly known as: TRAVATAN Place 1 drop into the left eye at bedtime. Dr. [name]          Objective:   Physical Exam BP 133/72 (BP Location: Left Arm, Patient Position: Sitting, Cuff Size: Small)   Pulse 79   Temp (!) 97.5 F (36.4 C) (Temporal)   Resp 18   Ht 5' 1"  (1.549 m)   Wt 158 lb 2 oz (71.7 kg)   SpO2 98%   BMI 29.88 kg/m  General:   Well developed, NAD, BMI noted. HEENT:  Normocephalic . Face symmetric, atraumatic Lungs:  CTA B Normal respiratory effort, no intercostal retractions, no accessory muscle use. Heart: RRR,  no murmur.  Lower extremities: no pretibial edema bilaterally  Skin: Not pale. Not jaundice Neurologic:  alert & oriented X3.  Seems to be in good spirits. Speech normal, gait appropriate for age and unassisted Psych--  Cognition and judgment appear intact.  Cooperative with normal attention span and concentration.  Behavior appropriate. No anxious or depressed appearing.      Assessment      Assessment Prediabetes HTN Hyperlipidemia Thrombocytopenia, hematology eval 2013, RTC PRN Allergic rhinitis, urticaria, severe allergic reaction with hives in 2016.-- Dr Donneta Romberg Dry eye syndrome,  cataracts Psych: --Anxiety-depression: d/t husband's health (lost husband 2015), taking care of fam members --Hoarding  Behavior --MCI dx By neuropsychology-- 2018 Cholecystitis and choledocholithiasis: 05-2016, s/p surgery, ERCP  PLAN: Prediabetes: Check A1c HTN: Continue carvedilol, Aldactone, last BMP satisfactory. Hyperlipidemia: Was Rx atorvastatin 03-2019, cholesterol definitely improved but atorvastatin DC'd by dermatology around 07-2019 as the patient was having hives.  Subsequently she improved, I am not 100% sure hives were due to statins. For now recommend a healthy diet and recheck on RTC Aspirin: Okay to stop due to new guidelines Urticaria: Since the last visit she is doing better, saw Dr. Donneta Romberg, no changes made.  See comments on hyperlipidemia. Anxiety, depression: Seems to be doing great,  on no medications Leg pain: Still there, achy around the knees mostly when she sits down for too long and tries to walk.  Recommend observation  Social: Lives with herself, still drives, completely independent. RTC 4 to 5 months CPX   This visit occurred during the SARS-CoV-2 public health emergency.  Safety protocols were in place, including screening questions prior to the visit, additional usage of staff PPE, and extensive cleaning of exam room while observing appropriate contact time as indicated for disinfecting solutions.

## 2019-11-18 NOTE — Progress Notes (Signed)
Pre visit review using our clinic review tool, if applicable. No additional management support is needed unless otherwise documented below in the visit note. 

## 2019-11-18 NOTE — Patient Instructions (Addendum)
You can stop aspirin  Watch your diet closely, will check your cholesterol when you come back   GO TO THE LAB : Get the blood work     [place], [place] back for for a physical exam in 4 to 5 months

## 2019-12-12 ENCOUNTER — Telehealth: Payer: Self-pay | Admitting: Internal Medicine

## 2019-12-12 ENCOUNTER — Ambulatory Visit (HOSPITAL_COMMUNITY)
Admission: EM | Admit: 2019-12-12 | Discharge: 2019-12-12 | Disposition: A | Discharge status: Home / Self Care | Payer: PPO | Attending: Family Medicine | Admitting: Family Medicine

## 2019-12-12 ENCOUNTER — Other Ambulatory Visit: Payer: Self-pay

## 2019-12-12 ENCOUNTER — Encounter (HOSPITAL_COMMUNITY): Payer: Self-pay

## 2019-12-12 DIAGNOSIS — L509 Urticaria, unspecified: Secondary | ICD-10-CM

## 2019-12-12 MED ORDER — PREDNISONE 20 MG PO TABS: 20.0000 mg | ORAL_TABLET | Freq: Two times a day (BID) | ORAL | 0 refills | Status: DC

## 2019-12-12 NOTE — ED Triage Notes (Addendum)
Pt is here with a on & off rash that started months ago that has spread all over her body, pt has taken Benadryl to relieve discomfort.

## 2019-12-12 NOTE — Discharge Instructions (Addendum)
Take the zyrtec (cetirizine) 2 x a day This is for the itching Continue the singulair at bedtime Take the prednisone for a few days Watch your blood sugar Read information on Hives

## 2019-12-12 NOTE — Telephone Encounter (Signed)
Caller Shadi Larner  Call Back # [phone]  Patient states she rash all over her body and would like Dr Larose Kells to advise her on what to do.  Please Advise

## 2019-12-12 NOTE — Telephone Encounter (Signed)
Please advise 

## 2019-12-12 NOTE — ED Provider Notes (Signed)
Seth Ward    CSN: [id number] Arrival date & time: 12/12/19  [place]      History   Chief Complaint Chief Complaint  Patient presents with  . Rash    HPI Monique [name] is a 79 y.o. female.   HPI Patient recently had a bout of urticaria It was treated with prednisone, Zyrtec, Singulair She got over her rash and now it is come back She does not know why she is breaking out in this rash Last time she thought it was from clindamycin This time she is uncertain No shortness of breath, change in voice, difficulty breathing or swallowing She states that the rash itches terribly She has not taken anything for her rash Past Medical History:  Diagnosis Date  . Allergy   . Anemia    when had uterine fibroids  . Anxiety   . Cataract    very small  . Cholelithiasis   . Closed fracture of base of fifth metacarpal bone of left hand 06/01/2018  . DJD (degenerative joint disease)   . Dry eye syndrome   . Headache(784.0)   . Hyperlipidemia 01/20/2011  . Hypertension 09/29/2006  . Substance abuse ([place])   . Thrombocytopenia ([place]) 08/27/2011   hematology evaluation 08-2011, return prn  . Urticaria   . Vasomotor rhinitis     Patient Active Problem List   Diagnosis Date Noted  . MCI (mild cognitive impairment) 02/06/2017  . 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  . GERD (gastroesophageal reflux disease) 06/05/2016  . PCP NOTES >>>>>>>>>>>>>>>>>>>>>>>>> 02/04/2016  . Severe allergic reaction 08/17/2014  . Allergic rhinitis 12/01/2011  . Thrombocytopenia ([place]) 08/27/2011  . Hyperlipidemia 01/20/2011  . Annual physical exam 09/18/2010  . Hyperglycemia 09/17/2009  . Backache 07/10/2008  . Anxiety and depression 01/19/2008  . HX OF GALLSTONE 10/20/2006  . Essential hypertension 09/29/2006    Past Surgical History:  Procedure Laterality Date  . ABDOMINAL HYSTERECTOMY     no oophorectomy  . APPENDECTOMY    . CHOLECYSTECTOMY N/A 06/06/2016   Procedure: LAPAROSCOPIC  CHOLECYSTECTOMY WITH INTRAOPERATIVE CHOLANGIOGRAM;  Surgeon: Johnathan Hausen, MD;  Location: [facility];  Service: General;  Laterality: N/A;  . DILATION AND CURETTAGE OF UTERUS    . EYE SURGERY Bilateral 05/06/2019  . HERNIA REPAIR    . mole removals    . TONSILLECTOMY    . UMBILICAL HERNIA REPAIR      OB History   No obstetric history on file.      Home Medications    Prior to Admission medications   Medication Sig Start Date End Date Taking? Authorizing Provider  acetaminophen (TYLENOL) 500 MG tablet Take 500-1,000 mg by mouth daily as needed.    [provider]  blood glucose meter kit and supplies Dispense based on patient and insurance preference. Check blood sugars no more than twice daily 05/25/19   Colon Branch, MD  brimonidine [facility]) 0.2 % ophthalmic solution Place into both eyes 3 (three) times daily. Dr. [name]    [provider]  Calcium-Vitamin D (CALCIUM + D PO) Take 2 tablets by mouth daily with lunch. Calcium 633m  Vitamin D 800 Units    [provider]  carvedilol (COREG) 6.25 MG tablet Take 1 tablet (6.25 mg total) by mouth 2 (two) times daily with a meal. 08/08/19   PColon Branch MD  cetirizine (ZYRTEC) 10 MG tablet Take 10 mg by mouth daily.    [provider]  cycloSPORINE (RESTASIS) 0.05 % ophthalmic emulsion Place 1  drop into both eyes every 12 (twelve) hours.     [provider]  dextromethorphan (DELSYM) 30 MG/5ML liquid Take 15 mg by mouth as needed for cough.    [provider]  diphenhydrAMINE (BENADRYL) 25 MG tablet Take 25 mg by mouth every 6 (six) hours as needed for allergies. Reported on 08/06/2015    [provider]  EPINEPHrine (EPIPEN 2-PAK) 0.3 mg/0.3 mL IJ SOAJ injection Inject 0.3 mLs (0.3 mg total) into the muscle once. Patient not taking: Reported on 03/09/2018 11/27/15   Colon Branch, MD  famotidine (PEPCID) 20 MG tablet Take 1 tablet (20 mg total) by mouth daily. 03/09/18   Colon Branch,  MD  fluticasone [facility]) 50 MCG/ACT nasal spray Place 1-2 sprays into both nostrils daily as needed for allergies or rhinitis.    [provider]  Gluc-Chonn-MSM-Boswellia-Vit D (GLUCOSAMINE CHOND TRIPLE/VIT D PO) Take 1 tablet by mouth 2 (two) times daily.    [provider]  Lancets (ONETOUCH DELICA PLUS BTDHRC16L) [place] BLOOD SUGAR NO MORE THAN TWICE DAILY 07/22/19   Colon Branch, MD  latanoprost (XALATAN) 0.005 % ophthalmic solution Place 1 drop into the left eye at bedtime. Dr. [name]    [provider]  Methylcellulose, Laxative, (SOLUBLE FIBER PO) Take by mouth. Great Shape 100% Natural Soluble Fiber    [provider]  montelukast (SINGULAIR) 10 MG tablet Take 1 tablet (10 mg total) by mouth at bedtime. 10/20/19   Colon Branch, MD  Multiple Vitamin (MULTIVITAMIN WITH MINERALS) TABS Take 1 tablet by mouth daily with breakfast.    [provider]  naproxen sodium (ALEVE) 220 MG tablet Take 220 mg by mouth daily as needed.    [provider]  ONETOUCH ULTRA test strip CHECK BLOOD SUGAR NO MORE THAN TWICE DAILY 07/22/19   Colon Branch, MD  Polyethyl Glycol-Propyl Glycol (SYSTANE ULTRA) 0.4-0.3 % SOLN Place 1 drop into both eyes daily as needed (dry eyes).     [provider]  predniSONE (DELTASONE) 20 MG tablet Take 1 tablet (20 mg total) by mouth 2 (two) times daily with a meal. 12/12/19   Raylene Everts, MD  spironolactone (ALDACTONE) 25 MG tablet Take 1 tablet (25 mg total) by mouth daily. 08/08/19   Colon Branch, MD  Travoprost, BAK Free, (TRAVATAN) 0.004 % SOLN ophthalmic solution Place 1 drop into the left eye at bedtime. Dr. [name]    [provider]    Family History Family History  Problem Relation Age of Onset  . Coronary artery disease Father   . Cancer Father        unsure of type  . Other Father        Guillain- barre syndrome  . Diabetes Other        uncles   . Hypertension Mother   . Anxiety  disorder Mother   . Depression Mother   . Prostate cancer Other        grandfather  . Breast cancer Sister 72  . Depression Maternal Uncle   . Colon cancer Neg Hx   . Colon polyps Neg Hx   . Rectal cancer Neg Hx   . Stomach cancer Neg Hx     Social History Social History   Tobacco Use  . Smoking status: Never Smoker  . Smokeless tobacco: Never Used  Vaping Use  . Vaping Use: Never used  Substance Use Topics  . Alcohol use: No  . Drug  use: No     Allergies   Bee venom, Doxycycline, Penicillins, Sertraline, Azithromycin, Cerumenex [trolamine], Climara [estradiol], and Nickel   Review of Systems Review of Systems  Skin: Positive for rash.    Physical Exam Triage Vital Signs ED Triage Vitals  Enc Vitals Group     BP 12/12/19 1918 131/75     Pulse Rate 12/12/19 1918 92     Resp 12/12/19 1918 17     Temp 12/12/19 1918 97.7 F (36.5 C)     Temp Source 12/12/19 1918 Oral     SpO2 12/12/19 1918 100 %     Weight 12/12/19 1922 155 lb (70.3 kg)     Height --      Head Circumference --      Peak Flow --      Pain Score 12/12/19 1917 0     Pain Loc --      Pain Edu? --      Excl. in [place]? --    No data found.  Updated Vital Signs BP 131/75 (BP Location: Right Arm)   Pulse 92   Temp 97.7 F (36.5 C) (Oral)   Resp 17   Wt 70.3 kg   SpO2 100%   BMI 29.29 kg/m         Physical Exam Constitutional:      General: She is not in acute distress.    Appearance: She is well-developed.  HENT:     Head: Normocephalic and atraumatic.  Eyes:     Conjunctiva/sclera: Conjunctivae normal.     Pupils: Pupils are equal, round, and reactive to light.  Cardiovascular:     Rate and Rhythm: Normal rate and regular rhythm.     Heart sounds: Normal heart sounds.  Pulmonary:     Effort: Pulmonary effort is normal. No respiratory distress.     Comments: Lungs are clear Abdominal:     General: There is no distension.     Palpations: Abdomen is soft.  Musculoskeletal:         General: Normal range of motion.     Cervical back: Normal range of motion.  Skin:    General: Skin is warm and dry.     Comments: Covered in urticarial wheals,  covering much of her surface area front back arms legs trunk and face  Neurological:     Mental Status: She is alert.      UC Treatments / Results  Labs (all labs ordered are listed, but only abnormal results are displayed) Labs Reviewed - No data to display  EKG   Radiology No results found.  Procedures Procedures (including critical care time)  Medications Ordered in UC Medications - No data to display  Initial Impression / Assessment and Plan / UC Course  I have reviewed the triage vital signs and the nursing notes.  Pertinent labs & imaging results that were available during my care of the patient were reviewed by me and considered in my medical decision making (see chart for details).     I reviewed with the patient the instructions that were given to her last time for the urticaria Final Clinical Impressions(s) / UC Diagnoses   Final diagnoses:  Urticaria     Discharge Instructions     Take the zyrtec (cetirizine) 2 x a day This is for the itching Continue the singulair at bedtime Take the prednisone for a few days Watch your blood sugar Read information on Hives   ED Prescriptions  Medication Sig Dispense Auth. Provider   predniSONE (DELTASONE) 20 MG tablet Take 1 tablet (20 mg total) by mouth 2 (two) times daily with a meal. 10 tablet Raylene Everts, MD     PDMP not reviewed this encounter.   Raylene Everts, MD 12/12/19 [id number]

## 2019-12-12 NOTE — Telephone Encounter (Signed)
If she has fever, chills, headache or is not feeling well: Needs to be seen ASAP or go to the ER. Otherwise to schedule a visit within the next 2 to 3 days

## 2019-12-12 NOTE — Telephone Encounter (Signed)
Spoke w/ Pt- informed of recommendations. Appt scheduled 12/14/19.

## 2019-12-14 ENCOUNTER — Ambulatory Visit (INDEPENDENT_AMBULATORY_CARE_PROVIDER_SITE_OTHER): Payer: PPO | Admitting: Internal Medicine

## 2019-12-14 ENCOUNTER — Other Ambulatory Visit: Payer: Self-pay

## 2019-12-14 ENCOUNTER — Encounter: Payer: Self-pay | Admitting: Internal Medicine

## 2019-12-14 VITALS — BP 153/83 | HR 99 | Temp 97.9°F | Resp 18 | Ht 61.0 in | Wt 164.0 lb

## 2019-12-14 DIAGNOSIS — L509 Urticaria, unspecified: Secondary | ICD-10-CM

## 2019-12-14 MED ORDER — ONETOUCH ULTRA VI STRP: ORAL_STRIP | 12 refills | Status: DC

## 2019-12-14 NOTE — Progress Notes (Signed)
Pre visit review using our clinic review tool, if applicable. No additional management support is needed unless otherwise documented below in the visit note. 

## 2019-12-14 NOTE — Patient Instructions (Addendum)
Continue taking prednisone until you finish  Continue Singulair once daily  Okay to take Zyrtec twice a day temporarily  Have your EpiPen available   Diabetes: Check your blood sugar few  times a week   Fasting before a meal 70- 130 2 hours after a meal less than 180

## 2019-12-14 NOTE — Progress Notes (Signed)
Subjective:    Patient ID: Monique [name], female    DOB: 05-23-1941, 79 y.o.   MRN: [id number]  DOS:  12/14/2019 Type of visit - description: Acute Continue with urticaria, described as itching and redness all over, episodes are typically occurring in the morning or at night.  Was seen at the urgent care 12/12/2019 with above symptoms. Prescribed prednisone Recommend Zyrtec twice a day, continue singular. Since then she is better  Review of Systems Denies lip or tongue swelling. No mouth blisters No known history of a tick bite recently  Past Medical History:  Diagnosis Date  . Allergy   . Anemia    when had uterine fibroids  . Anxiety   . Cataract    very small  . Cholelithiasis   . Closed fracture of base of fifth metacarpal bone of left hand 06/01/2018  . DJD (degenerative joint disease)   . Dry eye syndrome   . Headache(784.0)   . Hyperlipidemia 01/20/2011  . Hypertension 09/29/2006  . Substance abuse ([place])   . Thrombocytopenia ([place]) 08/27/2011   hematology evaluation 08-2011, return prn  . Urticaria   . Vasomotor rhinitis     Past Surgical History:  Procedure Laterality Date  . ABDOMINAL HYSTERECTOMY     no oophorectomy  . APPENDECTOMY    . CHOLECYSTECTOMY N/A 06/06/2016   Procedure: LAPAROSCOPIC CHOLECYSTECTOMY WITH INTRAOPERATIVE CHOLANGIOGRAM;  Surgeon: Johnathan Hausen, MD;  Location: [facility];  Service: General;  Laterality: N/A;  . DILATION AND CURETTAGE OF UTERUS    . EYE SURGERY Bilateral 05/06/2019  . HERNIA REPAIR    . mole removals    . TONSILLECTOMY    . UMBILICAL HERNIA REPAIR      Allergies as of 12/14/2019      Reactions   Bee Venom Anaphylaxis   Doxycycline Hives, Itching   Penicillins Itching, Nausea And Vomiting, Swelling   syncope   Sertraline Other (See Comments)   Dry Mouth   Azithromycin Itching, Rash   Cerumenex [trolamine] Itching, Rash   Climara [estradiol] Itching, Rash   Nickel Rash      Medication List       Accurate as  of December 14, 2019  1:29 PM. If you have any questions, ask your nurse or doctor.        acetaminophen 500 MG tablet Commonly known as: TYLENOL Take 500-1,000 mg by mouth daily as needed.   blood glucose meter kit and supplies Dispense based on patient and insurance preference. Check blood sugars no more than twice daily   brimonidine 0.2 % ophthalmic solution Commonly known as: ALPHAGAN Place into both eyes 3 (three) times daily. Dr. [name]   CALCIUM + D PO Take 2 tablets by mouth daily with lunch. Calcium 673m  Vitamin D 800 Units   carvedilol 6.25 MG tablet Commonly known as: COREG Take 1 tablet (6.25 mg total) by mouth 2 (two) times daily with a meal.   cetirizine 10 MG tablet Commonly known as: ZYRTEC Take 10 mg by mouth daily.   cycloSPORINE 0.05 % ophthalmic emulsion Commonly known as: RESTASIS Place 1 drop into both eyes every 12 (twelve) hours.   Delsym 30 MG/5ML liquid Generic drug: dextromethorphan Take 15 mg by mouth as needed for cough.   diphenhydrAMINE 25 MG tablet Commonly known as: BENADRYL Take 25 mg by mouth every 6 (six) hours as needed for allergies. Reported on 08/06/2015   EPINEPHrine 0.3 mg/0.3 mL Soaj injection Commonly known as: EpiPen 2-Pak Inject 0.3 mLs (  0.3 mg total) into the muscle once.   famotidine 20 MG tablet Commonly known as: PEPCID Take 1 tablet (20 mg total) by mouth daily.   fluticasone 50 MCG/ACT nasal spray Commonly known as: FLONASE Place 1-2 sprays into both nostrils daily as needed for allergies or rhinitis.   GLUCOSAMINE CHOND TRIPLE/VIT D PO Take 1 tablet by mouth 2 (two) times daily.   latanoprost 0.005 % ophthalmic solution Commonly known as: XALATAN Place 1 drop into the left eye at bedtime. Dr. [name]   montelukast 10 MG tablet Commonly known as: Singulair Take 1 tablet (10 mg total) by mouth at bedtime.   multivitamin with minerals Tabs tablet Take 1 tablet by mouth daily with breakfast.   naproxen  sodium 220 MG tablet Commonly known as: ALEVE Take 220 mg by mouth daily as needed.   OneTouch Delica Plus [id number] Misc CHECK BLOOD SUGAR NO MORE THAN TWICE DAILY   OneTouch Ultra test strip Generic drug: glucose blood CHECK BLOOD SUGAR NO MORE THAN TWICE DAILY   predniSONE 20 MG tablet Commonly known as: Deltasone Take 1 tablet (20 mg total) by mouth 2 (two) times daily with a meal.   SOLUBLE FIBER PO Take by mouth. Great Shape 100% Natural Soluble Fiber   spironolactone 25 MG tablet Commonly known as: ALDACTONE Take 1 tablet (25 mg total) by mouth daily.   Systane Ultra 0.4-0.3 % Soln Generic drug: Polyethyl Glycol-Propyl Glycol Place 1 drop into both eyes daily as needed (dry eyes).   Travoprost (BAK Free) 0.004 % Soln ophthalmic solution Commonly known as: TRAVATAN Place 1 drop into the left eye at bedtime. Dr. [name]          Objective:   Physical Exam BP (!) 153/83 (BP Location: Left Arm, Patient Position: Sitting, Cuff Size: Small)   Pulse 99   Temp 97.9 F (36.6 C) (Temporal)   Resp 18   Ht _0  (1.549 m)   Wt 164 lb (74.4 kg)   SpO2 96%   BMI 30.99 kg/m  General:   Well developed, NAD, BMI noted. HEENT:  Normocephalic . Face symmetric, atraumatic  Lower extremities: no pretibial edema bilaterally  Skin: Not pale. Not jaundice.  Currently with no hives Neurologic:  alert & oriented X3.  Speech normal, gait appropriate for age and unassisted Psych--  Cognition and judgment appear intact.  Cooperative with normal attention span and concentration.  Behavior appropriate. No anxious or depressed appearing.      Assessment     Assessment Prediabetes HTN Hyperlipidemia Thrombocytopenia, hematology eval 2013, RTC PRN Allergic rhinitis, urticaria, severe allergic reaction with hives in 2016.-- Dr Donneta Romberg Dry eye syndrome, cataracts Psych: --Anxiety-depression: d/t husband's health (lost husband 2015), taking care of fam members --Hoarding   Behavior --MCI dx By neuropsychology-- 2018 Cholecystitis and choledocholithiasis: 05-2016, s/p surgery, ERCP  PLAN: Urticaria: Ongoing symptoms, Dr. Donneta Romberg has refer her to dermatology per patient, appointment pending. Had a recent exacerbation, went to UC, recommend prednisone for few days, continue Singulair, increase Zyrtec to twice a day which she is tolerating without excessive somnolence.  Today she has no urticarial rashes. Hyperglycemia: Concerned about hypoglycemia and the need to take prednisone, see AVS, recommend to check ambulatory CBGs.   This visit occurred during the SARS-CoV-2 public health emergency.  Safety protocols were in place, including screening questions prior to the visit, additional usage of staff PPE, and extensive cleaning of exam room while observing appropriate contact time as indicated for disinfecting solutions.

## 2019-12-15 ENCOUNTER — Telehealth: Payer: Self-pay | Admitting: Internal Medicine

## 2019-12-15 DIAGNOSIS — L509 Urticaria, unspecified: Secondary | ICD-10-CM | POA: Insufficient documentation

## 2019-12-15 NOTE — Assessment & Plan Note (Signed)
Urticaria: Ongoing symptoms, Dr. Donneta Romberg has refer her to dermatology per patient, appointment pending. Had a recent exacerbation, went to UC, recommend prednisone for few days, continue Singulair, increase Zyrtec to twice a day which she is tolerating without excessive somnolence.  Today she has no urticarial rashes. Hyperglycemia: Concerned about hypoglycemia and the need to take prednisone, see AVS, recommend to check ambulatory CBGs.

## 2019-12-15 NOTE — Telephone Encounter (Signed)
Caller : Monique [name]  Call Back # [phone]  Patient states that she spoke with  Her Eye Dr. Dr. [name] in regards to eye drops. Per Dr. Patient should take : latanoprost (XALATAN) 0.005 % ophthalmic solution To control pressure in her eyes.

## 2019-12-15 NOTE — Telephone Encounter (Signed)
FYI

## 2019-12-15 NOTE — Telephone Encounter (Signed)
Please update her medication list

## 2019-12-16 NOTE — Telephone Encounter (Signed)
Done

## 2019-12-29 DIAGNOSIS — L509 Urticaria, unspecified: Secondary | ICD-10-CM | POA: Diagnosis not present

## 2020-02-06 DIAGNOSIS — H401132 Primary open-angle glaucoma, bilateral, moderate stage: Secondary | ICD-10-CM | POA: Diagnosis not present

## 2020-02-15 ENCOUNTER — Other Ambulatory Visit (HOSPITAL_BASED_OUTPATIENT_CLINIC_OR_DEPARTMENT_OTHER): Payer: Self-pay | Admitting: Internal Medicine

## 2020-02-15 DIAGNOSIS — Z1231 Encounter for screening mammogram for malignant neoplasm of breast: Secondary | ICD-10-CM

## 2020-03-06 ENCOUNTER — Ambulatory Visit: Payer: PPO

## 2020-03-06 DIAGNOSIS — I10 Essential (primary) hypertension: Secondary | ICD-10-CM

## 2020-03-06 DIAGNOSIS — R739 Hyperglycemia, unspecified: Secondary | ICD-10-CM

## 2020-03-06 NOTE — Chronic Care Management (AMB) (Signed)
Chronic Care Management Pharmacy  Name: Monique [name]  MRN: [id number] DOB: May 18, 1941   Chief Complaint/ HPI  Monique [name],  79 y.o. , female presents for their Follow-Up CCM visit with the clinical pharmacist via telephone due to COVID-19 Pandemic.  PCP : [name], MD  Their chronic conditions include: Pre-DM, HTN, HLD, GERD, Allergic Rhinitis, Pain, Constipation, Gluacoma  Office Visits: 12/15/19 - Pt message stating she is to take lantanoprost for eye pressure  12/14/19: Visit w/ Dr. Drue Novel - pt had urticaria exacerbation and went to urgent care. Pt concerned with BG. Recommended to check BG.  11/18/19: Visit w/ Dr. Drue Novel - Leg Pain improving. Mentation back to baseline. Ok to stop aspirin per new guidelines, not currently on statin recommend healthy lifestyle and recheck at RTC in 4-5 months.   10/20/19: Visit w/ Dr. Drue Novel - Urticaria. Restart singulair. Low dose prednisone.Referral back to Dr. Altoona Callas. Leg pain improving.   10/12/19: Visit w/ Dr. Drue Novel - Leg pain. Sxs not consistent with PVD. Will check BMP, CBC, and total CK (all negative). Cellulitis likely. Prescribed cephalexin. Pt was on lipitor with great results, but this was D/C per dermatologist. Continue to monitor possible dementia.   Urgent Care:  12/12/19: [facility] -  Urticaria. Take cetirizine twice daily, singulair at bedtime, and prednisone.   Medications: Outpatient Encounter Medications as of 03/06/2020  Medication Sig Note  . acetaminophen (TYLENOL) 500 MG tablet Take 500-1,000 mg by mouth daily as needed.   . blood glucose meter kit and supplies Dispense based on patient and insurance preference. Check blood sugars no more than twice daily   . brimonidine (ALPHAGAN) 0.2 % ophthalmic solution Place into both eyes 3 (three) times daily. Dr. Harriette Bouillon   . Calcium-Vitamin D (CALCIUM + D PO) Take 2 tablets by mouth daily with lunch. Calcium 600mg   Vitamin D 800 Units   . carvedilol  (COREG) 6.25 MG tablet Take 1 tablet (6.25 mg total) by mouth 2 (two) times daily with a meal.   . cetirizine (ZYRTEC) 10 MG tablet Take 10 mg by mouth daily.   . cycloSPORINE (RESTASIS) 0.05 % ophthalmic emulsion Place 1 drop into both eyes every 12 (twelve) hours.    Marland Kitchen dextromethorphan (DELSYM) 30 MG/5ML liquid Take 15 mg by mouth as needed for cough. 04/22/2017: As needed  . diphenhydrAMINE (BENADRYL) 25 MG tablet Take 25 mg by mouth every 6 (six) hours as needed for allergies. Reported on 08/06/2015 02/04/2016: Liquid, PRN  . EPINEPHrine (EPIPEN 2-PAK) 0.3 mg/0.3 mL IJ SOAJ injection Inject 0.3 mLs (0.3 mg total) into the muscle once. (Patient not taking: Reported on 03/09/2018) 03/09/2018: PRN  . famotidine (PEPCID) 20 MG tablet Take 1 tablet (20 mg total) by mouth daily.   . fluticasone (FLONASE) 50 MCG/ACT nasal spray Place 1-2 sprays into both nostrils daily as needed for allergies or rhinitis. 02/04/2016: PRN  . Gluc-Chonn-MSM-Boswellia-Vit D (GLUCOSAMINE CHOND TRIPLE/VIT D PO) Take 1 tablet by mouth 2 (two) times daily.   Marland Kitchen glucose blood (ONETOUCH ULTRA) test strip CHECK BLOOD SUGAR NO MORE THAN TWICE DAILY   . Lancets (ONETOUCH DELICA PLUS LANCET33G) MISC CHECK BLOOD SUGAR NO MORE THAN TWICE DAILY   . latanoprost (XALATAN) 0.005 % ophthalmic solution Place 1 drop into the left eye at bedtime. Dr. Harriette Bouillon   . Methylcellulose, Laxative, (SOLUBLE FIBER PO) Take by mouth. Great Shape 100% Natural Soluble Fiber   . montelukast (SINGULAIR) 10 MG tablet Take 1 tablet (  10 mg total) by mouth at bedtime.   . Multiple Vitamin (MULTIVITAMIN WITH MINERALS) TABS Take 1 tablet by mouth daily with breakfast.   . naproxen sodium (ALEVE) 220 MG tablet Take 220 mg by mouth daily as needed. 10/12/2019: PRN  . Polyethyl Glycol-Propyl Glycol (SYSTANE ULTRA) 0.4-0.3 % SOLN Place 1 drop into both eyes daily as needed (dry eyes).    . predniSONE (DELTASONE) 20 MG tablet Take 1 tablet (20 mg total) by mouth 2 (two) times  daily with a meal.   . spironolactone (ALDACTONE) 25 MG tablet Take 1 tablet (25 mg total) by mouth daily.   . Travoprost, BAK Free, (TRAVATAN) 0.004 % SOLN ophthalmic solution Place 1 drop into the left eye at bedtime. Dr. Harriette Bouillon (Patient not taking: Reported on 12/14/2019)    No facility-administered encounter medications on file as of 03/06/2020.   Immunization History  Administered Date(s) Administered  . Fluad Quad(high Dose 65+) 03/21/2019  . Influenza Split 05/02/2014  . Influenza Whole 05/14/2007, 04/09/2009, 04/03/2010  . Influenza, High Dose Seasonal PF 04/06/2015, 04/08/2018  . Influenza, Seasonal, Injecte, Preservative Fre 04/12/2013  . Influenza-Unspecified 08/17/2017  . PFIZER SARS-COV-2 Vaccination 09/16/2019, 10/11/2019  . Pneumococcal Conjugate-13 11/30/2013  . Pneumococcal Polysaccharide-23 06/30/2001, 05/14/2007, 09/08/2018  . Td 10/08/2004  . Tdap 04/12/2013  . Zoster 06/30/2005  . Zoster Recombinat (Shingrix) 03/11/2018, 05/20/2018   Received high dose flu vaccine on 02/24/20 at [facility] (Fluzone high dose quad)   Current Diagnosis/Assessment:  Goals Addressed            This Visit's Progress   . Chronic Care Management Pharmacy Care Plan       CARE PLAN ENTRY (see longitudinal plan of care for additional care plan information)  Current Barriers:  . Chronic Disease Management support, education, and care coordination needs related to Pre-DM, HTN, HLD, GERD, Allergic Rhinitis, Pain, Constipation, Gluacoma   Hypertension BP Readings from Last 3 Encounters:  12/14/19 (!) 153/83  12/12/19 131/75  11/18/19 133/72   . Pharmacist Clinical Goal(s): o Over the next 90 days, patient will work with PharmD and providers to maintain BP goal <140/90 . Current regimen:   Carvedilol 6.25mg  BID  Spironolactone 25mg  daily . Interventions: o Discussed checking BP 30 minutes to 1 hour after taking blood pressure medications . Patient self care activities - Over the  next 90 days, patient will: o Check BP 3-4 times per week, document, and provide at future appointments o Ensure daily salt intake < 2300 mg/Cheryll Keisler  Hyperlipidemia Lab Results  Component Value Date/Time   LDLCALC 58 05/23/2019 07:09 AM   . Pharmacist Clinical Goal(s): o Over the next 90 days, patient will work with PharmD and providers to maintain LDL goal < 100 . Current regimen:  o Diet and exercise management  . Patient self care activities - Over the next 90 days, patient will: o Maintain LDL less than 100  Pre-Diabetes Lab Results  Component Value Date/Time   HGBA1C 5.8 11/18/2019 08:53 AM   HGBA1C 6.1 03/21/2019 09:10 AM   . Pharmacist Clinical Goal(s): o Over the next 90 days, patient will work with PharmD and providers to maintain A1c goal <6.5% . Current regimen:  o Diet and exercise management   . Interventions: o Discussed diet and exercise . Patient self care activities - Over the next 90 days, patient will: o Check blood sugar 1-2 times per week, document, and provide at future appointments o Contact provider with any episodes of hypoglycemia  Medication management .  Pharmacist Clinical Goal(s): o Over the next 90 days, patient will work with PharmD and providers to maintain optimal medication adherence . Current pharmacy: [facility] . Interventions o Comprehensive medication review performed. o Continue current medication management strategy . Patient self care activities - Over the next 90 days, patient will: o Focus on medication adherence by filling and taking medications appropriately  o Take medications as prescribed o Report any questions or concerns to PharmD and/or provider(s)  Please see past updates related to this goal by clicking on the "Past Updates" button in the selected goal        Pre-Diabetes   A1c goal <6.5%  Recent Relevant Labs: Lab Results  Component Value Date/Time   HGBA1C 5.8 11/18/2019 08:53 AM   HGBA1C 6.1 03/21/2019 09:10 AM      Patient is currently controlled on the following medications: None  Last diabetic Eye exam:  Lab Results  Component Value Date/Time   HMDIABEYEEXA No Retinopathy 02/17/2017 12:00 AM   BG this morning 137 Highest: 150 (since 200 at eye doctor) Lowest: 42 (denies any symptoms of hypoglycemia) Ate sugar free cookies night before  She has been trying to limit potatoes Attempts to do exercises in the home  We discussed: diet and exercise extensively   Update 03/06/20 A1c improved. Congratulated patient on this.  Checks BG about twice per week.  101 Between meals 87 Between meals 114 Between meals 105 fasting 97 hs 97 fasting 85 fasting 108 fasting 79 Between meals 94 Between meals Avg 96.7   Exercise  Working around the house. Hasn't restarted the walks she used to do due to covid limitations.  Plan -Continue control with diet and exercise   Hypertension   BP goal <140/90  CMP Latest Ref Rng & Units 10/12/2019 05/23/2019 03/21/2019  Glucose 70 - 99 mg/dL 94 84 96  BUN 6 - 23 mg/dL 12 20 10   Creatinine 0.40 - 1.20 mg/dL 1.61 0.96 0.45  Sodium 135 - 145 mEq/L 139 142 142  Potassium 3.5 - 5.1 mEq/L 3.9 4.4 4.5  Chloride 96 - 112 mEq/L 104 105 104  CO2 19 - 32 mEq/L 26 25 30   Calcium 8.4 - 10.5 mg/dL 9.8 40.9 81.1  Total Protein 6.0 - 8.3 g/dL - - 6.8  Total Bilirubin 0.2 - 1.2 mg/dL - - 0.7  Alkaline Phos 39 - 117 U/L - - 67  AST 0 - 37 U/L - 20 23  ALT 0 - 35 U/L - 17 19   Office blood pressures are  BP Readings from Last 3 Encounters:  12/14/19 (!) 153/83  12/12/19 131/75  11/18/19 133/72    Patient has failed these meds in the past: None noted   Patient is currently controlled on the following medications:   Carvedilol 6.25mg  BID  Spironolactone 25mg  daily  Patient checks BP at home 1-2x per week  We discussed: Proper blood pressure measurement technique and preference for arm BP cuffs vs wrist cuff due to accuracy  Update  03/06/20 141 76 85 146 79 68 152 79 72 150 84 80 141 81 84 130 77 74 146 76 65 151 82 74  140 76 75 122 70 74 Average 141.9 78 75.1  When she checks BP in the morning it is before she takes her morning BP meds.  Other times she checks her BP between meals and feels these readings are better.  Advised patient to wait 30 mins to 1 hr after taking BP meds to check BP.  Plan -Check blood pressure 30 minutes to 1 hour after taking blood pressure meds -Continue current medications    Allergies/Hives    Patient has failed these meds in past: montelukast (was D/C'd due to famotidine/cetirizine combo) Patient is currently stable, but present on the following medications:   Famotidine 20mg  BID  Cetirizine 10mg  daily  Montelukast 10mg  daily  Prescribed, but not currently using Mometasone furmarate 0.1% BID PRN (may restart this one for hives) Triamcinolone 0.1% cream  Still having hives, but not using creams Feels hives bet worse in the evening Feels like her whole body breaks out into red spots  We discussed:  How we can reduce polypharmacy with regard to medications for hives and glaucoma   Update 03/06/20 Dr. Sharyn Lull wants her to remain on montelukast for at least a year.  Pt was informed there may be something in her blood that triggers her hive episodes?  Plan -Continue current medications    Glaucoma    Patient has failed these meds in past: None noted Patient is currently controlled on the following medications:   Latanoprost 1 drop in L eye daily  Restasis 0.05% 1 drop both eyes twice daily  Brimonidine 0.2% 1 drop both eyes twice daily  We discussed:  duplication of prostaglandin eye drop and that patient should most likely only be on one. Patient reports she is supposed to use both. Will try to obtain records from ophthalmology to confirm   Update 03/06/20 Resolved that patient is to take lantanoprost vs travoprost  Plan -Continue current medications,  noting she most likely should only be on one prostaglandin eye drop   Miscellaneous Meds Systane PRN Dry Eye Nystatin cream - rash under breast (Dr. Aldona Bar) Doylene Canning bond body powder - rash under breast (Dr. Aldona Bar)  Dannielle Karvonen Tannya Gonet, PharmD Clinical Pharmacist [facility] [phone]

## 2020-03-21 ENCOUNTER — Other Ambulatory Visit: Payer: Self-pay

## 2020-03-21 ENCOUNTER — Ambulatory Visit (HOSPITAL_BASED_OUTPATIENT_CLINIC_OR_DEPARTMENT_OTHER)
Admission: RE | Admit: 2020-03-21 | Discharge: 2020-03-21 | Disposition: A | Discharge status: Home / Self Care | Payer: PPO | Source: Ambulatory Visit | Attending: Internal Medicine | Admitting: Internal Medicine

## 2020-03-21 DIAGNOSIS — Z1231 Encounter for screening mammogram for malignant neoplasm of breast: Secondary | ICD-10-CM | POA: Diagnosis not present

## 2020-03-29 NOTE — Patient Instructions (Signed)
Visit Information  Goals Addressed            This Visit's Progress   . Chronic Care Management Pharmacy Care Plan       CARE PLAN ENTRY (see longitudinal plan of care for additional care plan information)  Current Barriers:  . Chronic Disease Management support, education, and care coordination needs related to Pre-DM, HTN, HLD, GERD, Allergic Rhinitis, Pain, Constipation, Gluacoma   Hypertension BP Readings from Last 3 Encounters:  12/14/19 (!) 153/83  12/12/19 131/75  11/18/19 133/72   . Pharmacist Clinical Goal(s): o Over the next 90 days, patient will work with PharmD and providers to maintain BP goal <140/90 . Current regimen:   Carvedilol 6.25mg  BID  Spironolactone 25mg  daily . Interventions: o Discussed checking BP 30 minutes to 1 hour after taking blood pressure medications . Patient self care activities - Over the next 90 days, patient will: o Check BP 3-4 times per week, document, and provide at future appointments o Ensure daily salt intake < 2300 mg/Azariyah Luhrs  Hyperlipidemia Lab Results  Component Value Date/Time   LDLCALC 58 05/23/2019 07:09 AM   . Pharmacist Clinical Goal(s): o Over the next 90 days, patient will work with PharmD and providers to maintain LDL goal < 100 . Current regimen:  o Diet and exercise management  . Patient self care activities - Over the next 90 days, patient will: o Maintain LDL less than 100  Pre-Diabetes Lab Results  Component Value Date/Time   HGBA1C 5.8 11/18/2019 08:53 AM   HGBA1C 6.1 03/21/2019 09:10 AM   . Pharmacist Clinical Goal(s): o Over the next 90 days, patient will work with PharmD and providers to maintain A1c goal <6.5% . Current regimen:  o Diet and exercise management   . Interventions: o Discussed diet and exercise . Patient self care activities - Over the next 90 days, patient will: o Check blood sugar 1-2 times per week, document, and provide at future appointments o Contact provider with any episodes  of hypoglycemia  Medication management . Pharmacist Clinical Goal(s): o Over the next 90 days, patient will work with PharmD and providers to maintain optimal medication adherence . [facility] . Interventions o Comprehensive medication review performed. o Continue current medication management strategy . Patient self care activities - Over the next 90 days, patient will: o Focus on medication adherence by filling and taking medications appropriately  o Take medications as prescribed o Report any questions or concerns to PharmD and/or provider(s)  Please see past updates related to this goal by clicking on the "Past Updates" button in the selected goal        The patient verbalized understanding of instructions provided today and agreed to receive a mailed copy of patient instruction and/or educational materials.  Telephone follow up appointment with pharmacy team member scheduled for: 09/03/2020  Melvenia Beam Chantal Worthey, PharmD Clinical Pharmacist [facility] [phone]

## 2020-05-03 ENCOUNTER — Ambulatory Visit (INDEPENDENT_AMBULATORY_CARE_PROVIDER_SITE_OTHER): Payer: PPO | Admitting: Internal Medicine

## 2020-05-03 ENCOUNTER — Other Ambulatory Visit: Payer: Self-pay

## 2020-05-03 ENCOUNTER — Encounter: Payer: Self-pay | Admitting: Internal Medicine

## 2020-05-03 VITALS — BP 118/75 | HR 64 | Temp 98.2°F | Resp 18 | Ht 61.0 in | Wt 158.4 lb

## 2020-05-03 DIAGNOSIS — I10 Essential (primary) hypertension: Secondary | ICD-10-CM | POA: Diagnosis not present

## 2020-05-03 DIAGNOSIS — Z0000 Encounter for general adult medical examination without abnormal findings: Secondary | ICD-10-CM

## 2020-05-03 DIAGNOSIS — E785 Hyperlipidemia, unspecified: Secondary | ICD-10-CM | POA: Diagnosis not present

## 2020-05-03 DIAGNOSIS — L509 Urticaria, unspecified: Secondary | ICD-10-CM

## 2020-05-03 DIAGNOSIS — M25512 Pain in left shoulder: Secondary | ICD-10-CM | POA: Diagnosis not present

## 2020-05-03 MED ORDER — CARVEDILOL 6.25 MG PO TABS
6.2500 mg | ORAL_TABLET | Freq: Two times a day (BID) | ORAL | 1 refills | Status: DC
Start: 2020-05-03 — End: 2021-02-25

## 2020-05-03 MED ORDER — SPIRONOLACTONE 25 MG PO TABS
25.0000 mg | ORAL_TABLET | Freq: Every day | ORAL | 1 refills | Status: DC
Start: 2020-05-03 — End: 2020-10-31

## 2020-05-03 NOTE — Patient Instructions (Addendum)
For shoulder pain:  Tylenol  500 mg OTC 2 tabs a day every 8 hours as needed for pain  Naproxen one time a day as needed for pain.  Always take it with food because may cause gastritis and ulcers.  If you notice nausea, stomach pain, change in the color of stools --->  Stop the medicine and let us know    GO TO THE LAB : Get the blood work     Monique [name], Monique [name] back for a checkup in 6 months

## 2020-05-03 NOTE — Progress Notes (Signed)
Pre visit review using our clinic review tool, if applicable. No additional management support is needed unless otherwise documented below in the visit note. 

## 2020-05-03 NOTE — Progress Notes (Signed)
Subjective:    Patient ID: Monique [name], female    DOB: 04-Jan-1941, 79 y.o.   MRN: [id number]  DOS:  05/03/2020 Type of visit - description: CPX  In general feels well. Reports occasionally pain, mostly at the left shoulder when she tries to scratch her back. Mild pain at the neck and hand. Denies any upper or lower extremity paresthesias. History of urticaria, problem is currently silent.  Review of Systems  Other than above, a 14 point review of systems is negative      Past Medical History:  Diagnosis Date  . Allergy   . Anemia    when had uterine fibroids  . Anxiety   . Cataract    very small  . Cholelithiasis   . Closed fracture of base of fifth metacarpal bone of left hand 06/01/2018  . DJD (degenerative joint disease)   . Dry eye syndrome   . Headache(784.0)   . Hyperlipidemia 01/20/2011  . Hypertension 09/29/2006  . Substance abuse ([place])   . Thrombocytopenia ([place]) 08/27/2011   hematology evaluation 08-2011, return prn  . Urticaria   . Vasomotor rhinitis     Past Surgical History:  Procedure Laterality Date  . ABDOMINAL HYSTERECTOMY     no oophorectomy  . APPENDECTOMY    . CHOLECYSTECTOMY N/A 06/06/2016   Procedure: LAPAROSCOPIC CHOLECYSTECTOMY WITH INTRAOPERATIVE CHOLANGIOGRAM;  Surgeon: Johnathan Hausen, MD;  Location: [facility];  Service: General;  Laterality: N/A;  . DILATION AND CURETTAGE OF UTERUS    . EYE SURGERY Bilateral 05/06/2019  . HERNIA REPAIR    . mole removals    . TONSILLECTOMY    . UMBILICAL HERNIA REPAIR      Allergies as of 05/03/2020      Reactions   Bee Venom Anaphylaxis   Doxycycline Hives, Itching   Penicillins Itching, Nausea And Vomiting, Swelling   syncope   Sertraline Other (See Comments)   Dry Mouth   Azithromycin Itching, Rash   Cerumenex [trolamine] Itching, Rash   Climara [estradiol] Itching, Rash   Nickel Rash      Medication List       Accurate as of May 03, 2020 11:59 PM. If you have any questions, ask your  nurse or doctor.        STOP taking these medications   predniSONE 20 MG tablet Commonly known as: Deltasone Stopped by: Kathlene November, MD   Travoprost (BAK Free) 0.004 % Soln ophthalmic solution Commonly known as: TRAVATAN Stopped by: Kathlene November, MD     TAKE these medications   acetaminophen 500 MG tablet Commonly known as: TYLENOL Take 500-1,000 mg by mouth daily as needed.   blood glucose meter kit and supplies Dispense based on patient and insurance preference. Check blood sugars no more than twice daily   brimonidine 0.2 % ophthalmic solution Commonly known as: ALPHAGAN Place into both eyes 3 (three) times daily. Dr. [name]   CALCIUM + D PO Take 2 tablets by mouth daily with lunch. Calcium 648m  Vitamin D 800 Units   carvedilol 6.25 MG tablet Commonly known as: COREG Take 1 tablet (6.25 mg total) by mouth 2 (two) times daily with a meal.   cetirizine 10 MG tablet Commonly known as: ZYRTEC Take 1 tablet (10 mg total) by mouth 2 (two) times daily. What changed: when to take this Changed by: JKathlene November MD   cycloSPORINE 0.05 % ophthalmic emulsion Commonly known as: RESTASIS Place 1 drop into both eyes every 12 (twelve) hours.  Delsym 30 MG/5ML liquid Generic drug: dextromethorphan Take 15 mg by mouth as needed for cough.   diphenhydrAMINE 25 MG tablet Commonly known as: BENADRYL Take 25 mg by mouth every 6 (six) hours as needed for allergies. Reported on 08/06/2015   EPINEPHrine 0.3 mg/0.3 mL Soaj injection Commonly known as: EpiPen 2-Pak Inject 0.3 mLs (0.3 mg total) into the muscle once.   famotidine 20 MG tablet Commonly known as: PEPCID Take 1 tablet (20 mg total) by mouth daily.   fluticasone 50 MCG/ACT nasal spray Commonly known as: FLONASE Place 1-2 sprays into both nostrils daily as needed for allergies or rhinitis.   GLUCOSAMINE CHOND TRIPLE/VIT D PO Take 1 tablet by mouth 2 (two) times daily.   latanoprost 0.005 % ophthalmic solution Commonly  known as: XALATAN Place 1 drop into the left eye at bedtime. Dr. [name]   Magnesium 250 MG Tabs Take by mouth.   montelukast 10 MG tablet Commonly known as: Singulair Take 1 tablet (10 mg total) by mouth at bedtime.   multivitamin with minerals Tabs tablet Take 1 tablet by mouth daily with breakfast.   naproxen sodium 220 MG tablet Commonly known as: ALEVE Take 220 mg by mouth daily as needed.   OneTouch Delica Plus [id number] Misc CHECK BLOOD SUGAR NO MORE THAN TWICE DAILY   OneTouch Ultra test strip Generic drug: glucose blood CHECK BLOOD SUGAR NO MORE THAN TWICE DAILY   SOLUBLE FIBER PO Take by mouth. Great Shape 100% Natural Soluble Fiber   spironolactone 25 MG tablet Commonly known as: ALDACTONE Take 1 tablet (25 mg total) by mouth daily.   Systane Ultra 0.4-0.3 % Soln Generic drug: Polyethyl Glycol-Propyl Glycol Place 1 drop into both eyes daily as needed (dry eyes).   Voltaren 1 % Gel Generic drug: diclofenac Sodium Apply topically 4 (four) times daily.          Objective:   Physical Exam BP 118/75 (BP Location: Left Arm, Patient Position: Sitting, Cuff Size: Small)   Pulse 64   Temp 98.2 F (36.8 C) (Oral)   Resp 18   Ht 5' 1"  (1.549 m)   Wt 158 lb 6 oz (71.8 kg)   SpO2 98%   BMI 29.92 kg/m  General: Well developed, NAD, BMI noted Neck: Range of motion is normal, no TTP of the cervical spine. HEENT:  Normocephalic . Face symmetric, atraumatic Lungs:  CTA B Normal respiratory effort, no intercostal retractions, no accessory muscle use. Heart: RRR,  no murmur.  Abdomen:  Not distended, soft, non-tender. No rebound or rigidity.   Lower extremities: no pretibial edema bilaterally MSK: Shoulders symmetric, no TTP, ROM: Slightly decreased at the left side when she tries to reach back. Skin: Exposed areas without rash. Not pale. Not jaundice Neurologic:  alert & oriented X3.  Speech normal, gait appropriate for age and unassisted Strength  symmetric and appropriate for age. DTRs symmetric Psych: Cognition and judgment appear intact.  Cooperative with normal attention span and concentration.  Behavior appropriate. No anxious or depressed appearing.     Assessment    ASSESSMENT Prediabetes HTN Hyperlipidemia Thrombocytopenia, hematology eval 2013, RTC PRN Allergic rhinitis, urticaria, severe allergic reaction with hives in 2016.-- Dr Donneta Romberg Dry eye syndrome, cataracts s/p implants B, glaucoma,  Psych: --Anxiety-depression: d/t husband's health (lost husband 2015), taking care of fam members --Hoarding  Behavior --MCI dx By neuropsychology-- 2018 Cholecystitis and choledocholithiasis: 05-2016, s/p surgery, ERCP  PLAN: Here for CPX Prediabetes, last A1c very good HTN: Continue spironolactone, carvedilol,  RF sent. Check a CMP. Hyperlipidemia: Off Pravachol as recommended by her allergist due to urticaria. Although I'm checking a FLP I will be reluctant to put her back on statins,? Zetia Urticaria: Currently problem is silent, she is taking high doses of Zyrtec and wonders about decreasing the dose, rec to discuss with her allergist or dermatologist. Left shoulder pain: Has pain mostly at the shoulder, mild pain at the neck but no radicular symptoms per se. She is taking Aleve, GI precautions discussed, encouraged Tylenol, offered PT: Declined, will call when ready. Social: Lost her mother, fortunately she is doing well emotionally. RTC 6 months  In addition to CPX, I spent 20 minutes managing prediabetes, HTN, hyperlipidemia, urticaria and a new problem (left shoulder pain).   This visit occurred during the SARS-CoV-2 public health emergency.  Safety protocols were in place, including screening questions prior to the visit, additional usage of staff PPE, and extensive cleaning of exam room while observing appropriate contact time as indicated for disinfecting solutions.

## 2020-05-04 ENCOUNTER — Encounter: Payer: Self-pay | Admitting: Internal Medicine

## 2020-05-04 LAB — COMPREHENSIVE METABOLIC PANEL
AG Ratio: 2 (calc) (ref 1.0–2.5)
ALT: 15 U/L (ref 6–29)
AST: 19 U/L (ref 10–35)
Albumin: 4.4 g/dL (ref 3.6–5.1)
Alkaline phosphatase (APISO): 71 U/L (ref 37–153)
BUN/Creatinine Ratio: 16 (calc) (ref 6–22)
BUN: 16 mg/dL (ref 7–25)
CO2: 26 mmol/L (ref 20–32)
Calcium: 10.8 mg/dL — ABNORMAL HIGH (ref 8.6–10.4)
Chloride: 102 mmol/L (ref 98–110)
Creat: 1.03 mg/dL — ABNORMAL HIGH (ref 0.60–0.93)
Globulin: 2.2 g/dL (calc) (ref 1.9–3.7)
Glucose, Bld: 90 mg/dL (ref 65–99)
Potassium: 4.7 mmol/L (ref 3.5–5.3)
Sodium: 140 mmol/L (ref 135–146)
Total Bilirubin: 0.7 mg/dL (ref 0.2–1.2)
Total Protein: 6.6 g/dL (ref 6.1–8.1)

## 2020-05-04 LAB — TSH: TSH: 2.59 mIU/L (ref 0.40–4.50)

## 2020-05-04 LAB — LIPID PANEL
Cholesterol: 168 mg/dL (ref <200)
HDL: 48 mg/dL — ABNORMAL LOW (ref >=50)
LDL Cholesterol (Calc): 98 mg/dL (calc)
Non-HDL Cholesterol (Calc): 120 mg/dL (calc) (ref <130)
Total CHOL/HDL Ratio: 3.5 (calc) (ref <5.0)
Triglycerides: 119 mg/dL (ref <150)

## 2020-05-04 NOTE — Assessment & Plan Note (Signed)
Here for CPX Prediabetes, last A1c very good HTN: Continue spironolactone, carvedilol, RF sent. Check a CMP. Hyperlipidemia: Off Pravachol as recommended by her allergist due to urticaria. Although I'm checking a FLP I will be reluctant to put her back on statins,? Zetia Urticaria: Currently problem is silent, she is taking high doses of Zyrtec and wonders about decreasing the dose, rec to discuss with her allergist or dermatologist. Left shoulder pain: Has pain mostly at the shoulder, mild pain at the neck but no radicular symptoms per se. She is taking Aleve, GI precautions discussed, encouraged Tylenol, offered PT: Declined, will call when ready. Social: Lost her mother, fortunately she is doing well emotionally. RTC 6 months

## 2020-05-04 NOTE — Assessment & Plan Note (Signed)
-  Td 2014 -  pneumonia shot  2003, 2008, 2020;  prevnar 2015 -  zostavax 2007; s/p  shingrex  X 2  - covid vax x 2 , to get a booster soon - had a Flu shot   -Female care :  +FH breast ca, MMG 02/2020 per [facility]  History of hysterectomy for a benign condition, no abnormal Pap smears. Sees gyn regularly -DEXA 10-2013 and 2019:  normal , on Ca and Vit D    -CCS: Cscope 10-2005 (-) and 02-2016  @ [place], next 5-10 years but optional per GI letter.  Will discuss next year. -Diet and exercise discussed  -Labs: CMP, FLP TSH

## 2020-05-09 MED ORDER — EZETIMIBE 10 MG PO TABS: 10.0000 mg | ORAL_TABLET | Freq: Every day | ORAL | 3 refills | Status: DC

## 2020-05-09 NOTE — Addendum Note (Signed)
Addended byDamita Dunnings D on: 05/09/2020 08:06 AM   Modules accepted: Orders

## 2020-06-05 ENCOUNTER — Telehealth: Payer: Self-pay | Admitting: Pharmacist

## 2020-06-05 NOTE — Progress Notes (Addendum)
Chronic Care Management Pharmacy Assistant   Name: Monique [name]  MRN: [id number] DOB: Aug 22, 1940  Reason for Encounter: Disease State  Patient Questions:  1.  Have you seen any other providers since your last visit? Yes  2.  Any changes in your medicines or health? Yes    PCP : Colon Branch, MD   Their chronic conditions include: Pre-DM, HTN, HLD, GERD, Allergic Rhinitis, Pain, Constipation, [place]  Office Visits: 05-03-2020 (PCP) Patient presented in the office for her annual physical exam. Reports occasionally pain, mostly at the left shoulder. Was advised to take Tylenol 500 mg OTC and Naproxen once a day for back and shoulder pain. Lab work ordered Notes from [place] state since stopping Pravachol, cholesterol has increased.Recommend Zetia 10 mg 1 p.o. daily. Medication changes: Cetirizine 10 mg tab was increased from one tab daily to two times daily, Ezetimibe 10 mg daily  Allergies:   Allergies  Allergen Reactions   Bee Venom Anaphylaxis   Doxycycline Hives and Itching   Penicillins Itching, Nausea And Vomiting and Swelling    syncope   Sertraline Other (See Comments)    Dry Mouth   Azithromycin Itching and Rash   Cerumenex [Trolamine] Itching and Rash   Climara [Estradiol] Itching and Rash   Nickel Rash    Medications: Outpatient Encounter Medications as of 06/05/2020  Medication Sig Note   acetaminophen (TYLENOL) 500 MG tablet Take 500-1,000 mg by mouth daily as needed.    blood glucose meter kit and supplies Dispense based on patient and insurance preference. Check blood sugars no more than twice daily    brimonidine (ALPHAGAN) 0.2 % ophthalmic solution Place into both eyes 3 (three) times daily. Dr. [name]    Calcium-Vitamin D (CALCIUM + D PO) Take 2 tablets by mouth daily with lunch. Calcium 635m  Vitamin D 800 Units    carvedilol (COREG) 6.25 MG tablet Take 1 tablet (6.25 mg total) by mouth 2 (two) times daily with a meal.    cetirizine (ZYRTEC) 10 MG  tablet Take 1 tablet (10 mg total) by mouth 2 (two) times daily.    cycloSPORINE (RESTASIS) 0.05 % ophthalmic emulsion Place 1 drop into both eyes every 12 (twelve) hours.     dextromethorphan (DELSYM) 30 MG/5ML liquid Take 15 mg by mouth as needed for cough. 04/22/2017: As needed   diclofenac Sodium (VOLTAREN) 1 % GEL Apply topically 4 (four) times daily.    diphenhydrAMINE (BENADRYL) 25 MG tablet Take 25 mg by mouth every 6 (six) hours as needed for allergies. Reported on 08/06/2015 02/04/2016: Liquid, PRN   EPINEPHrine (EPIPEN 2-PAK) 0.3 mg/0.3 mL IJ SOAJ injection Inject 0.3 mLs (0.3 mg total) into the muscle once. (Patient not taking: Reported on 03/09/2018) 03/09/2018: PRN   ezetimibe (ZETIA) 10 MG tablet Take 1 tablet (10 mg total) by mouth daily.    famotidine (PEPCID) 20 MG tablet Take 1 tablet (20 mg total) by mouth daily.    fluticasone (FLONASE) 50 MCG/ACT nasal spray Place 1-2 sprays into both nostrils daily as needed for allergies or rhinitis. 02/04/2016: PRN   Gluc-Chonn-MSM-Boswellia-Vit D (GLUCOSAMINE CHOND TRIPLE/VIT D PO) Take 1 tablet by mouth 2 (two) times daily.    glucose blood (ONETOUCH ULTRA) test strip CHECK BLOOD SUGAR NO MORE THAN TWICE DAILY    Lancets (ONETOUCH DELICA PLUS [id number] MISC CHECK BLOOD SUGAR NO MORE THAN TWICE DAILY    latanoprost (XALATAN) 0.005 % ophthalmic solution Place 1 drop into the left eye at bedtime.  Dr. [name]    Magnesium 250 MG TABS Take by mouth.    Methylcellulose, Laxative, (SOLUBLE FIBER PO) Take by mouth. Great Shape 100% Natural Soluble Fiber    montelukast (SINGULAIR) 10 MG tablet Take 1 tablet (10 mg total) by mouth at bedtime.    Multiple Vitamin (MULTIVITAMIN WITH MINERALS) TABS Take 1 tablet by mouth daily with breakfast.    naproxen sodium (ALEVE) 220 MG tablet Take 220 mg by mouth daily as needed. 10/12/2019: PRN   Polyethyl Glycol-Propyl Glycol (SYSTANE ULTRA) 0.4-0.3 % SOLN Place 1 drop into both eyes daily as needed (dry eyes).      spironolactone (ALDACTONE) 25 MG tablet Take 1 tablet (25 mg total) by mouth daily.    No facility-administered encounter medications on file as of 06/05/2020.    Current Diagnosis: Patient Active Problem List   Diagnosis Date Noted   Urticaria 12/15/2019   MCI (mild cognitive impairment) 02/06/2017   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   GERD (gastroesophageal reflux disease) 06/05/2016   PCP NOTES >>>>>>>>>>>>>>>>>>>>>>>>> 02/04/2016   Severe allergic reaction 08/17/2014   Allergic rhinitis 12/01/2011   Thrombocytopenia ([place]) 08/27/2011   Hyperlipidemia 01/20/2011   Annual physical exam 09/18/2010   Hyperglycemia 09/17/2009   Backache 07/10/2008   Anxiety and depression 01/19/2008   HX OF GALLSTONE 10/20/2006   Essential hypertension 09/29/2006    Goals Addressed   None    Reviewed chart prior to disease state call. Spoke with patient regarding BP  Recent Office Vitals: BP Readings from Last 3 Encounters:  05/03/20 118/75  12/14/19 (!) 153/83  12/12/19 131/75   Pulse Readings from Last 3 Encounters:  05/03/20 64  12/14/19 99  12/12/19 92    Wt Readings from Last 3 Encounters:  05/03/20 158 lb 6 oz (71.8 kg)  12/14/19 164 lb (74.4 kg)  12/12/19 155 lb (70.3 kg)     Kidney Function Lab Results  Component Value Date/Time   CREATININE 1.03 (H) 05/03/2020 10:28 AM   CREATININE 1.04 10/12/2019 04:01 PM   CREATININE 1.18 05/23/2019 01:17 PM   GFR 61.81 10/12/2019 04:01 PM   GFRNONAA 53 (L) 06/09/2016 09:05 AM   GFRAA >60 06/09/2016 09:05 AM    BMP Latest Ref Rng & Units 05/03/2020 10/12/2019 05/23/2019  Glucose 65 - 99 mg/dL 90 94 84  BUN 7 - 25 mg/dL 16 12 20   Creatinine 0.60 - 0.93 mg/dL 1.03(H) 1.04 1.18  BUN/Creat Ratio 6 - 22 (calc) 16 - -  Sodium 135 - 146 mmol/L 140 139 142  Potassium 3.5 - 5.3 mmol/L 4.7 3.9 4.4  Chloride 98 - 110 mmol/L 102 104 105  CO2 20 - 32 mmol/L 26 26 25   Calcium 8.6 - 10.4 mg/dL 10.8(H) 9.8 10.0    Current antihypertensive  regimen:  Carvedilol 6.25 mg BID Spironolactone 25 mg daily  How often are you checking your Blood Pressure? 3-5x per week Patient was asked to check her BP 3-4 times per week  Current home BP readings: 126/64 P-71, 145/82 P-68, 135/71 -P81, 137/73 P-74, 144/76 P-81.  What recent interventions/DTPs have been made by any provider to improve Blood Pressure control since last CPP Visit: none  Any recent hospitalizations or ED visits since last visit with CPP? No   What diet changes have been made to improve Blood Pressure Control?  Patient stated she has almost cut out ice cream and reduced her soda intake drastically. She is drinking more water and cutting back on carbs.  What exercise is being done  to improve your Blood Pressure Control?  Staying busy with her son  Patient reports being in [place], [place] with her son for the holidays. She has been taking Zinc vitamins supplement for immune support. She also stated she has only been taking Cetirizine only once a day. Her allergist advised that if she feels symptoms she can increase increase to twice daily? She has started taking her ezetimibe 10 MG tablet and does not have any issues or side effects from the medication.   Adherence Review: Is the patient currently on ACE/ARB medication? No Does the patient have >5 day gap between last estimated fill dates? No    Follow-Up:  Pharmacist Review   Fanny Skates, [place] Pharmacist Assistant [phone]  4 minutes spent in review, coordination, and documentation.   Reviewed by: [name], PharmD, BCACP Clinical Pharmacist [facility] [phone]

## 2020-09-03 ENCOUNTER — Telehealth: Payer: PPO

## 2020-10-31 ENCOUNTER — Ambulatory Visit (INDEPENDENT_AMBULATORY_CARE_PROVIDER_SITE_OTHER): Payer: PPO | Admitting: Internal Medicine

## 2020-10-31 ENCOUNTER — Other Ambulatory Visit: Payer: Self-pay

## 2020-10-31 ENCOUNTER — Ambulatory Visit: Payer: PPO | Attending: Internal Medicine

## 2020-10-31 DIAGNOSIS — R739 Hyperglycemia, unspecified: Secondary | ICD-10-CM | POA: Diagnosis not present

## 2020-10-31 DIAGNOSIS — D696 Thrombocytopenia, unspecified: Secondary | ICD-10-CM | POA: Diagnosis not present

## 2020-10-31 DIAGNOSIS — E785 Hyperlipidemia, unspecified: Secondary | ICD-10-CM

## 2020-10-31 DIAGNOSIS — I10 Essential (primary) hypertension: Secondary | ICD-10-CM

## 2020-10-31 DIAGNOSIS — Z79899 Other long term (current) drug therapy: Secondary | ICD-10-CM

## 2020-10-31 DIAGNOSIS — Z23 Encounter for immunization: Secondary | ICD-10-CM

## 2020-10-31 LAB — CBC WITH DIFFERENTIAL/PLATELET
Basophils Absolute: 0.1 10*3/uL (ref 0.0–0.1)
Basophils Relative: 1 % (ref 0.0–3.0)
Eosinophils Absolute: 0.3 10*3/uL (ref 0.0–0.7)
Eosinophils Relative: 4 % (ref 0.0–5.0)
HCT: 40.5 % (ref 36.0–46.0)
Hemoglobin: 13.1 g/dL (ref 12.0–15.0)
Lymphocytes Relative: 24 % (ref 12.0–46.0)
Lymphs Abs: 1.5 10*3/uL (ref 0.7–4.0)
MCHC: 32.4 g/dL (ref 30.0–36.0)
MCV: 70.7 fl — ABNORMAL LOW (ref 78.0–100.0)
Monocytes Absolute: 0.5 10*3/uL (ref 0.1–1.0)
Monocytes Relative: 7.6 % (ref 3.0–12.0)
Neutro Abs: 3.9 10*3/uL (ref 1.4–7.7)
Neutrophils Relative %: 63.4 % (ref 43.0–77.0)
Platelets: 127 10*3/uL — ABNORMAL LOW (ref 150.0–400.0)
RBC: 5.73 Mil/uL — ABNORMAL HIGH (ref 3.87–5.11)
RDW: 15.2 % (ref 11.5–15.5)
WBC: 6.2 10*3/uL (ref 4.0–10.5)

## 2020-10-31 LAB — BASIC METABOLIC PANEL
BUN: 18 mg/dL (ref 6–23)
CO2: 27 mEq/L (ref 19–32)
Calcium: 10 mg/dL (ref 8.4–10.5)
Chloride: 103 mEq/L (ref 96–112)
Creatinine, Ser: 0.94 mg/dL (ref 0.40–1.20)
GFR: 57.39 mL/min — ABNORMAL LOW (ref >60.00)
Glucose, Bld: 89 mg/dL (ref 70–99)
Potassium: 4 mEq/L (ref 3.5–5.1)
Sodium: 140 mEq/L (ref 135–145)

## 2020-10-31 LAB — LIPID PANEL
Cholesterol: 155 mg/dL (ref 0–200)
HDL: 49 mg/dL (ref >39.00)
LDL Cholesterol: 80 mg/dL (ref 0–99)
NonHDL: 106.27
Total CHOL/HDL Ratio: 3
Triglycerides: 131 mg/dL (ref 0.0–149.0)
VLDL: 26.2 mg/dL (ref 0.0–40.0)

## 2020-10-31 LAB — HEMOGLOBIN A1C: Hgb A1c MFr Bld: 5.8 % (ref 4.6–6.5)

## 2020-10-31 MED ORDER — SPIRONOLACTONE 25 MG PO TABS
25.0000 mg | ORAL_TABLET | Freq: Every day | ORAL | 1 refills | Status: DC
Start: 2020-10-31 — End: 2021-07-29

## 2020-10-31 NOTE — Progress Notes (Signed)
   Covid-19 Vaccination Clinic  Name:  Monique [name]    MRN: [id number] DOB: [birth year]  10/31/2020  [name] was observed post Covid-19 immunization for 30 minutes based on pre-vaccination screening without incident. She was provided with Vaccine Information Sheet and instruction to access the V-Safe system.   [name] was instructed to call 911 with any severe reactions post vaccine: Marland Kitchen Difficulty breathing  . Swelling of face and throat  . A fast heartbeat  . A bad rash all over body  . Dizziness and weakness   Immunizations Administered    Name Date Dose VIS Date Route   PFIZER Comrnaty(Gray TOP) Covid-19 Vaccine 10/31/2020 10:47 AM 0.3 mL 06/07/2020 Intramuscular   Manufacturer: [employer], [employer]   Lot: [id number]   NDC: [id number]

## 2020-10-31 NOTE — Patient Instructions (Addendum)
You can get your COVID booster at the first floor today.  Check the  blood pressure weekly BP GOAL is between 110/65 and  135/85. If it is consistently higher or lower, let me know   GO TO THE LAB : Get the blood work     [place], [place] back for   a physical exam by November 2022

## 2020-10-31 NOTE — Progress Notes (Signed)
Subjective:    Patient ID: Monique [name], female    DOB: [birth year], 80 y.o.   MRN: [id number]  DOS:  10/31/2020 Type of visit - description: Routine checkup  Since the last office visit she is doing well. We review her ambulatory BPs. She is trying to do better with diet.  Review of Systems Currently with no allergy symptoms. Denies anxiety or depression  Past Medical History:  Diagnosis Date  . Allergy   . Anemia    when had uterine fibroids  . Anxiety   . Cataract    very small  . Cholelithiasis   . Closed fracture of base of fifth metacarpal bone of left hand 06/01/2018  . DJD (degenerative joint disease)   . Dry eye syndrome   . Headache(784.0)   . Hyperlipidemia 01/20/2011  . Hypertension 09/29/2006  . Substance abuse ([place])   . Thrombocytopenia ([place]) 08/27/2011   hematology evaluation 08-2011, return prn  . Urticaria   . Vasomotor rhinitis     Past Surgical History:  Procedure Laterality Date  . ABDOMINAL HYSTERECTOMY     no oophorectomy  . APPENDECTOMY    . CHOLECYSTECTOMY N/A 06/06/2016   Procedure: LAPAROSCOPIC CHOLECYSTECTOMY WITH INTRAOPERATIVE CHOLANGIOGRAM;  Surgeon: Johnathan Hausen, MD;  Location: [facility];  Service: General;  Laterality: N/A;  . DILATION AND CURETTAGE OF UTERUS    . EYE SURGERY Bilateral 05/06/2019  . HERNIA REPAIR    . mole removals    . TONSILLECTOMY    . UMBILICAL HERNIA REPAIR      Allergies as of 10/31/2020      Reactions   Bee Venom Anaphylaxis   Doxycycline Hives, Itching   Penicillins Itching, Nausea And Vomiting, Swelling   syncope   Sertraline Other (See Comments)   Dry Mouth   Azithromycin Itching, Rash   Cerumenex [trolamine] Itching, Rash   Climara [estradiol] Itching, Rash   Nickel Rash      Medication List       Accurate as of Oct 31, 2020 11:59 PM. If you have any questions, ask your nurse or doctor.        acetaminophen 500 MG tablet Commonly known as: TYLENOL Take 500-1,000 mg by mouth daily as  needed.   blood glucose meter kit and supplies Dispense based on patient and insurance preference. Check blood sugars no more than twice daily   brimonidine 0.2 % ophthalmic solution Commonly known as: ALPHAGAN Place into both eyes 3 (three) times daily. Dr. [name]   CALCIUM + D PO Take 2 tablets by mouth daily with lunch. Calcium 658m  Vitamin D 800 Units   carvedilol 6.25 MG tablet Commonly known as: COREG Take 1 tablet (6.25 mg total) by mouth 2 (two) times daily with a meal.   cetirizine 10 MG tablet Commonly known as: ZYRTEC Take 1 tablet (10 mg total) by mouth 2 (two) times daily.   cycloSPORINE 0.05 % ophthalmic emulsion Commonly known as: RESTASIS Place 1 drop into both eyes every 12 (twelve) hours.   dextromethorphan 30 MG/5ML liquid Commonly known as: DELSYM Take 15 mg by mouth as needed for cough.   diclofenac Sodium 1 % Gel Commonly known as: VOLTAREN Apply topically 4 (four) times daily.   diphenhydrAMINE 25 MG tablet Commonly known as: BENADRYL Take 25 mg by mouth every 6 (six) hours as needed for allergies. Reported on 08/06/2015   EPINEPHrine 0.3 mg/0.3 mL Soaj injection Commonly known as: EpiPen 2-Pak Inject 0.3 mLs (0.3 mg total) into the  muscle once.   ezetimibe 10 MG tablet Commonly known as: Zetia Take 1 tablet (10 mg total) by mouth daily.   famotidine 20 MG tablet Commonly known as: PEPCID Take 1 tablet (20 mg total) by mouth daily.   fluticasone 50 MCG/ACT nasal spray Commonly known as: FLONASE Place 1-2 sprays into both nostrils daily as needed for allergies or rhinitis.   GLUCOSAMINE CHOND TRIPLE/VIT D PO Take 1 tablet by mouth 2 (two) times daily.   latanoprost 0.005 % ophthalmic solution Commonly known as: XALATAN Place 1 drop into the left eye at bedtime. Dr. [name]   Magnesium 250 MG Tabs Take by mouth.   montelukast 10 MG tablet Commonly known as: Singulair Take 1 tablet (10 mg total) by mouth at bedtime.    multivitamin with minerals Tabs tablet Take 1 tablet by mouth daily with breakfast.   naproxen sodium 220 MG tablet Commonly known as: ALEVE Take 220 mg by mouth daily as needed.   OneTouch Delica Plus [id number] Misc CHECK BLOOD SUGAR NO MORE THAN TWICE DAILY   OneTouch Ultra test strip Generic drug: glucose blood CHECK BLOOD SUGAR NO MORE THAN TWICE DAILY   Polyethyl Glycol-Propyl Glycol 0.4-0.3 % Soln Place 1 drop into both eyes daily as needed (dry eyes). Sustaine   SOLUBLE FIBER PO Take by mouth. Great Shape 100% Natural Soluble Fiber   spironolactone 25 MG tablet Commonly known as: ALDACTONE Take 1 tablet (25 mg total) by mouth daily.          Objective:   Physical Exam BP (!) 151/85 (BP Location: Right Arm, Patient Position: Sitting, Cuff Size: Large)   Pulse 80   Temp (!) 97.2 F (36.2 C) (Temporal)   Ht 5' 1"  (1.549 m)   Wt 151 lb (68.5 kg)   SpO2 96%   BMI 28.53 kg/m  General:   Well developed, NAD, BMI noted. HEENT:  Normocephalic . Face symmetric, atraumatic Lungs:  CTA B Normal respiratory effort, no intercostal retractions, no accessory muscle use. Heart: RRR,  no murmur.  Lower extremities: no pretibial edema bilaterally  Skin: Not pale. Not jaundice Neurologic:  alert & oriented X3.  Speech normal, gait appropriate for age and unassisted Psych--  Cognition and judgment appear intact.  Cooperative with normal attention span and concentration.  Behavior appropriate. No anxious or depressed appearing.      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  Only one of her multiple readings was in the 170s.  We checked her sphygmomanometer against ours and it seems accurate.  Plan: Continue Carvedilol, Aldactone.  Checking labs High cholesterol: After Pravachol was discontinue and based on FLP from 04-2020, Zetia started.  Check FLP, she is quite reluctant to continue Zetia, benefit discussed.  We agreed that she will stop if so desire.. Hypercalcemia: 10.8, recheck today Polypharmacy: She takes multiple medicines, her BP and cholesterol meds.  Rec to d/w allergist, dermatologist and ophthalmologist the rest of the medications. Preventive care: Encourage COVID vaccine #4 RTC 6 months CPX  This visit occurred during the SARS-CoV-2 public health emergency.  Safety protocols were in place, including screening questions prior to the visit, additional usage of staff PPE, and extensive cleaning of exam room while observing appropriate contact time as indicated for disinfecting solutions.

## 2020-11-01 NOTE — Assessment & Plan Note (Signed)
Prediabetes: Doing well with diet, check A1c HTN: Ambulatory BPs typically in the 130s.  Only one of her multiple readings was in the 170s.  We checked her sphygmomanometer against ours and it seems accurate.  Plan: Continue Carvedilol, Aldactone.  Checking labs High cholesterol: After Pravachol was discontinue and based on FLP from 04-2020, Zetia started.  Check FLP, she is quite reluctant to continue Zetia, benefit discussed.  We agreed that she will stop if so desire.. Hypercalcemia: 10.8, recheck today Polypharmacy: She takes multiple medicines, her BP and cholesterol meds.  Rec to d/w allergist, dermatologist and ophthalmologist the rest of the medications. Preventive care: Encourage COVID vaccine #4 RTC 6 months CPX

## 2020-11-05 ENCOUNTER — Encounter (HOSPITAL_COMMUNITY): Payer: Self-pay

## 2020-11-05 ENCOUNTER — Other Ambulatory Visit: Payer: Self-pay

## 2020-11-05 ENCOUNTER — Emergency Department (HOSPITAL_COMMUNITY): Payer: PPO

## 2020-11-05 ENCOUNTER — Emergency Department (HOSPITAL_COMMUNITY)
Admission: EM | Admit: 2020-11-05 | Discharge: 2020-11-05 | Disposition: A | Discharge status: Home / Self Care | Payer: PPO | Attending: Emergency Medicine | Admitting: Emergency Medicine

## 2020-11-05 ENCOUNTER — Telehealth: Payer: Self-pay

## 2020-11-05 DIAGNOSIS — I10 Essential (primary) hypertension: Secondary | ICD-10-CM | POA: Diagnosis not present

## 2020-11-05 DIAGNOSIS — R079 Chest pain, unspecified: Secondary | ICD-10-CM

## 2020-11-05 DIAGNOSIS — Z79899 Other long term (current) drug therapy: Secondary | ICD-10-CM | POA: Diagnosis not present

## 2020-11-05 DIAGNOSIS — R0789 Other chest pain: Secondary | ICD-10-CM | POA: Diagnosis not present

## 2020-11-05 DIAGNOSIS — R001 Bradycardia, unspecified: Secondary | ICD-10-CM | POA: Diagnosis not present

## 2020-11-05 DIAGNOSIS — R0902 Hypoxemia: Secondary | ICD-10-CM | POA: Diagnosis not present

## 2020-11-05 LAB — TROPONIN I (HIGH SENSITIVITY)
Troponin I (High Sensitivity): 6 ng/L (ref <18)
Troponin I (High Sensitivity): 7 ng/L (ref <18)

## 2020-11-05 LAB — CBC
HCT: 43.5 % (ref 36.0–46.0)
Hemoglobin: 13.3 g/dL (ref 12.0–15.0)
MCH: 23.1 pg — ABNORMAL LOW (ref 26.0–34.0)
MCHC: 30.6 g/dL (ref 30.0–36.0)
MCV: 75.7 fL — ABNORMAL LOW (ref 80.0–100.0)
Platelets: 134 10*3/uL — ABNORMAL LOW (ref 150–400)
RBC: 5.75 MIL/uL — ABNORMAL HIGH (ref 3.87–5.11)
RDW: 14.6 % (ref 11.5–15.5)
WBC: 6 10*3/uL (ref 4.0–10.5)
nRBC: 0 % (ref 0.0–0.2)

## 2020-11-05 LAB — BASIC METABOLIC PANEL
Anion gap: 7 (ref 5–15)
BUN: 14 mg/dL (ref 8–23)
CO2: 28 mmol/L (ref 22–32)
Calcium: 9.4 mg/dL (ref 8.9–10.3)
Chloride: 108 mmol/L (ref 98–111)
Creatinine, Ser: 0.68 mg/dL (ref 0.44–1.00)
GFR, Estimated: >60 mL/min (ref >60)
Glucose, Bld: 97 mg/dL (ref 70–99)
Potassium: 4.1 mmol/L (ref 3.5–5.1)
Sodium: 143 mmol/L (ref 135–145)

## 2020-11-05 NOTE — ED Triage Notes (Signed)
Per EMs- patient reports left upper shoulder pain and left chest wall pain since 09/01/20.  EMS reports that the patient received her second Covid booster the day before and patient reports that the patient has had the same reaction after each Covid injection.

## 2020-11-05 NOTE — Telephone Encounter (Signed)
Nurse Assessment Nurse: Zorita Pang, RN, Deborah Date/Time (Eastern Time): 11/05/2020 9:23:42 AM Confirm and document reason for call. If symptomatic, describe symptoms. ---Caller states that she had a COVID booster last Wednesday and she started having chest pain the night she had it. She took some Tylenol that night with no help. She called the pharmacy and was told to take Advil. No trouble breathing. She states that she is having pain in the middle of her chest. States the pain is mild. No fever. Does the patient have any new or worsening symptoms? ---Yes Will a triage be completed? ---Yes Related visit to physician within the last 2 weeks? ---Yes Does the PT have any chronic conditions? (i.e. diabetes, asthma, this includes High risk factors for pregnancy, etc.) ---Yes List chronic conditions. ---arthritis, hypertension, pre-diabetes, sensitive to medications Is this a behavioral health or substance abuse call? ---No Guidelines Guideline Title Affirmed Question Affirmed Notes Nurse Date/Time Eilene Ghazi Time) Chest Pain [1] Chest pain lasts > 5 minutes AND [2] age > 56 Womble, RN, Neoma Laming 11/05/2020 9:28:38 AM PLEASE NOTE: All timestamps contained within this report are represented as [place] Standard Time. CONFIDENTIALTY NOTICE: This fax transmission is intended only for the addressee. It contains information that is legally privileged, confidential or otherwise protected from use or disclosure. If you are not the intended recipient, you are strictly prohibited from reviewing, disclosing, copying using or disseminating any of this information or taking any action in reliance on or regarding this information. If you have received this fax in error, please notify us immediately by telephone so that we can arrange for its return to [place]. Phone: [phone], Toll-Free: [phone], Fax: [phone] Page: 2 of 2 Call Id: [id number] [place]. Time Eilene Ghazi Time) Disposition Final User 11/05/2020  9:20:55 AM Send to Urgent Brett Fairy 11/05/2020 9:35:14 AM Send To RN Personal Zorita Pang, RN, Deborah 11/05/2020 9:41:14 AM 911 Outcome Documentation Womble, RN, Neoma Laming Reason: Called caller and no answer. 11/05/2020 9:34:20 AM Call EMS 911 Now Yes Zorita Pang, RN, Garrel Ridgel Disagree/Comply Comply Caller Understands Yes PreDisposition 911 Care Advice Given Per Guideline CALL EMS 911 NOW: * Immediate medical attention is needed. You need to hang up and call 911 (or an ambulance)

## 2020-11-05 NOTE — ED Provider Notes (Signed)
Emergency Medicine Provider Triage Evaluation Note  LILYONNA STEIDLE , a 80 y.o. female  was evaluated in triage.  Pt complains of chest pain.  Reports a tightness and soreness all across the front of her chest that has been occurring intermittently since Wednesday.  Reports it feels like sore muscles.  Denies any inciting injury or strenuous activity.  No shortness of breath, lightheadedness, syncope.  Pain worse with movement  Review of Systems  Positive: Chest pain Negative: Shortness of breath, syncope, fever  Physical Exam  BP (!) 175/75 (BP Location: Right Arm)   Pulse 67   Temp 98.5 F (36.9 C) (Oral)   Resp 16   SpO2 98%  Gen:   Awake, no distress   Resp:  Normal effort  MSK:   Moves extremities without difficulty  Other:    Medical Decision Making  Medically screening exam initiated at 11:38 AM.  Appropriate orders placed.  JILLANN CHARETTE was informed that the remainder of the evaluation will be completed by another provider, this initial triage assessment does not replace that evaluation, and the importance of remaining in the ED until their evaluation is complete.     Jacqlyn Larsen, PA-C 11/05/20 1139    Valarie Merino, MD 11/07/20 1610

## 2020-11-05 NOTE — Discharge Instructions (Signed)
50The testing today is normal.  There is no sign of heart or lung problems.  For the pain you are having, try taking Tylenol every 4 hours.  Follow-up with your doctor if needed for ongoing problems.

## 2020-11-05 NOTE — ED Provider Notes (Signed)
[place] DEPT Provider Note   CSN: [id number] Arrival date & time: 11/05/20  1055     History Chief Complaint  Patient presents with  . Chest Pain    Monique [name] is a 80 y.o. female.  HPI She presents for evaluation of ongoing chest discomfort for several days.  She reports that it feels "sore."  She does not have any problems eating.  She denies cough or shortness of breath.  She denies fever, chills, nausea or vomiting.  She is taking her usual medications.  She is somewhat concerned because she had a COVID-vaccine the day before her symptoms started.  She has not had COVID infection.  No other recent problems.  She is taking her usual medications.  There are no other known modifying factors.    Past Medical History:  Diagnosis Date  . Allergy   . Anemia    when had uterine fibroids  . Anxiety   . Cataract    very small  . Cholelithiasis   . Closed fracture of base of fifth metacarpal bone of left hand 06/01/2018  . DJD (degenerative joint disease)   . Dry eye syndrome   . Headache(784.0)   . Hyperlipidemia 01/20/2011  . Hypertension 09/29/2006  . Substance abuse ([place])   . Thrombocytopenia ([place]) 08/27/2011   hematology evaluation 08-2011, return prn  . Urticaria   . Vasomotor rhinitis     Patient Active Problem List   Diagnosis Date Noted  . Urticaria 12/15/2019  . MCI (mild cognitive impairment) 02/06/2017  . 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  . GERD (gastroesophageal reflux disease) 06/05/2016  . PCP NOTES >>>>>>>>>>>>>>>>>>>>>>>>> 02/04/2016  . Severe allergic reaction 08/17/2014  . Allergic rhinitis 12/01/2011  . Thrombocytopenia ([place]) 08/27/2011  . Hyperlipidemia 01/20/2011  . Annual physical exam 09/18/2010  . Hyperglycemia 09/17/2009  . Backache 07/10/2008  . Anxiety and depression 01/19/2008  . HX OF GALLSTONE 10/20/2006  . Essential hypertension 09/29/2006    Past Surgical History:  Procedure Laterality Date  .  ABDOMINAL HYSTERECTOMY     no oophorectomy  . APPENDECTOMY    . CHOLECYSTECTOMY N/A 06/06/2016   Procedure: LAPAROSCOPIC CHOLECYSTECTOMY WITH INTRAOPERATIVE CHOLANGIOGRAM;  Surgeon: Johnathan Hausen, MD;  Location: [facility];  Service: General;  Laterality: N/A;  . DILATION AND CURETTAGE OF UTERUS    . EYE SURGERY Bilateral 05/06/2019  . HERNIA REPAIR    . mole removals    . TONSILLECTOMY    . UMBILICAL HERNIA REPAIR       OB History   No obstetric history on file.     Family History  Problem Relation Age of Onset  . Coronary artery disease Father   . Cancer Father        unsure of type  . Other Father        Guillain- barre syndrome  . Diabetes Other        uncles   . Hypertension Mother   . Anxiety disorder Mother   . Depression Mother   . Prostate cancer Other        grandfather  . Breast cancer Sister 52  . Depression Maternal Uncle   . Colon cancer Neg Hx   . Colon polyps Neg Hx   . Rectal cancer Neg Hx   . Stomach cancer Neg Hx     Social History   Tobacco Use  . Smoking status: Never Smoker  . Smokeless tobacco: Never Used  Vaping Use  . Vaping Use: Never used  Substance Use Topics  . Alcohol use: No  . Drug use: No    Home Medications Prior to Admission medications   Medication Sig Start Date End Date Taking? Authorizing Provider  acetaminophen (TYLENOL) 500 MG tablet Take 500-1,000 mg by mouth daily as needed.    [provider]  blood glucose meter kit and supplies Dispense based on patient and insurance preference. Check blood sugars no more than twice daily 05/25/19   Colon Branch, MD  brimonidine [facility]) 0.2 % ophthalmic solution Place into both eyes 3 (three) times daily. Dr. [name]    [provider]  Calcium-Vitamin D (CALCIUM + D PO) Take 2 tablets by mouth daily with lunch. Calcium 657m  Vitamin D 800 Units    [provider]  carvedilol (COREG) 6.25 MG tablet Take 1 tablet (6.25 mg total) by mouth 2 (two) times  daily with a meal. 05/03/20   PColon Branch MD  cetirizine (ZYRTEC) 10 MG tablet Take 1 tablet (10 mg total) by mouth 2 (two) times daily. 05/03/20   PColon Branch MD  cycloSPORINE (RESTASIS) 0.05 % ophthalmic emulsion Place 1 drop into both eyes every 12 (twelve) hours.     [provider]  dextromethorphan (DELSYM) 30 MG/5ML liquid Take 15 mg by mouth as needed for cough. Patient not taking: Reported on 10/31/2020    [provider]  diclofenac Sodium (VOLTAREN) 1 % GEL Apply topically 4 (four) times daily.    [provider]  diphenhydrAMINE (BENADRYL) 25 MG tablet Take 25 mg by mouth every 6 (six) hours as needed for allergies. Reported on 08/06/2015    [provider]  EPINEPHrine (EPIPEN 2-PAK) 0.3 mg/0.3 mL IJ SOAJ injection Inject 0.3 mLs (0.3 mg total) into the muscle once. 11/27/15   PColon Branch MD  ezetimibe (ZETIA) 10 MG tablet Take 1 tablet (10 mg total) by mouth daily. 05/09/20   PColon Branch MD  famotidine (PEPCID) 20 MG tablet Take 1 tablet (20 mg total) by mouth daily. 03/09/18   PColon Branch MD  fluticasone ([facility] 50 MCG/ACT nasal spray Place 1-2 sprays into both nostrils daily as needed for allergies or rhinitis.    [provider]  Gluc-Chonn-MSM-Boswellia-Vit D (GLUCOSAMINE CHOND TRIPLE/VIT D PO) Take 1 tablet by mouth 2 (two) times daily.    [provider]  glucose blood (ONETOUCH ULTRA) test strip CHECK BLOOD SUGAR NO MORE THAN TWICE DAILY 12/14/19   PColon Branch MD  Lancets ([facility] MccallDELICA PLUS [id number] [place]BLOOD SUGAR NO MORE THAN TWICE DAILY 07/22/19   PColon Branch MD  latanoprost (XALATAN) 0.005 % ophthalmic solution Place 1 drop into the left eye at bedtime. Dr. [name]   [provider]  Magnesium 250 MG TABS Take by mouth.    [provider]  Methylcellulose, Laxative, (SOLUBLE FIBER PO) Take by mouth. Great Shape 100% Natural Soluble Fiber    [provider]  montelukast (SINGULAIR)  10 MG tablet Take 1 tablet (10 mg total) by mouth at bedtime. 10/20/19   PColon Branch MD  Multiple Vitamin (MULTIVITAMIN WITH MINERALS) TABS Take 1 tablet by mouth daily with breakfast.    [provider]  naproxen sodium (ALEVE) 220 MG tablet Take 220 mg by mouth daily as needed.    [provider]  Polyethyl Glycol-Propyl Glycol 0.4-0.3 % SOLN Place 1 drop into both eyes daily as needed (dry eyes). Sustaine    [provider]  spironolactone (ALDACTONE) 25 MG tablet Take 1 tablet (25 mg total) by mouth daily. 10/31/20   Colon Branch, MD    Allergies    Bee venom, Doxycycline, Penicillins, Sertraline, Azithromycin, Cerumenex [trolamine], Climara [estradiol], and Nickel  Review of Systems   Review of Systems  All other systems reviewed and are negative.   Physical Exam Updated Vital Signs BP (!) 176/72   Pulse 72   Temp 98.5 F (36.9 C) (Oral)   Resp 18   Ht _0  (1.549 m)   Wt 68 kg   SpO2 100%   BMI 28.33 kg/m   Physical Exam Vitals and nursing note reviewed.  Constitutional:      General: She is not in acute distress.    Appearance: She is well-developed. She is obese. She is not ill-appearing, toxic-appearing or diaphoretic.  HENT:     Head: Normocephalic and atraumatic.     Right Ear: External ear normal.     Left Ear: External ear normal.     Nose: No congestion or rhinorrhea.     Mouth/Throat:     Pharynx: No oropharyngeal exudate or posterior oropharyngeal erythema.  Eyes:     Conjunctiva/sclera: Conjunctivae normal.     Pupils: Pupils are equal, round, and reactive to light.  Neck:     Trachea: Phonation normal.  Cardiovascular:     Rate and Rhythm: Normal rate and regular rhythm.     Heart sounds: Normal heart sounds.  Pulmonary:     Effort: Pulmonary effort is normal.     Breath sounds: Normal breath sounds.  Abdominal:     Palpations: Abdomen is soft.     Tenderness: There is no abdominal tenderness.  Musculoskeletal:         General: Normal range of motion.     Cervical back: Normal range of motion and neck supple.  Skin:    General: Skin is warm and dry.  Neurological:     Mental Status: She is alert and oriented to person, place, and time.     Cranial Nerves: No cranial nerve deficit.     Sensory: No sensory deficit.     Motor: No abnormal muscle tone.     Coordination: Coordination normal.  Psychiatric:        Mood and Affect: Mood normal.        Behavior: Behavior normal.        Thought Content: Thought content normal.        Judgment: Judgment normal.     ED Results / Procedures / Treatments   Labs (all labs ordered are listed, but only abnormal results are displayed) Labs Reviewed  CBC - Abnormal; Notable for the following components:      Result Value   RBC 5.75 (*)    MCV 75.7 (*)    MCH 23.1 (*)    Platelets 134 (*)    All other components within normal limits  BASIC METABOLIC PANEL  TROPONIN I (HIGH SENSITIVITY)  TROPONIN I (HIGH SENSITIVITY)    EKG EKG Interpretation  Date/Time:  Monday Nov 05 2020 11:11:16 EDT Ventricular Rate:  64 PR Interval:  158 QRS Duration: 80 QT Interval:  404 QTC Calculation: 416 R Axis:   4 Text Interpretation: Normal sinus rhythm Low voltage QRS Cannot rule out Anterior infarct , age undetermined Abnormal ECG since last tracing no significant change Confirmed by Daleen Bo [phone]) on 11/05/2020 1:24:27 PM   Radiology DG Chest 2 View  Result Date: 11/05/2020 CLINICAL  DATA:  Chest pain EXAM: CHEST - 2 VIEW COMPARISON:  June 04, 2016 chest radiograph and chest CT FINDINGS: No edema or airspace opacity. Heart is borderline enlarged with pulmonary vascularity normal. No adenopathy. No pneumothorax. No bone lesions. IMPRESSION: Borderline cardiac enlargement.  No edema or airspace opacity. Electronically Signed   By: Lowella Grip III M.D.   On: 11/05/2020 13:00    Procedures Procedures   Medications Ordered in ED Medications - No data to  display  ED Course  I have reviewed the triage vital signs and the nursing notes.  Pertinent labs & imaging results that were available during my care of the patient were reviewed by me and considered in my medical decision making (see chart for details).    MDM Rules/Calculators/A&P                           Patient Vitals for the past 24 hrs:  BP Temp Temp src Pulse Resp SpO2 Height Weight  11/05/20 1458 (!) 176/72 -- -- 72 18 100 % -- --  11/05/20 1356 (!) 148/114 -- -- 70 18 100 % -- --  11/05/20 1312 -- -- -- -- -- -- _0  (1.549 m) 68 kg  11/05/20 1255 (!) 157/79 -- -- 64 18 96 % -- --  11/05/20 1104 (!) 175/75 98.5 F (36.9 C) Oral 67 16 98 % -- --  11/05/20 1102 -- -- -- -- -- 100 % -- --    1:40 PM Reevaluation with update and discussion. After initial assessment and treatment, an updated evaluation reveals no change in clinical status, findings cussed with the patient all questions were answered. Daleen Bo   Medical Decision Making:  This patient is presenting for evaluation of nonspecific chest discomfort, which does require a range of treatment options, and is a complaint that involves a moderate risk of morbidity and mortality. The differential diagnoses include pneumonia, ACS, pulmonary disorder. I decided to review old records, and in summary elderly female presenting with ongoing symptoms for several days following COVID-vaccine.  Complains of nonspecific.  She has been healthy recently, and has a fairly benign past medical history. I did not require additional historical information from anyone.  Clinical Laboratory Tests Ordered, included CBC, Metabolic panel and Troponin. Review indicates normal. Radiologic Tests Ordered, included chest x-ray.  I independently Visualized: Radiograph images, which show normal  Cardiac Monitor Tracing which shows normal sinus rhythm  Critical Interventions-clinical evaluation, laboratory testing, chest x-ray, observation  reassessment  After These Interventions, the Patient was reevaluated and was found stable for discharge.  Doubt ACS, PE or pneumonia.  Doubt significant complication from recent COVID-vaccine  CRITICAL CARE-no Performed by: Daleen Bo  Nursing Notes Reviewed/ Care Coordinated Applicable Imaging Reviewed Interpretation of Laboratory Data incorporated into ED treatment  The patient appears reasonably screened and/or stabilized for discharge and I doubt any other medical condition or other [facility] requiring further screening, evaluation, or treatment in the ED at this time prior to discharge.  Plan: Home Medications-continue usual, use Tylenol for pain; Home Treatments-rest, gradual advance activity; return here if the recommended treatment, does not improve the symptoms; Recommended follow up-PCP, PRN     Final Clinical Impression(s) / ED Diagnoses Final diagnoses:  Nonspecific chest pain    Rx / DC Orders ED Discharge Orders    None       Daleen Bo, MD 11/05/20 [phone]

## 2020-11-06 ENCOUNTER — Other Ambulatory Visit (HOSPITAL_BASED_OUTPATIENT_CLINIC_OR_DEPARTMENT_OTHER): Payer: Self-pay

## 2020-11-06 MED ORDER — PFIZER-BIONT COVID-19 VAC-TRIS 30 MCG/0.3ML IM SUSP
INTRAMUSCULAR | 0 refills | Status: DC
  Filled 2020-11-06: qty 0.3, 1d supply, fill #0

## 2020-11-09 DIAGNOSIS — J300 Vasomotor rhinitis: Secondary | ICD-10-CM | POA: Diagnosis not present

## 2020-11-09 DIAGNOSIS — L509 Urticaria, unspecified: Secondary | ICD-10-CM | POA: Diagnosis not present

## 2020-11-13 ENCOUNTER — Telehealth: Payer: Self-pay | Admitting: Pharmacist

## 2020-11-13 NOTE — Chronic Care Management (AMB) (Signed)
Chronic Care Management Pharmacy Assistant   Name: Monique [name]  MRN: [id number] DOB: 28-Oct-1940   Reason for Encounter: Disease State Hypertension    Recent office visits:  10/31/2020 Kathlene November, MD (PCP) Seen for routine checkup. Started on Zetia. Follow up in 6 months.  Recent consult visits:  None noted  Hospital visits:  Medication Reconciliation was completed by comparing discharge summary, patient's EMR and Pharmacy list, and upon discussion with patient.  Admitted to the hospital on 11/05/2020 due to chest pain. Discharge date was 11/05/2020. Discharged from [place]?Medications Started at [facility] Discharge:?? No new medications  Medication Changes at Hospital Discharge: No changes to medicaitons  Medications Discontinued at Hospital Discharge: No medications have been discontinued  Medications that remain the same after Hospital Discharge:??  -All other medications will remain the same.    Medications: Outpatient Encounter Medications as of 11/13/2020  Medication Sig Note  . acetaminophen (TYLENOL) 500 MG tablet Take 500-1,000 mg by mouth daily as needed.   . blood glucose meter kit and supplies Dispense based on patient and insurance preference. Check blood sugars no more than twice daily   . brimonidine (ALPHAGAN) 0.2 % ophthalmic solution Place into both eyes 3 (three) times daily. Dr. [name]   . Calcium-Vitamin D (CALCIUM + D PO) Take 2 tablets by mouth daily with lunch. Calcium 634m  Vitamin D 800 Units   . carvedilol (COREG) 6.25 MG tablet Take 1 tablet (6.25 mg total) by mouth 2 (two) times daily with a meal.   . cetirizine (ZYRTEC) 10 MG tablet Take 1 tablet (10 mg total) by mouth 2 (two) times daily.   .Marland KitchenCOVID-19 mRNA Vac-TriS, Pfizer, (PFIZER-BIONT COVID-19 VAC-TRIS) SUSP injection Inject into the muscle.   . cycloSPORINE (RESTASIS) 0.05 % ophthalmic emulsion Place 1 drop into both eyes every 12 (twelve) hours.    .Marland Kitchen dextromethorphan (DELSYM) 30 MG/5ML liquid Take 15 mg by mouth as needed for cough. (Patient not taking: Reported on 10/31/2020) 04/22/2017: As needed  . diclofenac Sodium (VOLTAREN) 1 % GEL Apply topically 4 (four) times daily.   . diphenhydrAMINE (BENADRYL) 25 MG tablet Take 25 mg by mouth every 6 (six) hours as needed for allergies. Reported on 08/06/2015 02/04/2016: Liquid, PRN  . EPINEPHrine (EPIPEN 2-PAK) 0.3 mg/0.3 mL IJ SOAJ injection Inject 0.3 mLs (0.3 mg total) into the muscle once. 03/09/2018: PRN  . ezetimibe (ZETIA) 10 MG tablet Take 1 tablet (10 mg total) by mouth daily.   . famotidine (PEPCID) 20 MG tablet Take 1 tablet (20 mg total) by mouth daily.   . fluticasone (FLONASE) 50 MCG/ACT nasal spray Place 1-2 sprays into both nostrils daily as needed for allergies or rhinitis. 02/04/2016: PRN  . Gluc-Chonn-MSM-Boswellia-Vit D (GLUCOSAMINE CHOND TRIPLE/VIT D PO) Take 1 tablet by mouth 2 (two) times daily.   .Marland Kitchenglucose blood (ONETOUCH ULTRA) test strip CHECK BLOOD SUGAR NO MORE THAN TWICE DAILY   . Lancets (ONETOUCH DELICA PLUS [id number] MISC CHECK BLOOD SUGAR NO MORE THAN TWICE DAILY   . latanoprost (XALATAN) 0.005 % ophthalmic solution Place 1 drop into the left eye at bedtime. Dr. [name]  . Magnesium 250 MG TABS Take by mouth.   . Methylcellulose, Laxative, (SOLUBLE FIBER PO) Take by mouth. Great Shape 100% Natural Soluble Fiber   . montelukast (SINGULAIR) 10 MG tablet Take 1 tablet (10 mg total) by mouth at bedtime.   . Multiple Vitamin (MULTIVITAMIN WITH MINERALS) TABS Take 1 tablet  by mouth daily with breakfast.   . naproxen sodium (ALEVE) 220 MG tablet Take 220 mg by mouth daily as needed. 10/12/2019: PRN  . Polyethyl Glycol-Propyl Glycol 0.4-0.3 % SOLN Place 1 drop into both eyes daily as needed (dry eyes). Sustaine   . spironolactone (ALDACTONE) 25 MG tablet Take 1 tablet (25 mg total) by mouth daily.    No facility-administered encounter medications on file as of 11/13/2020.    Reviewed chart prior to disease state call. Spoke with patient regarding BP  Recent Office Vitals: BP Readings from Last 3 Encounters:  11/05/20 (!) 163/83  10/31/20 (!) 151/85  05/03/20 118/75   Pulse Readings from Last 3 Encounters:  11/05/20 80  10/31/20 80  05/03/20 64    Wt Readings from Last 3 Encounters:  11/05/20 149 lb 14.6 oz (68 kg)  10/31/20 151 lb (68.5 kg)  05/03/20 158 lb 6 oz (71.8 kg)     Kidney Function Lab Results  Component Value Date/Time   CREATININE 0.68 11/05/2020 11:46 AM   CREATININE 0.94 10/31/2020 10:24 AM   CREATININE 1.03 (H) 05/03/2020 10:28 AM   GFR 57.39 (L) 10/31/2020 10:24 AM   GFRNONAA >60 11/05/2020 11:46 AM   GFRAA >60 06/09/2016 09:05 AM    BMP Latest Ref Rng & Units 11/05/2020 10/31/2020 05/03/2020  Glucose 70 - 99 mg/dL 97 89 90  BUN 8 - 23 mg/dL 14 18 16   Creatinine 0.44 - 1.00 mg/dL 0.68 0.94 1.03(H)  BUN/Creat Ratio 6 - 22 (calc) - - 16  Sodium 135 - 145 mmol/L 143 140 140  Potassium 3.5 - 5.1 mmol/L 4.1 4.0 4.7  Chloride 98 - 111 mmol/L 108 103 102  CO2 22 - 32 mmol/L 28 27 26   Calcium 8.9 - 10.3 mg/dL 9.4 10.0 10.8(H)    . Current antihypertensive regimen:  o Spironolactone 25 mg take 1 tab daily o Carvedilol 6.25 mg take 1 tab twice daily  . How often are you checking your Blood Pressure? 1-2x per week   . Current home BP readings:   130/70  . What recent interventions/DTPs have been made by any provider to improve Blood Pressure control since last CPP Visit: None noted  . Any recent hospitalizations or ED visits since last visit with CPP? Yes   . What diet changes have been made to improve Blood Pressure Control?  o Patient states she has improved her diet.  . What exercise is being done to improve your Blood Pressure Control?  o Patient states she has been doing some exercise in her home twice a week.  Patient states has been having some swelling on both feet this past week and has been taking tylenol to  control it. Advised to PCP for further eval.  Adherence Review: Is the patient currently on ACE/ARB medication? None noted Does the patient have >5 day gap between last estimated fill dates? None noted   Star Rating Drugs: None noted  [facility] Clinical Pharmacist Assistant [phone]

## 2020-11-20 NOTE — Telephone Encounter (Cosign Needed)
Called patient to schedule CCM appointment. Patient scheduled appointment for phone visit on 12/05/20 at 10:00 AM.

## 2020-12-05 ENCOUNTER — Ambulatory Visit (INDEPENDENT_AMBULATORY_CARE_PROVIDER_SITE_OTHER): Payer: PPO | Admitting: Pharmacist

## 2020-12-05 DIAGNOSIS — R739 Hyperglycemia, unspecified: Secondary | ICD-10-CM

## 2020-12-05 DIAGNOSIS — E785 Hyperlipidemia, unspecified: Secondary | ICD-10-CM

## 2020-12-05 DIAGNOSIS — I10 Essential (primary) hypertension: Secondary | ICD-10-CM | POA: Diagnosis not present

## 2020-12-05 NOTE — Patient Instructions (Signed)
[name] It was a pleasure speaking with you today. Please feel free to contact me if you have any questions or concerns. Below is information regarding you health goals.   Keep up the good work!  Cherre Robins, PharmD Clinical Pharmacist [facility] [phone]  Visit Information  PATIENT GOALS: Goals Addressed            This Visit's Progress   . Chronic Care Management Pharmacy Care Plan       CARE PLAN ENTRY (see longitudinal plan of care for additional care plan information)  Current Barriers:  . Chronic Disease Management support, education, and care coordination needs related to Pre-DM, HTN, HLD, GERD, Allergic Rhinitis, Pain, Constipation, [place]   Hypertension BP Readings from Last 3 Encounters:  11/05/20 (!) 163/83  10/31/20 (!) 151/85  05/03/20 118/75   . Pharmacist Clinical Goal(s): o Over the next 90 days, patient will work with PharmD and providers to maintain BP goal <140/90 (home blood pressure readings have been at goal) . Current regimen:   Carvedilol 6.25mg  twice a day  Spironolactone 25mg  daily each morning . Interventions: o Reviewed home blood pressure readings; Continue to check blood pressure 2 to 3 times per week (30 minutes to 1 hour after taking blood pressure medications) . Patient self care activities - Over the next 90 days, patient will: o Check blood pressure 2 to 3 times per week, document, and provide at future appointments o Ensure daily salt intake < 2300 mg/day o Continue current regimen for blood pressure  Hyperlipidemia Lab Results  Component Value Date/Time   LDLCALC 80 10/31/2020 10:24 AM   LDLCALC 98 05/03/2020 10:28 AM   . Pharmacist Clinical Goal(s): o Over the next 90 days, patient will work with PharmD and providers to maintain LDL goal < 100 . Current regimen:  o Ezetimibe 10mg  daily . Patient self care activities - Over the next 90 days, patient will: o Maintain LDL less  than 100 o Continue current therapy for lowering cholesterol  o Increase exercise as able to goal of 150 minutes per week (consider [employer] program)   Pre-Diabetes Lab Results  Component Value Date/Time   HGBA1C 5.8 10/31/2020 10:24 AM   HGBA1C 5.8 11/18/2019 08:53 AM   . Pharmacist Clinical Goal(s): o Over the next 90 days, patient will work with PharmD and providers to maintain A1c goal <6.5% . Current regimen:  o Diet and exercise management   . Interventions: o Discussed diet and exercise o Reviewed home blood glucose readings and reviewed goals  - Fasting blood glucose goal (before meals) = 80 to 130 - Blood glucose goal after a meal = less than 180  . Patient self care activities - Over the next 90 days, patient will: o Check blood sugar 1 or 2 times per week, document, and provide at future appointments o Contact provider with any episodes of hypoglycemia o Continue to limit intake of sugar and starchy foods  Health maintenance:  . Pharmacist Clinical Goal(s): o Over the next 90 days, patient will work with PharmD and providers to schedule needed services to maintain health . Interventions o Provided at patient request information for podiatrist (foot doctor)  . Patient self care activities - Over the next 90 days, patient will: o Contact Dr Landis Martins with [facility] in [facility] for appointment - [phone]; please contact our office is referral is needed.   Medication management . Pharmacist Clinical Goal(s): o Over the  next 90 days, patient will work with PharmD and providers to maintain optimal medication adherence . Current pharmacy: [facility] . Interventions o Comprehensive medication review performed. o Continue current medication management strategy . Patient self care activities - Over the next 90 days, patient will: o Focus on medication adherence by filling and taking medications appropriately  o Take medications as prescribed o Report  any questions or concerns to PharmD and/or provider(s)  Please see past updates related to this goal by clicking on the "Past Updates" button in the selected goal        The patient verbalized understanding of instructions, educational materials, and care plan provided today and agreed to receive a mailed copy of patient instructions, educational materials, and care plan.   Telephone follow up appointment with care management team member scheduled for: October 2022

## 2020-12-05 NOTE — Chronic Care Management (AMB) (Signed)
Chronic Care Management Pharmacy Note  12/05/2020 Name:  Monique [name] MRN:  [id number] DOB:  [birth year]  Summary: Patient is doing well; home BP and BG at goals Reviewed med list and updated; Reviewed adherence - good  Recommendations/Changes made from today's visit: Provided education on BG and BP goals Provided information for podiatrist at patient request.   Plan: Follow up home BG and BP number in 2 months  Subjective: Monique [name] is an 80 y.o. year old female who is a primary patient of Paz, Nolon Rod, MD.  The CCM team was consulted for assistance with disease management and care coordination needs.    Engaged with patient by telephone for follow up visit in response to provider referral for pharmacy case management and/or care coordination services.   Consent to Services:  The patient was given information about Chronic Care Management services, agreed to services, and gave verbal consent prior to initiation of services.  Please see initial visit note for detailed documentation.   Patient Care Team: [name], MD as PCP - General [place] Peers, [place] as Referring Physician (Optometry) Sidney Ace, MD as Referring Physician (Allergy) Essie Hart, MD as Referring Physician (Obstetrics and Gynecology) Rhea Belton, Carie Caddy, MD as Consulting Physician (Gastroenterology) Archer Asa, MD as Consulting Physician (Psychiatry) Sharyn Lull, Elvin So, MD as Referring Physician (Dermatology) [facility] (Orthopedic Surgery) Henrene Pastor, PharmD (Pharmacist)  Recent office visits: 10/31/2020 - Dr Drue Novel (PCP) Seen for routine checkup. Continued Zetia. Follow up in 6 months.  Recent consult visits: None in last 6 months  Hospital visits: 11/05/2020 - ED Visit at [facility] for chest pain. No medication changes noted.    Objective:  Lab Results  Component Value Date   CREATININE 0.68 11/05/2020   CREATININE 0.94 10/31/2020   CREATININE 1.03 (H) 05/03/2020     Lab Results  Component Value Date   HGBA1C 5.8 10/31/2020   Last diabetic Eye exam:  Lab Results  Component Value Date/Time   HMDIABEYEEXA No Retinopathy 02/17/2017 12:00 AM    Last diabetic Foot exam: No results found for: HMDIABFOOTEX      Component Value Date/Time   CHOL 155 10/31/2020 1024   TRIG 131.0 10/31/2020 1024   HDL 49.00 10/31/2020 1024   CHOLHDL 3 10/31/2020 1024   VLDL 26.2 10/31/2020 1024   LDLCALC 80 10/31/2020 1024   LDLCALC 98 05/03/2020 1028    Hepatic Function Latest Ref Rng & Units 05/03/2020 05/23/2019 03/21/2019  Total Protein 6.1 - 8.1 g/dL 6.6 - 6.8  Albumin 3.5 - 5.2 g/dL - - 4.4  AST 10 - 35 U/L 19 20 23   ALT 6 - 29 U/L 15 17 19   Alk Phosphatase 39 - 117 U/L - - 67  Total Bilirubin 0.2 - 1.2 mg/dL 0.7 - 0.7  Bilirubin, Direct 0.0 - 0.3 mg/dL - - -    Lab Results  Component Value Date/Time   TSH 2.59 05/03/2020 10:28 AM   TSH 1.42 09/10/2016 02:55 PM    CBC Latest Ref Rng & Units 11/05/2020 10/31/2020 10/12/2019  WBC 4.0 - 10.5 K/uL 6.0 6.2 6.8  Hemoglobin 12.0 - 15.0 g/dL 40.3 47.4 25.9  Hematocrit 36.0 - 46.0 % 43.5 40.5 39.4  Platelets 150 - 400 K/uL 134(L) 127.0(L) 134.0(L)    No results found for: VD25OH  Clinical ASCVD: No  The ASCVD Risk score Denman George DC Jr., et al., 2013) failed to calculate for the following reasons:   The 2013 ASCVD risk score is only  valid for ages 2 to 75    Other:  DEXA 03/16/2018 - lowest T-Score was -0.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 femoral neck  Social History   Tobacco Use  Smoking Status Never Smoker  Smokeless Tobacco Never Used   BP Readings from Last 3 Encounters:  11/05/20 (!) 163/83  10/31/20 (!) 151/85  05/03/20 118/75   Pulse Readings from Last 3 Encounters:  11/05/20 80  10/31/20 80  05/03/20 64   Wt Readings from Last 3 Encounters:  11/05/20 149 lb 14.6 oz (68 kg)  10/31/20 151 lb (68.5 kg)  05/03/20 158 lb 6 oz (71.8 kg)    Assessment: Review of patient past medical history, allergies,  medications, health status, including review of consultants reports, laboratory and other test data, was performed as part of comprehensive evaluation and provision of chronic care management services.   SDOH:  (Social Determinants of Health) assessments and interventions performed:  SDOH Interventions   Flowsheet Row Most Recent Value  SDOH Interventions   Financial Strain Interventions Intervention Not Indicated  Physical Activity Interventions Other (Comments), [employer]  [discussed increasing exerise to goal of 150 minutes per week.]      CCM Care Plan  Allergies  Allergen Reactions  . Bee Venom Anaphylaxis  . Doxycycline Hives and Itching  . Penicillins Itching, Nausea And Vomiting and Swelling    syncope  . Azithromycin Itching and Rash  . Cerumenex [Trolamine] Itching and Rash  . Climara [Estradiol] Itching and Rash  . Nickel Rash  . Sertraline Other (See Comments)    Dry Mouth    Medications Reviewed Today    Reviewed by Henrene Pastor, PharmD (Pharmacist) on 12/05/20 at 1053  Med List Status: <None>  Medication Order Taking? Sig Documenting Provider Last Dose Status Informant  acetaminophen (TYLENOL) 500 MG tablet [id number] Yes Take 500-1,000 mg by mouth daily as needed. [provider] Taking Active   blood glucose meter kit and supplies [id number] Yes Dispense based on patient and insurance preference. Check blood sugars no more than twice daily Paz, Nolon Rod, MD Taking Active   brimonidine [facility]) 0.2 % ophthalmic solution [id number] Yes Place into both eyes 3 (three) times daily. Dr. Harriette Bouillon [provider] Taking Active   Calcium-Vitamin D (CALCIUM + D PO) [id number] Yes Take 2 tablets by mouth daily with lunch. Calcium 600mg   Vitamin D 800 Units [provider] Taking Active Self  carvedilol (COREG) 6.25 MG tablet [id number] Yes Take 1 tablet (6.25 mg total) by mouth 2 (two) times daily with a meal. [name], MD Taking Active   cetirizine  (ZYRTEC) 10 MG tablet [id number] Yes Take 10 mg by mouth daily. [name], MD Taking Active   Coenzyme Q10-Vitamin E (QUNOL ULTRA COQ10) 100-150 MG-UNIT CAPS [id number] Yes Take 1 tablet by mouth daily. [provider] Taking Active   COVID-19 mRNA Vac-TriS, Pfizer, (PFIZER-BIONT COVID-19 VAC-TRIS) SUSP injection [id number]  Inject into the muscle. Judyann Munson, MD  Active   cycloSPORINE (RESTASIS) 0.05 % ophthalmic emulsion [id number] Yes Place 1 drop into both eyes every 12 (twelve) hours.  [provider] Taking Active Self  dextromethorphan (DELSYM) 30 MG/5ML liquid [id number] No Take 15 mg by mouth as needed for cough.  Patient not taking: No sig reported   [provider] Not Taking Active            Med Note Clydie Braun, Younes Degeorge B   Wed Dec 05, 2020 10:29 AM)    diclofenac Sodium (VOLTAREN) 1 % GEL  [id number] Yes Apply topically 4 (four) times daily. If needed [provider] Taking Active   diphenhydrAMINE (BENADRYL) 12.5 MG/5ML liquid [id number] Yes Take 25 mg by mouth every 6 (six) hours as needed for allergies. Reported on 08/06/2015 [provider] Taking Active Self           Med Note Clydie Braun, Karmela Bram B   Wed Dec 05, 2020 10:25 AM)    EPINEPHrine (EPIPEN 2-PAK) 0.3 mg/0.3 mL IJ SOAJ injection [id number] Yes Inject 0.3 mLs (0.3 mg total) into the muscle once. [name], MD Taking Active Self           Med Note (CANTER, KAYLYN D   Tue Mar 09, 2018 10:08 AM) PRN  ezetimibe (ZETIA) 10 MG tablet [id number] Yes Take 1 tablet (10 mg total) by mouth daily. [name], MD Taking Active   famotidine (PEPCID) 20 MG tablet [id number] Yes Take 1 tablet (20 mg total) by mouth daily. [name], MD Taking Active   fluticasone [facility]) 50 MCG/ACT nasal spray [id number] Yes Place 1-2 sprays into both nostrils daily as needed for allergies or rhinitis. [provider] Taking Active Self           Med Note Clydie Braun, Glenna Durand   Wed Dec 05, 2020 10:31 AM)     Gluc-Chonn-MSM-Boswellia-Vit D (GLUCOSAMINE CHOND TRIPLE/VIT D PO) [id number] Yes Take 2 tablets by mouth daily. [provider] Taking Active   glucose blood (ONETOUCH ULTRA) test strip [id number] Yes CHECK BLOOD SUGAR NO MORE THAN TWICE DAILY [name], MD Taking Active   Lancets [facility] [place] [facility] [place]) MISC [id number] Yes CHECK BLOOD SUGAR NO MORE THAN TWICE DAILY Paz, Nolon Rod, MD Taking Active   latanoprost (XALATAN) 0.005 % ophthalmic solution [id number] Yes Place 1 drop into the left eye at bedtime. Dr. Harriette Bouillon [provider] Taking Active   Magnesium 250 MG TABS [id number] Yes Take 1 tablet by mouth daily. [provider] Taking Active   Methylcellulose, Laxative, (SOLUBLE FIBER PO) [id number] Yes Take by mouth. Benefiber [provider] Taking Active   montelukast (SINGULAIR) 10 MG tablet [id number] Yes Take 1 tablet (10 mg total) by mouth at bedtime. [name], MD Taking Active   Multiple Vitamin (MULTIVITAMIN WITH MINERALS) TABS [id number] Yes Take 1 tablet by mouth daily with breakfast. [provider] Taking Active Self  naproxen sodium (ALEVE) 220 MG tablet [id number] Yes Take 220 mg by mouth daily as needed. [provider] Taking Active            Med Note Clydie Braun, Reighan Hipolito B   Wed Dec 05, 2020 10:08 AM)    Polyethyl Glycol-Propyl Glycol 0.4-0.3 % SOLN [id number] Yes Place 1 drop into both eyes daily as needed (dry eyes). Sustaine [provider] Taking Active Self  spironolactone (ALDACTONE) 25 MG tablet [id number] Yes Take 1 tablet (25 mg total) by mouth daily. [name], MD Taking Active           Patient Active Problem List   Diagnosis Date Noted  . Urticaria 12/15/2019  . MCI (mild cognitive impairment) 02/06/2017  . 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  . GERD (gastroesophageal reflux disease) 06/05/2016  . PCP NOTES >>>>>>>>>>>>>>>>>>>>>>>>> 02/04/2016  . Severe allergic reaction 08/17/2014  . Allergic rhinitis  12/01/2011  . Thrombocytopenia (HCC) 08/27/2011  . Hyperlipidemia 01/20/2011  . Annual physical exam 09/18/2010  . Hyperglycemia 09/17/2009  . Backache 07/10/2008  . Anxiety and depression 01/19/2008  . HX OF GALLSTONE 10/20/2006  .  Essential hypertension 09/29/2006    Immunization History  Administered Date(s) Administered  . Fluad Quad(high Dose 65+) 03/21/2019  . Influenza Split 05/02/2014  . Influenza Whole 05/14/2007, 04/09/2009, 04/03/2010  . Influenza, High Dose Seasonal PF 04/06/2015, 04/08/2018, 02/24/2020  . Influenza, Seasonal, Injecte, Preservative Fre 04/12/2013  . Influenza-Unspecified 08/17/2017  . PFIZER Comirnaty(Gray Top)Covid-19 Tri-Sucrose Vaccine 10/31/2020  . PFIZER(Purple Top)SARS-COV-2 Vaccination 09/16/2019, 10/11/2019, 05/05/2020  . Pneumococcal Conjugate-13 11/30/2013  . Pneumococcal Polysaccharide-23 06/30/2001, 05/14/2007, 09/08/2018  . Td 10/08/2004  . Tdap 04/12/2013  . Zoster Recombinat (Shingrix) 03/11/2018, 05/20/2018  . Zoster, Live 06/30/2005    Conditions to be addressed/monitored: HTN, HLD, Anxiety, Depression and pre DM; GERD; urticaria / allergies  Care Plan : General Pharmacy (Adult)  Updates made by Henrene Pastor, PHARMD since 12/05/2020 12:00 AM    Problem: CHL AMB "PATIENT-SPECIFIC PROBLEM"     Long-Range Goal: Medication and chronic care management   Start Date: 12/05/2020  This Visit's Progress: On track  Note:   Current Barriers:  . Chronic Disease Management support, education, and care coordination needs related to Pre-DM, HTN, HLD, GERD, Allergic Rhinitis, Glaucoma . Unable to maintain control of HTN  Pharmacist Clinical Goal(s):  Marland Kitchen Over the next 180 days, patient will achieve adherence to monitoring guidelines and medication adherence to achieve therapeutic efficacy . achieve control of HTN as evidenced by BP <140/90 . maintain control of pre DM and hyperlipidemia as evidenced by A1c <6.5% and LDL <100  through  collaboration with PharmD and provider.   Interventions: . 1:1 collaboration with [name], MD regarding development and update of comprehensive plan of care as evidenced by provider attestation and co-signature . Inter-disciplinary care team collaboration (see longitudinal plan of care) . Comprehensive medication review performed; medication list updated in electronic medical record  Pre - Diabetes: . Controlled . Current treatment: diet and exercise  . Current glucose readings:  o fasting glucose: 79 to 111 o post prandial glucose: 138 to 150 . Denies hypoglycemic/hyperglycemic symptoms . Current meal patterns: o Limiting intake of sugary drinks except 1 or 2 sodas per month o Limiting intake of CHO - no french fries, 1/2 sweet potato once per week; little rice . Current exercise: 1 or 2 times per week - walking . Interventions:  o Discussed diet and exercise - Continue to limit intake of sugar and starchy foods o Reviewed home blood glucose readings and reviewed goals  - Fasting blood glucose goal (before meals) = 80 to 130 - Blood glucose goal after a meal = less than 180 o Recommended patient continue to check blood glucose 1 or 2 times per week, document, and provide at future appointments o Contact provider with any episodes of hypoglycemia  Hypertension: . Variable - BP has been above goal in office recently but home BP is at goal; BP goal <140/90 (home blood pressure readings have been at goal) . No hypotensive/hypertensive symptoms; no CP since ED visit 11/05/2020 - thought to be related to reflux not cardiac. . Recent home BP readings:  o 140/77; 139/73; 118/77 . Current regimen:   Carvedilol 6.25mg  twice a day  Spironolactone 25mg  daily each morning . Interventions: o Reviewed home blood pressure readings; Continue to check blood pressure 2 to 3 times per week (30 minutes to 1 hour after taking blood pressure medications) o Ensure daily salt intake < 2300  mg/day o Continue current regimen for blood pressure  Hyperlipidemia: . Controlled; LDL goal <100 . Current treatment: o Ezetimibe 10mg  daily .  Previous therapy: atorvastatin - stopped by dermatologist due to suspected was causing urticaria  . Interventions:  o Maintain LDL less than 100 o Continue current therapy for lowering cholesterol  o Increase exercise as able to goal of 150 minutes per week (consider Silver Sneakers program)    Health maintenance:  . Patient states she would like to see podiatrist for possible ingrown toe nails; Her mother has seen Asencion Islam and she would like to see her but cannot remember the office she is in. . Interventions:  o Provided at patient request information for podiatrist (foot doctor)  o Contact Dr Asencion Islam with [facility] in [facility] for appointment - [phone]; please contact our office is referral is needed.   Medication management . [facility] . Interventions o Comprehensive medication review performed. o Continue current medication management strategy  Patient Goals/Self-Care Activities . Over the next 180 days, patient will:  take medications as prescribed, check glucose 2 to 3 times per week, document, and provide at future appointments, check blood pressure 2 to 3 times per week, document, and provide at future appointments, and target a minimum of 150 minutes of moderate intensity exercise weekly  Follow Up Plan: Telephone follow up appointment with care management team member scheduled for:  03/2021         Medication Assistance: None required.  Patient affirms current coverage meets needs.  Patient's preferred pharmacy is:  [id number] [place] [facility] [place], [place] - [address] AT [address] [place] [address] Phone: [phone] Fax: [phone]   Follow Up:  Patient agrees to Care Plan and Follow-up.  Plan: Telephone follow up  appointment with care management team member scheduled for:  03/2021  Henrene Pastor, PharmD Clinical Pharmacist [facility] [phone]

## 2020-12-19 ENCOUNTER — Other Ambulatory Visit: Payer: Self-pay

## 2020-12-19 ENCOUNTER — Encounter (HOSPITAL_COMMUNITY): Payer: Self-pay

## 2020-12-19 ENCOUNTER — Ambulatory Visit (HOSPITAL_COMMUNITY)
Admission: EM | Admit: 2020-12-19 | Discharge: 2020-12-19 | Disposition: A | Discharge status: Home / Self Care | Payer: PPO | Attending: Student | Admitting: Student

## 2020-12-19 DIAGNOSIS — R1030 Lower abdominal pain, unspecified: Secondary | ICD-10-CM | POA: Insufficient documentation

## 2020-12-19 LAB — POCT URINALYSIS DIPSTICK, ED / UC
Bilirubin Urine: NEGATIVE
Glucose, UA: NEGATIVE mg/dL
Ketones, ur: NEGATIVE mg/dL
Leukocytes,Ua: NEGATIVE
Nitrite: NEGATIVE
Protein, ur: NEGATIVE mg/dL
Specific Gravity, Urine: 1.01 (ref 1.005–1.030)
Urobilinogen, UA: 0.2 mg/dL (ref 0.0–1.0)
pH: 5 (ref 5.0–8.0)

## 2020-12-19 MED ORDER — SULFAMETHOXAZOLE-TRIMETHOPRIM 800-160 MG PO TABS: 1.0000 | ORAL_TABLET | Freq: Two times a day (BID) | ORAL | 0 refills | Status: AC

## 2020-12-19 MED ORDER — NITROFURANTOIN MONOHYD MACRO 100 MG PO CAPS: 100.0000 mg | ORAL_CAPSULE | Freq: Two times a day (BID) | ORAL | 0 refills | Status: DC

## 2020-12-19 NOTE — ED Notes (Signed)
Sulfamethoxazole-Trimethoprim, will be change to Nitrofurantoin, du to interaction level 1 Sulfa and Spironolactone.   [facility] Pharmacist called, porvider, Phillip Heal, PA reported about the drug-drug interaction.

## 2020-12-19 NOTE — ED Triage Notes (Signed)
Pt reports lower back and abdominal pain x 1 day. Denies dysuria, diarrhea.

## 2020-12-19 NOTE — Discharge Instructions (Addendum)
-  Bactrim (trimethoprim-sulfamethoxazole) twice daily for 7 days. -Make sure to drink plenty of fluids -If your symptoms get worse, like abdominal pain, back pain, new fever/chills, new urinary symptoms like burning when you pee-head straight to the emergency department. -If your symptoms do not get better, but they also do not get worse-follow-up with your primary care provider.

## 2020-12-19 NOTE — ED Provider Notes (Addendum)
[place]    CSN: [id number] Arrival date & time: 12/19/20  0935      History   Chief Complaint Chief Complaint  Patient presents with   Abdominal Pain   Back Pain    HPI Monique [name] is a 80 y.o. female presenting with vague complaint abdominal pain and back pain x1 day.  Medical history anemia, allergies, cholelithiasis, degenerative disc disease, headaches. H/o appendectomy and cholecystectomy. This patient is a poor historian.  Patient endorses right-sided back pain with movement for 1 day, nontraumatic.  Denies overuse.  Also endorses generalized crampy abdominal pain worse in the suprapubic area for the last day.  She is unable to identify any triggers for the abdominal pain, states this is constant and sharp.  States it is not improved or worsened by eating.  Denies nausea, vomiting, diarrhea, constipation. Last BM was 1 day ago and she is still passing gas.  Denies urinary symptoms including dysuria, frequency, urgency, hematuria, incontinence. Denies pain shooting down legs, denies numbness in arms/legs, denies weakness in arms/legs, denies saddle anesthesia, denies bowel/bladder incontinence, denies urinary retention, denies constipation.   HPI  Past Medical History:  Diagnosis Date   Allergy    Anemia    when had uterine fibroids   Anxiety    Cataract    very small   Cholelithiasis    Closed fracture of base of fifth metacarpal bone of left hand 06/01/2018   DJD (degenerative joint disease)    Dry eye syndrome    Headache(784.0)    Hyperlipidemia 01/20/2011   Hypertension 09/29/2006   Substance abuse ([place])    Thrombocytopenia ([place]) 08/27/2011   hematology evaluation 08-2011, return prn   Urticaria    Vasomotor rhinitis     Patient Active Problem List   Diagnosis Date Noted   Urticaria 12/15/2019   MCI (mild cognitive impairment) 02/06/2017   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   GERD (gastroesophageal reflux disease) 06/05/2016   PCP NOTES  >>>>>>>>>>>>>>>>>>>>>>>>> 02/04/2016   Severe allergic reaction 08/17/2014   Allergic rhinitis 12/01/2011   Thrombocytopenia ([place]) 08/27/2011   Hyperlipidemia 01/20/2011   Annual physical exam 09/18/2010   Hyperglycemia 09/17/2009   Backache 07/10/2008   Anxiety and depression 01/19/2008   HX OF GALLSTONE 10/20/2006   Essential hypertension 09/29/2006    Past Surgical History:  Procedure Laterality Date   ABDOMINAL HYSTERECTOMY     no oophorectomy   APPENDECTOMY     CHOLECYSTECTOMY N/A 06/06/2016   Procedure: LAPAROSCOPIC CHOLECYSTECTOMY WITH INTRAOPERATIVE CHOLANGIOGRAM;  Surgeon: Johnathan Hausen, MD;  Location: [facility];  Service: General;  Laterality: N/A;   DILATION AND CURETTAGE OF UTERUS     EYE SURGERY Bilateral 05/06/2019   HERNIA REPAIR     mole removals     TONSILLECTOMY     UMBILICAL HERNIA REPAIR      OB History   No obstetric history on file.      Home Medications    Prior to Admission medications   Medication Sig Start Date End Date Taking? Authorizing Provider  sulfamethoxazole-trimethoprim (BACTRIM DS) 800-160 MG tablet Take 1 tablet by mouth 2 (two) times daily for 7 days. 12/19/20 12/26/20 Yes Hazel Sams, PA-C  acetaminophen (TYLENOL) 500 MG tablet Take 500-1,000 mg by mouth daily as needed.    [provider]  blood glucose meter kit and supplies Dispense based on patient and insurance preference. Check blood sugars no more than twice daily 05/25/19   Colon Branch, MD  brimonidine [facility]) 0.2 %  ophthalmic solution Place into both eyes 3 (three) times daily. Dr. [name]    [provider]  Calcium-Vitamin D (CALCIUM + D PO) Take 2 tablets by mouth daily with lunch. Calcium 684m  Vitamin D 800 Units    [provider]  carvedilol (COREG) 6.25 MG tablet Take 1 tablet (6.25 mg total) by mouth 2 (two) times daily with a meal. 05/03/20   PColon Branch MD  cetirizine (ZYRTEC) 10 MG tablet Take 10 mg by mouth daily. 05/03/20   PColon Branch MD  Coenzyme Q10-Vitamin E (QUNOL ULTRA COQ10) 100-150 MG-UNIT CAPS Take 1 tablet by mouth daily.    [provider]  COVID-19 mRNA Vac-TriS, Pfizer, (PFIZER-BIONT COVID-19 VAC-TRIS) SUSP injection Inject into the muscle. 10/31/20   SCarlyle Basques MD  cycloSPORINE (RESTASIS) 0.05 % ophthalmic emulsion Place 1 drop into both eyes every 12 (twelve) hours.     [provider]  dextromethorphan (DELSYM) 30 MG/5ML liquid Take 15 mg by mouth as needed for cough. Patient not taking: No sig reported    [provider]  diclofenac Sodium (VOLTAREN) 1 % GEL Apply topically 4 (four) times daily. If needed    [provider]  diphenhydrAMINE (BENADRYL) 12.5 MG/5ML liquid Take 25 mg by mouth every 6 (six) hours as needed for allergies. Reported on 08/06/2015    [provider]  EPINEPHrine (EPIPEN 2-PAK) 0.3 mg/0.3 mL IJ SOAJ injection Inject 0.3 mLs (0.3 mg total) into the muscle once. 11/27/15   PColon Branch MD  ezetimibe (ZETIA) 10 MG tablet Take 1 tablet (10 mg total) by mouth daily. 05/09/20   PColon Branch MD  famotidine (PEPCID) 20 MG tablet Take 1 tablet (20 mg total) by mouth daily. 03/09/18   PColon Branch MD  fluticasone ([facility] 50 MCG/ACT nasal spray Place 1-2 sprays into both nostrils daily as needed for allergies or rhinitis.    [provider]  Gluc-Chonn-MSM-Boswellia-Vit D (GLUCOSAMINE CHOND TRIPLE/VIT D PO) Take 2 tablets by mouth daily.    [provider]  glucose blood (ONETOUCH ULTRA) test strip CHECK BLOOD SUGAR NO MORE THAN TWICE DAILY 12/14/19   PColon Branch MD  Lancets ([facility] HospitalDELICA PLUS [id number] MDaltonBLOOD SUGAR NO MORE THAN TWICE DAILY 07/22/19   PColon Branch MD  latanoprost (XALATAN) 0.005 % ophthalmic solution Place 1 drop into the left eye at bedtime. Dr. [name]   [provider]  Magnesium 250 MG TABS Take 1 tablet by mouth daily.    [provider]  Methylcellulose, Laxative, (SOLUBLE  FIBER PO) Take by mouth. Benefiber    [provider]  montelukast (SINGULAIR) 10 MG tablet Take 1 tablet (10 mg total) by mouth at bedtime. 10/20/19   PColon Branch MD  Multiple Vitamin (MULTIVITAMIN WITH MINERALS) TABS Take 1 tablet by mouth daily with breakfast.    [provider]  naproxen sodium (ALEVE) 220 MG tablet Take 220 mg by mouth daily as needed.    [provider]  Polyethyl Glycol-Propyl Glycol 0.4-0.3 % SOLN Place 1 drop into both eyes daily as needed (dry eyes). Sustaine    [provider]  spironolactone (ALDACTONE) 25 MG tablet Take 1 tablet (25 mg total) by mouth daily. 10/31/20   PColon Branch MD    Family History Family History  Problem Relation Age of Onset   Coronary artery disease Father    Cancer Father        unsure of  type   Other Father        Guillain- barre syndrome   Diabetes Other        uncles    Hypertension Mother    Anxiety disorder Mother    Depression Mother    Prostate cancer Other        grandfather   Breast cancer Sister 99   Depression Maternal Uncle    Colon cancer Neg Hx    Colon polyps Neg Hx    Rectal cancer Neg Hx    Stomach cancer Neg Hx     Social History Social History   Tobacco Use   Smoking status: Never   Smokeless tobacco: Never  Vaping Use   Vaping Use: Never used  Substance Use Topics   Alcohol use: No   Drug use: No     Allergies   Bee venom, Doxycycline, Penicillins, Bee pollen, Azithromycin, Cerumenex [trolamine], Climara [estradiol], Nickel, and Sertraline   Review of Systems Review of Systems  Constitutional:  Negative for appetite change, chills, diaphoresis and fever.  Respiratory:  Negative for shortness of breath.   Cardiovascular:  Negative for chest pain.  Gastrointestinal:  Positive for abdominal pain. Negative for blood in stool, constipation, diarrhea, nausea and vomiting.  Genitourinary:  Negative for decreased urine volume, difficulty urinating, dysuria, flank  pain, frequency, genital sores, hematuria and urgency.  Musculoskeletal:  Positive for back pain.  Neurological:  Negative for dizziness, weakness and light-headedness.  All other systems reviewed and are negative.   Physical Exam Triage Vital Signs ED Triage Vitals [12/19/20 1018]  Enc Vitals Group     BP      Pulse      Resp      Temp      Temp src      SpO2      Weight      Height      Head Circumference      Peak Flow      Pain Score 6     Pain Loc      Pain Edu?      Excl. in [place]?    No data found.  Updated Vital Signs BP 138/83 (BP Location: Right Arm)   Pulse 79   Temp 98.3 F (36.8 C) (Oral)   Resp 18   SpO2 95%   Visual Acuity Right Eye Distance:   Left Eye Distance:   Bilateral Distance:    Right Eye Near:   Left Eye Near:    Bilateral Near:     Physical Exam Vitals reviewed.  Constitutional:      General: She is not in acute distress.    Appearance: Normal appearance. She is not ill-appearing.  HENT:     Head: Normocephalic and atraumatic.     Mouth/Throat:     Mouth: Mucous membranes are moist.     Comments: Moist mucous membranes Eyes:     Extraocular Movements: Extraocular movements intact.     Pupils: Pupils are equal, round, and reactive to light.  Cardiovascular:     Rate and Rhythm: Normal rate and regular rhythm.     Heart sounds: Normal heart sounds.  Pulmonary:     Effort: Pulmonary effort is normal.     Breath sounds: Normal breath sounds. No wheezing, rhonchi or rales.  Abdominal:     General: Bowel sounds are normal. There is no distension.     Palpations: Abdomen is soft. There is no mass.     Tenderness:  There is abdominal tenderness in the suprapubic area. There is no right CVA tenderness, left CVA tenderness, guarding or rebound.     Comments: Patient does have reproducible right lumbar paraspinous muscle tenderness, no CVAT.  No spinous deformity, tenderness, stepoff. No saddle anesthesia.   Skin:    General: Skin is  warm.     Capillary Refill: Capillary refill takes less than 2 seconds.     Comments: Good skin turgor  Neurological:     General: No focal deficit present.     Mental Status: She is alert and oriented to person, place, and time.  Psychiatric:        Mood and Affect: Mood normal.        Behavior: Behavior normal.     UC Treatments / Results  Labs (all labs ordered are listed, but only abnormal results are displayed) Labs Reviewed  POCT URINALYSIS DIPSTICK, ED / UC - Abnormal; Notable for the following components:      Result Value   Hgb urine dipstick SMALL (*)    All other components within normal limits  URINE CULTURE    EKG   Radiology No results found.  Procedures Procedures (including critical care time)  Medications Ordered in UC Medications - No data to display  Initial Impression / Assessment and Plan / UC Course  I have reviewed the triage vital signs and the nursing notes.  Pertinent labs & imaging results that were available during my care of the patient were reviewed by me and considered in my medical decision making (see chart for details).    This patient is a 80 year old female presenting with possible cystitis.  Afebrile, nontachycardic.  She does have suprapubic pressure but no CVAT.  History of both cholecystectomy and appendectomy in the past.  UA with trace blood, negative nitrite and leuk. Culture sent.  Despite normal UA, patient with concerning symptoms for urinary tract disease. Patient with multiple antibiotic allergies including penicillins, so we will treat with Macrobid as below.  ED return precautions discussed. Patient verbalizes understanding and agreement.   Coding this visit a Level 4 based on review of past labs, ordering and interpretation of labs today, and prescription drug management.  Final Clinical Impressions(s) / UC Diagnoses   Final diagnoses:  Lower abdominal pain     Discharge Instructions      -Bactrim  (trimethoprim-sulfamethoxazole) twice daily for 7 days. -Make sure to drink plenty of fluids -If your symptoms get worse, like abdominal pain, back pain, new fever/chills, new urinary symptoms like burning when you pee-head straight to the emergency department. -If your symptoms do not get better, but they also do not get worse-follow-up with your primary care provider.     ED Prescriptions     Medication Sig Dispense Auth. Provider   sulfamethoxazole-trimethoprim (BACTRIM DS) 800-160 MG tablet Take 1 tablet by mouth 2 (two) times daily for 7 days. 14 tablet Hazel Sams, PA-C      PDMP not reviewed this encounter.   Hazel Sams, PA-C 12/19/20 1058    Hazel Sams, PA-C 12/19/20 1141

## 2020-12-20 LAB — URINE CULTURE: Culture: NO GROWTH

## 2020-12-24 ENCOUNTER — Telehealth: Payer: Self-pay | Admitting: Internal Medicine

## 2020-12-24 NOTE — Telephone Encounter (Signed)
Copied from [place] [phone]. Topic: Medicare AWV >> Dec 24, 2020 11:51 AM Harris-Coley, Hannah Beat wrote: Reason for CRM: Left message for patient to schedule Annual Wellness Visit.  Please schedule with Health Nurse Advisor Augustine Radar. at [facility].

## 2020-12-27 ENCOUNTER — Ambulatory Visit (INDEPENDENT_AMBULATORY_CARE_PROVIDER_SITE_OTHER): Payer: PPO

## 2020-12-27 VITALS — Ht 61.0 in | Wt 149.0 lb

## 2020-12-27 DIAGNOSIS — Z1231 Encounter for screening mammogram for malignant neoplasm of breast: Secondary | ICD-10-CM | POA: Diagnosis not present

## 2020-12-27 DIAGNOSIS — Z0000 Encounter for general adult medical examination without abnormal findings: Secondary | ICD-10-CM | POA: Diagnosis not present

## 2020-12-27 DIAGNOSIS — Z780 Asymptomatic menopausal state: Secondary | ICD-10-CM

## 2020-12-27 NOTE — Patient Instructions (Signed)
Ms. Biller , Thank you for taking time to complete your Medicare Wellness Visit. I appreciate your ongoing commitment to your health goals. Please review the following plan we discussed and let me know if I can assist you in the future.   Screening recommendations/referrals: Colonoscopy: No longer required Mammogram: Completed 03/21/2020-Due 03/21/2021 Bone Density: Ordered today. Someone will be calling you to schedule. Recommended yearly ophthalmology/optometry visit for glaucoma screening and checkup Recommended yearly dental visit for hygiene and checkup  Vaccinations: Influenza vaccine: Up to date Pneumococcal vaccine: Up to date Tdap vaccine: Up to date-Due 04/13/2023 Shingles vaccine: Completed vaccines   Covid-19:Up to date  Advanced directives: Please bring a copy for your chart  Conditions/risks identified: See problem list  Next appointment: Follow up in one year for your annual wellness visit 01/02/2022 @ 2:20   Preventive Care 16 Years and Older, Female Preventive care refers to lifestyle choices and visits with your health care provider that can promote health and wellness. What does preventive care include? A yearly physical exam. This is also called an annual well check. Dental exams once or twice a year. Routine eye exams. Ask your health care provider how often you should have your eyes checked. Personal lifestyle choices, including: Daily care of your teeth and gums. Regular physical activity. Eating a healthy diet. Avoiding tobacco and drug use. Limiting alcohol use. Practicing safe sex. Taking low-dose aspirin every day. Taking vitamin and mineral supplements as recommended by your health care provider. What happens during an annual well check? The services and screenings done by your health care provider during your annual well check will depend on your age, overall health, lifestyle risk factors, and family history of disease. Counseling  Your health care  provider may ask you questions about your: Alcohol use. Tobacco use. Drug use. Emotional well-being. Home and relationship well-being. Sexual activity. Eating habits. History of falls. Memory and ability to understand (cognition). Work and work Statistician. Reproductive health. Screening  You may have the following tests or measurements: Height, weight, and BMI. Blood pressure. Lipid and cholesterol levels. These may be checked every 5 years, or more frequently if you are over 4 years old. Skin check. Lung cancer screening. You may have this screening every year starting at age 44 if you have a 30-pack-year history of smoking and currently smoke or have quit within the past 15 years. Fecal occult blood test (FOBT) of the stool. You may have this test every year starting at age 31. Flexible sigmoidoscopy or colonoscopy. You may have a sigmoidoscopy every 5 years or a colonoscopy every 10 years starting at age 22. Hepatitis C blood test. Hepatitis B blood test. Sexually transmitted disease (STD) testing. Diabetes screening. This is done by checking your blood sugar (glucose) after you have not eaten for a while (fasting). You may have this done every 1-3 years. Bone density scan. This is done to screen for osteoporosis. You may have this done starting at age 35. Mammogram. This may be done every 1-2 years. Talk to your health care provider about how often you should have regular mammograms. Talk with your health care provider about your test results, treatment options, and if necessary, the need for more tests. Vaccines  Your health care provider may recommend certain vaccines, such as: Influenza vaccine. This is recommended every year. Tetanus, diphtheria, and acellular pertussis (Tdap, Td) vaccine. You may need a Td booster every 10 years. Zoster vaccine. You may need this after age 64. Pneumococcal 13-valent conjugate (PCV13)  vaccine. One dose is recommended after age  66. Pneumococcal polysaccharide (PPSV23) vaccine. One dose is recommended after age 89. Talk to your health care provider about which screenings and vaccines you need and how often you need them. This information is not intended to replace advice given to you by your health care provider. Make sure you discuss any questions you have with your health care provider. Document Released: 07/13/2015 Document Revised: 03/05/2016 Document Reviewed: 04/17/2015 Elsevier Interactive Patient Education  2017 [place] Prevention in the Home Falls can cause injuries. They can happen to people of all ages. There are many things you can do to make your home safe and to help prevent falls. What can I do on the outside of my home? Regularly fix the edges of walkways and driveways and fix any cracks. Remove anything that might make you trip as you walk through a door, such as a raised step or threshold. Trim any bushes or trees on the path to your home. Use bright outdoor lighting. Clear any walking paths of anything that might make someone trip, such as rocks or tools. Regularly check to see if handrails are loose or broken. Make sure that both sides of any steps have handrails. Any raised decks and porches should have guardrails on the edges. Have any leaves, snow, or ice cleared regularly. Use sand or salt on walking paths during winter. Clean up any spills in your garage right away. This includes oil or grease spills. What can I do in the bathroom? Use night lights. Install grab bars by the toilet and in the tub and shower. Do not use towel bars as grab bars. Use non-skid mats or decals in the tub or shower. If you need to sit down in the shower, use a plastic, non-slip stool. Keep the floor dry. Clean up any water that spills on the floor as soon as it happens. Remove soap buildup in the tub or shower regularly. Attach bath mats securely with double-sided non-slip rug tape. Do not have throw  rugs and other things on the floor that can make you trip. What can I do in the bedroom? Use night lights. Make sure that you have a light by your bed that is easy to reach. Do not use any sheets or blankets that are too big for your bed. They should not hang down onto the floor. Have a firm chair that has side arms. You can use this for support while you get dressed. Do not have throw rugs and other things on the floor that can make you trip. What can I do in the kitchen? Clean up any spills right away. Avoid walking on wet floors. Keep items that you use a lot in easy-to-reach places. If you need to reach something above you, use a strong step stool that has a grab bar. Keep electrical cords out of the way. Do not use floor polish or wax that makes floors slippery. If you must use wax, use non-skid floor wax. Do not have throw rugs and other things on the floor that can make you trip. What can I do with my stairs? Do not leave any items on the stairs. Make sure that there are handrails on both sides of the stairs and use them. Fix handrails that are broken or loose. Make sure that handrails are as long as the stairways. Check any carpeting to make sure that it is firmly attached to the stairs. Fix any carpet that is loose  or worn. Avoid having throw rugs at the top or bottom of the stairs. If you do have throw rugs, attach them to the floor with carpet tape. Make sure that you have a light switch at the top of the stairs and the bottom of the stairs. If you do not have them, ask someone to add them for you. What else can I do to help prevent falls? Wear shoes that: Do not have high heels. Have rubber bottoms. Are comfortable and fit you well. Are closed at the toe. Do not wear sandals. If you use a stepladder: Make sure that it is fully opened. Do not climb a closed stepladder. Make sure that both sides of the stepladder are locked into place. Ask someone to hold it for you, if  possible. Clearly mark and make sure that you can see: Any grab bars or handrails. First and last steps. Where the edge of each step is. Use tools that help you move around (mobility aids) if they are needed. These include: Canes. Walkers. Scooters. Crutches. Turn on the lights when you go into a dark area. Replace any light bulbs as soon as they burn out. Set up your furniture so you have a clear path. Avoid moving your furniture around. If any of your floors are uneven, fix them. If there are any pets around you, be aware of where they are. Review your medicines with your doctor. Some medicines can make you feel dizzy. This can increase your chance of falling. Ask your doctor what other things that you can do to help prevent falls. This information is not intended to replace advice given to you by your health care provider. Make sure you discuss any questions you have with your health care provider. Document Released: 04/12/2009 Document Revised: 11/22/2015 Document Reviewed: 07/21/2014 Elsevier Interactive Patient Education  2017 [employer].

## 2020-12-27 NOTE — Progress Notes (Signed)
Subjective:   Monique [name] is a 80 y.o. female who presents for Medicare Annual (Subsequent) preventive examination.   I connected with Monique [name] today by telephone and verified that I am speaking with the correct person using two identifiers. Location patient: home Location provider: work Persons participating in the virtual visit: patient, Marine scientist.    I discussed the limitations, risks, security and privacy concerns of performing an evaluation and management service by telephone and the availability of in person appointments. I also discussed with the patient that there may be a patient responsible charge related to this service. The patient expressed understanding and verbally consented to this telephonic visit.    Interactive audio and video telecommunications were attempted between this provider and patient, however failed, due to patient having technical difficulties OR patient did not have access to video capability.  We continued and completed visit with audio only.  Some vital signs may be absent or patient reported.   Time Spent with patient on telephone encounter: 25 minutes  Review of Systems     Cardiac Risk Factors include: advanced age (>75mn, >>28women);dyslipidemia;hypertension     Objective:    Today's Vitals   12/27/20 1417  Weight: 149 lb (67.6 kg)  Height: _0  (1.549 m)   Body mass index is 28.15 kg/m.  Advanced Directives 12/27/2020 11/05/2020 08/22/2019 08/19/2018 08/17/2017 06/07/2016 03/06/2016  Does Patient Have a Medical Advance Directive? Yes No _1   Type of AParamed[place]of [place] will - [place] will [place] will [place] will [place] will Living will;Healthcare Power of Attorney  Does patient want to make changes to medical advance directive? - - No - Patient declined No - Patient declined No -  Patient declined No - Patient declined -  Copy of [place] Chart? No - copy requested - No - copy requested No - copy requested No - copy requested No - copy requested -  Would patient like information on creating a medical advance directive? - - No - Patient declined - - - -    Current Medications (verified) Outpatient Encounter Medications as of 12/27/2020  Medication Sig   acetaminophen (TYLENOL) 500 MG tablet Take 500-1,000 mg by mouth daily as needed.   blood glucose meter kit and supplies Dispense based on patient and insurance preference. Check blood sugars no more than twice daily   brimonidine (ALPHAGAN) 0.2 % ophthalmic solution Place into both eyes 3 (three) times daily. Dr. [name]  Calcium-Vitamin D (CALCIUM + D PO) Take 2 tablets by mouth daily with lunch. Calcium 6084m Vitamin D 800 Units   carvedilol (COREG) 6.25 MG tablet Take 1 tablet (6.25 mg total) by mouth 2 (two) times daily with a meal.   cetirizine (ZYRTEC) 10 MG tablet Take 10 mg by mouth daily.   Coenzyme Q10-Vitamin E (QUNOL ULTRA COQ10) 100-150 MG-UNIT CAPS Take 1 tablet by mouth daily.   COVID-19 mRNA Vac-TriS, Pfizer, (PFIZER-BIONT COVID-19 VAC-TRIS) SUSP injection Inject into the muscle.   cycloSPORINE (RESTASIS) 0.05 % ophthalmic emulsion Place 1 drop into both eyes every 12 (twelve) hours.    dextromethorphan (DELSYM) 30 MG/5ML liquid Take 15 mg by mouth as needed for cough.   diclofenac Sodium (VOLTAREN) 1 % GEL Apply topically 4 (four) times daily. If needed   diphenhydrAMINE (BENADRYL) 12.5 MG/5ML liquid Take 25 mg by mouth every 6 (six) hours as needed  for allergies. Reported on 08/06/2015   EPINEPHrine (EPIPEN 2-PAK) 0.3 mg/0.3 mL IJ SOAJ injection Inject 0.3 mLs (0.3 mg total) into the muscle once.   ezetimibe (ZETIA) 10 MG tablet Take 1 tablet (10 mg total) by mouth daily.   famotidine (PEPCID) 20 MG tablet Take 1 tablet (20 mg total) by mouth daily.   fluticasone (FLONASE) 50  MCG/ACT nasal spray Place 1-2 sprays into both nostrils daily as needed for allergies or rhinitis.   Gluc-Chonn-MSM-Boswellia-Vit D (GLUCOSAMINE CHOND TRIPLE/VIT D PO) Take 2 tablets by mouth daily.   glucose blood (ONETOUCH ULTRA) test strip CHECK BLOOD SUGAR NO MORE THAN TWICE DAILY   Lancets (ONETOUCH DELICA PLUS [id number]) MISC CHECK BLOOD SUGAR NO MORE THAN TWICE DAILY   latanoprost (XALATAN) 0.005 % ophthalmic solution Place 1 drop into the left eye at bedtime. Dr. [name]   Magnesium 250 MG TABS Take 1 tablet by mouth daily.   Methylcellulose, Laxative, (SOLUBLE FIBER PO) Take by mouth. Benefiber   montelukast (SINGULAIR) 10 MG tablet Take 1 tablet (10 mg total) by mouth at bedtime.   Multiple Vitamin (MULTIVITAMIN WITH MINERALS) TABS Take 1 tablet by mouth daily with breakfast.   naproxen sodium (ALEVE) 220 MG tablet Take 220 mg by mouth daily as needed.   nitrofurantoin, macrocrystal-monohydrate, (MACROBID) 100 MG capsule Take 1 capsule (100 mg total) by mouth 2 (two) times daily.   Polyethyl Glycol-Propyl Glycol 0.4-0.3 % SOLN Place 1 drop into both eyes daily as needed (dry eyes). Sustaine   spironolactone (ALDACTONE) 25 MG tablet Take 1 tablet (25 mg total) by mouth daily.   No facility-administered encounter medications on file as of 12/27/2020.    Allergies (verified) Bee venom, Doxycycline, Penicillins, Bee pollen, Azithromycin, Cerumenex [trolamine], Climara [estradiol], Nickel, and Sertraline   History: Past Medical History:  Diagnosis Date   Allergy    Anemia    when had uterine fibroids   Anxiety    Cataract    very small   Cholelithiasis    Closed fracture of base of fifth metacarpal bone of left hand 06/01/2018   DJD (degenerative joint disease)    Dry eye syndrome    Headache(784.0)    Hyperlipidemia 01/20/2011   Hypertension 09/29/2006   Substance abuse ([place])    Thrombocytopenia ([place]) 08/27/2011   hematology evaluation 08-2011, return prn   Urticaria     Vasomotor rhinitis    Past Surgical History:  Procedure Laterality Date   ABDOMINAL HYSTERECTOMY     no oophorectomy   APPENDECTOMY     CHOLECYSTECTOMY N/A 06/06/2016   Procedure: LAPAROSCOPIC CHOLECYSTECTOMY WITH INTRAOPERATIVE CHOLANGIOGRAM;  Surgeon: Johnathan Hausen, MD;  Location: [facility];  Service: General;  Laterality: N/A;   DILATION AND CURETTAGE OF UTERUS     EYE SURGERY Bilateral 05/06/2019   HERNIA REPAIR     mole removals     TONSILLECTOMY     UMBILICAL HERNIA REPAIR     Family History  Problem Relation Age of Onset   Coronary artery disease Father    Cancer Father        unsure of type   Other Father        Guillain- barre syndrome   Diabetes Other        uncles    Hypertension Mother    Anxiety disorder Mother    Depression Mother    Prostate cancer Other        grandfather   Breast cancer Sister 19   Depression Maternal Uncle  Colon cancer Neg Hx    Colon polyps Neg Hx    Rectal cancer Neg Hx    Stomach cancer Neg Hx    Social History   Socioeconomic History   Marital status: Widowed    Spouse name: Not on file   Number of children: 2   Years of education: Not on file   Highest education level: Not on file  Occupational History   Occupation: retired Theme park manager     Comment: was a Theme park manager  Tobacco Use   Smoking status: Never   Smokeless tobacco: Never  Vaping Use   Vaping Use: Never used  Substance and Sexual Activity   Alcohol use: No   Drug use: No   Sexual activity: Not Currently    Birth control/protection: Surgical  Other Topics Concern   Not on file  Social History Narrative   Lost her mother at 64 (april 2021)   husband had prostate ca , passed away September 20, 2013     Lives by herself   2 sons:  [place] , [place]   Drives as of [phone]   Social Determinants of Health   Financial Resource Strain: Low Risk    Difficulty of Paying Living Expenses: Not very hard  Food Insecurity: No Food Insecurity   Worried About Charity fundraiser in the  Last Year: Never true   Arboriculturist in the Last Year: Never true  Transportation Needs: No Transportation Needs   Lack of Transportation (Medical): No   Lack of Transportation (Non-Medical): No  Physical Activity: Insufficiently Active   Days of Exercise per Week: 2 days   Minutes of Exercise per Session: 20 min  Stress: No Stress Concern Present   Feeling of Stress : Not at all  Social Connections: Moderately Integrated   Frequency of Communication with Friends and Family: More than three times a week   Frequency of Social Gatherings with Friends and Family: More than three times a week   Attends Religious Services: More than 4 times per year   Active Member of [employer] or Organizations: Yes   Attends Archivist Meetings: More than 4 times per year   Marital Status: Widowed    Tobacco Counseling Counseling given: Not Answered   Clinical Intake:  Pre-visit preparation completed: Yes  Pain : No/denies pain     Nutritional Status: BMI 25 -29 Overweight Nutritional Risks: None Diabetes: No  How often do you need to have someone help you when you read instructions, pamphlets, or other written materials from your doctor or pharmacy?: 1 - Never  Diabetic?No  Interpreter Needed?: No  Information entered by :: Caroleen Hamman LPN   Activities of Daily Living In your present state of health, do you have any difficulty performing the following activities: 12/27/2020 10/31/2020  Hearing? N N  Vision? N N  Difficulty concentrating or making decisions? N N  Walking or climbing stairs? N N  Dressing or bathing? N N  Doing errands, shopping? N N  Preparing Food and eating ? N -  Using the Toilet? N -  In the past six months, have you accidently leaked urine? N -  Do you have problems with loss of bowel control? N -  Managing your Medications? N -  Managing your Finances? N -  Housekeeping or managing your Housekeeping? N -  Some recent data might be hidden     Patient Care Team: Colon Branch, MD as PCP - General Webb Laws, OD  as Referring Physician (Optometry) Mosetta Anis, MD as Referring Physician (Allergy) Sanjuana Kava, MD as Referring Physician (Obstetrics and Gynecology) Hilarie Fredrickson, Lajuan Lines, MD as Consulting Physician (Gastroenterology) Norma Fredrickson, MD as Consulting Physician (Psychiatry) [place], Jennefer Bravo, MD as Referring Physician (Dermatology) [place] (Orthopedic Surgery) Cherre Robins, PharmD (Pharmacist)  Indicate any recent Medical Services you may have received from other than [facility] providers in the past year (date may be approximate).     Assessment:   This is a routine wellness examination for [place].  Hearing/Vision screen Hearing Screening - Comments:: C/o mild hearing loss Vision Screening - Comments:: Wears glasses Last eye exam-2021  Dietary issues and exercise activities discussed: Current Exercise Habits: The patient does not participate in regular exercise at present;Home exercise routine, Type of exercise: walking, Time (Minutes): 30, Frequency (Times/Week): 2, Weekly Exercise (Minutes/Week): 60, Intensity: Mild, Exercise limited by: None identified   Goals Addressed             This Visit's Progress    Increase physical activity   Not on track      Depression Screen PHQ 2/9 Scores 12/27/2020 10/31/2020 11/18/2019 08/22/2019 03/21/2019 09/08/2018 08/19/2018  PHQ - 2 Score 0 0 0 0 0 0 0  PHQ- 9 Score - 0 0 - 0 11 -    Fall Risk Fall Risk  12/27/2020 10/31/2020 08/22/2019 08/19/2018 08/17/2017  Falls in the past year? 0 0 0 1 Yes  Number falls in past yr: 0 0 0 0 1  Injury with Fall? 0 0 0 1 -  Risk for fall due to : - - - (No Data) -  Risk for fall due to: Comment - - - tripped over sidewalk -  Follow up Falls prevention discussed - Education provided;Falls prevention discussed Education provided;Falls prevention discussed Education provided;Falls prevention discussed    FALL RISK PREVENTION  PERTAINING TO THE HOME:  Any stairs in or around the home? Yes  If so, are there any without handrails? No  Home free of loose throw rugs in walkways, pet beds, electrical cords, etc? Yes  Adequate lighting in your home to reduce risk of falls? Yes   ASSISTIVE DEVICES UTILIZED TO PREVENT FALLS:  Life alert? No  Use of a cane, walker or w/c? No  Grab bars in the bathroom? Yes  Shower chair or bench in shower? No  Elevated toilet seat or a handicapped toilet? No   TIMED UP AND GO:  Was the test performed? No-phone visit   Cognitive Function:Normal cognitive status assessed by  this Nurse Health Advisor. No abnormalities found.   MMSE - Mini Mental State Exam 08/17/2017  Orientation to time 5  Orientation to Place 5  Registration 3  Attention/ Calculation 5  Recall 2  Language- name 2 objects 2  Language- repeat 1  Language- follow 3 step command 3  Language- read & follow direction 1  Write a sentence 1  Copy design 1  Total score 29        Immunizations Immunization History  Administered Date(s) Administered   Fluad Quad(high Dose 65+) 03/21/2019   Influenza Split 05/02/2014   Influenza Whole 05/14/2007, 04/09/2009, 04/03/2010   Influenza, High Dose Seasonal PF 04/06/2015, 04/08/2018, 02/24/2020   Influenza, Seasonal, Injecte, Preservative Fre 04/12/2013   Influenza-Unspecified 08/17/2017   PFIZER Comirnaty(Gray Top)Covid-19 Tri-Sucrose Vaccine 10/31/2020   PFIZER(Purple Top)SARS-COV-2 Vaccination 09/16/2019, 10/11/2019, 05/05/2020   Pneumococcal Conjugate-13 11/30/2013   Pneumococcal Polysaccharide-23 06/30/2001, 05/14/2007, 09/08/2018   Td 10/08/2004   Tdap 04/12/2013  Zoster Recombinat (Shingrix) 03/11/2018, 05/20/2018   Zoster, Live 06/30/2005    TDAP status: Up to date  Flu Vaccine status: Up to date  Pneumococcal vaccine status: Up to date  Covid-19 vaccine status: Completed vaccines  Qualifies for Shingles Vaccine? No   Zostavax completed Yes    Shingrix Completed?: Yes  Screening Tests Health Maintenance  Topic Date Due   INFLUENZA VACCINE  01/28/2021   COVID-19 Vaccine (5 - Booster for Pfizer series) 03/03/2021   MAMMOGRAM  03/21/2021   TETANUS/TDAP  04/13/2023   DEXA SCAN  Completed   PNA vac Low Risk Adult  Completed   Zoster Vaccines- Shingrix  Completed   HPV VACCINES  Aged Out    Health Maintenance  There are no preventive care reminders to display for this patient.  Colorectal cancer screening: No longer required.   Mammogram status: Completed Bilateral 03/21/2020. Repeat every year  Bone Density status: Ordered today. Pt provided with contact info and advised to call to schedule appt.  Lung Cancer Screening: (Low Dose CT Chest recommended if Age 60-80 years, 30 pack-year currently smoking OR have quit w/in 15years.) does not qualify.     Additional Screening:  Hepatitis C Screening: does not qualify  Vision Screening: Recommended annual ophthalmology exams for early detection of glaucoma and other disorders of the eye. Is the patient up to date with their annual eye exam?  Yes  Who is the provider or what is the name of the office in which the patient attends annual eye exams? Pt unsure of name   Dental Screening: Recommended annual dental exams for proper oral hygiene  Community Resource Referral / Chronic Care Management: CRR required this visit?  No   CCM required this visit?  No      Plan:     I have personally reviewed and noted the following in the patient's chart:   Medical and social history Use of alcohol, tobacco or illicit drugs  Current medications and supplements including opioid prescriptions.  Functional ability and status Nutritional status Physical activity Advanced directives List of other physicians Hospitalizations, surgeries, and ER visits in previous 12 months Vitals Screenings to include cognitive, depression, and falls Referrals and appointments  In addition,  I have reviewed and discussed with patient certain preventive protocols, quality metrics, and best practice recommendations. A written personalized care plan for preventive services as well as general preventive health recommendations were provided to patient.   Due to this being a telephonic visit, the after visit summary with patients personalized plan was offered to patient via mail or my-chart. Patient would like to access on my-chart.   Marta Antu, LPN   [id number]  Nurse Health Advisor  Nurse Notes: None

## 2021-01-01 ENCOUNTER — Encounter: Payer: Self-pay | Admitting: Internal Medicine

## 2021-01-01 ENCOUNTER — Telehealth: Payer: Self-pay | Admitting: Internal Medicine

## 2021-01-01 ENCOUNTER — Ambulatory Visit (INDEPENDENT_AMBULATORY_CARE_PROVIDER_SITE_OTHER): Payer: PPO | Admitting: Internal Medicine

## 2021-01-01 ENCOUNTER — Other Ambulatory Visit: Payer: Self-pay

## 2021-01-01 VITALS — BP 126/72 | HR 91 | Temp 97.9°F | Resp 16 | Ht 61.0 in | Wt 158.1 lb

## 2021-01-01 DIAGNOSIS — F32A Depression, unspecified: Secondary | ICD-10-CM

## 2021-01-01 DIAGNOSIS — R739 Hyperglycemia, unspecified: Secondary | ICD-10-CM | POA: Diagnosis not present

## 2021-01-01 DIAGNOSIS — E785 Hyperlipidemia, unspecified: Secondary | ICD-10-CM | POA: Diagnosis not present

## 2021-01-01 DIAGNOSIS — F419 Anxiety disorder, unspecified: Secondary | ICD-10-CM

## 2021-01-01 DIAGNOSIS — I10 Essential (primary) hypertension: Secondary | ICD-10-CM | POA: Diagnosis not present

## 2021-01-01 NOTE — Addendum Note (Signed)
Addended by: Caroleen Hamman A on: 01/01/2021 04:30 PM   Modules accepted: Orders

## 2021-01-01 NOTE — Patient Instructions (Signed)
Continue checking your blood pressures BP GOAL is between 110/65 and  135/85. If it is consistently higher or lower, let me know    [place], PLEASE SCHEDULE YOUR APPOINTMENTS Come back for a physical exam by November 2022

## 2021-01-01 NOTE — Telephone Encounter (Signed)
Patient is requesting information regarding transportation assistance. [employer] referral ordered.

## 2021-01-01 NOTE — Assessment & Plan Note (Signed)
Prediabetes: Last A1c satisfactory HTN: Last BMP okay, ambulatory BPs mostly in the 130s.  Continue carvedilol, Aldactone. Hyperlipidemia: Controlled on Zetia. Anxiety depression: Doing well emotionally. Dizziness: As described above, likely a peripheral issue, we agreed on observation, we review the red flag symptoms that should prompt a 911 call. UTI?  No UTI on clinical grounds.  She was relief when she heard that. Chest pain: Went to the ER 10/2020, work-up negative, no further symptoms.   RTC November 2022 CPX

## 2021-01-01 NOTE — Progress Notes (Signed)
Subjective:    Patient ID: Monique [name], female    DOB: 18-Dec-1940, 80 y.o.   MRN: [id number]  DOS:  01/01/2021 Type of visit - description: F/U  Went to the ER 11/05/2020, note reviewed, was seen with chest pain, chest pain is gone.  Went to urgent care 12/19/2020, lower abdominal discomfort, UTI?  Was Rx antibiotics but UCX eventually came back negative.  Overall better, at this point she denies dysuria, gross hematuria or difficulty urinating.  We also talk about hypertension, high cholesterol.  She has a number of symptoms that are ill-defined. "Heat waves" sometimes at night before going to bed, not associated fever chills, symptoms last few minutes. Also had 2 episodes of dizziness last week: described as  spinning, associated with some nausea, decreased with resting in bed, increasing with moving her head.  Decreased with Dramamine. Specifically denies diplopia, slurred speech or motor deficits.   Review of Systems See above   Past Medical History:  Diagnosis Date   Allergy    Anemia    when had uterine fibroids   Anxiety    Cataract    very small   Cholelithiasis    Closed fracture of base of fifth metacarpal bone of left hand 06/01/2018   DJD (degenerative joint disease)    Dry eye syndrome    Headache(784.0)    Hyperlipidemia 01/20/2011   Hypertension 09/29/2006   Substance abuse ([place])    Thrombocytopenia ([place]) 08/27/2011   hematology evaluation 08-2011, return prn   Urticaria    Vasomotor rhinitis     Past Surgical History:  Procedure Laterality Date   ABDOMINAL HYSTERECTOMY     no oophorectomy   APPENDECTOMY     CHOLECYSTECTOMY N/A 06/06/2016   Procedure: LAPAROSCOPIC CHOLECYSTECTOMY WITH INTRAOPERATIVE CHOLANGIOGRAM;  Surgeon: Johnathan Hausen, MD;  Location: [facility];  Service: General;  Laterality: N/A;   DILATION AND CURETTAGE OF UTERUS     EYE SURGERY Bilateral 05/06/2019   HERNIA REPAIR     mole removals     TONSILLECTOMY     UMBILICAL HERNIA REPAIR       Allergies as of 01/01/2021       Reactions   Bee Venom Anaphylaxis   Doxycycline Hives, Itching   Penicillins Itching, Nausea And Vomiting, Swelling   syncope   Bee Pollen    Other reaction(s): Unknown   Azithromycin Itching, Rash   Cerumenex [trolamine] Itching, Rash   Climara [estradiol] Itching, Rash   Nickel Rash   Sertraline Other (See Comments)   Dry Mouth        Medication List        Accurate as of January 01, 2021 10:02 PM. If you have any questions, ask your nurse or doctor.          STOP taking these medications    nitrofurantoin (macrocrystal-monohydrate) 100 MG capsule Commonly known as: MACROBID Stopped by: Kathlene November, MD   Pfizer-BioNT COVID-19 Vac-TriS Susp injection Generic drug: COVID-19 mRNA Vac-TriS Therapist, music) Stopped by: Kathlene November, MD       TAKE these medications    acetaminophen 500 MG tablet Commonly known as: TYLENOL Take 500-1,000 mg by mouth daily as needed.   blood glucose meter kit and supplies Dispense based on patient and insurance preference. Check blood sugars no more than twice daily   brimonidine 0.2 % ophthalmic solution Commonly known as: ALPHAGAN Place into both eyes 3 (three) times daily. Dr. [name]   CALCIUM + D PO Take 2 tablets by  mouth daily with lunch. Calcium 656m  Vitamin D 800 Units   carvedilol 6.25 MG tablet Commonly known as: COREG Take 1 tablet (6.25 mg total) by mouth 2 (two) times daily with a meal.   cetirizine 10 MG tablet Commonly known as: ZYRTEC Take 10 mg by mouth daily.   cycloSPORINE 0.05 % ophthalmic emulsion Commonly known as: RESTASIS Place 1 drop into both eyes every 12 (twelve) hours.   dextromethorphan 30 MG/5ML liquid Commonly known as: DELSYM Take 15 mg by mouth as needed for cough.   diclofenac Sodium 1 % Gel Commonly known as: VOLTAREN Apply topically 4 (four) times daily. If needed   diphenhydrAMINE 12.5 MG/5ML liquid Commonly known as: BENADRYL Take 25 mg by mouth  every 6 (six) hours as needed for allergies. Reported on 08/06/2015   EPINEPHrine 0.3 mg/0.3 mL Soaj injection Commonly known as: EpiPen 2-Pak Inject 0.3 mLs (0.3 mg total) into the muscle once.   ezetimibe 10 MG tablet Commonly known as: Zetia Take 1 tablet (10 mg total) by mouth daily.   famotidine 20 MG tablet Commonly known as: PEPCID Take 1 tablet (20 mg total) by mouth daily.   fluticasone 50 MCG/ACT nasal spray Commonly known as: FLONASE Place 1-2 sprays into both nostrils daily as needed for allergies or rhinitis.   GLUCOSAMINE CHOND TRIPLE/VIT D PO Take 2 tablets by mouth daily.   latanoprost 0.005 % ophthalmic solution Commonly known as: XALATAN Place 1 drop into the left eye at bedtime. Dr. [name]  Magnesium 250 MG Tabs Take 1 tablet by mouth daily.   meclizine 25 MG tablet Commonly known as: ANTIVERT Take 25 mg by mouth daily as needed for dizziness.   montelukast 10 MG tablet Commonly known as: Singulair Take 1 tablet (10 mg total) by mouth at bedtime.   multivitamin with minerals Tabs tablet Take 1 tablet by mouth daily with breakfast.   naproxen sodium 220 MG tablet Commonly known as: ALEVE Take 220 mg by mouth daily as needed.   OneTouch Delica Plus [id number] CHECK BLOOD SUGAR NO MORE THAN TWICE DAILY   OneTouch Ultra test strip Generic drug: glucose blood CHECK BLOOD SUGAR NO MORE THAN TWICE DAILY   Polyethyl Glycol-Propyl Glycol 0.4-0.3 % Soln Place 1 drop into both eyes daily as needed (dry eyes). Sustaine   Qunol Ultra CoQ10 100-150 MG-UNIT Caps Generic drug: Coenzyme Q10-Vitamin E Take 1 tablet by mouth daily.   SOLUBLE FIBER PO Take by mouth. Benefiber   spironolactone 25 MG tablet Commonly known as: ALDACTONE Take 1 tablet (25 mg total) by mouth daily.           Objective:   Physical Exam BP 126/72 (BP Location: Left Arm, Patient Position: Sitting, Cuff Size: Small)   Pulse 91   Temp 97.9 F (36.6 C) (Oral)    Resp 16   Ht 5' 1"  (1.549 m)   Wt 158 lb 2 oz (71.7 kg)   SpO2 96%   BMI 29.88 kg/m  General:   Well developed, NAD, BMI noted. HEENT:  Normocephalic . Face symmetric, atraumatic Lungs:  CTA B Normal respiratory effort, no intercostal retractions, no accessory muscle use. Heart: RRR,  no murmur.  Lower extremities: no pretibial edema bilaterally  Skin: Not pale. Not jaundice Neurologic:  alert & oriented X3. EOMI, face symmetric. Speech normal, gait appropriate for age and unassisted Psych--  Cognition and judgment appear intact.  Cooperative with normal attention span and concentration.  Behavior appropriate. No anxious or depressed  appearing.      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  Continue carvedilol, Aldactone. Hyperlipidemia: Controlled on Zetia. Anxiety depression: Doing well emotionally. Dizziness: As described above, likely a peripheral issue, we agreed on observation, we review the red flag symptoms that should prompt a 911 call. UTI?  No UTI on clinical grounds.  She was relief when she heard that. Chest pain: Went to the ER 10/2020, work-up negative, no further symptoms.   RTC November 2022 CPX  This visit occurred during the SARS-CoV-2 public health emergency.  Safety protocols were in place, including screening questions prior to the visit, additional usage of staff PPE, and extensive cleaning of exam room while observing appropriate  contact time as indicated for disinfecting solutions.

## 2021-01-01 NOTE — Telephone Encounter (Signed)
Pt stated needing to speak with Wellness nurse, pt would like to be contacted at [phone]. Please advise.

## 2021-01-28 ENCOUNTER — Telehealth: Payer: Self-pay | Admitting: Pharmacist

## 2021-01-28 NOTE — Chronic Care Management (AMB) (Signed)
Chronic Care Management Pharmacy Assistant   Name: Monique [name]  MRN: [id number] DOB: 1940-09-12  Reason for Encounter: Disease State Hypertension    Recent office visits:  01/01/21-Monique Ladona Horns, MD (PCP) ER hospital follow up. Follow up in 4 months.  Recent consult visits:  None noted  Hospital visits:  Medication Reconciliation was completed by comparing discharge summary, patient's EMR and Pharmacy list, and upon discussion with patient.  Admitted to the hospital on 11/05/20 due to Chest pain. Discharge date was 11/05/20. Discharged from [facility]?Medications Started at [facility] Discharge:?? -started none noted  Medication Changes at Hospital Discharge: -Changed none noted  Medications Discontinued at Hospital Discharge: -Stopped none noted  Medications that remain the same after Hospital Discharge:??  -All other medications will remain the same.    Medications: Outpatient Encounter Medications as of 01/28/2021  Medication Sig Note   acetaminophen (TYLENOL) 500 MG tablet Take 500-1,000 mg by mouth daily as needed.    blood glucose meter kit and supplies Dispense based on patient and insurance preference. Check blood sugars no more than twice daily    brimonidine (ALPHAGAN) 0.2 % ophthalmic solution Place into both eyes 3 (three) times daily. Dr. Einar [name]    Calcium-Vitamin D (CALCIUM + D PO) Take 2 tablets by mouth daily with lunch. Calcium 669m  Vitamin D 800 Units    carvedilol (COREG) 6.25 MG tablet Take 1 tablet (6.25 mg total) by mouth 2 (two) times daily with a meal.    cetirizine (ZYRTEC) 10 MG tablet Take 10 mg by mouth daily.    Coenzyme Q10-Vitamin E (QUNOL ULTRA COQ10) 100-150 MG-UNIT CAPS Take 1 tablet by mouth daily.    cycloSPORINE (RESTASIS) 0.05 % ophthalmic emulsion Place 1 drop into both eyes every 12 (twelve) hours.     dextromethorphan (DELSYM) 30 MG/5ML liquid Take 15 mg by mouth as needed for cough.    diclofenac  Sodium (VOLTAREN) 1 % GEL Apply topically 4 (four) times daily. If needed    diphenhydrAMINE (BENADRYL) 12.5 MG/5ML liquid Take 25 mg by mouth every 6 (six) hours as needed for allergies. Reported on 08/06/2015    EPINEPHrine (EPIPEN 2-PAK) 0.3 mg/0.3 mL IJ SOAJ injection Inject 0.3 mLs (0.3 mg total) into the muscle once. (Patient not taking: Reported on 01/01/2021) 03/09/2018: PRN   ezetimibe (ZETIA) 10 MG tablet Take 1 tablet (10 mg total) by mouth daily.    famotidine (PEPCID) 20 MG tablet Take 1 tablet (20 mg total) by mouth daily.    fluticasone (FLONASE) 50 MCG/ACT nasal spray Place 1-2 sprays into both nostrils daily as needed for allergies or rhinitis.    Gluc-Chonn-MSM-Boswellia-Vit D (GLUCOSAMINE CHOND TRIPLE/VIT D PO) Take 2 tablets by mouth daily.    glucose blood (ONETOUCH ULTRA) test strip CHECK BLOOD SUGAR NO MORE THAN TWICE DAILY    Lancets (ONETOUCH DELICA PLUS [id number] MISC CHECK BLOOD SUGAR NO MORE THAN TWICE DAILY    latanoprost (XALATAN) 0.005 % ophthalmic solution Place 1 drop into the left eye at bedtime. Dr. MEinar [name]   Magnesium 250 MG TABS Take 1 tablet by mouth daily.    meclizine (ANTIVERT) 25 MG tablet Take 25 mg by mouth daily as needed for dizziness.    Methylcellulose, Laxative, (SOLUBLE FIBER PO) Take by mouth. Benefiber    montelukast (SINGULAIR) 10 MG tablet Take 1 tablet (10 mg total) by mouth at bedtime.    Multiple Vitamin (MULTIVITAMIN WITH MINERALS) TABS Take 1 tablet  by mouth daily with breakfast.    naproxen sodium (ALEVE) 220 MG tablet Take 220 mg by mouth daily as needed.    Polyethyl Glycol-Propyl Glycol 0.4-0.3 % SOLN Place 1 drop into both eyes daily as needed (dry eyes). Sustaine    spironolactone (ALDACTONE) 25 MG tablet Take 1 tablet (25 mg total) by mouth daily.    No facility-administered encounter medications on file as of 01/28/2021.   Current antihypertensive regimen:  Carvedilol 6.56m twice a day Spironolactone 232mdaily each  morning  How often are you checking your Blood Pressure? Twice weekly  Current home BP readings:        118/76-08/02/22       121/70-07/30/22  What recent interventions/DTPs have been made by any provider to improve Blood Pressure control since last CPP Visit: None noted  Any recent hospitalizations or ED visits since last visit with CPP? No  What diet changes have been made to improve Blood Pressure Control?  Patient states she has been watching her sugar intake and salt intake.  What exercise is being done to improve your Blood Pressure Control?  Patient states she walks around her house.  Adherence Review: Is the patient currently on ACE/ARB medication? No Does the patient have >5 day gap between last estimated fill dates? No  Star Rating Drugs: None noted  Monique MckusickRMDetroit

## 2021-02-21 DIAGNOSIS — Z20822 Contact with and (suspected) exposure to covid-19: Secondary | ICD-10-CM | POA: Diagnosis not present

## 2021-02-24 ENCOUNTER — Other Ambulatory Visit: Payer: Self-pay | Admitting: Internal Medicine

## 2021-02-25 ENCOUNTER — Telehealth: Payer: Self-pay | Admitting: Internal Medicine

## 2021-02-25 NOTE — Telephone Encounter (Signed)
error 

## 2021-02-28 ENCOUNTER — Telehealth: Payer: Self-pay | Admitting: Internal Medicine

## 2021-02-28 NOTE — Telephone Encounter (Signed)
   Telephone encounter was:  Successful.  02/28/2021 Name: Monique [name] MRN: [id number] DOB: 1941/06/01  Monique [name] is a 80 y.o. year old female who is a primary care patient of Larose Kells, Alda Berthold, MD . The community resource team was consulted for assistance with Transportation Needs   Care guide performed the following interventions: Patient provided with information about care guide support team and interviewed to confirm resource needs Spoke with patient, she was able to get a car so she doesn't need transportation assistance at this time .  Follow Up Plan:  No further follow up planned at this time. The patient has been provided with needed resources.  April Green Care Guide, Embedded Care Coordination [place], Care Management Phone: [phone] Email: [email or web address]

## 2021-03-09 DIAGNOSIS — Z20822 Contact with and (suspected) exposure to covid-19: Secondary | ICD-10-CM | POA: Diagnosis not present

## 2021-03-25 ENCOUNTER — Ambulatory Visit (HOSPITAL_BASED_OUTPATIENT_CLINIC_OR_DEPARTMENT_OTHER)
Admission: RE | Admit: 2021-03-25 | Discharge: 2021-03-25 | Disposition: A | Discharge status: Home / Self Care | Payer: PPO | Source: Ambulatory Visit | Attending: Internal Medicine | Admitting: Internal Medicine

## 2021-03-25 ENCOUNTER — Other Ambulatory Visit: Payer: Self-pay

## 2021-03-25 ENCOUNTER — Encounter (HOSPITAL_BASED_OUTPATIENT_CLINIC_OR_DEPARTMENT_OTHER): Payer: Self-pay

## 2021-03-25 ENCOUNTER — Other Ambulatory Visit (HOSPITAL_BASED_OUTPATIENT_CLINIC_OR_DEPARTMENT_OTHER): Payer: Self-pay

## 2021-03-25 DIAGNOSIS — Z1231 Encounter for screening mammogram for malignant neoplasm of breast: Secondary | ICD-10-CM | POA: Insufficient documentation

## 2021-03-25 DIAGNOSIS — Z780 Asymptomatic menopausal state: Secondary | ICD-10-CM | POA: Insufficient documentation

## 2021-03-25 MED ORDER — INFLUENZA VAC A&B SA ADJ QUAD 0.5 ML IM PRSY
PREFILLED_SYRINGE | INTRAMUSCULAR | 0 refills | Status: DC
  Filled 2021-03-25: qty 0.5, 1d supply, fill #0

## 2021-04-09 ENCOUNTER — Ambulatory Visit (INDEPENDENT_AMBULATORY_CARE_PROVIDER_SITE_OTHER): Payer: PPO | Admitting: Pharmacist

## 2021-04-09 DIAGNOSIS — I10 Essential (primary) hypertension: Secondary | ICD-10-CM | POA: Diagnosis not present

## 2021-04-09 DIAGNOSIS — Z79899 Other long term (current) drug therapy: Secondary | ICD-10-CM

## 2021-04-09 DIAGNOSIS — R739 Hyperglycemia, unspecified: Secondary | ICD-10-CM

## 2021-04-09 DIAGNOSIS — E785 Hyperlipidemia, unspecified: Secondary | ICD-10-CM

## 2021-04-09 NOTE — Patient Instructions (Signed)
Mrs. Tidmore,  It was a pleasure speaking with you today.  I have attached a summary of our visit today and information about your health goals.  If you have any questions or concerns, please feel free to contact me either at the phone number below or with a MyChart message.   Keep up the good work!  Cherre Robins, PharmD Clinical Pharmacist [place] [facility] [phone] (direct line)  [phone] (main office number)   PATIENT GOALS:  Goals Addressed             This Visit's Progress    Chronic Care Management Pharmacy Care Plan   On track    [place] (see longitudinal plan of care for additional care plan information)  Current Barriers:  Chronic Disease Management support, education, and care coordination needs related to Pre-DM, HTN, HLD, GERD, Allergic Rhinitis, Pain, Constipation, [place]   Hypertension BP Readings from Last 3 Encounters:  01/01/21 126/72  12/19/20 138/83  11/05/20 (!) 163/83  Pharmacist Clinical Goal(s): Over the next 90 days, patient will work with PharmD and providers to maintain BP goal <140/90 (home blood pressure readings have been at goal) Current regimen:  Carvedilol 6.25mg  twice a day Spironolactone 25mg  daily each morning Interventions: Reviewed home blood pressure readings; Continue to check blood pressure 2 to 3 times per week (30 minutes to 1 hour after taking blood pressure medications) Patient self care activities - Over the next 90 days, patient will: Check blood pressure 2 to 3 times per week, document, and provide at future appointments Ensure daily salt intake < 2300 mg/day Continue current regimen for blood pressure  Hyperlipidemia Lab Results  Component Value Date/Time   LDLCALC 80 10/31/2020 10:24 AM   LDLCALC 98 05/03/2020 10:28 AM  Pharmacist Clinical Goal(s): Over the next 90 days, patient will work with PharmD and providers to maintain LDL goal < 100 Current regimen:  Ezetimibe  10mg  daily Patient self care activities - Over the next 90 days, patient will: Maintain LDL less than 100 Continue current therapy for lowering cholesterol  Increase exercise as able to goal of 150 minutes per week (consider [place] [employer] program)   Pre-Diabetes Lab Results  Component Value Date/Time   HGBA1C 5.8 10/31/2020 10:24 AM   HGBA1C 5.8 11/18/2019 08:53 AM  Pharmacist Clinical Goal(s): Over the next 90 days, patient will work with PharmD and providers to maintain A1c goal <6.5% Current regimen:  Diet and exercise management   Interventions: Discussed diet and exercise Reviewed home blood glucose readings and reviewed goals  Fasting blood glucose goal (before meals) = 80 to 130 Blood glucose goal after a meal = less than 180  Patient self care activities - Over the next 90 days, patient will: Check blood sugar 1 or 2 times per week, document, and provide at future appointments Contact provider with any episodes of hypoglycemia Continue to limit intake of sugar and starchy foods  Health maintenance:  Pharmacist Clinical Goal(s): Over the next 90 days, patient will work with PharmD and providers to schedule needed services to maintain health Interventions Reviewed vaccination history and discussed benefits of COVID bivalent booster Patient has received annual flu vaccine Patient self care activities - Over the next 90 days, patient will: Patient to consider getting the following vaccines at next provider appointment, local pharmacy or [place]:  COVID bivalent booster  Medication management Pharmacist Clinical Goal(s): Over the next 90 days, patient will work with PharmD and providers to  maintain optimal medication adherence Current pharmacy: [facility] Interventions Comprehensive medication review performed. Continue current medication management strategy Mailed patient application for Restasis patient assistance. She will coordinate with Dr [name]  to submit or contact clinical pharmacist if she has questions. Patient self care activities - Over the next 90 days, patient will: Focus on medication adherence by filling and taking medications appropriately  Take medications as prescribed Report any questions or concerns to PharmD and/or provider(s) Complete application for Restasis medication assistance and either return to clinical pharmacist at [facility] or take to Dr Irven Shelling office.   Please see past updates related to this goal by clicking on the "Past Updates" button in the selected goal         The patient verbalized understanding of instructions, educational materials, and care plan provided today and agreed to receive a mailed copy of patient instructions, educational materials, and care plan.   Telephone follow up appointment with care management team member scheduled for: 2 to 3 weeks to check application and [id number]  Cherre Robins, PharmD Clinical Pharmacist [facility]

## 2021-04-09 NOTE — Chronic Care Management (AMB) (Signed)
Chronic Care Management Pharmacy Note  04/09/2021 Name:  Monique [name] MRN:  [id number] DOB:  Nov 30, 1940  Summary: Patient is doing well; home BP and BG at goals Reviewed med list and updated; Reviewed adherence - good She did mention that she is in Medicare coverage gap and cost of 90 days of Restasis was >$400. She last filled only 30 days and cost was $74  Recommendations/Changes made from today's visit: Provided education on BG and BP goals Mailed patient application for Restasis medication assistance.   Plan: Follow up in 2 to 3 weeks to check on application for medication assistance for Restasis;  Clinical pharmacist appointment for 07/2020 unless needed sooner.   Subjective: Monique [name] is an 80 y.o. year old female who is a primary patient of Paz, Alda Berthold, MD.  The CCM team was consulted for assistance with disease management and care coordination needs.    Engaged with patient by telephone for follow up visit in response to provider referral for pharmacy case management and/or care coordination services.   Consent to Services:  The patient was given information about Chronic Care Management services, agreed to services, and gave verbal consent prior to initiation of services.  Please see initial visit note for detailed documentation.   Patient Care Team: Colon Branch, MD as PCP - General Webb Laws, [place] as Referring Physician (Optometry) Mosetta Anis, MD as Referring Physician (Allergy) Sanjuana Kava, MD as Referring Physician (Obstetrics and Gynecology) Hilarie Fredrickson, Lajuan Lines, MD as Consulting Physician (Gastroenterology) Norma Fredrickson, MD as Consulting Physician (Psychiatry) [place], Jennefer Bravo, MD as Referring Physician (Dermatology) [place] (Orthopedic Surgery) Cherre Robins, PharmD (Pharmacist)  Recent office visits: 01/01/21-PCP (Dr Larose Kells) ER hospital follow up. Follow up in 4 months. 10/31/2020 - Dr Larose Kells (PCP) Seen for routine checkup. Continued  Zetia. Follow up in 6 months.  Recent consult visits: 02/13/2021-Dermatology (Dr Donneta Romberg) Seen for urticaria. Notes not available  Hospital visits: 11/05/2020 - ED Visit at [facility] for chest pain. No medication changes noted.    Objective:  Lab Results  Component Value Date   CREATININE 0.68 11/05/2020   CREATININE 0.94 10/31/2020   CREATININE 1.03 (H) 05/03/2020    Lab Results  Component Value Date   HGBA1C 5.8 10/31/2020   Last diabetic Eye exam:  Lab Results  Component Value Date/Time   HMDIABEYEEXA No Retinopathy 02/17/2017 12:00 AM    Last diabetic Foot exam: No results found for: HMDIABFOOTEX      Component Value Date/Time   CHOL 155 10/31/2020 1024   TRIG 131.0 10/31/2020 1024   HDL 49.00 10/31/2020 1024   CHOLHDL 3 10/31/2020 1024   VLDL 26.2 10/31/2020 1024   LDLCALC 80 10/31/2020 1024   LDLCALC 98 05/03/2020 1028    Hepatic Function Latest Ref Rng & Units 05/03/2020 05/23/2019 03/21/2019  Total Protein 6.1 - 8.1 g/dL 6.6 - 6.8  Albumin 3.5 - 5.2 g/dL - - 4.4  AST 10 - 35 U/L _0 ALT 6 - 29 U/L _1 Alk Phosphatase 39 - 117 U/L - - 67  Total Bilirubin 0.2 - 1.2 mg/dL 0.7 - 0.7  Bilirubin, Direct 0.0 - 0.3 mg/dL - - -    Lab Results  Component Value Date/Time   TSH 2.59 05/03/2020 10:28 AM   TSH 1.42 09/10/2016 02:55 PM    CBC Latest Ref Rng & Units 11/05/2020 10/31/2020 10/12/2019  WBC 4.0 - 10.5 K/uL 6.0 6.2 6.8  Hemoglobin 12.0 - 15.0  Chronic Care Management Pharmacy Note  04/09/2021 Name:  Monique [name] MRN:  [id number] DOB:  Nov 30, 1940  Summary: Patient is doing well; home BP and BG at goals Reviewed med list and updated; Reviewed adherence - good She did mention that she is in Medicare coverage gap and cost of 90 days of Restasis was >$400. She last filled only 30 days and cost was $74  Recommendations/Changes made from today's visit: Provided education on BG and BP goals Mailed patient application for Restasis medication assistance.   Plan: Follow up in 2 to 3 weeks to check on application for medication assistance for Restasis;  Clinical pharmacist appointment for 07/2020 unless needed sooner.   Subjective: Monique [name] is an 80 y.o. year old female who is a primary patient of Paz, Alda Berthold, MD.  The CCM team was consulted for assistance with disease management and care coordination needs.    Engaged with patient by telephone for follow up visit in response to provider referral for pharmacy case management and/or care coordination services.   Consent to Services:  The patient was given information about Chronic Care Management services, agreed to services, and gave verbal consent prior to initiation of services.  Please see initial visit note for detailed documentation.   Patient Care Team: Colon Branch, MD as PCP - General [place], [place] as Referring Physician (Optometry) Mosetta Anis, MD as Referring Physician (Allergy) Sanjuana Kava, MD as Referring Physician (Obstetrics and Gynecology) Hilarie Fredrickson, Lajuan Lines, MD as Consulting Physician (Gastroenterology) Norma Fredrickson, MD as Consulting Physician (Psychiatry) [place], Jennefer Bravo, MD as Referring Physician (Dermatology) [place] (Orthopedic Surgery) Cherre Robins, PharmD (Pharmacist)  Recent office visits: 01/01/21-PCP (Dr Larose Kells) ER hospital follow up. Follow up in 4 months. 10/31/2020 - Dr Larose Kells (PCP) Seen for routine checkup. Continued  Zetia. Follow up in 6 months.  Recent consult visits: 02/13/2021-Dermatology (Dr Donneta Romberg) Seen for urticaria. Notes not available  Hospital visits: 11/05/2020 - ED Visit at [facility] for chest pain. No medication changes noted.    Objective:  Lab Results  Component Value Date   CREATININE 0.68 11/05/2020   CREATININE 0.94 10/31/2020   CREATININE 1.03 (H) 05/03/2020    Lab Results  Component Value Date   HGBA1C 5.8 10/31/2020   Last diabetic Eye exam:  Lab Results  Component Value Date/Time   HMDIABEYEEXA No Retinopathy 02/17/2017 12:00 AM    Last diabetic Foot exam: No results found for: HMDIABFOOTEX      Component Value Date/Time   CHOL 155 10/31/2020 1024   TRIG 131.0 10/31/2020 1024   HDL 49.00 10/31/2020 1024   CHOLHDL 3 10/31/2020 1024   VLDL 26.2 10/31/2020 1024   LDLCALC 80 10/31/2020 1024   LDLCALC 98 05/03/2020 1028    Hepatic Function Latest Ref Rng & Units 05/03/2020 05/23/2019 03/21/2019  Total Protein 6.1 - 8.1 g/dL 6.6 - 6.8  Albumin 3.5 - 5.2 g/dL - - 4.4  AST 10 - 35 U/L _0 ALT 6 - 29 U/L _1 Alk Phosphatase 39 - 117 U/L - - 67  Total Bilirubin 0.2 - 1.2 mg/dL 0.7 - 0.7  Bilirubin, Direct 0.0 - 0.3 mg/dL - - -    Lab Results  Component Value Date/Time   TSH 2.59 05/03/2020 10:28 AM   TSH 1.42 09/10/2016 02:55 PM    CBC Latest Ref Rng & Units 11/05/2020 10/31/2020 10/12/2019  WBC 4.0 - 10.5 K/uL 6.0 6.2 6.8  Hemoglobin 12.0 - 15.0  Chronic Care Management Pharmacy Note  04/09/2021 Name:  Monique [name] MRN:  [id number] DOB:  Nov 30, 1940  Summary: Patient is doing well; home BP and BG at goals Reviewed med list and updated; Reviewed adherence - good She did mention that she is in Medicare coverage gap and cost of 90 days of Restasis was >$400. She last filled only 30 days and cost was $74  Recommendations/Changes made from today's visit: Provided education on BG and BP goals Mailed patient application for Restasis medication assistance.   Plan: Follow up in 2 to 3 weeks to check on application for medication assistance for Restasis;  Clinical pharmacist appointment for 07/2020 unless needed sooner.   Subjective: Monique [name] is an 80 y.o. year old female who is a primary patient of Paz, Alda Berthold, MD.  The CCM team was consulted for assistance with disease management and care coordination needs.    Engaged with patient by telephone for follow up visit in response to provider referral for pharmacy case management and/or care coordination services.   Consent to Services:  The patient was given information about Chronic Care Management services, agreed to services, and gave verbal consent prior to initiation of services.  Please see initial visit note for detailed documentation.   Patient Care Team: Colon Branch, MD as PCP - General [place], [place] as Referring Physician (Optometry) Mosetta Anis, MD as Referring Physician (Allergy) Sanjuana Kava, MD as Referring Physician (Obstetrics and Gynecology) Hilarie Fredrickson, Lajuan Lines, MD as Consulting Physician (Gastroenterology) Norma Fredrickson, MD as Consulting Physician (Psychiatry) [place], Jennefer Bravo, MD as Referring Physician (Dermatology) [place] (Orthopedic Surgery) Cherre Robins, PharmD (Pharmacist)  Recent office visits: 01/01/21-PCP (Dr Larose Kells) ER hospital follow up. Follow up in 4 months. 10/31/2020 - Dr Larose Kells (PCP) Seen for routine checkup. Continued  Zetia. Follow up in 6 months.  Recent consult visits: 02/13/2021-Dermatology (Dr Donneta Romberg) Seen for urticaria. Notes not available  Hospital visits: 11/05/2020 - ED Visit at [facility] for chest pain. No medication changes noted.    Objective:  Lab Results  Component Value Date   CREATININE 0.68 11/05/2020   CREATININE 0.94 10/31/2020   CREATININE 1.03 (H) 05/03/2020    Lab Results  Component Value Date   HGBA1C 5.8 10/31/2020   Last diabetic Eye exam:  Lab Results  Component Value Date/Time   HMDIABEYEEXA No Retinopathy 02/17/2017 12:00 AM    Last diabetic Foot exam: No results found for: HMDIABFOOTEX      Component Value Date/Time   CHOL 155 10/31/2020 1024   TRIG 131.0 10/31/2020 1024   HDL 49.00 10/31/2020 1024   CHOLHDL 3 10/31/2020 1024   VLDL 26.2 10/31/2020 1024   LDLCALC 80 10/31/2020 1024   LDLCALC 98 05/03/2020 1028    Hepatic Function Latest Ref Rng & Units 05/03/2020 05/23/2019 03/21/2019  Total Protein 6.1 - 8.1 g/dL 6.6 - 6.8  Albumin 3.5 - 5.2 g/dL - - 4.4  AST 10 - 35 U/L _0 ALT 6 - 29 U/L _1 Alk Phosphatase 39 - 117 U/L - - 67  Total Bilirubin 0.2 - 1.2 mg/dL 0.7 - 0.7  Bilirubin, Direct 0.0 - 0.3 mg/dL - - -    Lab Results  Component Value Date/Time   TSH 2.59 05/03/2020 10:28 AM   TSH 1.42 09/10/2016 02:55 PM    CBC Latest Ref Rng & Units 11/05/2020 10/31/2020 10/12/2019  WBC 4.0 - 10.5 K/uL 6.0 6.2 6.8  Hemoglobin 12.0 - 15.0  Chronic Care Management Pharmacy Note  04/09/2021 Name:  Monique [name] MRN:  [id number] DOB:  Nov 30, 1940  Summary: Patient is doing well; home BP and BG at goals Reviewed med list and updated; Reviewed adherence - good She did mention that she is in Medicare coverage gap and cost of 90 days of Restasis was >$400. She last filled only 30 days and cost was $74  Recommendations/Changes made from today's visit: Provided education on BG and BP goals Mailed patient application for Restasis medication assistance.   Plan: Follow up in 2 to 3 weeks to check on application for medication assistance for Restasis;  Clinical pharmacist appointment for 07/2020 unless needed sooner.   Subjective: Monique [name] is an 80 y.o. year old female who is a primary patient of Paz, Alda Berthold, MD.  The CCM team was consulted for assistance with disease management and care coordination needs.    Engaged with patient by telephone for follow up visit in response to provider referral for pharmacy case management and/or care coordination services.   Consent to Services:  The patient was given information about Chronic Care Management services, agreed to services, and gave verbal consent prior to initiation of services.  Please see initial visit note for detailed documentation.   Patient Care Team: Colon Branch, MD as PCP - General Webb Laws, [place] as Referring Physician (Optometry) Mosetta Anis, MD as Referring Physician (Allergy) Sanjuana Kava, MD as Referring Physician (Obstetrics and Gynecology) Hilarie Fredrickson, Lajuan Lines, MD as Consulting Physician (Gastroenterology) Norma Fredrickson, MD as Consulting Physician (Psychiatry) [place], Jennefer Bravo, MD as Referring Physician (Dermatology) [place] (Orthopedic Surgery) Cherre Robins, PharmD (Pharmacist)  Recent office visits: 01/01/21-PCP (Dr Larose Kells) ER hospital follow up. Follow up in 4 months. 10/31/2020 - Dr Larose Kells (PCP) Seen for routine checkup. Continued  Zetia. Follow up in 6 months.  Recent consult visits: 02/13/2021-Dermatology (Dr Donneta Romberg) Seen for urticaria. Notes not available  Hospital visits: 11/05/2020 - ED Visit at [facility] for chest pain. No medication changes noted.    Objective:  Lab Results  Component Value Date   CREATININE 0.68 11/05/2020   CREATININE 0.94 10/31/2020   CREATININE 1.03 (H) 05/03/2020    Lab Results  Component Value Date   HGBA1C 5.8 10/31/2020   Last diabetic Eye exam:  Lab Results  Component Value Date/Time   HMDIABEYEEXA No Retinopathy 02/17/2017 12:00 AM    Last diabetic Foot exam: No results found for: HMDIABFOOTEX      Component Value Date/Time   CHOL 155 10/31/2020 1024   TRIG 131.0 10/31/2020 1024   HDL 49.00 10/31/2020 1024   CHOLHDL 3 10/31/2020 1024   VLDL 26.2 10/31/2020 1024   LDLCALC 80 10/31/2020 1024   LDLCALC 98 05/03/2020 1028    Hepatic Function Latest Ref Rng & Units 05/03/2020 05/23/2019 03/21/2019  Total Protein 6.1 - 8.1 g/dL 6.6 - 6.8  Albumin 3.5 - 5.2 g/dL - - 4.4  AST 10 - 35 U/L _0 ALT 6 - 29 U/L _1 Alk Phosphatase 39 - 117 U/L - - 67  Total Bilirubin 0.2 - 1.2 mg/dL 0.7 - 0.7  Bilirubin, Direct 0.0 - 0.3 mg/dL - - -    Lab Results  Component Value Date/Time   TSH 2.59 05/03/2020 10:28 AM   TSH 1.42 09/10/2016 02:55 PM    CBC Latest Ref Rng & Units 11/05/2020 10/31/2020 10/12/2019  WBC 4.0 - 10.5 K/uL 6.0 6.2 6.8  Hemoglobin 12.0 - 15.0  Chronic Care Management Pharmacy Note  04/09/2021 Name:  Monique [name] MRN:  [id number] DOB:  Nov 30, 1940  Summary: Patient is doing well; home BP and BG at goals Reviewed med list and updated; Reviewed adherence - good She did mention that she is in Medicare coverage gap and cost of 90 days of Restasis was >$400. She last filled only 30 days and cost was $74  Recommendations/Changes made from today's visit: Provided education on BG and BP goals Mailed patient application for Restasis medication assistance.   Plan: Follow up in 2 to 3 weeks to check on application for medication assistance for Restasis;  Clinical pharmacist appointment for 07/2020 unless needed sooner.   Subjective: Monique [name] is an 80 y.o. year old female who is a primary patient of Paz, Alda Berthold, MD.  The CCM team was consulted for assistance with disease management and care coordination needs.    Engaged with patient by telephone for follow up visit in response to provider referral for pharmacy case management and/or care coordination services.   Consent to Services:  The patient was given information about Chronic Care Management services, agreed to services, and gave verbal consent prior to initiation of services.  Please see initial visit note for detailed documentation.   Patient Care Team: Colon Branch, MD as PCP - General Webb Laws, [place] as Referring Physician (Optometry) Mosetta Anis, MD as Referring Physician (Allergy) Sanjuana Kava, MD as Referring Physician (Obstetrics and Gynecology) Hilarie Fredrickson, Lajuan Lines, MD as Consulting Physician (Gastroenterology) Norma Fredrickson, MD as Consulting Physician (Psychiatry) [place], Jennefer Bravo, MD as Referring Physician (Dermatology) [place] (Orthopedic Surgery) Cherre Robins, PharmD (Pharmacist)  Recent office visits: 01/01/21-PCP (Dr Larose Kells) ER hospital follow up. Follow up in 4 months. 10/31/2020 - Dr Larose Kells (PCP) Seen for routine checkup. Continued  Zetia. Follow up in 6 months.  Recent consult visits: 02/13/2021-Dermatology (Dr Donneta Romberg) Seen for urticaria. Notes not available  Hospital visits: 11/05/2020 - ED Visit at [facility] for chest pain. No medication changes noted.    Objective:  Lab Results  Component Value Date   CREATININE 0.68 11/05/2020   CREATININE 0.94 10/31/2020   CREATININE 1.03 (H) 05/03/2020    Lab Results  Component Value Date   HGBA1C 5.8 10/31/2020   Last diabetic Eye exam:  Lab Results  Component Value Date/Time   HMDIABEYEEXA No Retinopathy 02/17/2017 12:00 AM    Last diabetic Foot exam: No results found for: HMDIABFOOTEX      Component Value Date/Time   CHOL 155 10/31/2020 1024   TRIG 131.0 10/31/2020 1024   HDL 49.00 10/31/2020 1024   CHOLHDL 3 10/31/2020 1024   VLDL 26.2 10/31/2020 1024   LDLCALC 80 10/31/2020 1024   LDLCALC 98 05/03/2020 1028    Hepatic Function Latest Ref Rng & Units 05/03/2020 05/23/2019 03/21/2019  Total Protein 6.1 - 8.1 g/dL 6.6 - 6.8  Albumin 3.5 - 5.2 g/dL - - 4.4  AST 10 - 35 U/L _0 ALT 6 - 29 U/L _1 Alk Phosphatase 39 - 117 U/L - - 67  Total Bilirubin 0.2 - 1.2 mg/dL 0.7 - 0.7  Bilirubin, Direct 0.0 - 0.3 mg/dL - - -    Lab Results  Component Value Date/Time   TSH 2.59 05/03/2020 10:28 AM   TSH 1.42 09/10/2016 02:55 PM    CBC Latest Ref Rng & Units 11/05/2020 10/31/2020 10/12/2019  WBC 4.0 - 10.5 K/uL 6.0 6.2 6.8  Hemoglobin 12.0 - 15.0

## 2021-04-11 ENCOUNTER — Ambulatory Visit: Payer: PPO | Admitting: Sports Medicine

## 2021-04-11 ENCOUNTER — Other Ambulatory Visit: Payer: Self-pay

## 2021-04-11 ENCOUNTER — Encounter: Payer: Self-pay | Admitting: Sports Medicine

## 2021-04-11 DIAGNOSIS — M79674 Pain in right toe(s): Secondary | ICD-10-CM | POA: Diagnosis not present

## 2021-04-11 DIAGNOSIS — B351 Tinea unguium: Secondary | ICD-10-CM

## 2021-04-11 DIAGNOSIS — J300 Vasomotor rhinitis: Secondary | ICD-10-CM | POA: Insufficient documentation

## 2021-04-11 DIAGNOSIS — M79675 Pain in left toe(s): Secondary | ICD-10-CM | POA: Diagnosis not present

## 2021-04-11 DIAGNOSIS — I739 Peripheral vascular disease, unspecified: Secondary | ICD-10-CM | POA: Diagnosis not present

## 2021-04-11 NOTE — Progress Notes (Signed)
Subjective: Monique [name] is a 80 y.o. female patient seen today in office with complaint of mildly painful thickened and elongated toenails; unable to trim. Patient denies history of Diabetes, Neuropathy, or known vascular disease but states that her husband used to trim her toenails and he is deceased now and can no longer do it so she needs help has difficulty reaching and seeing her toes to properly trim them. Patient has no other pedal complaints at this time.   Patient Active Problem List   Diagnosis Date Noted   Vasomotor rhinitis 04/11/2021   Urticaria 12/15/2019   MCI (mild cognitive impairment) 02/06/2017   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   GERD (gastroesophageal reflux disease) 06/05/2016   PCP NOTES >>>>>>>>>>>>>>>>>>>>>>>>> 02/04/2016   Severe allergic reaction 08/17/2014   Allergic rhinitis 12/01/2011   Thrombocytopenia ([place]) 08/27/2011   Hyperlipidemia 01/20/2011   Annual physical exam 09/18/2010   Hyperglycemia 09/17/2009   Backache 07/10/2008   Anxiety and depression 01/19/2008   HX OF GALLSTONE 10/20/2006   Essential hypertension 09/29/2006    Current Outpatient Medications on File Prior to Visit  Medication Sig Dispense Refill   acetaminophen (TYLENOL) 500 MG tablet Take 500-1,000 mg by mouth daily as needed.     Apple Cid Vn-Grn Tea-Bit Or-Cr (APPLE CIDER VINEGAR PLUS) TABS See admin instructions.     blood glucose meter kit and supplies Dispense based on patient and insurance preference. Check blood sugars no more than twice daily 1 each 0   brimonidine (ALPHAGAN) 0.15 % ophthalmic solution      brimonidine (ALPHAGAN) 0.2 % ophthalmic solution Place into both eyes 3 (three) times daily. Dr. [name]     Calcium-Vitamin D (CALCIUM + D PO) Take 2 tablets by mouth daily with lunch. Calcium 633m  Vitamin D 800 Units     carvedilol (COREG) 6.25 MG tablet TAKE 1 TABLET BY MOUTH 2 TIMES DAILY WITH A MEAL. 180 tablet 1   cetirizine (ZYRTEC) 10 MG tablet Take 10 mg by  mouth daily.     cetirizine (ZYRTEC) 10 MG tablet Take 1 tablet by mouth 2 (two) times daily.     Coenzyme Q10-Vitamin E (QUNOL ULTRA COQ10) 100-150 MG-UNIT CAPS Take 1 tablet by mouth daily.     cycloSPORINE (RESTASIS) 0.05 % ophthalmic emulsion Place 1 drop into both eyes every 12 (twelve) hours.      cycloSPORINE (RESTASIS) 0.05 % ophthalmic emulsion 1 drop into affected eye     dextromethorphan (DELSYM) 30 MG/5ML liquid Take 15 mg by mouth as needed for cough.     diclofenac Sodium (VOLTAREN) 1 % GEL Apply topically 4 (four) times daily. If needed     diphenhydrAMINE (BENADRYL) 12.5 MG/5ML liquid Take 25 mg by mouth every 6 (six) hours as needed for allergies. Reported on 08/06/2015     EPINEPHrine (EPIPEN 2-PAK) 0.3 mg/0.3 mL IJ SOAJ injection Inject 0.3 mLs (0.3 mg total) into the muscle once. 1 Device 1   EPINEPHrine 0.3 mg/0.3 mL IJ SOAJ injection See admin instructions.     ezetimibe (ZETIA) 10 MG tablet Take 1 tablet (10 mg total) by mouth daily. 90 tablet 3   famotidine (PEPCID) 20 MG tablet Take 20 mg by mouth 2 (two) times daily.     famotidine (PEPCID) 20 MG tablet Take 1 tablet by mouth 2 (two) times daily.     fluticasone (FLONASE) 50 MCG/ACT nasal spray Place 1-2 sprays into both nostrils daily as needed for allergies or rhinitis.     fluticasone (FLONASE)  50 MCG/ACT nasal spray 1-2 spray in each nostril     Gluc-Chonn-MSM-Boswellia-Vit D (GLUCOSAMINE CHOND TRIPLE/VIT D PO) Take 2 tablets by mouth daily.     glucose blood (ONETOUCH ULTRA) test strip CHECK BLOOD SUGAR NO MORE THAN TWICE DAILY 100 strip 12   hydrOXYzine (ATARAX/VISTARIL) 10 MG tablet 1-2 tablets     Lancets (ONETOUCH DELICA PLUS [id number]) MISC CHECK BLOOD SUGAR NO MORE THAN TWICE DAILY 100 each 12   latanoprost (XALATAN) 0.005 % ophthalmic solution Place 1 drop into the left eye at bedtime. Dr. [name]     latanoprost (XALATAN) 0.005 % ophthalmic solution 1 drop into affected eye in the evening     Magnesium 250  MG TABS 1 tablet with a meal     meclizine (ANTIVERT) 25 MG tablet Take 25 mg by mouth daily as needed for dizziness.     Methylcellulose, Laxative, (SOLUBLE FIBER PO) Take by mouth daily as needed. Benefiber     mometasone (ELOCON) 0.1 % cream 1 application     montelukast (SINGULAIR) 10 MG tablet Take 1 tablet by mouth daily.     Multiple Vitamin (MULTIVITAMIN WITH MINERALS) TABS Take 1 tablet by mouth daily with breakfast.     naproxen sodium (ALEVE) 220 MG tablet Take 220 mg by mouth daily as needed.     Polyethyl Glycol-Propyl Glycol 0.4-0.3 % SOLN Place 1 drop into both eyes daily as needed (dry eyes). Systane     spironolactone (ALDACTONE) 25 MG tablet Take 1 tablet (25 mg total) by mouth daily. 90 tablet 1   No current facility-administered medications on file prior to visit.    Allergies  Allergen Reactions   Bee Venom Anaphylaxis   Doxycycline Hives and Itching   Penicillins Itching, Nausea And Vomiting and Swelling    syncope   Bee Pollen     Other reaction(s): Unknown   Azithromycin Itching and Rash   Cerumenex [Trolamine] Itching and Rash   Climara [Estradiol] Itching and Rash   Nickel Rash   Sertraline Other (See Comments)    Dry Mouth    Objective: Physical Exam  General: Well developed, nourished, no acute distress, awake, alert and oriented x 3  Vascular: Dorsalis pedis artery 1/4 bilateral, Posterior tibial artery 1/4 bilateral, skin temperature warm to warm proximal to distal bilateral lower extremities, mild varicosities, scant pedal hair present bilateral.  Trace edema to ankles bilateral.  Neurological: Gross sensation present via light touch bilateral.   Dermatological: Skin is warm, dry, and supple bilateral, Nails 1-10 are tender, long, thick, and discolored with mild subungal debris, no webspace macerations present bilateral, no open lesions present bilateral, no significant callus/corns/hyperkeratotic tissue present bilateral. No signs of infection  bilateral.  Musculoskeletal: No symptomatic boney deformities noted bilateral. Muscular strength within normal limits without painon range of motion. No pain with calf compression bilateral.  Assessment and Plan:  Problem List Items Addressed This Visit   None Visit Diagnoses     Pain due to onychomycosis of toenails of both feet    -  Primary   PVD (peripheral vascular disease) ([place])       Relevant Medications   EPINEPHrine 0.3 mg/0.3 mL IJ SOAJ injection       -Examined patient.  -Discussed treatment options for painful mycotic nails. -Mechanically debrided and reduced mycotic nails with sterile nail nipper and dremel nail file without incident. -Advised elevation of legs to assist with edema control and swelling that is occasional at ankles and to avoid foods  that have excessive salt including ham -Patient to return in 3 months for follow up evaluation or sooner if symptoms worsen.  Landis Martins, DPM

## 2021-05-01 ENCOUNTER — Other Ambulatory Visit: Payer: Self-pay | Admitting: Internal Medicine

## 2021-05-01 ENCOUNTER — Other Ambulatory Visit: Payer: Self-pay

## 2021-05-02 ENCOUNTER — Ambulatory Visit (INDEPENDENT_AMBULATORY_CARE_PROVIDER_SITE_OTHER): Payer: PPO | Admitting: Internal Medicine

## 2021-05-02 ENCOUNTER — Ambulatory Visit: Payer: PPO | Attending: Internal Medicine

## 2021-05-02 ENCOUNTER — Other Ambulatory Visit: Payer: Self-pay | Admitting: Internal Medicine

## 2021-05-02 VITALS — BP 140/84 | HR 78 | Resp 18 | Ht 61.0 in | Wt 153.8 lb

## 2021-05-02 DIAGNOSIS — I10 Essential (primary) hypertension: Secondary | ICD-10-CM

## 2021-05-02 DIAGNOSIS — Z23 Encounter for immunization: Secondary | ICD-10-CM

## 2021-05-02 DIAGNOSIS — R739 Hyperglycemia, unspecified: Secondary | ICD-10-CM

## 2021-05-02 DIAGNOSIS — E785 Hyperlipidemia, unspecified: Secondary | ICD-10-CM | POA: Diagnosis not present

## 2021-05-02 LAB — BASIC METABOLIC PANEL
BUN: 20 mg/dL (ref 6–23)
CO2: 32 mEq/L (ref 19–32)
Calcium: 10.3 mg/dL (ref 8.4–10.5)
Chloride: 104 mEq/L (ref 96–112)
Creatinine, Ser: 1.04 mg/dL (ref 0.40–1.20)
GFR: 50.65 mL/min — ABNORMAL LOW (ref >60.00)
Glucose, Bld: 93 mg/dL (ref 70–99)
Potassium: 4.7 mEq/L (ref 3.5–5.1)
Sodium: 141 mEq/L (ref 135–145)

## 2021-05-02 NOTE — Patient Instructions (Signed)
Check your blood pressure regularly  BP GOAL is between 110/65 and  135/85. If it is consistently higher or lower, let me know      GO TO THE LAB : Get the blood work     [place], [place] back for a physical exam in 4 to 5 months

## 2021-05-02 NOTE — Progress Notes (Signed)
   Covid-19 Vaccination Clinic  Name:  Monique [name]    MRN: [id number] DOB: 1941-01-25  05/02/2021  Monique [name] was observed post Covid-19 immunization for 15 minutes without incident. She was provided with Vaccine Information Sheet and instruction to access the V-Safe system.   Monique [name] was instructed to call 911 with any severe reactions post vaccine: Difficulty breathing  Swelling of face and throat  A fast heartbeat  A bad rash all over body  Dizziness and weakness   Immunizations Administered     Name Date Dose VIS Date Route   Pfizer Covid-19 Vaccine Bivalent Booster 05/02/2021 10:54 AM 0.3 mL 02/27/2021 Intramuscular   Manufacturer: [place]   Lot: [id number]   [place]: [phone]

## 2021-05-02 NOTE — Progress Notes (Signed)
Subjective:    Patient ID: [name], female    DOB: Jun 04, 1941, 80 y.o.   MRN: [id number]  DOS:  05/02/2021 Type of visit - description: rov Since the last office visit she is feeling well. Has no major concerns. Ambulatory BPs typically normal. Medication reviewed, good compliance without apparent side effects. Denies chest pain no difficulty breathing. No nausea or vomiting   Review of Systems See above   Past Medical History:  Diagnosis Date   Allergy    Anemia    when had uterine fibroids   Anxiety    Cataract    very small   Cholelithiasis    Closed fracture of base of fifth metacarpal bone of left hand 06/01/2018   DJD (degenerative joint disease)    Dry eye syndrome    Headache(784.0)    Hyperlipidemia 01/20/2011   Hypertension 09/29/2006   Substance abuse ([place])    Thrombocytopenia ([place]) 08/27/2011   hematology evaluation 08-2011, return prn   Urticaria    Vasomotor rhinitis     Past Surgical History:  Procedure Laterality Date   ABDOMINAL HYSTERECTOMY     no oophorectomy   APPENDECTOMY     CHOLECYSTECTOMY N/A 06/06/2016   Procedure: LAPAROSCOPIC CHOLECYSTECTOMY WITH INTRAOPERATIVE CHOLANGIOGRAM;  Surgeon: Johnathan Hausen, MD;  Location: [facility];  Service: General;  Laterality: N/A;   DILATION AND CURETTAGE OF UTERUS     EYE SURGERY Bilateral 05/06/2019   HERNIA REPAIR     mole removals     TONSILLECTOMY     UMBILICAL HERNIA REPAIR      Allergies as of 05/02/2021       Reactions   Bee Venom Anaphylaxis   Doxycycline Hives, Itching   Penicillins Itching, Nausea And Vomiting, Swelling   syncope   Bee Pollen    Other reaction(s): Unknown   Azithromycin Itching, Rash   Cerumenex [trolamine] Itching, Rash   Climara [estradiol] Itching, Rash   Nickel Rash   Sertraline Other (See Comments)   Dry Mouth        Medication List        Accurate as of May 02, 2021 11:59 PM. If you have any questions, ask your nurse or doctor.           acetaminophen 500 MG tablet Commonly known as: TYLENOL Take 500-1,000 mg by mouth daily as needed.   Apple Cider Vinegar Plus Tabs See admin instructions.   blood glucose meter kit and supplies Dispense based on patient and insurance preference. Check blood sugars no more than twice daily   brimonidine 0.2 % ophthalmic solution Commonly known as: ALPHAGAN Place into both eyes 3 (three) times daily. Dr. [name]   brimonidine 0.15 % ophthalmic solution Commonly known as: ALPHAGAN   CALCIUM + D PO Take 2 tablets by mouth daily with lunch. Calcium 633m  Vitamin D 800 Units   carvedilol 6.25 MG tablet Commonly known as: COREG TAKE 1 TABLET BY MOUTH 2 TIMES DAILY WITH A MEAL.   cetirizine 10 MG tablet Commonly known as: ZYRTEC Take 10 mg by mouth daily. What changed: Another medication with the same name was removed. Continue taking this medication, and follow the directions you see here. Changed by: JKathlene November MD   cycloSPORINE 0.05 % ophthalmic emulsion Commonly known as: RESTASIS 1 drop into affected eye   cycloSPORINE 0.05 % ophthalmic emulsion Commonly known as: RESTASIS Place 1 drop into both eyes every 12 (twelve) hours.   dextromethorphan 30 MG/5ML liquid Commonly known  as: DELSYM Take 15 mg by mouth as needed for cough.   diclofenac Sodium 1 % Gel Commonly known as: VOLTAREN Apply topically 4 (four) times daily. If needed   diphenhydrAMINE 12.5 MG/5ML liquid Commonly known as: BENADRYL Take 25 mg by mouth every 6 (six) hours as needed for allergies. Reported on 08/06/2015   EPINEPHrine 0.3 mg/0.3 mL Soaj injection Commonly known as: EPI-PEN See admin instructions.   EPINEPHrine 0.3 mg/0.3 mL Soaj injection Commonly known as: EpiPen 2-Pak Inject 0.3 mLs (0.3 mg total) into the muscle once.   ezetimibe 10 MG tablet Commonly known as: ZETIA TAKE 1 TABLET BY MOUTH EVERY DAY   famotidine 20 MG tablet Commonly known as: PEPCID Take 20 mg by mouth 2  (two) times daily. What changed: Another medication with the same name was removed. Continue taking this medication, and follow the directions you see here. Changed by: Kathlene November, MD   fluticasone 50 MCG/ACT nasal spray Commonly known as: FLONASE Place 1-2 sprays into both nostrils daily as needed for allergies or rhinitis.   fluticasone 50 MCG/ACT nasal spray Commonly known as: FLONASE 1-2 spray in each nostril   GLUCOSAMINE CHOND TRIPLE/VIT D PO Take 2 tablets by mouth daily.   hydrOXYzine 10 MG tablet Commonly known as: ATARAX/VISTARIL 1-2 tablets   latanoprost 0.005 % ophthalmic solution Commonly known as: XALATAN Place 1 drop into the left eye at bedtime. Dr. [name]   latanoprost 0.005 % ophthalmic solution Commonly known as: XALATAN 1 drop into affected eye in the evening   Magnesium 250 MG Tabs 1 tablet with a meal   meclizine 25 MG tablet Commonly known as: ANTIVERT Take 25 mg by mouth daily as needed for dizziness.   mometasone 0.1 % cream Commonly known as: ELOCON 1 application   montelukast 10 MG tablet Commonly known as: SINGULAIR Take 1 tablet by mouth daily.   multivitamin with minerals Tabs tablet Take 1 tablet by mouth daily with breakfast.   naproxen sodium 220 MG tablet Commonly known as: ALEVE Take 220 mg by mouth daily as needed.   OneTouch Delica Plus [id number] Misc CHECK BLOOD SUGAR NO MORE THAN TWICE DAILY   OneTouch Ultra test strip Generic drug: glucose blood CHECK BLOOD SUGAR NO MORE THAN TWICE DAILY   Polyethyl Glycol-Propyl Glycol 0.4-0.3 % Soln Place 1 drop into both eyes daily as needed (dry eyes). Systane   Qunol Ultra CoQ10 100-150 MG-UNIT Caps Generic drug: Coenzyme Q10-Vitamin E Take 1 tablet by mouth daily.   SOLUBLE FIBER PO Take by mouth daily as needed. Benefiber   spironolactone 25 MG tablet Commonly known as: ALDACTONE Take 1 tablet (25 mg total) by mouth daily.           Objective:   Physical  Exam BP 140/84   Pulse 78   Resp 18   Ht _0  (1.549 m)   Wt 153 lb 12.8 oz (69.8 kg)   SpO2 98%   BMI 29.06 kg/m  General:   Well developed, NAD, BMI noted. HEENT:  Normocephalic . Face symmetric, atraumatic Lungs:  CTA B Normal respiratory effort, no intercostal retractions, no accessory muscle use. Heart: RRR,  no murmur.  Lower extremities: no pretibial edema bilaterally  Skin: Not pale. Not jaundice Neurologic:  alert & oriented X3.  Speech normal, gait appropriate for age and unassisted Psych--  Cognition and judgment appear intact.  Cooperative with normal attention span and concentration.  Behavior appropriate. No anxious or depressed appearing.  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 Recheck on RTC HTN: Ambulatory BPs normal, continue carvedilol, Aldactone, check BMP. Hyperlipidemia: On Zetia only, controlled.  Not on statins because urticaria was suspected to be from Pravachol. Preventive care: Had a flu shot COVID booster?  Had a total of 5 shots  MMG 02-2021 -[facility]: Cscope 10-2005 (-) and 02-2016  @ [place], next  5-10 years but optional per GI letter.  pt not ready to proceed or make a decision RTC 4 to 5 months CPX fasting.  This visit occurred during the SARS-CoV-2 public health emergency.  Safety protocols were in place, including screening questions prior to the visit, additional usage of staff  PPE, and extensive cleaning of exam room while observing appropriate contact time as indicated for disinfecting solutions.

## 2021-05-03 NOTE — Assessment & Plan Note (Signed)
Prediabetes: Last A1c 5.8. Recheck on RTC HTN: Ambulatory BPs normal, continue carvedilol, Aldactone, check BMP. Hyperlipidemia: On Zetia only, controlled.  Not on statins because urticaria was suspected to be from Pravachol. Preventive care: Had a flu shot COVID booster?  Had a total of 5 shots  MMG 02-2021 -[facility]: Cscope 10-2005 (-) and 02-2016  @ [place], next  5-10 years but optional per GI letter.  pt not ready to proceed or make a decision RTC 4 to 5 months CPX fasting.

## 2021-05-03 NOTE — Assessment & Plan Note (Signed)
Had a flu shot COVID booster?  Had a total of 5 shots  MMG 02-2021 -[facility]: Cscope 10-2005 (-) and 02-2016  @ [place], next  5-10 years but optional per GI letter.  pt not ready to proceed or make a decision

## 2021-05-09 ENCOUNTER — Ambulatory Visit (INDEPENDENT_AMBULATORY_CARE_PROVIDER_SITE_OTHER): Payer: PPO | Admitting: Pharmacist

## 2021-05-09 DIAGNOSIS — H409 Unspecified glaucoma: Secondary | ICD-10-CM

## 2021-05-09 DIAGNOSIS — I10 Essential (primary) hypertension: Secondary | ICD-10-CM

## 2021-05-09 NOTE — Chronic Care Management (AMB) (Signed)
Chronic Care Management Pharmacy Note  05/09/2021 Name:  Monique [name] MRN:  [id number] DOB:  [birth year]  Summary: She is in Medicare coverage gap and cost of 90 days of Restasis was >$400. She last filled only 30 days and cost was $74  Recommendations/Changes made from today's visit: Coordinated application for medication assistance program for Restasis - faxed today.    Plan: Follow up in 2 to 3 weeks to check medication assistance program decision  Subjective: Monique [name] is an 80 y.o. year old female who is a primary patient of Paz, Nolon Rod, MD.  The CCM team was consulted for assistance with disease management and care coordination needs.    Engaged with patient by telephone for follow up visit in response to provider referral for pharmacy case management and/or care coordination services.   Consent to Services:  The patient was given information about Chronic Care Management services, agreed to services, and gave verbal consent prior to initiation of services.  Please see initial visit note for detailed documentation.   Patient Care Team: [name], MD as PCP - General [place] Peers, [place] as Referring Physician (Optometry) Sidney Ace, MD as Referring Physician (Allergy) Essie Hart, MD as Referring Physician (Obstetrics and Gynecology) Rhea Belton, Carie Caddy, MD as Consulting Physician (Gastroenterology) Archer Asa, MD as Consulting Physician (Psychiatry) Haverstock, Elvin So, MD as Referring Physician (Dermatology) [facility] (Orthopedic Surgery) Henrene Pastor, RPH-CPP (Pharmacist)  Recent office visits: 01/01/21-PCP (Dr Drue Novel) ER hospital follow up. Follow up in 4 months. 10/31/2020 - Dr Drue Novel (PCP) Seen for routine checkup. Continued Zetia. Follow up in 6 months.  Recent consult visits: 02/13/2021-Dermatology (Dr Henderson Callas) Seen for urticaria. Notes not available  Hospital visits: 11/05/2020 - ED Visit at [facility] for chest pain. No medication changes  noted.    Objective:  Lab Results  Component Value Date   CREATININE 1.04 05/02/2021   CREATININE 0.68 11/05/2020   CREATININE 0.94 10/31/2020    Lab Results  Component Value Date   HGBA1C 5.8 10/31/2020   Last diabetic Eye exam:  Lab Results  Component Value Date/Time   HMDIABEYEEXA No Retinopathy 02/17/2017 12:00 AM    Last diabetic Foot exam: No results found for: HMDIABFOOTEX      Component Value Date/Time   CHOL 155 10/31/2020 1024   TRIG 131.0 10/31/2020 1024   HDL 49.00 10/31/2020 1024   CHOLHDL 3 10/31/2020 1024   VLDL 26.2 10/31/2020 1024   LDLCALC 80 10/31/2020 1024   LDLCALC 98 05/03/2020 1028    Hepatic Function Latest Ref Rng & Units 05/03/2020 05/23/2019 03/21/2019  Total Protein 6.1 - 8.1 g/dL 6.6 - 6.8  Albumin 3.5 - 5.2 g/dL - - 4.4  AST 10 - 35 U/L 19 20 23   ALT 6 - 29 U/L 15 17 19   Alk Phosphatase 39 - 117 U/L - - 67  Total Bilirubin 0.2 - 1.2 mg/dL 0.7 - 0.7  Bilirubin, Direct 0.0 - 0.3 mg/dL - - -    Lab Results  Component Value Date/Time   TSH 2.59 05/03/2020 10:28 AM   TSH 1.42 09/10/2016 02:55 PM    CBC Latest Ref Rng & Units 11/05/2020 10/31/2020 10/12/2019  WBC 4.0 - 10.5 K/uL 6.0 6.2 6.8  Hemoglobin 12.0 - 15.0 g/dL 13.2 44.0 10.2  Hematocrit 36.0 - 46.0 % 43.5 40.5 39.4  Platelets 150 - 400 K/uL 134(L) 127.0(L) 134.0(L)    No results found for: VD25OH  Clinical ASCVD: No  The ASCVD Risk score (  Arnett DK, et al., 2019) failed to calculate for the following reasons:   The 2019 ASCVD risk score is only valid for ages 47 to 18    Other:   DEXA 03/25/2021: AP Spine L1-L4 03/25/2021 Normal 5.3   DualFemur Neck Right 03/25/2021 Normal -0.6  DualFemur Neck Right 03/16/2018 Normal -0.3    DualFemur Total Mean 03/25/2021 Normal 0.7  DualFemur Total Mean 03/16/2018 Normal 0.9    Social History   Tobacco Use  Smoking Status Never  Smokeless Tobacco Never   BP Readings from Last 3 Encounters:  05/02/21 140/84  01/01/21 126/72   12/19/20 138/83   Pulse Readings from Last 3 Encounters:  05/02/21 78  01/01/21 91  12/19/20 79   Wt Readings from Last 3 Encounters:  05/02/21 153 lb 12.8 oz (69.8 kg)  01/01/21 158 lb 2 oz (71.7 kg)  12/27/20 149 lb (67.6 kg)    Assessment: Review of patient past medical history, allergies, medications, health status, including review of consultants reports, laboratory and other test data, was performed as part of comprehensive evaluation and provision of chronic care management services.   SDOH:  (Social Determinants of Health) assessments and interventions performed:  SDOH Interventions    Flowsheet Row Most Recent Value  SDOH Interventions   Financial Strain Interventions Other (Comment)  [applying for assistance for Restasis]        CCM Care Plan  Allergies  Allergen Reactions   Bee Venom Anaphylaxis   Doxycycline Hives and Itching   Penicillins Itching, Nausea And Vomiting and Swelling    syncope   Bee Pollen     Other reaction(s): Unknown   Azithromycin Itching and Rash   Cerumenex [Trolamine] Itching and Rash   Climara [Estradiol] Itching and Rash   Nickel Rash   Sertraline Other (See Comments)    Dry Mouth    Medications Reviewed Today     Reviewed by Henrene Pastor, RPH-CPP (Pharmacist) on 05/09/21 at 0930  Med List Status: <None>   Medication Order Taking? Sig Documenting Provider Last Dose Status Informant  acetaminophen (TYLENOL) 500 MG tablet [id number] Yes Take 500-1,000 mg by mouth daily as needed. [provider] Taking Active   Apple Cid Vn-Grn Tea-Bit Or-Cr (APPLE CIDER VINEGAR PLUS) TABS [id number] Yes See admin instructions. [provider] Taking Active   blood glucose meter kit and supplies [id number] Yes Dispense based on patient and insurance preference. Check blood sugars no more than twice daily [name], MD Taking Active   brimonidine (ALPHAGAN) 0.15 % ophthalmic solution [id number] Yes Place 1 drop into both eyes 3  (three) times daily. [provider] Taking Active   Calcium-Vitamin D (CALCIUM + D PO) [id number] Yes Take 2 tablets by mouth daily with lunch. Calcium 600mg   Vitamin D 800 Units [provider] Taking Active Self  carvedilol (COREG) 6.25 MG tablet [id number] Yes TAKE 1 TABLET BY MOUTH 2 TIMES DAILY WITH A MEAL. [name], MD Taking Active   cetirizine (ZYRTEC) 10 MG tablet [id number] Yes Take 10 mg by mouth daily. [name], MD Taking Active   Coenzyme Q10-Vitamin E (QUNOL ULTRA COQ10) 100-150 MG-UNIT CAPS [id number] Yes Take 1 tablet by mouth daily. [provider] Taking Active   cycloSPORINE (RESTASIS) 0.05 % ophthalmic emulsion [id number] Yes Place 1 drop into both eyes 2 (two) times daily. [provider] Taking Active   dextromethorphan (DELSYM) 30 MG/5ML liquid [id number] Yes Take 15 mg by mouth as needed for cough. [provider]  Taking Active            Med Note Clydie Braun, Adil Tugwell B   Wed Dec 05, 2020 10:29 AM)    diclofenac Sodium (VOLTAREN) 1 % GEL [id number] Yes Apply topically 4 (four) times daily. If needed [provider] Taking Active   diphenhydrAMINE (BENADRYL) 12.5 MG/5ML liquid [id number] Yes Take 25 mg by mouth every 6 (six) hours as needed for allergies. Reported on 08/06/2015 [provider] Taking Active            Med Note Clydie Braun, Jessye Imhoff B   Wed Dec 05, 2020 10:25 AM)    EPINEPHrine (EPIPEN 2-PAK) 0.3 mg/0.3 mL IJ SOAJ injection [id number] Yes Inject 0.3 mLs (0.3 mg total) into the muscle once. [name], MD Taking Active            Med Note (CANTER, [facility] D   Tue Mar 09, 2018 10:08 AM) PRN  ezetimibe (ZETIA) 10 MG tablet [id number] Yes TAKE 1 TABLET BY MOUTH EVERY DAY [name], MD Taking Active   famotidine (PEPCID) 20 MG tablet [id number] Yes Take 20 mg by mouth 2 (two) times daily. [name], MD Taking Active   fluticasone [facility]) 50 MCG/ACT nasal spray [id number] Yes Place 1-2 sprays into both nostrils daily as  needed for allergies or rhinitis. [provider] Taking Active Self           Med Note Clydie Braun, Glenna Durand   Wed Dec 05, 2020 10:31 AM)    Gluc-Chonn-MSM-Boswellia-Vit D (GLUCOSAMINE CHOND TRIPLE/VIT D PO) [id number] Yes Take 2 tablets by mouth daily. [provider] Taking Active   hydrOXYzine (ATARAX/VISTARIL) 10 MG tablet [id number] Yes 1-2 tablets [provider] Taking Active   Lancets [facility] [place] PLUS [place]) MISC [id number] Yes CHECK BLOOD SUGAR NO MORE THAN TWICE DAILY Bradd Canary, MD Taking Active   latanoprost (XALATAN) 0.005 % ophthalmic solution [id number] Yes Place 1 drop into the left eye at bedtime. Dr. Harriette Bouillon [provider] Taking Active   Magnesium 250 MG TABS [id number] Yes 1 tablet with a meal [provider] Taking Active   meclizine (ANTIVERT) 25 MG tablet [id number] Yes Take 25 mg by mouth daily as needed for dizziness. [provider] Taking Active   Methylcellulose, Laxative, (SOLUBLE FIBER PO) [id number] Yes Take by mouth daily as needed. Benefiber [provider] Taking Active   mometasone (ELOCON) 0.1 % cream [id number] Yes 1 application [provider] Taking Active   montelukast (SINGULAIR) 10 MG tablet [id number] Yes Take 1 tablet by mouth daily. [provider] Taking Active   Multiple Vitamin (MULTIVITAMIN WITH MINERALS) TABS [id number] Yes Take 1 tablet by mouth daily with breakfast. [provider] Taking Active Self  naproxen sodium (ALEVE) 220 MG tablet [id number] Yes Take 220 mg by mouth daily as needed. [provider] Taking Active            Med Note Clydie Braun, Juanna Cao Dec 05, 2020 10:08 AM)    Koren Bound test strip [id number] Yes CHECK BLOOD SUGAR NO MORE THAN TWICE DAILY Bradd Canary, MD Taking Active   Polyethyl Glycol-Propyl Glycol 0.4-0.3 % SOLN [id number] Yes Place 1 drop into both eyes daily as needed (dry eyes). Systane [provider]  Taking Active Self  spironolactone (ALDACTONE) 25 MG tablet [id number] Yes Take 1 tablet (25 mg total) by mouth daily. [name], MD Taking Active  Patient Active Problem List   Diagnosis Date Noted   Vasomotor rhinitis 04/11/2021   Urticaria 12/15/2019   MCI (mild cognitive impairment) 02/06/2017   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   GERD (gastroesophageal reflux disease) 06/05/2016   PCP NOTES >>>>>>>>>>>>>>>>>>>>>>>>> 02/04/2016   Severe allergic reaction 08/17/2014   Allergic rhinitis 12/01/2011   Thrombocytopenia (HCC) 08/27/2011   Hyperlipidemia 01/20/2011   Annual physical exam 09/18/2010   Hyperglycemia 09/17/2009   Backache 07/10/2008   Anxiety and depression 01/19/2008   HX OF GALLSTONE 10/20/2006   Essential hypertension 09/29/2006    Immunization History  Administered Date(s) Administered   Fluad Quad(high Dose 65+) 03/21/2019, 03/25/2021   Influenza Split 05/02/2014   Influenza Whole 05/14/2007, 04/09/2009, 04/03/2010   Influenza, High Dose Seasonal PF 04/06/2015, 09/21/2015, 09/19/2016, 09/21/2017, 04/08/2018, 02/24/2020, 11/09/2020   Influenza, Seasonal, Injecte, Preservative Fre 04/12/2013   Influenza-Unspecified 08/17/2017, 04/09/2021   PFIZER Comirnaty(Gray Top)Covid-19 Tri-Sucrose Vaccine 10/31/2020   PFIZER(Purple Top)SARS-COV-2 Vaccination 09/16/2019, 10/11/2019, 11/11/2019, 05/05/2020   Pfizer Covid-19 Vaccine Bivalent Booster 83yrs & up 05/02/2021   Pneumococcal Conjugate-13 11/30/2013   Pneumococcal Polysaccharide-23 06/30/2001, 05/14/2007, 04/25/2015, 09/21/2015, 09/19/2016, 09/21/2017, 09/08/2018, 11/09/2018, 11/09/2020   Td 10/08/2004   Tdap 04/12/2013   Zoster Recombinat (Shingrix) 03/11/2018, 05/20/2018   Zoster, Live 06/30/2005    Conditions to be addressed/monitored: HTN, HLD, Anxiety, Depression and pre DM; GERD; urticaria / allergies  Care Plan : General Pharmacy (Adult)  Updates made by Henrene Pastor, RPH-CPP since  05/09/2021 12:00 AM     Problem: pre diabetes; HTN; hyperlipidemia; glaucome; urticaria   Priority: High  Onset Date: 08/28/2020     Long-Range Goal: Medication and chronic care management   Start Date: 12/05/2020  Recent Progress: On track  Note:   CCurrent Barriers:  Chronic Disease Management support, education, and care coordination needs related to Pre-DM, HTN, HLD, GERD, Allergic Rhinitis, Glaucoma Unable to maintain control of HTN Difficulty affording Restasis now that she is in medicare coverage gap.   Pharmacist Clinical Goal(s):  Over the next 180 days, patient will achieve adherence to monitoring guidelines and medication adherence to achieve therapeutic efficacy achieve control of HTN as evidenced by BP <140/90 maintain control of pre DM and hyperlipidemia as evidenced by A1c <6.5% and LDL <100  through collaboration with PharmD and provider.  Coordinate with clinical pharmacist and provider to find cost solutions for restasis.   Interventions: 1:1 collaboration with [name], MD regarding development and update of comprehensive plan of care as evidenced by provider attestation and co-signature Inter-disciplinary care team collaboration (see longitudinal plan of care) Comprehensive medication review performed; medication list updated in electronic medical record  Pre - Diabetes: Controlled; A1c goal <6.5% Current treatment: diet and exercise  Current glucose readings:  Checking 2 times per week 106, 92, 89, 108 Denies hypoglycemic/hyperglycemic symptoms Current meal patterns: Limiting intake of sugary drinks except 1 or 2 sodas per month Limiting intake of CHO - no french fries, 1/2 sweet potato once per week; occasionally has rice but small serving Current exercise: 1 or 2 times per week - walking Interventions: (addressed at previous visit) Discussed diet and exercise - Continue to limit intake of sugar and starchy foods Recommended increase frequency of walking  / exercise to 4 times per week.  Reviewed home blood glucose readings and reviewed goals  Fasting blood glucose goal (before meals) = 80 to 130 Blood glucose goal after a meal = less than 180 Recommended patient continue to check blood glucose 1 or 2 times per week,  document, and provide at future appointments Contact provider with any episodes of hypoglycemia  Hypertension: Variable - some home blood pressure readings slightly above goal; Recent office blood pressure reading have been at goal BP goal <140/90  No hypotensive/hypertensive symptoms Recent home BP readings:  139/71; 142/72; 129/74; 143/73 HR: 95, 74, 79, 73 Current regimen:  Carvedilol 6.25mg  twice a day Spironolactone 25mg  daily each morning Interventions: (addressed at previous visit) Reviewed home blood pressure readings; Continue to check blood pressure 2 to 3 times per week (30 minutes to 1 hour after taking blood pressure medications) Ensure daily salt intake < 2300 mg/day Continue current regimen for blood pressure  Hyperlipidemia: Controlled; LDL goal <100 Current treatment: Ezetimibe 10mg  daily Previous therapy: atorvastatin - stopped by dermatologist due to suspected was causing urticaria  Interventions:  Maintain LDL less than 100 Continue current therapy for lowering cholesterol  Increase exercise as able to goal of 150 minutes per week (consider [employer] program or walking at hardware store like [employer] or [employer])    Health maintenance:  Reviewed vaccination history  Patient has received annual flu vaccine Patient to consider getting the following vaccines at next provider appointment, local pharmacy or [facility]:  COVID bivalent booster (received 04/2021)  Medication management [facility] Patient is currently in Medicare Coverage gap - cost of 90 days of Restasis was >$400. She last filled only 30 days and cost was $74.  Interventions Comprehensive  medication review performed. Continue current medication management strategy Coordinated with patient, Dr Clydene Pugh and medication assistance program for Restasis eye drops. Assisted patient in completing application and getting needed documentation. Faxed application today. Should receive decision in 7 to 10 business days.  Patient Goals/Self-Care Activities Over the next 180 days, patient will:  take medications as prescribed, check glucose 2 to 3 times per week, document, and provide at future appointments, check blood pressure 2 to 3 times per week, document, and provide at future appointments, and target a minimum of 150 minutes of moderate intensity exercise weekly  Follow Up Plan: Telephone follow up appointment with care management team member scheduled for:  07/2021     Medication Assistance: Application for Restasis  medication assistance program. in process.  Anticipated assistance start date 05/30/2021.  See plan of care for additional detail.  Patient's preferred pharmacy is:  [id number] - [place], [place] - [address] AT [facility] [address] [place] [address] Phone: [phone] Fax: [phone]   Follow Up:  Patient agrees to Care Plan and Follow-up.  Plan: Telephone follow up appointment with care management team member scheduled for:  2 to 3 weeks to check on application for Restasis patient assistance; 07/2021 for follow up with clinical pharmacist   Henrene Pastor, PharmD Clinical Pharmacist [facility] [phone]

## 2021-05-09 NOTE — Patient Instructions (Signed)
Visit Information  Hypertension BP Readings from Last 3 Encounters:  05/02/21 140/84  01/01/21 126/72  12/19/20 138/83   Pharmacist Clinical Goal(s): Over the next 90 days, patient will work with PharmD and providers to maintain BP goal <140/90 (home blood pressure readings have been at goal) Current regimen:  Carvedilol 6.25mg  twice a day Spironolactone 25mg  daily each morning Interventions: Reviewed home blood pressure readings; Continue to check blood pressure 2 to 3 times per week (30 minutes to 1 hour after taking blood pressure medications) Patient self care activities - Over the next 90 days, patient will: Check blood pressure 2 to 3 times per week, document, and provide at future appointments Ensure daily salt intake < 2300 mg/day Continue current regimen for blood pressure  Hyperlipidemia Lab Results  Component Value Date/Time   LDLCALC 80 10/31/2020 10:24 AM   LDLCALC 98 05/03/2020 10:28 AM   Pharmacist Clinical Goal(s): Over the next 90 days, patient will work with PharmD and providers to maintain LDL goal < 100 Current regimen:  Ezetimibe 10mg  daily Patient self care activities - Over the next 90 days, patient will: Maintain LDL less than 100 Continue current therapy for lowering cholesterol  Increase exercise as able to goal of 150 minutes per week (consider [employer] program)   Pre-Diabetes Lab Results  Component Value Date/Time   HGBA1C 5.8 10/31/2020 10:24 AM   HGBA1C 5.8 11/18/2019 08:53 AM   Pharmacist Clinical Goal(s): Over the next 90 days, patient will work with PharmD and providers to maintain A1c goal <6.5% Current regimen:  Diet and exercise management   Interventions: Discussed diet and exercise Reviewed home blood glucose readings and reviewed goals  Fasting blood glucose goal (before meals) = 80 to 130 Blood glucose goal after a meal = less than 180  Patient self care activities - Over the next 90 days, patient will: Check blood sugar  1 or 2 times per week, document, and provide at future appointments Contact provider with any episodes of hypoglycemia Continue to limit intake of sugar and starchy foods  Health maintenance:  Pharmacist Clinical Goal(s): Over the next 90 days, patient will work with PharmD and providers to schedule needed services to maintain health Interventions Reviewed vaccination history and discussed benefits of COVID bivalent booster Patient has received annual flu vaccine Patient self care activities - Over the next 90 days, patient will: Patient to consider getting the following vaccines at next provider appointment, local pharmacy or [place]:  [place] bivalent booster  Medication management Pharmacist Clinical Goal(s): Over the next 90 days, patient will work with PharmD and providers to maintain optimal medication adherence [facility] Interventions Comprehensive medication review performed. Continue current medication management strategy Coordinate with patient, medication assistance program and Dr [name] on Restasis application. Faxed today.  Patient self care activities - Over the next 90 days, patient will: Focus on medication adherence by filling and taking medications appropriately  Take medications as prescribed Report any questions or concerns to PharmD and/or provider(s) Please contact our office if you receive letter regarding Restasis coverage decision.   The patient verbalized understanding of instructions, educational materials, and care plan provided today and declined offer to receive copy of patient instructions, educational materials, and care plan.   Telephone follow up appointment with care management team member scheduled for: 2 to 3 weeks to check PAP  Cherre Robins, PharmD Clinical Pharmacist [facility]

## 2021-05-10 ENCOUNTER — Telehealth: Payer: PPO

## 2021-05-21 ENCOUNTER — Other Ambulatory Visit (HOSPITAL_BASED_OUTPATIENT_CLINIC_OR_DEPARTMENT_OTHER): Payer: Self-pay

## 2021-05-21 MED ORDER — PFIZER COVID-19 VAC BIVALENT 30 MCG/0.3ML IM SUSP
INTRAMUSCULAR | 0 refills | Status: DC
  Filled 2021-05-21: qty 0.3, 1d supply, fill #0

## 2021-05-22 ENCOUNTER — Ambulatory Visit: Payer: PPO | Admitting: Pharmacist

## 2021-05-22 DIAGNOSIS — H409 Unspecified glaucoma: Secondary | ICD-10-CM

## 2021-05-22 DIAGNOSIS — I10 Essential (primary) hypertension: Secondary | ICD-10-CM

## 2021-05-22 DIAGNOSIS — Z79899 Other long term (current) drug therapy: Secondary | ICD-10-CM

## 2021-05-22 NOTE — Patient Instructions (Signed)
Visit Information  Thank you for taking time to visit with me today. Please don't hesitate to contact me if I can be of assistance to you before our next scheduled telephone appointment.  Following are the goals we discussed today:   Hypertension BP Readings from Last 3 Encounters:  05/02/21 140/84  01/01/21 126/72  12/19/20 138/83   Pharmacist Clinical Goal(s): Over the next 90 days, patient will work with PharmD and providers to maintain BP goal <140/90 (home blood pressure readings have been at goal) Current regimen:  Carvedilol 6.25mg  twice a day Spironolactone 25mg  daily each morning Interventions: Reviewed home blood pressure readings; Continue to check blood pressure 2 to 3 times per week (30 minutes to 1 hour after taking blood pressure medications) Patient self care activities - Over the next 90 days, patient will: Check blood pressure 2 to 3 times per week, document, and provide at future appointments Ensure daily salt intake < 2300 mg/day Continue current regimen for blood pressure  Hyperlipidemia Lab Results  Component Value Date/Time   LDLCALC 80 10/31/2020 10:24 AM   LDLCALC 98 05/03/2020 10:28 AM   Pharmacist Clinical Goal(s): Over the next 90 days, patient will work with PharmD and providers to maintain LDL goal < 100 Current regimen:  Ezetimibe 10mg  daily Patient self care activities - Over the next 90 days, patient will: Maintain LDL less than 100 Continue current therapy for lowering cholesterol  Increase exercise as able to goal of 150 minutes per week (consider [employer] program)   Pre-Diabetes Lab Results  Component Value Date/Time   HGBA1C 5.8 10/31/2020 10:24 AM   HGBA1C 5.8 11/18/2019 08:53 AM   Pharmacist Clinical Goal(s): Over the next 90 days, patient will work with PharmD and providers to maintain A1c goal <6.5% Current regimen:  Diet and exercise management   Interventions: Discussed diet and exercise Reviewed home blood glucose  readings and reviewed goals  Fasting blood glucose goal (before meals) = 80 to 130 Blood glucose goal after a meal = less than 180  Patient self care activities - Over the next 90 days, patient will: Check blood sugar 1 or 2 times per week, document, and provide at future appointments Contact provider with any episodes of hypoglycemia Continue to limit intake of sugar and starchy foods  Health maintenance:  Pharmacist Clinical Goal(s): Over the next 90 days, patient will work with PharmD and providers to schedule needed services to maintain health Interventions Reviewed vaccination history and discussed benefits of COVID bivalent booster Patient has received annual flu vaccine Patient self care activities - Over the next 90 days, patient will: Patient to consider getting the following vaccines at next provider appointment, local pharmacy or [place]:  [place] bivalent booster  Medication management Pharmacist Clinical Goal(s): Over the next 90 days, patient will work with PharmD and providers to maintain optimal medication adherence [facility] Interventions Comprehensive medication review performed. Continue current medication management strategy Coordinate with patient, medication assistance program and Dr [name] on Restasis application. Faxed today.  Patient self care activities - Over the next 90 days, patient will: Focus on medication adherence by filling and taking medications appropriately  Take medications as prescribed Report any questions or concerns to PharmD and/or provider(s) Please contact Dr Irven Shelling office about shipment of Restasis patient assistance.   Our next appointment is by telephone on 08/26/2020 at 11am  Please call the care guide team at [phone] if you need to cancel or reschedule your appointment.  The patient verbalized understanding of instructions, educational materials, and care plan provided today and  declined offer to receive copy of patient instructions, educational materials, and care plan.   Cherre Robins, PharmD Clinical Pharmacist [facility]

## 2021-05-22 NOTE — Chronic Care Management (AMB) (Signed)
Chronic Care Management Pharmacy Note  05/22/2021 Name:  Monique [name] MRN:  [id number] DOB:  [birth year]  Summary: She is in Medicare coverage gap and cost of 90 days of Restasis was >$400. She last filled only 30 days and cost was $74. Patient has been approved for Restasis thru patient assistance until 06/29/2022    Plan: Follow up planned in Feb 2023.   Subjective: Monique [name] is an 80 y.o. year old female who is a primary patient of Paz, Nolon Rod, MD.  The CCM team was consulted for assistance with disease management and care coordination needs.    Engaged with patient by telephone for follow up visit in response to provider referral for pharmacy case management and/or care coordination services.   Consent to Services:  The patient was given information about Chronic Care Management services, agreed to services, and gave verbal consent prior to initiation of services.  Please see initial visit note for detailed documentation.   Patient Care Team: [name], MD as PCP - General [place], [place] as Referring Physician (Optometry) Sidney Ace, MD as Referring Physician (Allergy) Essie Hart, MD as Referring Physician (Obstetrics and Gynecology) Rhea Belton, Carie Caddy, MD as Consulting Physician (Gastroenterology) Archer Asa, MD as Consulting Physician (Psychiatry) Sharyn Lull, Elvin So, MD as Referring Physician (Dermatology) Edward [place] (Orthopedic Surgery) Henrene Pastor, RPH-CPP (Pharmacist)  Recent office visits: 05/02/2021 - Internal Med (Dr Drue Novel) follow up chronic conditions . Lowered dose of cetirizine from 10mg  bid to qd.  01/01/21-PCP (Dr Drue Novel) ER hospital follow up. Follow up in 4 months. 10/31/2020 - Dr Drue Novel (PCP) Seen for routine checkup. Continued Zetia. Follow up in 6 months.  Recent consult visits: 02/13/2021-Dermatology (Dr [place]) Seen for urticaria. Notes not available  Hospital visits: 11/05/2020 - ED Visit at [facility] for chest pain. No  medication changes noted.    Objective:  Lab Results  Component Value Date   CREATININE 1.04 05/02/2021   CREATININE 0.68 11/05/2020   CREATININE 0.94 10/31/2020    Lab Results  Component Value Date   HGBA1C 5.8 10/31/2020   Last diabetic Eye exam:  Lab Results  Component Value Date/Time   HMDIABEYEEXA No Retinopathy 02/17/2017 12:00 AM    Last diabetic Foot exam: No results found for: HMDIABFOOTEX      Component Value Date/Time   CHOL 155 10/31/2020 1024   TRIG 131.0 10/31/2020 1024   HDL 49.00 10/31/2020 1024   CHOLHDL 3 10/31/2020 1024   VLDL 26.2 10/31/2020 1024   LDLCALC 80 10/31/2020 1024   LDLCALC 98 05/03/2020 1028    Hepatic Function Latest Ref Rng & Units 05/03/2020 05/23/2019 03/21/2019  Total Protein 6.1 - 8.1 g/dL 6.6 - 6.8  Albumin 3.5 - 5.2 g/dL - - 4.4  AST 10 - 35 U/L 19 20 23   ALT 6 - 29 U/L 15 17 19   Alk Phosphatase 39 - 117 U/L - - 67  Total Bilirubin 0.2 - 1.2 mg/dL 0.7 - 0.7  Bilirubin, Direct 0.0 - 0.3 mg/dL - - -    Lab Results  Component Value Date/Time   TSH 2.59 05/03/2020 10:28 AM   TSH 1.42 09/10/2016 02:55 PM    CBC Latest Ref Rng & Units 11/05/2020 10/31/2020 10/12/2019  WBC 4.0 - 10.5 K/uL 6.0 6.2 6.8  Hemoglobin 12.0 - 15.0 g/dL 91.4 78.2 95.6  Hematocrit 36.0 - 46.0 % 43.5 40.5 39.4  Platelets 150 - 400 K/uL 134(L) 127.0(L) 134.0(L)    No results found for: [facility]  Clinical ASCVD: No  The ASCVD Risk score (Arnett DK, et al., 2019) failed to calculate for the following reasons:   The 2019 ASCVD risk score is only valid for ages 40 to 63    Other:   DEXA 03/25/2021: AP Spine L1-L4 03/25/2021 Normal 5.3   DualFemur Neck Right 03/25/2021 Normal -0.6  DualFemur Neck Right 03/16/2018 Normal -0.3    DualFemur Total Mean 03/25/2021 Normal 0.7  DualFemur Total Mean 03/16/2018 Normal 0.9    Social History   Tobacco Use  Smoking Status Never  Smokeless Tobacco Never   BP Readings from Last 3 Encounters:  05/02/21 140/84   01/01/21 126/72  12/19/20 138/83   Pulse Readings from Last 3 Encounters:  05/02/21 78  01/01/21 91  12/19/20 79   Wt Readings from Last 3 Encounters:  05/02/21 153 lb 12.8 oz (69.8 kg)  01/01/21 158 lb 2 oz (71.7 kg)  12/27/20 149 lb (67.6 kg)    Assessment: Review of patient past medical history, allergies, medications, health status, including review of consultants reports, laboratory and other test data, was performed as part of comprehensive evaluation and provision of chronic care management services.   SDOH:  (Social Determinants of Health) assessments and interventions performed:      CCM Care Plan  Allergies  Allergen Reactions   Bee Venom Anaphylaxis   Doxycycline Hives and Itching   Penicillins Itching, Nausea And Vomiting and Swelling    syncope   Bee Pollen     Other reaction(s): Unknown   Azithromycin Itching and Rash   Cerumenex [Trolamine (Triethanolamine)] Itching and Rash   Climara [Estradiol] Itching and Rash   Nickel Rash   Sertraline Other (See Comments)    Dry Mouth    Medications Reviewed Today     Reviewed by Henrene Pastor, RPH-CPP (Pharmacist) on 05/22/21 at 1345  Med List Status: <None>   Medication Order Taking? Sig Documenting Provider Last Dose Status Informant  acetaminophen (TYLENOL) 500 MG tablet [id number] Yes Take 500-1,000 mg by mouth daily as needed. [provider] Taking Active   Apple Cid Vn-Grn Tea-Bit Or-Cr (APPLE CIDER VINEGAR PLUS) TABS [id number] Yes See admin instructions. [provider] Taking Active   blood glucose meter kit and supplies [id number] Yes Dispense based on patient and insurance preference. Check blood sugars no more than twice daily [name], MD Taking Active   brimonidine (ALPHAGAN) 0.15 % ophthalmic solution [id number] Yes Place 1 drop into both eyes 3 (three) times daily. [provider] Taking Active   Calcium-Vitamin D (CALCIUM + D PO) [id number] Yes Take 2 tablets by mouth  daily with lunch. Calcium 600mg   Vitamin D 800 Units [provider] Taking Active Self  carvedilol (COREG) 6.25 MG tablet [id number] Yes TAKE 1 TABLET BY MOUTH 2 TIMES DAILY WITH A MEAL. [name], MD Taking Active   cetirizine (ZYRTEC) 10 MG tablet [id number] Yes Take 10 mg by mouth daily. [name], MD Taking Active   Coenzyme Q10-Vitamin E (QUNOL ULTRA COQ10) 100-150 MG-UNIT CAPS [id number] Yes Take 1 tablet by mouth daily. [provider] Taking Active   cycloSPORINE (RESTASIS) 0.05 % ophthalmic emulsion [id number] Yes Place 1 drop into both eyes 2 (two) times daily. [provider] Taking Active   dextromethorphan (DELSYM) 30 MG/5ML liquid [id number] Yes Take 15 mg by mouth as needed for cough. [provider] Taking Active            Med Note (Clydie Braun, Lyndee Herbst B  Wed Dec 05, 2020 10:29 AM)    diclofenac Sodium (VOLTAREN) 1 % GEL [id number] Yes Apply topically 4 (four) times daily. If needed [provider] Taking Active   diphenhydrAMINE (BENADRYL) 12.5 MG/5ML liquid [id number] Yes Take 25 mg by mouth every 6 (six) hours as needed for allergies. Reported on 08/06/2015 [provider] Taking Active            Med Note Clydie Braun, Dameion Briles B   Wed Dec 05, 2020 10:25 AM)    EPINEPHrine (EPIPEN 2-PAK) 0.3 mg/0.3 mL IJ SOAJ injection [id number] Yes Inject 0.3 mLs (0.3 mg total) into the muscle once. [name], MD Taking Active            Med Note (CANTER, [facility] D   Tue Mar 09, 2018 10:08 AM) PRN  ezetimibe (ZETIA) 10 MG tablet [id number] Yes TAKE 1 TABLET BY MOUTH EVERY DAY [name], MD Taking Active   famotidine (PEPCID) 20 MG tablet [id number] Yes Take 20 mg by mouth 2 (two) times daily. [name], MD Taking Active   fluticasone [facility]) 50 MCG/ACT nasal spray [id number] Yes Place 1-2 sprays into both nostrils daily as needed for allergies or rhinitis. [provider] Taking Active Self           Med Note Clydie Braun, Glenna Durand   Wed Dec 05, 2020  10:31 AM)    Gluc-Chonn-MSM-Boswellia-Vit D (GLUCOSAMINE CHOND TRIPLE/VIT D PO) [id number] Yes Take 2 tablets by mouth daily. [provider] Taking Active   hydrOXYzine (ATARAX/VISTARIL) 10 MG tablet [id number] Yes 1-2 tablets [provider] Taking Active   Lancets [facility] PLUS [place]) MISC [id number] Yes CHECK BLOOD SUGAR NO MORE THAN TWICE DAILY Bradd Canary, MD Taking Active   latanoprost (XALATAN) 0.005 % ophthalmic solution [id number] Yes Place 1 drop into the left eye at bedtime. Dr. Harriette Bouillon [provider] Taking Active   Magnesium 250 MG TABS [id number] Yes 1 tablet with a meal [provider] Taking Active   meclizine (ANTIVERT) 25 MG tablet [id number] Yes Take 25 mg by mouth daily as needed for dizziness. [provider] Taking Active   Methylcellulose, Laxative, (SOLUBLE FIBER PO) [id number] Yes Take by mouth daily as needed. Benefiber [provider] Taking Active   mometasone (ELOCON) 0.1 % cream [id number] Yes 1 application [provider] Taking Active   montelukast (SINGULAIR) 10 MG tablet [id number] Yes Take 1 tablet by mouth daily. [provider] Taking Active   Multiple Vitamin (MULTIVITAMIN WITH MINERALS) TABS [id number] Yes Take 1 tablet by mouth daily with breakfast. [provider] Taking Active Self  naproxen sodium (ALEVE) 220 MG tablet [id number] Yes Take 220 mg by mouth daily as needed. [provider] Taking Active            Med Note Clydie Braun, Juanna Cao Dec 05, 2020 10:08 AM)    Koren Bound test strip [id number] Yes CHECK BLOOD SUGAR NO MORE THAN TWICE DAILY Bradd Canary, MD Taking Active   Polyethyl Glycol-Propyl Glycol 0.4-0.3 % SOLN [id number] Yes Place 1 drop into both eyes daily as needed (dry eyes). Systane [provider] Taking Active Self  spironolactone (ALDACTONE) 25 MG tablet [id number] Yes Take 1 tablet (25 mg total) by mouth daily. [name], MD  Taking Active             Patient Active Problem List   Diagnosis Date Noted   Vasomotor rhinitis 04/11/2021   Urticaria 12/15/2019  MCI (mild cognitive impairment) 02/06/2017   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   GERD (gastroesophageal reflux disease) 06/05/2016   PCP NOTES >>>>>>>>>>>>>>>>>>>>>>>>> 02/04/2016   Severe allergic reaction 08/17/2014   Allergic rhinitis 12/01/2011   Thrombocytopenia (HCC) 08/27/2011   Hyperlipidemia 01/20/2011   Annual physical exam 09/18/2010   Hyperglycemia 09/17/2009   Backache 07/10/2008   Anxiety and depression 01/19/2008   HX OF GALLSTONE 10/20/2006   Essential hypertension 09/29/2006    Immunization History  Administered Date(s) Administered   Fluad Quad(high Dose 65+) 03/21/2019, 03/25/2021   Influenza Split 05/02/2014   Influenza Whole 05/14/2007, 04/09/2009, 04/03/2010   Influenza, High Dose Seasonal PF 04/06/2015, 09/21/2015, 09/19/2016, 09/21/2017, 04/08/2018, 02/24/2020, 11/09/2020   Influenza, Seasonal, Injecte, Preservative Fre 04/12/2013   Influenza-Unspecified 08/17/2017, 04/09/2021   PFIZER Comirnaty(Gray Top)Covid-19 Tri-Sucrose Vaccine 10/31/2020   PFIZER(Purple Top)SARS-COV-2 Vaccination 09/16/2019, 10/11/2019, 11/11/2019, 05/05/2020   Pfizer Covid-19 Vaccine Bivalent Booster 24yrs & up 05/02/2021   Pneumococcal Conjugate-13 11/30/2013   Pneumococcal Polysaccharide-23 06/30/2001, 05/14/2007, 04/25/2015, 09/21/2015, 09/19/2016, 09/21/2017, 09/08/2018, 11/09/2018, 11/09/2020   Td 10/08/2004   Tdap 04/12/2013   Zoster Recombinat (Shingrix) 03/11/2018, 05/20/2018   Zoster, Live 06/30/2005    Conditions to be addressed/monitored: HTN, HLD, Anxiety, Depression and pre DM; GERD; urticaria / allergies  Care Plan : General Pharmacy (Adult)  Updates made by Henrene Pastor, RPH-CPP since 05/22/2021 12:00 AM     Problem: pre diabetes; HTN; hyperlipidemia; glaucome; urticaria   Priority: High  Onset Date: 08/28/2020      Long-Range Goal: Medication and chronic care management   Start Date: 12/05/2020  Recent Progress: On track  Note:   CCurrent Barriers:  Chronic Disease Management support, education, and care coordination needs related to Pre-DM, HTN, HLD, GERD, Allergic Rhinitis, Glaucoma Unable to maintain control of HTN Difficulty affording Restasis now that she is in medicare coverage gap.   Pharmacist Clinical Goal(s):  Over the next 180 days, patient will achieve adherence to monitoring guidelines and medication adherence to achieve therapeutic efficacy achieve control of HTN as evidenced by BP <140/90 maintain control of pre DM and hyperlipidemia as evidenced by A1c <6.5% and LDL <100  through collaboration with PharmD and provider.  Coordinate with clinical pharmacist and provider to find cost solutions for restasis.   Interventions: 1:1 collaboration with [name], MD regarding development and update of comprehensive plan of care as evidenced by provider attestation and co-signature Inter-disciplinary care team collaboration (see longitudinal plan of care) Comprehensive medication review performed; medication list updated in electronic medical record  Pre - Diabetes: Controlled; A1c goal <6.5% Current treatment: diet and exercise  Current glucose readings:  Checking 2 times per week 106, 92, 89, 108 Denies hypoglycemic/hyperglycemic symptoms Current meal patterns: Limiting intake of sugary drinks except 1 or 2 sodas per month Limiting intake of CHO - no french fries, 1/2 sweet potato once per week; occasionally has rice but small serving Current exercise: 1 or 2 times per week - walking Interventions: (addressed at previous visit) Discussed diet and exercise - Continue to limit intake of sugar and starchy foods Recommended increase frequency of walking / exercise to 4 times per week.  Reviewed home blood glucose readings and reviewed goals  Fasting blood glucose goal (before meals) =  80 to 130 Blood glucose goal after a meal = less than 180 Recommended patient continue to check blood glucose 1 or 2 times per week, document, and provide at future appointments Contact provider with any episodes of hypoglycemia  Hypertension: Variable - some home blood  pressure readings slightly above goal; Recent office blood pressure reading have been at goal BP goal <140/90  No hypotensive/hypertensive symptoms Recent home BP readings:  139/71; 142/72; 129/74; 143/73 HR: 95, 74, 79, 73 Current regimen:  Carvedilol 6.25mg  twice a day Spironolactone 25mg  daily each morning Interventions: (addressed at previous visit) Reviewed home blood pressure readings; Continue to check blood pressure 2 to 3 times per week (30 minutes to 1 hour after taking blood pressure medications) Ensure daily salt intake < 2300 mg/day Continue current regimen for blood pressure  Hyperlipidemia: Controlled; LDL goal <100 Current treatment: Ezetimibe 10mg  daily Previous therapy: atorvastatin - stopped by dermatologist due to suspected was causing urticaria  Interventions:  Maintain LDL less than 100 Continue current therapy for lowering cholesterol  Increase exercise as able to goal of 150 minutes per week (consider [employer] program or walking at hardware store like [employer] or [employer])    Health maintenance:  Reviewed vaccination history  Patient has received annual flu vaccine Patient to consider getting the following vaccines at next provider appointment, local pharmacy or [facility]:  COVID bivalent booster (received 04/2021)  Medication management [facility] Patient is currently in Medicare Coverage gap - cost of 90 days of Restasis was >$400. She last filled only 30 days and cost was $74.  Interventions Comprehensive medication review performed. Continue current medication management strategy Coordinated with patient, Dr Clydene Pugh and medication assistance  program for Restasis eye drops. Patient has been approved to receive Restasis from [employer] patient assistance program thru 06/29/2022. First shipment was shipped 05/17/2021 and received 05/20/2021. Patient states she has not received. After some research, it was shipped to her providers office - Dr Harriette Bouillon. Notified patient and office.  Patient Goals/Self-Care Activities Over the next 180 days, patient will:  take medications as prescribed, check glucose 2 to 3 times per week, document, and provide at future appointments, check blood pressure 2 to 3 times per week, document, and provide at future appointments, and target a minimum of 150 minutes of moderate intensity exercise weekly  Follow Up Plan: Telephone follow up appointment with care management team member scheduled for:  07/2021     Medication Assistance: Coordinated with patient, Dr Clydene Pugh and medication assistance program for Restasis eye drops. Patient has been approved to receive Restasis from [employer] patient assistance program starting 05/17/2021 thru 06/29/2022. First shipment was shipped 05/17/2021 and received 05/20/2021. Patient states she has not received. After some research, it was shipped to her providers office - Dr Harriette Bouillon. Notified patient and office. Patient's preferred pharmacy is:  [id number] [place] [place] - [address] AT [facility] [address] [place] [address] Phone: [phone] Fax: [phone]   Follow Up:  Patient agrees to Care Plan and Follow-up.  Plan: Telephone follow up appointment with care management team member scheduled for:  2 to 3 weeks to check on application for Restasis patient assistance; 07/2021 for follow up with clinical pharmacist   Henrene Pastor, PharmD Clinical Pharmacist [facility] [phone]

## 2021-05-29 DIAGNOSIS — R7303 Prediabetes: Secondary | ICD-10-CM | POA: Diagnosis not present

## 2021-05-29 DIAGNOSIS — H409 Unspecified glaucoma: Secondary | ICD-10-CM | POA: Diagnosis not present

## 2021-05-29 DIAGNOSIS — I10 Essential (primary) hypertension: Secondary | ICD-10-CM

## 2021-07-28 ENCOUNTER — Other Ambulatory Visit: Payer: Self-pay | Admitting: Internal Medicine

## 2021-08-23 ENCOUNTER — Ambulatory Visit: Payer: PPO | Admitting: Pharmacist
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
## 2021-08-26 ENCOUNTER — Telehealth: Payer: PPO

## 2021-08-30 ENCOUNTER — Ambulatory Visit: Payer: PPO | Admitting: Pharmacist

## 2021-08-30 DIAGNOSIS — Z79899 Other long term (current) drug therapy: Secondary | ICD-10-CM

## 2021-08-30 NOTE — Chronic Care Management (AMB) (Signed)
Chronic Care Management Pharmacy Note  08/30/2021 Name:  Monique [name] MRN:  [id number] DOB:  1941-03-10  Summary: Patient called to ask if Ok to take Wellness Formula Advance Immune Support. Reviewed ingredients and checked for interactions with medications and potential side effects. After reviewing, recommended patient take this supplement. Could affect metabolism of carvedilol and lead to variable blood pressure.  Specifically, Coptis Rhizome Extract, Kudzu Root or Astragalus Root Extract could interfere with metabolism of carvedilol thru p-glucoprotein or CYP450 2D6 substrate interactions.   List of Ingredients:  Vitamin A (as beta-carotene 900 mcg & palmitate 600 mcg) 1,500 mcg 167% Vitamin C (from ascorbic acid and zinc ascorbate) 1,300 mg 1,444% Vitamin D-3 (as cholecalciferol) 20 mcg (800 IU) 100% Calcium (as dibasic calcium phosphate & naturally occurring) 31 mg 2% Zinc (as zinc citrate and ascorbate) 23 mg 209% Selenium (as sodium selenite) 60 mcg 109% Copper (as copper citrate) 162 mcg 18% Garlic Bulb 360 mg   Propolis Extract 295 mg   Echinacea purpurea Whole Plant Extract 270 mg   Elderberry Fruit Extract 240 mg   Aromatic Solomon's Seal Rhizome 120 mg   Horehound Aerial Parts Extract 100 mg   Andrographis Aerial Parts Extract 100 mg   Olive Leaf Extract 100 mg   Isatis Root Extract 75 mg   Eleuthero Root Extract 75 mg   Citrus Bioflavonoid Complex 60 mg   Isatis Leaf Extract 60 mg   Cinnamomum spp. Bark Extract 55 mg   Kudzu Root Extract 55 mg   Mullein Leaf 50 mg   Angelica Root Extract 45 mg   Astragalus Root Extract 45 mg   Elecampane Root Extract 30 mg   Pau D'Arco Bark Extract 30 mg   Cayenne Fruit 30 mg   Ginger Root Extract 30 mg   Coptis Rhizome Extract 25 mg   Grape Seed Extract (Proanthodyn) 10 mg  Plan: Follow up planned in 3 months.   Subjective: Monique [name] is an 81 y.o. year old female who is a primary patient of Paz, Nolon Rod, MD.   The CCM team was consulted for assistance with disease management and care coordination needs.    Engaged with patient by telephone for follow up visit in response to provider referral for pharmacy case management and/or care coordination services.   Consent to Services:  The patient was given information about Chronic Care Management services, agreed to services, and gave verbal consent prior to initiation of services.  Please see initial visit note for detailed documentation.   Patient Care Team: [name], MD as PCP - General [place] Pe[place], [place] as Referring Physician (Optometry) Sidney Ace, MD as Referring Physician (Allergy) Essie Hart, MD as Referring Physician (Obstetrics and Gynecology) Rhea Belton, Carie Caddy, MD as Consulting Physician (Gastroenterology) Archer Asa, MD as Consulting Physician (Psychiatry) Sharyn Lull, Elvin So, MD as Referring Physician (Dermatology) [facility] (Orthopedic Surgery) Henrene Pastor, RPH-CPP (Pharmacist)  Recent office visits: 05/02/2021 - Internal Med (Dr Drue Novel) follow up chronic conditions . Lowered dose of cetirizine from 10mg  bid to qd.  01/01/21-PCP (Dr Drue Novel) ER hospital follow up. Follow up in 4 months. 10/31/2020 - Dr Drue Novel (PCP) Seen for routine checkup. Continued Zetia. Follow up in 6 months.  Recent consult visits: 02/13/2021-Dermatology (Dr Pollock Callas) Seen for urticaria. Notes not available  Hospital visits: 11/05/2020 - ED Visit at [facility] for chest pain. No medication changes noted.    Objective:  Lab Results  Component Value Date   CREATININE 1.04 05/02/2021  CREATININE 0.68 11/05/2020   CREATININE 0.94 10/31/2020    Lab Results  Component Value Date   HGBA1C 5.8 10/31/2020   Last diabetic Eye exam:  Lab Results  Component Value Date/Time   HMDIABEYEEXA No Retinopathy 02/17/2017 12:00 AM    Last diabetic Foot exam: No results found for: HMDIABFOOTEX      Component Value Date/Time   CHOL 155 10/31/2020 1024    TRIG 131.0 10/31/2020 1024   HDL 49.00 10/31/2020 1024   CHOLHDL 3 10/31/2020 1024   VLDL 26.2 10/31/2020 1024   LDLCALC 80 10/31/2020 1024   LDLCALC 98 05/03/2020 1028    Hepatic Function Latest Ref Rng & Units 05/03/2020 05/23/2019 03/21/2019  Total Protein 6.1 - 8.1 g/dL 6.6 - 6.8  Albumin 3.5 - 5.2 g/dL - - 4.4  AST 10 - 35 U/L 19 20 23   ALT 6 - 29 U/L 15 17 19   Alk Phosphatase 39 - 117 U/L - - 67  Total Bilirubin 0.2 - 1.2 mg/dL 0.7 - 0.7  Bilirubin, Direct 0.0 - 0.3 mg/dL - - -    Lab Results  Component Value Date/Time   TSH 2.59 05/03/2020 10:28 AM   TSH 1.42 09/10/2016 02:55 PM    CBC Latest Ref Rng & Units 11/05/2020 10/31/2020 10/12/2019  WBC 4.0 - 10.5 K/uL 6.0 6.2 6.8  Hemoglobin 12.0 - 15.0 g/dL 14.7 82.9 56.2  Hematocrit 36.0 - 46.0 % 43.5 40.5 39.4  Platelets 150 - 400 K/uL 134(L) 127.0(L) 134.0(L)    No results found for: VD25OH  Clinical ASCVD: No  The ASCVD Risk score (Arnett DK, et al., 2019) failed to calculate for the following reasons:   The 2019 ASCVD risk score is only valid for ages 29 to 53    Other:   DEXA 03/25/2021: AP Spine L1-L4 03/25/2021 Normal 5.3   DualFemur Neck Right 03/25/2021 Normal -0.6  DualFemur Neck Right 03/16/2018 Normal -0.3    DualFemur Total Mean 03/25/2021 Normal 0.7  DualFemur Total Mean 03/16/2018 Normal 0.9    Social History   Tobacco Use  Smoking Status Never  Smokeless Tobacco Never   BP Readings from Last 3 Encounters:  05/02/21 140/84  01/01/21 126/72  12/19/20 138/83   Pulse Readings from Last 3 Encounters:  05/02/21 78  01/01/21 91  12/19/20 79   Wt Readings from Last 3 Encounters:  05/02/21 153 lb 12.8 oz (69.8 kg)  01/01/21 158 lb 2 oz (71.7 kg)  12/27/20 149 lb (67.6 kg)    Assessment: Review of patient past medical history, allergies, medications, health status, including review of consultants reports, laboratory and other test data, was performed as part of comprehensive evaluation and  provision of chronic care management services.   SDOH:  (Social Determinants of Health) assessments and interventions performed:      CCM Care Plan  Allergies  Allergen Reactions   Bee Venom Anaphylaxis   Doxycycline Hives and Itching   Penicillins Itching, Nausea And Vomiting and Swelling    syncope   Bee Pollen     Other reaction(s): Unknown   Azithromycin Itching and Rash   Cerumenex [Trolamine (Triethanolamine)] Itching and Rash   Climara [Estradiol] Itching and Rash   Nickel Rash   Sertraline Other (See Comments)    Dry Mouth    Medications Reviewed Today     Reviewed by Henrene Pastor, RPH-CPP (Pharmacist) on 08/30/21 at 1252  Med List Status: <None>   Medication Order Taking? Sig Documenting Provider Last Dose Status Informant  acetaminophen (TYLENOL) 500 MG tablet [id number] Yes Take 500-1,000 mg by mouth daily as needed. [provider] Taking Active   Apple Cid Vn-Grn Tea-Bit Or-Cr (APPLE CIDER VINEGAR PLUS) TABS [id number] Yes See admin instructions. [provider] Taking Active   blood glucose meter kit and supplies [id number] Yes Dispense based on patient and insurance preference. Check blood sugars no more than twice daily [name], MD Taking Active   brimonidine (ALPHAGAN) 0.15 % ophthalmic solution [id number] Yes Place 1 drop into both eyes 3 (three) times daily. [provider] Taking Active   Calcium-Vitamin D (CALCIUM + D PO) [id number] Yes Take 2 tablets by mouth daily with lunch. Calcium 600mg   Vitamin D 800 Units [provider] Taking Active Self  carvedilol (COREG) 6.25 MG tablet [id number] Yes TAKE 1 TABLET BY MOUTH 2 TIMES DAILY WITH A MEAL. [name], MD Taking Active   cetirizine (ZYRTEC) 10 MG tablet [id number] Yes Take 10 mg by mouth daily. [name], MD Taking Active   Coenzyme Q10-Vitamin E (QUNOL ULTRA COQ10) 100-150 MG-UNIT CAPS [id number] Yes Take 1 tablet by mouth daily. [provider] Taking Active    cycloSPORINE (RESTASIS) 0.05 % ophthalmic emulsion [id number] Yes Place 1 drop into both eyes 2 (two) times daily. [provider] Taking Active            Med Note Clydie Braun, Glenna Durand   Wed May 22, 2021  2:22 PM) Approved thru [employer] patient assistance 05/17/2021 thru 06/29/2022 (delivered to [place] office)   dextromethorphan (DELSYM) 30 MG/5ML liquid [id number] Yes Take 15 mg by mouth as needed for cough. [provider] Taking Active            Med Note Clydie Braun, Kaitlyn Franko B   Wed Dec 05, 2020 10:29 AM)    diclofenac Sodium (VOLTAREN) 1 % GEL [id number] Yes Apply topically 4 (four) times daily. If needed [provider] Taking Active   diphenhydrAMINE (BENADRYL) 12.5 MG/5ML liquid [id number] Yes Take 25 mg by mouth every 6 (six) hours as needed for allergies. Reported on 08/06/2015 [provider] Taking Active            Med Note Clydie Braun, Gurbani Figge B   Wed Dec 05, 2020 10:25 AM)    EPINEPHrine (EPIPEN 2-PAK) 0.3 mg/0.3 mL IJ SOAJ injection [id number] Yes Inject 0.3 mLs (0.3 mg total) into the muscle once. [name], MD Taking Active            Med Note (CANTER, [facility] D   Tue Mar 09, 2018 10:08 AM) PRN  ezetimibe (ZETIA) 10 MG tablet [id number] Yes TAKE 1 TABLET BY MOUTH EVERY DAY [name], MD Taking Active   famotidine (PEPCID) 20 MG tablet [id number] Yes Take 20 mg by mouth 2 (two) times daily. [name], MD Taking Active   fluticasone [facility]) 50 MCG/ACT nasal spray [id number] Yes Place 1-2 sprays into both nostrils daily as needed for allergies or rhinitis. [provider] Taking Active Self           Med Note Clydie Braun, Glenna Durand   Wed Dec 05, 2020 10:31 AM)    Gluc-Chonn-MSM-Boswellia-Vit D (GLUCOSAMINE CHOND TRIPLE/VIT D PO) [id number] Yes Take 2 tablets by mouth daily. [provider] Taking Active   hydrOXYzine (ATARAX/VISTARIL) 10 MG tablet [id number] Yes 1-2 tablets [provider] Taking Active   Lancets [facility] Larose Kells PLUS  [place]) MISC [id number] Yes CHECK BLOOD SUGAR NO MORE THAN TWICE DAILY Bradd Canary,  MD Taking Active   latanoprost (XALATAN) 0.005 % ophthalmic solution [id number] Yes Place 1 drop into the left eye at bedtime. Dr. Harriette Bouillon [provider] Taking Active   Magnesium 250 MG TABS [id number] Yes 1 tablet with a meal [provider] Taking Active   meclizine (ANTIVERT) 25 MG tablet [id number] Yes Take 25 mg by mouth daily as needed for dizziness. [provider] Taking Active   Methylcellulose, Laxative, (SOLUBLE FIBER PO) [id number] Yes Take by mouth daily as needed. Benefiber [provider] Taking Active   mometasone (ELOCON) 0.1 % cream [id number] Yes 1 application [provider] Taking Active   montelukast (SINGULAIR) 10 MG tablet [id number] Yes Take 1 tablet by mouth daily. [provider] Taking Active   Multiple Vitamin (MULTIVITAMIN WITH MINERALS) TABS [id number] Yes Take 1 tablet by mouth daily with breakfast. [provider] Taking Active Self  naproxen sodium (ALEVE) 220 MG tablet [id number] Yes Take 220 mg by mouth daily as needed. [provider] Taking Active            Med Note Clydie Braun, Juanna Cao Dec 05, 2020 10:08 AM)    Koren Bound test strip [id number] Yes CHECK BLOOD SUGAR NO MORE THAN TWICE DAILY Bradd Canary, MD Taking Active   Polyethyl Glycol-Propyl Glycol 0.4-0.3 % SOLN [id number] Yes Place 1 drop into both eyes daily as needed (dry eyes). Systane [provider] Taking Active Self  spironolactone (ALDACTONE) 25 MG tablet [id number] Yes TAKE 1 TABLET (25 MG TOTAL) BY MOUTH DAILY. 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   GERD (gastroesophageal reflux disease) 06/05/2016   PCP NOTES  >>>>>>>>>>>>>>>>>>>>>>>>> 02/04/2016   Severe allergic reaction 08/17/2014   Allergic rhinitis 12/01/2011   Thrombocytopenia (HCC) 08/27/2011   Hyperlipidemia 01/20/2011   Annual physical exam 09/18/2010   Hyperglycemia 09/17/2009   Backache 07/10/2008   Anxiety and depression 01/19/2008   HX OF GALLSTONE 10/20/2006   Essential hypertension 09/29/2006    Immunization History  Administered Date(s) Administered   Fluad Quad(high Dose 65+) 03/21/2019, 03/25/2021   Influenza Split 05/02/2014   Influenza Whole 05/14/2007, 04/09/2009, 04/03/2010   Influenza, High Dose Seasonal PF 04/06/2015, 09/21/2015, 09/19/2016, 09/21/2017, 04/08/2018, 02/24/2020, 11/09/2020   Influenza, Seasonal, Injecte, Preservative Fre 04/12/2013   Influenza-Unspecified 08/17/2017, 04/09/2021   PFIZER Comirnaty(Gray Top)Covid-19 Tri-Sucrose Vaccine 10/31/2020   PFIZER(Purple Top)SARS-COV-2 Vaccination 09/16/2019, 10/11/2019, 11/11/2019, 05/05/2020   Pfizer Covid-19 Vaccine Bivalent Booster 22yrs & up 05/02/2021   Pneumococcal Conjugate-13 11/30/2013   Pneumococcal Polysaccharide-23 06/30/2001, 05/14/2007, 04/25/2015, 09/21/2015, 09/19/2016, 09/21/2017, 09/08/2018, 11/09/2018, 11/09/2020   Td 10/08/2004   Tdap 04/12/2013   Zoster Recombinat (Shingrix) 03/11/2018, 05/20/2018   Zoster, Live 06/30/2005    Conditions to be addressed/monitored: HTN, HLD, Anxiety, Depression and pre DM; GERD; urticaria / allergies  Care Plan : General Pharmacy (Adult)  Updates made by Henrene Pastor, RPH-CPP since 08/30/2021 12:00 AM     Problem: pre diabetes; HTN; hyperlipidemia; glaucome; urticaria   Priority: High  Onset Date: 08/28/2020     Long-Range Goal: Medication and chronic care management   Start Date: 12/05/2020  Recent Progress: On track  Note:   CCurrent Barriers:  Chronic Disease Management support, education, and care coordination needs related to Pre-DM, HTN, HLD, GERD, Allergic  Rhinitis, Glaucoma Unable to  maintain control of HTN Difficulty affording Restasis now that she is in medicare coverage gap.   Pharmacist Clinical Goal(s):  Over the next 180 days, patient will achieve adherence to monitoring guidelines and medication adherence to achieve therapeutic efficacy achieve control of HTN as evidenced by BP <140/90 maintain control of pre DM and hyperlipidemia as evidenced by A1c <6.5% and LDL <100  through collaboration with PharmD and provider.  Coordinate with clinical pharmacist and provider to find cost solutions for restasis.   Interventions: 1:1 collaboration with [name], MD regarding development and update of comprehensive plan of care as evidenced by provider attestation and co-signature Inter-disciplinary care team collaboration (see longitudinal plan of care) Comprehensive medication review performed; medication list updated in electronic medical record  Pre - Diabetes: Controlled; A1c goal <6.5% Current treatment: diet and exercise  Current glucose readings: did not get updated readings today Denies hypoglycemic/hyperglycemic symptoms Current meal patterns: Limiting intake of sugary drinks except 1 or 2 sodas per month Limiting intake of CHO - no french fries, 1/2 sweet potato once per week; occasionally has rice but small serving Current exercise: 1 or 2 times per week - walking Interventions: (addressed at previous visit) Discussed diet and exercise - Continue to limit intake of sugar and starchy foods Recommended increase frequency of walking / exercise to 4 times per week.  Reviewed home blood glucose readings and reviewed goals  Fasting blood glucose goal (before meals) = 80 to 130 Blood glucose goal after a meal = less than 180 Recommended patient continue to check blood glucose 1 or 2 times per week, document, and provide at future appointments Contact provider with any episodes of hypoglycemia  Hypertension: Variable - some home blood pressure readings slightly  above goal; Recent office blood pressure reading have been at goal BP goal <140/90  No hypotensive/hypertensive symptoms Recent home BP readings: did not get updated readings today Current regimen:  Carvedilol 6.25mg  twice a day Spironolactone 25mg  daily each morning Interventions:  Continue to check blood pressure 2 to 3 times per week  Ensure daily salt intake < 2300 mg/day Continue current regimen for blood pressure  Hyperlipidemia: Controlled; LDL goal <100 Current treatment: Ezetimibe 10mg  daily Previous therapy: atorvastatin - stopped by dermatologist due to suspected was causing urticaria  Interventions:  Maintain LDL less than 100 Continue current therapy for lowering cholesterol  Increase exercise as able to goal of 150 minutes per week (consider [employer] program or walking at hardware store like [employer] or [employer])    Medication management [facility] Patient is currently in Medicare Coverage gap - cost of 90 days of Restasis was >$400. She last filled only 30 days and cost was $74.  Interventions Comprehensive medication review performed. Continue current medication management strategy Coordinated with patient, Dr Clydene Pugh and medication assistance program for Restasis eye drops. Patient has been approved to receive Restasis from [employer] patient assistance program thru 06/29/2022.  Reviewed ingredients of Wellness Formula Advanced Immune Supplement. There are several herbal products in this supplement. Some like Coptis Rhizome Extract, Kudzu Root or Astragalus Root Extract could interfere with metabolism of carvedilol thru p-glucoprotein or CYP450 2D6 substrate interactions. Advised patient to take this herbal medication.  Patient Goals/Self-Care Activities Over the next 180 days, patient will:  take medications as prescribed,  check glucose 2 to 3 times per week, document, and provide at future appointments,  check blood pressure 2 to 3 times per  week, document, and  provide at future appointments, and  target a minimum of 150 minutes of moderate intensity exercise weekly  Follow Up Plan: Telephone follow up appointment with care management team member scheduled for:  3 months     Medication Assistance: Coordinated with patient, Dr Clydene Pugh and medication assistance program for Restasis eye drops. Patient has been approved to receive Restasis from [employer] patient assistance program starting 05/17/2021 thru 06/29/2022. First shipment was shipped 05/17/2021 and received 05/20/2021. Patient states she has not received. After some research, it was shipped to her providers office - Dr Harriette Bouillon. Notified patient and office. [facility]'s preferred pharmacy [facility]:  [id number] [place], [place] - [address] AT [facility] [address] [place] [address] Phone: [phone] Fax: [phone]    Follow Up:  Patient agrees to Care Plan and Follow-up.  Plan: Telephone follow up appointment with care management team member scheduled for:  3 months   Henrene Pastor, PharmD Clinical Pharmacist [facility] [phone]

## 2021-08-30 NOTE — Patient Instructions (Signed)
Monique [name] ?It was a pleasure speaking with you today.  ? ?If you have any questions or concerns, please feel free to contact me either at the phone number below or with a MyChart message.  ? ?Keep up the good work! ? ?Cherre Robins, PharmD ?Clinical Pharmacist ?[facility] ?[place] ?[phone] (direct line)  ?[phone] (main office number) ? ? ?Patient declined to have care plan mailed to her as she has received copy of care plan 1 week ago   ?

## 2021-09-06 ENCOUNTER — Other Ambulatory Visit: Payer: Self-pay | Admitting: Internal Medicine

## 2021-09-25 ENCOUNTER — Telehealth: Payer: Self-pay | Admitting: Pharmacist

## 2021-09-25 NOTE — Telephone Encounter (Signed)
Patient called to ask if was [facility] to take Ester-C 24 hour immune support with there current medications. She also asked if she should stop magnesium because magnesium is listed on the Ester - C 24 hour immune support ingredients.  ?Reviewed label below. Contains less than 2% of magnesium. Recommended she can continue her current magnesium '250mg'$  daily supplement.  ? ?

## 2021-10-03 ENCOUNTER — Encounter: Payer: PPO | Admitting: Internal Medicine

## 2021-10-22 ENCOUNTER — Encounter: Payer: Self-pay | Admitting: Internal Medicine

## 2021-10-25 ENCOUNTER — Telehealth: Payer: Self-pay | Admitting: Pharmacist

## 2021-10-25 MED ORDER — EZETIMIBE 10 MG PO TABS: 10.0000 mg | ORAL_TABLET | Freq: Every day | ORAL | 0 refills | Status: DC
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
## 2021-11-26 ENCOUNTER — Ambulatory Visit: Payer: PPO | Admitting: Pharmacist

## 2021-11-26 DIAGNOSIS — Z79899 Other long term (current) drug therapy: Secondary | ICD-10-CM

## 2021-11-26 NOTE — Chronic Care Management (AMB) (Signed)
  Care Management   Follow Up Note   11/26/2021 Name: Monique [name] MRN: [id number] DOB: [birth year]   Referred by: Colon Branch, MD Reason for referral : No chief complaint on file.  Successful contact was made with the patient to discuss care management and care coordination services. Patient thought she would be back in [facility] by now but her son is still in [facility] in [place], [place].  Unable to complete Chronic Care Management services since patient is out of state.    Follow Up Plan: When she moves back to [place] we can re-engage as needed.   Cherre Robins, PharmD Clinical Pharmacist [facility]

## 2021-12-24 DIAGNOSIS — Z87891 Personal history of nicotine dependence: Secondary | ICD-10-CM | POA: Diagnosis not present

## 2021-12-24 DIAGNOSIS — H109 Unspecified conjunctivitis: Secondary | ICD-10-CM | POA: Diagnosis not present

## 2022-01-02 ENCOUNTER — Ambulatory Visit (INDEPENDENT_AMBULATORY_CARE_PROVIDER_SITE_OTHER): Payer: PPO

## 2022-01-02 DIAGNOSIS — Z0000 Encounter for general adult medical examination without abnormal findings: Secondary | ICD-10-CM | POA: Diagnosis not present

## 2022-01-02 DIAGNOSIS — H04123 Dry eye syndrome of bilateral lacrimal glands: Secondary | ICD-10-CM | POA: Diagnosis not present

## 2022-01-02 NOTE — Patient Instructions (Addendum)
Monique [name] , Thank you for taking time to come for your Medicare Wellness Visit. I appreciate your ongoing commitment to your health goals. Please review the following plan we discussed and let me know if I can assist you in the future.   Screening recommendations/referrals: Colonoscopy: no longer needed Mammogram: 03/25/21 due 03/25/22 Bone Density: 03/25/21 due 03/26/23 Recommended yearly ophthalmology/optometry visit for glaucoma screening and checkup Recommended yearly dental visit for hygiene and checkup  Vaccinations: Influenza vaccine: up to date Pneumococcal vaccine: up to date Tdap vaccine: Due-May obtain vaccine at our office or your local pharmacy.  Shingles vaccine: up to date   Covid-19:completed  Advanced directives: yes, not on file  Conditions/risks identified: see problem list   Next appointment: Follow up in one year for your annual wellness visit 01/05/23   Preventive Care 65 Years and Older, Female Preventive care refers to lifestyle choices and visits with your health care provider that can promote health and wellness. What does preventive care include? A yearly physical exam. This is also called an annual well check. Dental exams once or twice a year. Routine eye exams. Ask your health care provider how often you should have your eyes checked. Personal lifestyle choices, including: Daily care of your teeth and gums. Regular physical activity. Eating a healthy diet. Avoiding tobacco and drug use. Limiting alcohol use. Practicing safe sex. Taking low-dose aspirin every day. Taking vitamin and mineral supplements as recommended by your health care provider. What happens during an annual well check? The services and screenings done by your health care provider during your annual well check will depend on your age, overall health, lifestyle risk factors, and family history of disease. Counseling  Your health care provider may ask you questions about  your: Alcohol use. Tobacco use. Drug use. Emotional well-being. Home and relationship well-being. Sexual activity. Eating habits. History of falls. Memory and ability to understand (cognition). Work and work Statistician. Reproductive health. Screening  You may have the following tests or measurements: Height, weight, and BMI. Blood pressure. Lipid and cholesterol levels. These may be checked every 5 years, or more frequently if you are over 23 years old. Skin check. Lung cancer screening. You may have this screening every year starting at age 24 if you have a 30-pack-year history of smoking and currently smoke or have quit within the past 15 years. Fecal occult blood test (FOBT) of the stool. You may have this test every year starting at age 78. Flexible sigmoidoscopy or colonoscopy. You may have a sigmoidoscopy every 5 years or a colonoscopy every 10 years starting at age 47. Hepatitis C blood test. Hepatitis B blood test. Sexually transmitted disease (STD) testing. Diabetes screening. This is done by checking your blood sugar (glucose) after you have not eaten for a while (fasting). You may have this done every 1-3 years. Bone density scan. This is done to screen for osteoporosis. You may have this done starting at age 58. Mammogram. This may be done every 1-2 years. Talk to your health care provider about how often you should have regular mammograms. Talk with your health care provider about your test results, treatment options, and if necessary, the need for more tests. Vaccines  Your health care provider may recommend certain vaccines, such as: Influenza vaccine. This is recommended every year. Tetanus, diphtheria, and acellular pertussis (Tdap, Td) vaccine. You may need a Td booster every 10 years. Zoster vaccine. You may need this after age 30. Pneumococcal 13-valent conjugate (PCV13) vaccine. One dose  is recommended after age 74. Pneumococcal polysaccharide (PPSV23) vaccine.  One dose is recommended after age 60. Talk to your health care provider about which screenings and vaccines you need and how often you need them. This information is not intended to replace advice given to you by your health care provider. Make sure you discuss any questions you have with your health care provider. Document Released: 07/13/2015 Document Revised: 03/05/2016 Document Reviewed: 04/17/2015 Elsevier Interactive Patient Education  2017 [place] Prevention in the Home Falls can cause injuries. They can happen to people of all ages. There are many things you can do to make your home safe and to help prevent falls. What can I do on the outside of my home? Regularly fix the edges of walkways and driveways and fix any cracks. Remove anything that might make you trip as you walk through a door, such as a raised step or threshold. Trim any bushes or trees on the path to your home. Use bright outdoor lighting. Clear any walking paths of anything that might make someone trip, such as rocks or tools. Regularly check to see if handrails are loose or broken. Make sure that both sides of any steps have handrails. Any raised decks and porches should have guardrails on the edges. Have any leaves, snow, or ice cleared regularly. Use sand or salt on walking paths during winter. Clean up any spills in your garage right away. This includes oil or grease spills. What can I do in the bathroom? Use night lights. Install grab bars by the toilet and in the tub and shower. Do not use towel bars as grab bars. Use non-skid mats or decals in the tub or shower. If you need to sit down in the shower, use a plastic, non-slip stool. Keep the floor dry. Clean up any water that spills on the floor as soon as it happens. Remove soap buildup in the tub or shower regularly. Attach bath mats securely with double-sided non-slip rug tape. Do not have throw rugs and other things on the floor that can make  you trip. What can I do in the bedroom? Use night lights. Make sure that you have a light by your bed that is easy to reach. Do not use any sheets or blankets that are too big for your bed. They should not hang down onto the floor. Have a firm chair that has side arms. You can use this for support while you get dressed. Do not have throw rugs and other things on the floor that can make you trip. What can I do in the kitchen? Clean up any spills right away. Avoid walking on wet floors. Keep items that you use a lot in easy-to-reach places. If you need to reach something above you, use a strong step stool that has a grab bar. Keep electrical cords out of the way. Do not use floor polish or wax that makes floors slippery. If you must use wax, use non-skid floor wax. Do not have throw rugs and other things on the floor that can make you trip. What can I do with my stairs? Do not leave any items on the stairs. Make sure that there are handrails on both sides of the stairs and use them. Fix handrails that are broken or loose. Make sure that handrails are as long as the stairways. Check any carpeting to make sure that it is firmly attached to the stairs. Fix any carpet that is loose or worn. Avoid  having throw rugs at the top or bottom of the stairs. If you do have throw rugs, attach them to the floor with carpet tape. Make sure that you have a light switch at the top of the stairs and the bottom of the stairs. If you do not have them, ask someone to add them for you. What else can I do to help prevent falls? Wear shoes that: Do not have high heels. Have rubber bottoms. Are comfortable and fit you well. Are closed at the toe. Do not wear sandals. If you use a stepladder: Make sure that it is fully opened. Do not climb a closed stepladder. Make sure that both sides of the stepladder are locked into place. Ask someone to hold it for you, if possible. Clearly mark and make sure that you can  see: Any grab bars or handrails. First and last steps. Where the edge of each step is. Use tools that help you move around (mobility aids) if they are needed. These include: Canes. Walkers. Scooters. Crutches. Turn on the lights when you go into a dark area. Replace any light bulbs as soon as they burn out. Set up your furniture so you have a clear path. Avoid moving your furniture around. If any of your floors are uneven, fix them. If there are any pets around you, be aware of where they are. Review your medicines with your doctor. Some medicines can make you feel dizzy. This can increase your chance of falling. Ask your doctor what other things that you can do to help prevent falls. This information is not intended to replace advice given to you by your health care provider. Make sure you discuss any questions you have with your health care provider. Document Released: 04/12/2009 Document Revised: 11/22/2015 Document Reviewed: 07/21/2014 Elsevier Interactive Patient Education  2017 [employer].

## 2022-01-02 NOTE — Progress Notes (Signed)
Subjective:   Monique [name] is a 81 y.o. female who presents for Medicare Annual (Subsequent) preventive examination.  I connected with  Monique [name] on 01/02/22 by a audio enabled telemedicine application and verified that I am speaking with the correct person using two identifiers.  Patient Location: Home  Provider Location: Office/Clinic  I discussed the limitations of evaluation and management by telemedicine. The patient expressed understanding and agreed to proceed.   Review of Systems     Cardiac Risk Factors include: advanced age (>14mn, >>39women);hypertension;dyslipidemia     Objective:    Today's Vitals   01/02/22 1459  PainSc: 6    There is no height or weight on file to calculate BMI.     01/02/2022    2:55 PM 12/27/2020    2:24 PM 11/05/2020   11:08 AM 08/22/2019   10:00 AM 08/19/2018   11:24 AM 08/17/2017   11:31 AM 06/07/2016   12:21 PM  Advanced Directives  Does Patient Have a Medical Advance Directive? Yes Yes No Yes Yes Yes Yes  Type of AParamedicof [place]elmontLiving will;Out of facility DNR (pink MOST or yellow form) HGalliaLiving will  HRupertLiving will HHatchLiving will HColumbusLiving will HHosmerLiving will  Does patient want to make changes to medical advance directive? No - Patient declined   No - Patient declined No - Patient declined No - Patient declined No - Patient declined  Copy of HSun Valleyin Chart? No - copy requested No - copy requested  No - copy requested No - copy requested No - copy requested No - copy requested  Would patient like information on creating a medical advance directive?    No - Patient declined       Current Medications (verified) Outpatient Encounter Medications as of 01/02/2022  Medication Sig   acetaminophen (TYLENOL) 500 MG tablet Take 500-1,000 mg by mouth daily  as needed.   Apple Cid Vn-Grn Tea-Bit Or-Cr (APPLE CIDER VINEGAR PLUS) TABS See admin instructions.   blood glucose meter kit and supplies Dispense based on patient and insurance preference. Check blood sugars no more than twice daily   Calcium-Vitamin D (CALCIUM + D PO) Take 2 tablets by mouth daily with lunch. Calcium 6066m Vitamin D 800 Units   carvedilol (COREG) 6.25 MG tablet TAKE 1 TABLET BY MOUTH 2 TIMES DAILY WITH A MEAL.   cetirizine (ZYRTEC) 10 MG tablet Take 10 mg by mouth daily.   Coenzyme Q10-Vitamin E (QUNOL ULTRA COQ10) 100-150 MG-UNIT CAPS Take 1 tablet by mouth daily.   cycloSPORINE (RESTASIS) 0.05 % ophthalmic emulsion Place 1 drop into both eyes 2 (two) times daily.   dextromethorphan (DELSYM) 30 MG/5ML liquid Take 15 mg by mouth as needed for cough.   diclofenac Sodium (VOLTAREN) 1 % GEL Apply topically 4 (four) times daily. If needed   diphenhydrAMINE (BENADRYL) 12.5 MG/5ML liquid Take 25 mg by mouth every 6 (six) hours as needed for allergies. Reported on 08/06/2015   EPINEPHrine (EPIPEN 2-PAK) 0.3 mg/0.3 mL IJ SOAJ injection Inject 0.3 mLs (0.3 mg total) into the muscle once.   ezetimibe (ZETIA) 10 MG tablet Take 1 tablet (10 mg total) by mouth daily.   famotidine (PEPCID) 20 MG tablet Take 20 mg by mouth 2 (two) times daily.   fluticasone (FLONASE) 50 MCG/ACT nasal spray Place 1-2 sprays into both nostrils daily as needed for  allergies or rhinitis.   Gluc-Chonn-MSM-Boswellia-Vit D (GLUCOSAMINE CHOND TRIPLE/VIT D PO) Take 2 tablets by mouth daily.   hydrOXYzine (ATARAX/VISTARIL) 10 MG tablet    Lancets (ONETOUCH DELICA PLUS [id number]) MISC CHECK BLOOD SUGAR NO MORE THAN TWICE DAILY   latanoprost (XALATAN) 0.005 % ophthalmic solution Place 1 drop into the left eye at bedtime. Dr. [name]   Magnesium 250 MG TABS 1 tablet with a meal   meclizine (ANTIVERT) 25 MG tablet Take 25 mg by mouth daily as needed for dizziness.   Methylcellulose, Laxative, (SOLUBLE FIBER PO) Take by  mouth daily as needed. Benefiber   mometasone (ELOCON) 0.1 % cream 1 application   montelukast (SINGULAIR) 10 MG tablet Take 1 tablet by mouth daily.   Multiple Vitamin (MULTIVITAMIN WITH MINERALS) TABS Take 1 tablet by mouth daily with breakfast.   naproxen sodium (ALEVE) 220 MG tablet Take 220 mg by mouth daily as needed.   ONETOUCH ULTRA test strip CHECK BLOOD SUGAR NO MORE THAN TWICE DAILY   Polyethyl Glycol-Propyl Glycol 0.4-0.3 % SOLN Place 1 drop into both eyes daily as needed (dry eyes). Systane   spironolactone (ALDACTONE) 25 MG tablet TAKE 1 TABLET (25 MG TOTAL) BY MOUTH DAILY.   sulfacetamide (BLEPH-10) 10 % ophthalmic solution SMARTSIG:2 Drop(s) Left Eye Every 6 Hours   vitamin C (ASCORBIC ACID) 500 MG tablet Take 500 mg by mouth daily.   [DISCONTINUED] brimonidine (ALPHAGAN) 0.15 % ophthalmic solution Place 1 drop into both eyes 3 (three) times daily.   brimonidine (ALPHAGAN) 0.2 % ophthalmic solution 1 drop 2 (two) times daily.   No facility-administered encounter medications on file as of 01/02/2022.    Allergies (verified) Bee venom, Doxycycline, Penicillins, Bee pollen, Azithromycin, Cerumenex [trolamine (triethanolamine)], Climara [estradiol], Nickel, and Sertraline   History: Past Medical History:  Diagnosis Date   Allergy    Anemia    when had uterine fibroids   Anxiety    Cataract    very small   Cholelithiasis    Closed fracture of base of fifth metacarpal bone of left hand 06/01/2018   DJD (degenerative joint disease)    Dry eye syndrome    Headache(784.0)    Hyperlipidemia 01/20/2011   Hypertension 09/29/2006   Substance abuse ([place])    Thrombocytopenia ([place]) 08/27/2011   hematology evaluation 08-2011, return prn   Urticaria    Vasomotor rhinitis    Past Surgical History:  Procedure Laterality Date   ABDOMINAL HYSTERECTOMY     no oophorectomy   APPENDECTOMY     CHOLECYSTECTOMY N/A 06/06/2016   Procedure: LAPAROSCOPIC CHOLECYSTECTOMY WITH INTRAOPERATIVE  CHOLANGIOGRAM;  Surgeon: Johnathan Hausen, MD;  Location: [facility];  Service: General;  Laterality: N/A;   DILATION AND CURETTAGE OF UTERUS     EYE SURGERY Bilateral 05/06/2019   HERNIA REPAIR     mole removals     TONSILLECTOMY     UMBILICAL HERNIA REPAIR     Family History  Problem Relation Age of Onset   Coronary artery disease Father    Cancer Father        unsure of type   Other Father        Guillain- barre syndrome   Diabetes Other        uncles    Hypertension Mother    Anxiety disorder Mother    Depression Mother    Prostate cancer Other        grandfather   Breast cancer Sister 11   Depression Maternal Uncle  Colon cancer Neg Hx    Colon polyps Neg Hx    Rectal cancer Neg Hx    Stomach cancer Neg Hx    Social History   Socioeconomic History   Marital status: Widowed    Spouse name: Not on file   Number of children: 2   Years of education: Not on file   Highest education level: Not on file  Occupational History   Occupation: retired Theme park manager     Comment: was a Theme park manager  Tobacco Use   Smoking status: Never   Smokeless tobacco: Never  Vaping Use   Vaping Use: Never used  Substance and Sexual Activity   Alcohol use: No   Drug use: No   Sexual activity: Not Currently    Birth control/protection: Surgical  Other Topics Concern   Not on file  Social History Narrative   Lost her mother at 61 (april 2021)   husband had prostate ca , passed away September 12, 2013     Lives by herself   2 sons:  [place] , [place]   Drives as of [phone]   Social Determinants of Health   Financial Resource Strain: Medium Risk (05/09/2021)   Overall Financial Resource Strain (CARDIA)    Difficulty of Paying Living Expenses: Somewhat hard  Food Insecurity: No Food Insecurity (12/27/2020)   Hunger Vital Sign    Worried About Running Out of Food in the Last Year: Never true    [place] in the Last Year: Never true  Transportation Needs: No Transportation Needs (12/27/2020)    PRAPARE - Hydrologist (Medical): No    Lack of Transportation (Non-Medical): No  Physical Activity: Insufficiently Active (04/09/2021)   Exercise Vital Sign    Days of Exercise per Week: 2 days    Minutes of Exercise per Session: 20 min  Stress: No Stress Concern Present (12/27/2020)   [place]    Feeling of Stress : Not at all  Social Connections: Moderately Integrated (12/27/2020)   Social Connection and Isolation Panel [NHANES]    Frequency of Communication with Friends and Family: More than three times a week    Frequency of Social Gatherings with Friends and Family: More than three times a week    Attends Religious Services: More than 4 times per year    Active Member of [employer] or Organizations: Yes    Attends Archivist Meetings: More than 4 times per year    Marital Status: Widowed    Tobacco Counseling Counseling given: Not Answered   Clinical Intake:  Pre-visit preparation completed: Yes  Pain : 0-10 Pain Score: 6  Pain Type: Chronic pain Pain Location: Elbow Pain Descriptors / Indicators: Aching, Dull, Sore Pain Onset: More than a month ago Pain Frequency: Intermittent     BMI - recorded: 29.06 Nutritional Status: BMI 25 -29 Overweight Nutritional Risks: None Diabetes: No  How often do you need to have someone help you when you read instructions, pamphlets, or other written materials from your doctor or pharmacy?: 1 - Never  Diabetic?no  Interpreter Needed?: No  Information entered by :: Carynn Felling   Activities of Daily Living    01/02/2022    3:03 PM  In your present state of health, do you have any difficulty performing the following activities:  Hearing? 1  Vision? 1  Difficulty concentrating or making decisions? 0  Walking or climbing stairs? 0  Dressing or  bathing? 0  Doing errands, shopping? 0  Preparing Food and eating ? N  Using  the Toilet? N  In the past six months, have you accidently leaked urine? Y  Do you have problems with loss of bowel control? N  Managing your Medications? N  Managing your Finances? N  Housekeeping or managing your Housekeeping? N    Patient Care Team: Colon Branch, MD as PCP - General Webb Laws, OD as Referring Physician (Optometry) Mosetta Anis, MD as Referring Physician (Allergy) Sanjuana Kava, MD as Referring Physician (Obstetrics and Gynecology) Hilarie Fredrickson, Lajuan Lines, MD as Consulting Physician (Gastroenterology) Norma Fredrickson, MD as Consulting Physician (Psychiatry) [place], Jennefer Bravo, MD as Referring Physician (Dermatology) [facility] (Orthopedic Surgery) Cherre Robins, RPH-CPP (Pharmacist)  Indicate any recent Medical Services you may have received from other than [facility] providers in the past year (date may be approximate).     Assessment:   This is a routine wellness examination for [place].  Hearing/Vision screen No results found.  Dietary issues and exercise activities discussed: Current Exercise Habits: Home exercise routine, Type of exercise: walking;stretching, Time (Minutes): 15, Frequency (Times/Week): 7, Weekly Exercise (Minutes/Week): 105, Intensity: Mild, Exercise limited by: None identified   Goals Addressed   None    Depression Screen    01/02/2022    2:58 PM 01/01/2021   10:43 AM 12/27/2020    2:29 PM 10/31/2020   10:31 AM 11/18/2019    8:51 AM 08/22/2019   10:07 AM 03/21/2019    9:25 AM  PHQ 2/9 Scores  PHQ - 2 Score 0 0 0 0 0 0 0  PHQ- 9 Score  0  0 0  0    Fall Risk    01/02/2022    2:58 PM 01/01/2021   10:19 AM 12/27/2020    2:26 PM 10/31/2020    9:54 AM 08/22/2019   10:07 AM  [place] in the past year? 0 0 0 0 0  Number falls in past yr: 0 0 0 0 0  Injury with Fall? 0 0 0 0 0  Risk for fall due to : No Fall Risks      Follow up Falls evaluation completed Falls evaluation completed Falls prevention discussed  Education provided;Falls  prevention discussed    FALL RISK PREVENTION PERTAINING TO THE HOME:  Any stairs in or around the home? No  If so, are there any without handrails?  N/a Home free of loose throw rugs in walkways, pet beds, electrical cords, etc? Yes  Adequate lighting in your home to reduce risk of falls? Yes   ASSISTIVE DEVICES UTILIZED TO PREVENT FALLS:  Life alert? Yes  Use of a cane, walker or w/c? No  Grab bars in the bathroom? Yes  Shower chair or bench in shower? No  Elevated toilet seat or a handicapped toilet? No   TIMED UP AND GO:  Was the test performed? No .    Cognitive Function:    08/17/2017   11:35 AM  MMSE - Mini Mental State Exam  Orientation to time 5  Orientation to Place 5  Registration 3  Attention/ Calculation 5  Recall 2  Language- name 2 objects 2  Language- repeat 1  Language- follow 3 step command 3  Language- read & follow direction 1  Write a sentence 1  Copy design 1  Total score 29        01/02/2022    3:10 PM  6CIT Screen  What Year? 0 points  What time? 0 points  Count back from 20 0 points  Months in reverse 0 points  Repeat phrase 0 points    Immunizations Immunization History  Administered Date(s) Administered   Fluad Quad(high Dose 65+) 03/21/2019, 03/25/2021   Influenza Split 05/02/2014   Influenza Whole 05/14/2007, 04/09/2009, 04/03/2010   Influenza, High Dose Seasonal PF 04/06/2015, 09/21/2015, 09/19/2016, 09/21/2017, 04/08/2018, 02/24/2020, 11/09/2020   Influenza, Seasonal, Injecte, Preservative Fre 04/12/2013   Influenza-Unspecified 08/17/2017, 04/09/2021   PFIZER Comirnaty(Gray Top)Covid-19 Tri-Sucrose Vaccine 10/31/2020   PFIZER(Purple Top)SARS-COV-2 Vaccination 09/16/2019, 10/11/2019, 11/11/2019, 05/05/2020   Pfizer Covid-19 Vaccine Bivalent Booster 38yr & up 05/02/2021   Pneumococcal Conjugate-13 11/30/2013   Pneumococcal Polysaccharide-23 06/30/2001, 05/14/2007, 04/25/2015, 09/21/2015, 09/19/2016, 09/21/2017, 09/08/2018,  11/09/2018, 11/09/2020   Td 10/08/2004   Tdap 04/12/2013   Zoster Recombinat (Shingrix) 03/11/2018, 05/20/2018   Zoster, Live 06/30/2005    TDAP status: Due, Education has been provided regarding the importance of this vaccine. Advised may receive this vaccine at local pharmacy or Health Dept. Aware to provide a copy of the vaccination record if obtained from local pharmacy or Health Dept. Verbalized acceptance and understanding.  Flu Vaccine status: Up to date  Pneumococcal vaccine status: Up to date  Covid-19 vaccine status: Completed vaccines  Qualifies for Shingles Vaccine? Yes   Zostavax completed No   Shingrix Completed?: Yes  Screening Tests Health Maintenance  Topic Date Due   INFLUENZA VACCINE  01/28/2022   MAMMOGRAM  03/25/2022   TETANUS/TDAP  04/13/2023   Pneumonia Vaccine 81 Years old  Completed   DEXA SCAN  Completed   COVID-19 Vaccine  Completed   Zoster Vaccines- Shingrix  Completed   HPV VACCINES  Aged Out    Health Maintenance  There are no preventive care reminders to display for this patient.  Colorectal cancer screening: No longer required.   Mammogram status: Completed 03/25/21. Repeat every year  Bone Density status: Completed 03/25/21. Results reflect: Bone density results: NORMAL. Repeat every 2 years.  Lung Cancer Screening: (Low Dose CT Chest recommended if Age 81-80years, 30 pack-year currently smoking OR have quit w/in 15years.) does not qualify.   Lung Cancer Screening Referral: n/a  Additional Screening:  Hepatitis C Screening: does not qualify; Completed aged out  Vision Screening: Recommended annual ophthalmology exams for early detection of glaucoma and other disorders of the eye. Is the patient up to date with their annual eye exam?  Yes  Who is the provider or what is the name of the office in which the patient attends annual eye exams? [facility] If pt is not established with a provider, would they like to be referred to a provider  to establish care? No .   Dental Screening: Recommended annual dental exams for proper oral hygiene  Community Resource Referral / Chronic Care Management: CRR required this visit?  No   CCM required this visit?  No      Plan:     I have personally reviewed and noted the following in the patient's chart:   Medical and social history Use of alcohol, tobacco or illicit drugs  Current medications and supplements including opioid prescriptions.  Functional ability and status Nutritional status Physical activity Advanced directives List of other physicians Hospitalizations, surgeries, and ER visits in previous 12 months Vitals Screenings to include cognitive, depression, and falls Referrals and appointments  In addition, I have reviewed and discussed with patient certain preventive protocols, quality metrics, and best practice recommendations. A written personalized care plan for preventive services as  well as general preventive health recommendations were provided to patient.   Due to this being a telephonic visit, the after visit summary with patients personalized plan was offered to patient via mail or my-chart. Patient preferred to pick up at office at next visit.   Duard Brady [place], CMA   01/02/2022   Nurse Notes: none

## 2022-01-03 ENCOUNTER — Encounter: Payer: Self-pay | Admitting: Internal Medicine

## 2022-01-03 ENCOUNTER — Ambulatory Visit (INDEPENDENT_AMBULATORY_CARE_PROVIDER_SITE_OTHER): Payer: PPO | Admitting: Internal Medicine

## 2022-01-03 VITALS — BP 122/66 | HR 79 | Temp 98.3°F | Resp 18 | Ht 61.0 in | Wt 149.4 lb

## 2022-01-03 DIAGNOSIS — Z79899 Other long term (current) drug therapy: Secondary | ICD-10-CM

## 2022-01-03 DIAGNOSIS — B349 Viral infection, unspecified: Secondary | ICD-10-CM | POA: Diagnosis not present

## 2022-01-03 LAB — POC COVID19 BINAXNOW: SARS Coronavirus 2 Ag: NEGATIVE

## 2022-01-03 LAB — POCT INFLUENZA A/B
Influenza A, POC: NEGATIVE
Influenza B, POC: NEGATIVE

## 2022-01-03 NOTE — Progress Notes (Unsigned)
Subjective:    Patient ID: Monique [name], female    DOB: 07-10-1940, 81 y.o.   MRN: [id number]  DOS:  01/03/2022 Type of visit - description: Acute, here with her son  Initially, she reported the symptoms started 2 days ago consequently we ordered COVID and flu test that were negative. On further conversation, she actually started with respiratory symptoms approximately 2 weeks ago. Sore throat, cough with sputum production, greenish in color.  Some nasal discharge.  She was in [facility], saw a doctor there, prescribed [employer]  At this point she is a little better, sputum and nasal discharge have decrease. Cough has improved.  Denies fever chills No chest pain or difficulty breathing She still feels tired  Review of Systems See above   Past Medical History:  Diagnosis Date   Allergy    Anemia    when had uterine fibroids   Anxiety    Cataract    very small   Cholelithiasis    Closed fracture of base of fifth metacarpal bone of left hand 06/01/2018   DJD (degenerative joint disease)    Dry eye syndrome    Headache(784.0)    Hyperlipidemia 01/20/2011   Hypertension 09/29/2006   Substance abuse ([place])    Thrombocytopenia ([place]) 08/27/2011   hematology evaluation 08-2011, return prn   Urticaria    Vasomotor rhinitis     Past Surgical History:  Procedure Laterality Date   ABDOMINAL HYSTERECTOMY     no oophorectomy   APPENDECTOMY     CHOLECYSTECTOMY N/A 06/06/2016   Procedure: LAPAROSCOPIC CHOLECYSTECTOMY WITH INTRAOPERATIVE CHOLANGIOGRAM;  Surgeon: Johnathan Hausen, MD;  Location: [facility];  Service: General;  Laterality: N/A;   DILATION AND CURETTAGE OF UTERUS     EYE SURGERY Bilateral 05/06/2019   HERNIA REPAIR     mole removals     TONSILLECTOMY     UMBILICAL HERNIA REPAIR      Current Outpatient Medications  Medication Instructions   acetaminophen (TYLENOL) 500-1,000 mg, Oral, Daily PRN   Apple Cid Vn-Grn Tea-Bit Or-Cr (APPLE CIDER VINEGAR PLUS)  TABS See admin instructions   benzonatate (TESSALON) 200 mg, 3 times daily   blood glucose meter kit and supplies Dispense based on patient and insurance preference. Check blood sugars no more than twice daily   brimonidine (ALPHAGAN) 0.2 % ophthalmic solution 1 drop, 2 times daily   Calcium-Vitamin D (CALCIUM + D PO) 2 tablets, Oral, Daily with lunch, Calcium 697m <BR>Vitamin D 800 Units   carvedilol (COREG) 6.25 MG tablet TAKE 1 TABLET BY MOUTH 2 TIMES DAILY WITH A MEAL.   cetirizine (ZYRTEC) 10 mg, Oral, Daily   Coenzyme Q10-Vitamin E (QUNOL ULTRA COQ10) 100-150 MG-UNIT CAPS 1 tablet, Oral, Daily   cycloSPORINE (RESTASIS) 0.05 % ophthalmic emulsion 1 drop, Both Eyes, 2 times daily   dextromethorphan (DELSYM) 15 mg, Oral, As needed   diclofenac Sodium (VOLTAREN) 1 % GEL Topical, 4 times daily, If needed   diphenhydrAMINE (BENADRYL) 25 mg, Oral, Every 6 hours PRN, Reported on 08/06/2015   EPINEPHrine (EPIPEN 2-PAK) 0.3 mg, Intramuscular,  Once   ezetimibe (ZETIA) 10 mg, Oral, Daily   famotidine (PEPCID) 20 mg, Oral, 2 times daily   fluticasone (FLONASE) 50 MCG/ACT nasal spray 1-2 sprays, Each Nare, Daily PRN   Gluc-Chonn-MSM-Boswellia-Vit D (GLUCOSAMINE CHOND TRIPLE/VIT D PO) 2 tablets, Oral, Daily   hydrOXYzine (ATARAX/VISTARIL) 10 MG tablet    Lancets (ONETOUCH DELICA PLUS [id number] MISC CHECK BLOOD SUGAR NO MORE THAN TWICE  DAILY   latanoprost (XALATAN) 0.005 % ophthalmic solution 1 drop, Left Eye, Daily at bedtime, Dr. [name]    Magnesium 250 MG TABS 1 tablet with a meal   meclizine (ANTIVERT) 25 mg, Oral, Daily PRN   Methylcellulose, Laxative, (SOLUBLE FIBER PO) Oral, Daily PRN, Benefiber   mometasone (ELOCON) 0.1 % cream 1 application   montelukast (SINGULAIR) 10 MG tablet 1 tablet, Oral, Daily   Multiple Vitamin (MULTIVITAMIN WITH MINERALS) TABS 1 tablet, Daily with breakfast   naproxen sodium (ALEVE) 220 mg, Oral, Daily PRN   ONETOUCH ULTRA test strip CHECK BLOOD SUGAR NO MORE  THAN TWICE DAILY   Polyethyl Glycol-Propyl Glycol 0.4-0.3 % SOLN 1 drop, Both Eyes, Daily PRN, Systane   spironolactone (ALDACTONE) 25 mg, Oral, Daily   sulfacetamide (BLEPH-10) 10 % ophthalmic solution SMARTSIG:2 Drop(s) Left Eye Every 6 Hours   vitamin C (ASCORBIC ACID) 500 mg, Oral, Daily       Objective:   Physical Exam BP 122/66   Pulse 79   Temp 98.3 F (36.8 C) (Oral)   Resp 18   Ht 5' 1"  (1.549 m)   Wt 149 lb 6 oz (67.8 kg)   SpO2 95%   BMI 28.22 kg/m  General:   Well developed, NAD, BMI noted. HEENT:  Normocephalic . Face symmetric, atraumatic. Throat: Symmetric, no red. Right ear: Unable to see due to wax Left ear: TM normal Lungs:  CTA B Normal respiratory effort, no intercostal retractions, no accessory muscle use. Heart: RRR,  no murmur.  Lower extremities: no pretibial edema bilaterally  Skin: Not pale. Not jaundice Neurologic:  alert & oriented X3.  Has some difficulty finding words and remember events. Speech normal, gait appropriate for age and unassisted Psych--  Cognition and judgment appear intact.  Cooperative with normal attention span and concentration.  Behavior appropriate. No anxious or depressed appearing.      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 Polypharmacy: She takes  a number of medicines and supplements. We went over the list with the patient and her son. See AVS, recommend to stop naproxen, use Tylenol and Voltaren gel if needed. She takes at least 3 antihistaminics, needs to pick 1 and d/c others. She also takes a number of supplements, to begin with I recommend to Consider stopping glucosamine and apple cider vinegar glucosamine RTC: Due for CPX, encouraged to schedule

## 2022-01-03 NOTE — Patient Instructions (Addendum)
I think you are getting better from a cold.  Drink plenty of fluids  Over-the-counter Robitussin-DM as needed for cough  Flonase nasal spray: 2 sprays on each side of the nose daily.  Please come back for a physical exam at your convenience.   Review your medication list  - Naproxen or Aleve: I would avoid medication.  For pain is okay to take Tylenol and Voltaren gel on top posterior joint -- Hydroxyzine Zyrtec Benadryl are all antihistaminics, decide which one works for you and  stop  the others.

## 2022-01-05 NOTE — Assessment & Plan Note (Signed)
URI: Based on history, I think she is recovering from a URI, recommend conservative treatment with fluids, Robitussin-DM and Flonase. Polypharmacy: She takes a number of medicines and supplements. We went over the list with the patient and her son. See AVS, recommend to stop naproxen, use Tylenol and Voltaren gel if needed. She takes at least 3 antihistaminics, needs to pick 1 and d/c others. She also takes a number of supplements, to begin with I recommend to Consider stopping glucosamine and apple cider vinegar glucosamine RTC: Due for CPX, encouraged to schedule

## 2022-01-09 ENCOUNTER — Ambulatory Visit (INDEPENDENT_AMBULATORY_CARE_PROVIDER_SITE_OTHER): Payer: PPO | Admitting: Internal Medicine

## 2022-01-09 ENCOUNTER — Encounter: Payer: Self-pay | Admitting: Internal Medicine

## 2022-01-09 ENCOUNTER — Telehealth: Payer: Self-pay | Admitting: *Deleted

## 2022-01-09 VITALS — BP 124/70 | HR 86 | Temp 97.8°F | Resp 18 | Ht 61.0 in | Wt 150.4 lb

## 2022-01-09 DIAGNOSIS — E785 Hyperlipidemia, unspecified: Secondary | ICD-10-CM | POA: Diagnosis not present

## 2022-01-09 DIAGNOSIS — R739 Hyperglycemia, unspecified: Secondary | ICD-10-CM | POA: Diagnosis not present

## 2022-01-09 DIAGNOSIS — I10 Essential (primary) hypertension: Secondary | ICD-10-CM | POA: Diagnosis not present

## 2022-01-09 DIAGNOSIS — M25511 Pain in right shoulder: Secondary | ICD-10-CM

## 2022-01-09 DIAGNOSIS — Z0000 Encounter for general adult medical examination without abnormal findings: Secondary | ICD-10-CM

## 2022-01-09 DIAGNOSIS — Z1211 Encounter for screening for malignant neoplasm of colon: Secondary | ICD-10-CM

## 2022-01-09 NOTE — Progress Notes (Signed)
Subjective:    Patient ID: Monique [name], female    DOB: 22-May-1941, 81 y.o.   MRN: [id number]  DOS:  01/09/2022 Type of visit - description: CPX, here by herself.  In general feeling well. She did report a pain at the right trapezoid and deltoid area. No neck pain per se. Pain is on and off, without apparent cause, sometimes bothers her at night. Denies any upper extremity paresthesias or motor deficits  Review of Systems  Other than above, a 14 point review of systems is negative       Past Medical History:  Diagnosis Date   Allergy    Anemia    when had uterine fibroids   Anxiety    Cataract    very small   Cholelithiasis    Closed fracture of base of fifth metacarpal bone of left hand 06/01/2018   DJD (degenerative joint disease)    Dry eye syndrome    Headache(784.0)    Hyperlipidemia 01/20/2011   Hypertension 09/29/2006   Substance abuse ([place])    Thrombocytopenia ([place]) 08/27/2011   hematology evaluation 08-2011, return prn   Urticaria    Vasomotor rhinitis     Past Surgical History:  Procedure Laterality Date   ABDOMINAL HYSTERECTOMY     no oophorectomy   APPENDECTOMY     CHOLECYSTECTOMY N/A 06/06/2016   Procedure: LAPAROSCOPIC CHOLECYSTECTOMY WITH INTRAOPERATIVE CHOLANGIOGRAM;  Surgeon: Johnathan Hausen, MD;  Location: [facility];  Service: General;  Laterality: N/A;   DILATION AND CURETTAGE OF UTERUS     EYE SURGERY Bilateral 05/06/2019   HERNIA REPAIR     mole removals     TONSILLECTOMY     UMBILICAL HERNIA REPAIR     Social History   Socioeconomic History   Marital status: Widowed    Spouse name: Not on file   Number of children: 2   Years of education: Not on file   Highest education level: Not on file  Occupational History   Occupation: retired Theme park manager     Comment: was a Theme park manager  Tobacco Use   Smoking status: Never   Smokeless tobacco: Never  Vaping Use   Vaping Use: Never used  Substance and Sexual Activity   Alcohol use: No   Drug use: No    Sexual activity: Not Currently    Birth control/protection: Surgical  Other Topics Concern   Not on file  Social History Narrative   Lost her mother at 19 (april 2021)   husband had prostate ca , passed away 09-26-2013     Lives by herself   2 sons:  WS >>  Precious [place] , [place]   Drives as of [phone]   Social Determinants of Health   Financial Resource Strain: Medium Risk (05/09/2021)   Overall Financial Resource Strain (CARDIA)    Difficulty of Paying Living Expenses: Somewhat hard  Food Insecurity: No Food Insecurity (12/27/2020)   Hunger Vital Sign    Worried About Running Out of Food in the Last Year: Never true    [place] in the Last Year: Never true  Transportation Needs: No Transportation Needs (12/27/2020)   PRAPARE - Hydrologist (Medical): No    Lack of Transportation (Non-Medical): No  Physical Activity: Insufficiently Active (04/09/2021)   Exercise Vital Sign    Days of Exercise per Week: 2 days    Minutes of Exercise per Session: 20 min  Stress: No Stress Concern Present (12/27/2020)  [employer] - Occupational Stress Questionnaire    Feeling of Stress : Not at all  Social Connections: Moderately Integrated (12/27/2020)   Social Connection and Isolation Panel [NHANES]    Frequency of Communication with Friends and Family: More than three times a week    Frequency of Social Gatherings with Friends and Family: More than three times a week    Attends Religious Services: More than 4 times per year    Active Member of [employer] or Organizations: Yes    Attends Archivist Meetings: More than 4 times per year    Marital Status: Widowed  Intimate Partner Violence: Not At Risk (12/27/2020)   Humiliation, Afraid, Rape, and Kick questionnaire    Fear of Current or Ex-Partner: No    Emotionally Abused: No    Physically Abused: No    Sexually Abused: No    Current Outpatient Medications  Medication  Instructions   acetaminophen (TYLENOL) 500-1,000 mg, Oral, Daily PRN   blood glucose meter kit and supplies Dispense based on patient and insurance preference. Check blood sugars no more than twice daily   brimonidine (ALPHAGAN) 0.2 % ophthalmic solution 1 drop, 2 times daily   Calcium-Vitamin D (CALCIUM + D PO) 2 tablets, Oral, Daily with lunch, Calcium $RemoveBefore'600mg'[id number]$  <BR>Vitamin D 800 Units   carvedilol (COREG) 6.25 MG tablet TAKE 1 TABLET BY MOUTH 2 TIMES DAILY WITH A MEAL.   cetirizine (ZYRTEC) 10 mg, Oral, Daily   cycloSPORINE (RESTASIS) 0.05 % ophthalmic emulsion 1 drop, Both Eyes, 2 times daily   dextromethorphan (DELSYM) 15 mg, As needed   diclofenac Sodium (VOLTAREN) 1 % GEL Topical, 4 times daily, If needed   diphenhydrAMINE (BENADRYL) 25 mg, Oral, Every 6 hours PRN, Reported on 08/06/2015   EPINEPHrine (EPIPEN 2-PAK) 0.3 mg, Intramuscular,  Once   ezetimibe (ZETIA) 10 mg, Oral, Daily   famotidine (PEPCID) 20 mg, Oral, 2 times daily   fluticasone (FLONASE) 50 MCG/ACT nasal spray 1-2 sprays, Each Nare, Daily PRN   Gluc-Chonn-MSM-Boswellia-Vit D (GLUCOSAMINE CHOND TRIPLE/VIT D PO) 2 tablets, Oral, Daily   Lancets (ONETOUCH DELICA PLUS [id number]) MISC CHECK BLOOD SUGAR NO MORE THAN TWICE DAILY   latanoprost (XALATAN) 0.005 % ophthalmic solution 1 drop, Left Eye, Daily at bedtime, Dr. [name]    Magnesium 250 MG TABS 1 tablet with a meal   meclizine (ANTIVERT) 25 mg, Oral, Daily PRN   Methylcellulose, Laxative, (SOLUBLE FIBER PO) Oral, Daily PRN, Benefiber   mometasone (ELOCON) 0.1 % cream 1 application   montelukast (SINGULAIR) 10 MG tablet 1 tablet, Oral, Daily   Multiple Vitamin (MULTIVITAMIN WITH MINERALS) TABS 1 tablet, Daily with breakfast   naproxen sodium (ALEVE) 220 mg, Oral, Daily PRN   ONETOUCH ULTRA test strip CHECK BLOOD SUGAR NO MORE THAN TWICE DAILY   Polyethyl Glycol-Propyl Glycol 0.4-0.3 % SOLN 1 drop, Both Eyes, Daily PRN, Systane   spironolactone (ALDACTONE) 25 mg, Oral,  Daily   vitamin C (ASCORBIC ACID) 500 mg, Oral, Daily       Objective:   Physical Exam BP 124/70 (BP Location: Left Arm, Patient Position: Sitting, Cuff Size: Normal)   Pulse 86   Temp 97.8 F (36.6 C) (Oral)   Resp 18   Ht $R'5\' 1"'tU$  (1.549 m)   Wt 150 lb 6.4 oz (68.2 kg)   SpO2 97%   BMI 28.42 kg/m  General: Well developed, NAD, BMI noted Neck: No  thyromegaly  HEENT:  Normocephalic . Face symmetric, atraumatic Lungs:  CTA  B Normal respiratory effort, no intercostal retractions, no accessory muscle use. Heart: RRR,  no murmur.  Abdomen:  Not distended, soft, non-tender. No rebound or rigidity. MSK: Neck: No TTP and range of motion normal Shoulders range of motion: Normal without apparent discomfort. Lower extremities: no pretibial edema bilaterally  Skin: Exposed areas without rash. Not pale. Not jaundice Neurologic:  alert & oriented X3.  Speech normal, gait appropriate for age and unassisted Strength symmetric and appropriate for age. Motor and DTR symmetric Psych: Cognition and judgment appear intact.  Cooperative with normal attention span and concentration.  Behavior appropriate. No anxious or depressed appearing.     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 HTN: BP is very good, continue carvedilol, Aldactone.  Check CMP, CBC, TSH. Hyperlipidemia: On Zetia, history of urticaria with Pravachol, I  am hesitant to prescribe another statin. H/o thrombocytopenia: Checking labs Shoulder pain: As described above, on clinical grounds doubt radiculopathy.  Refer to sports medicine.  Polypharmacy: See last visit, today where deleting from her med list apple cider, Tessalon Perles, co-Q10, hydroxyzine. Patient reports that many other meds on her list are used prn. RTC 4-5 months

## 2022-01-09 NOTE — Telephone Encounter (Signed)
Opened in error

## 2022-01-09 NOTE — Patient Instructions (Signed)
Vaccines I recommend: Tdap (tetanus shot) You can get another COVID-vaccine at your convenience Flu shot this fall    GO TO THE LAB : Get the blood work     [address], [place] back for a checkup in 4 months    Fall Prevention in the Home, Adult Falls can cause injuries and affect people of all ages. There are many simple things that you can do to make your home safe and to help prevent falls. Ask for help when making these changes, if needed. What actions can I take to prevent falls? General instructions Use good lighting in all rooms. Replace any light bulbs that burn out, turn on lights if it is dark, and use night-lights. Place frequently used items in easy-to-reach places. Lower the shelves around your home if necessary. Set up furniture so that there are clear paths around it. Avoid moving your furniture around. Remove throw rugs and other tripping hazards from the floor. Avoid walking on wet floors. Fix any uneven floor surfaces. Add color or contrast paint or tape to grab bars and handrails in your home. Place contrasting color strips on the first and last steps of staircases. When you use a stepladder, make sure that it is completely opened and that the sides and supports are firmly locked. Have someone hold the ladder while you are using it. Do not climb a closed stepladder. Know where your pets are when moving through your home. What can I do in the bathroom?     Keep the floor dry. Immediately clean up any water that is on the floor. Remove soap buildup in the tub or shower regularly. Use nonskid mats or decals on the floor of the tub or shower. Attach bath mats securely with double-sided, nonslip rug tape. If you need to sit down while you are in the shower, use a plastic, nonslip stool. Install grab bars by the toilet and in the tub and shower. Do not use towel bars as grab bars. What can I do in the bedroom? Make sure that  a bedside light is easy to reach. Do not use oversized bedding that reaches the floor. Have a firm chair that has side arms to use for getting dressed. What can I do in the kitchen? Clean up any spills right away. If you need to reach for something above you, use a sturdy step stool that has a grab bar. Keep electrical cables out of the way. Do not use floor polish or wax that makes floors slippery. If you must use wax, make sure that it is non-skid floor wax. What can I do with my stairs? Do not leave any items on the stairs. Make sure that you have a light switch at the top and the bottom of the stairs. Have them installed if you do not have them. Make sure that there are handrails on both sides of the stairs. Fix handrails that are broken or loose. Make sure that handrails are as long as the staircases. Install non-slip stair treads on all stairs in your home. Avoid having throw rugs at the top or bottom of stairs, or secure the rugs with carpet tape to prevent them from moving. Choose a carpet design that does not hide the edge of steps on the stairs. Check any carpeting to make sure that it is firmly attached to the stairs. Fix any carpet that is loose or worn. What can I do on the outside of  my home? Use bright outdoor lighting. Regularly repair the edges of walkways and driveways and fix any cracks. Remove high doorway thresholds. Trim any shrubbery on the main path into your home. Regularly check that handrails are securely fastened and in good repair. Both sides of all steps should have handrails. Install guardrails along the edges of any raised decks or porches. Clear walkways of debris and clutter, including tools and rocks. Have leaves, snow, and ice cleared regularly. Use sand or salt on walkways during winter months. In the garage, clean up any spills right away, including grease or oil spills. What other actions can I take? Wear closed-toe shoes that fit well and support  your feet. Wear shoes that have rubber soles or low heels. Use mobility aids as needed, such as canes, walkers, scooters, and crutches. Review your medicines with your health care provider. Some medicines can cause dizziness or changes in blood pressure, which increase your risk of falling. Talk with your health care provider about other ways that you can decrease your risk of falls. This may include working with a physical therapist or trainer to improve your strength, balance, and endurance. Where to find more information Centers for Disease Control and Prevention, [employer]: http://www.wolf.info/ National Institute on Aging: http://kim-miller.com/ Contact a health care provider if: You are afraid of falling at home. You feel weak, drowsy, or dizzy at home. You fall at home. Summary There are many simple things that you can do to make your home safe and to help prevent falls. Ways to make your home safe include removing tripping hazards and installing grab bars in the bathroom. Ask for help when making these changes in your home. This information is not intended to replace advice given to you by your health care provider. Make sure you discuss any questions you have with your health care provider. Document Revised: 03/18/2021 Document Reviewed: 01/18/2020 Elsevier Patient Education  [place].

## 2022-01-10 ENCOUNTER — Encounter: Payer: Self-pay | Admitting: Internal Medicine

## 2022-01-10 LAB — CBC WITH DIFFERENTIAL/PLATELET
Basophils Absolute: 0.1 10*3/uL (ref 0.0–0.1)
Basophils Relative: 1.3 % (ref 0.0–3.0)
Eosinophils Absolute: 0.3 10*3/uL (ref 0.0–0.7)
Eosinophils Relative: 3.4 % (ref 0.0–5.0)
HCT: 40.5 % (ref 36.0–46.0)
Hemoglobin: 12.9 g/dL (ref 12.0–15.0)
Lymphocytes Relative: 26.2 % (ref 12.0–46.0)
Lymphs Abs: 2 10*3/uL (ref 0.7–4.0)
MCHC: 31.8 g/dL (ref 30.0–36.0)
MCV: 71.2 fl — ABNORMAL LOW (ref 78.0–100.0)
Monocytes Absolute: 0.6 10*3/uL (ref 0.1–1.0)
Monocytes Relative: 8.1 % (ref 3.0–12.0)
Neutro Abs: 4.7 10*3/uL (ref 1.4–7.7)
Neutrophils Relative %: 61 % (ref 43.0–77.0)
Platelets: 205 10*3/uL (ref 150.0–400.0)
RBC: 5.68 Mil/uL — ABNORMAL HIGH (ref 3.87–5.11)
RDW: 14.2 % (ref 11.5–15.5)
WBC: 7.6 10*3/uL (ref 4.0–10.5)

## 2022-01-10 LAB — LIPID PANEL
Cholesterol: 145 mg/dL (ref 0–200)
HDL: 48.6 mg/dL (ref >39.00)
LDL Cholesterol: 65 mg/dL (ref 0–99)
NonHDL: 96.12
Total CHOL/HDL Ratio: 3
Triglycerides: 157 mg/dL — ABNORMAL HIGH (ref 0.0–149.0)
VLDL: 31.4 mg/dL (ref 0.0–40.0)

## 2022-01-10 LAB — HEMOGLOBIN A1C: Hgb A1c MFr Bld: 6.1 % (ref 4.6–6.5)

## 2022-01-10 LAB — COMPREHENSIVE METABOLIC PANEL
ALT: 18 U/L (ref 0–35)
AST: 20 U/L (ref 0–37)
Albumin: 4.3 g/dL (ref 3.5–5.2)
Alkaline Phosphatase: 84 U/L (ref 39–117)
BUN: 17 mg/dL (ref 6–23)
CO2: 29 mEq/L (ref 19–32)
Calcium: 10.4 mg/dL (ref 8.4–10.5)
Chloride: 103 mEq/L (ref 96–112)
Creatinine, Ser: 1.04 mg/dL (ref 0.40–1.20)
GFR: 50.41 mL/min — ABNORMAL LOW (ref >60.00)
Glucose, Bld: 78 mg/dL (ref 70–99)
Potassium: 5 mEq/L (ref 3.5–5.1)
Sodium: 139 mEq/L (ref 135–145)
Total Bilirubin: 0.3 mg/dL (ref 0.2–1.2)
Total Protein: 6.6 g/dL (ref 6.0–8.3)

## 2022-01-10 LAB — TSH: TSH: 2.07 u[IU]/mL (ref 0.35–5.50)

## 2022-01-10 NOTE — Assessment & Plan Note (Signed)
-  Td 2014: Rec  booster, see AVS. -  pneumonia shot  2003, 2008, 2020, 2022 ;  prevnar 2015 -  zostavax 2007; s/p  shingrex  X 2  - covid vax 04-2021, ok to proceed w/ a booster, see AVS -  Flu shot  q fall -Female care :  +FH breast ca, [facility] 02/2021 per [facility]  History of hysterectomy for a benign condition, no abnormal Pap smears.  Has an appointment pending with gynecology per patient. -DEXA 10-2013 and 2019:  normal , on Ca and Vit D    -CCS: Cscope 10-2005 (-) and 02-2016  @ [place], next  5-10 years but optional per GI letter.  This was discussed with the patient, she likes to proceed.  Refer to GI. -Safety discussed: fall prevention  -Labs: CMP, FLP, CBC, A1c, TSH - ACP information provided

## 2022-01-10 NOTE — Assessment & Plan Note (Signed)
Here for CPX Pre-DM check A1c. HTN: BP is very good, continue carvedilol, Aldactone.  Check CMP, CBC, TSH. Hyperlipidemia: On Zetia, history of urticaria with Pravachol, I am hesitant to prescribe another statin. H/o thrombocytopenia: Checking labs Shoulder pain: As described above, on clinical grounds doubt radiculopathy.  Refer to sports medicine.  Polypharmacy: See last visit, today where deleting from her med list apple cider, Tessalon Perles, co-Q10, hydroxyzine. Patient reports that many other meds on her list are used prn. RTC 4-5 months

## 2022-01-14 ENCOUNTER — Telehealth: Payer: Self-pay | Admitting: Internal Medicine

## 2022-01-14 ENCOUNTER — Other Ambulatory Visit: Payer: Self-pay

## 2022-01-14 ENCOUNTER — Telehealth: Payer: Self-pay | Admitting: *Deleted

## 2022-01-14 MED ORDER — EZETIMIBE 10 MG PO TABS: 10.0000 mg | ORAL_TABLET | Freq: Every day | ORAL | 1 refills | Status: DC

## 2022-01-14 NOTE — Telephone Encounter (Signed)
Patient called and wanted lab results.  Patient notified of results.

## 2022-01-14 NOTE — Telephone Encounter (Signed)
PCP went over this w/ her and son at last visit.

## 2022-01-14 NOTE — Telephone Encounter (Signed)
Pt called wanting to know which medications Dr. Larose Kells wanted to keep her on and which ones he was trying to get her off of.
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
## 2022-01-17 ENCOUNTER — Ambulatory Visit: Payer: Self-pay

## 2022-01-17 ENCOUNTER — Ambulatory Visit: Payer: PPO | Admitting: Family Medicine

## 2022-01-17 ENCOUNTER — Ambulatory Visit (INDEPENDENT_AMBULATORY_CARE_PROVIDER_SITE_OTHER): Payer: PPO

## 2022-01-17 VITALS — BP 122/78 | HR 83 | Ht 61.0 in | Wt 152.2 lb

## 2022-01-17 DIAGNOSIS — M25511 Pain in right shoulder: Secondary | ICD-10-CM | POA: Diagnosis not present

## 2022-01-17 DIAGNOSIS — M5010 Cervical disc disorder with radiculopathy, unspecified cervical region: Secondary | ICD-10-CM

## 2022-01-17 DIAGNOSIS — G8929 Other chronic pain: Secondary | ICD-10-CM

## 2022-01-17 DIAGNOSIS — M47812 Spondylosis without myelopathy or radiculopathy, cervical region: Secondary | ICD-10-CM | POA: Diagnosis not present

## 2022-01-17 DIAGNOSIS — M5412 Radiculopathy, cervical region: Secondary | ICD-10-CM | POA: Diagnosis not present

## 2022-01-17 MED ORDER — GABAPENTIN 100 MG PO CAPS: 100.0000 mg | ORAL_CAPSULE | Freq: Three times a day (TID) | ORAL | 1 refills | Status: DC

## 2022-01-17 NOTE — Patient Instructions (Addendum)
Thank you for coming in today.   I've referred you to Physical Therapy.  Let us know if you don't hear from them in one week.   Please get an Xray today before you leave   I've sent a prescription for Gabapentin to your pharmacy.   Check back in 6 weeks

## 2022-01-20 NOTE — Progress Notes (Signed)
Right shoulder x-ray shows mild arthritis changes.

## 2022-01-20 NOTE — Progress Notes (Signed)
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

## 2022-01-24 ENCOUNTER — Other Ambulatory Visit: Payer: Self-pay | Admitting: Internal Medicine

## 2022-01-27 DIAGNOSIS — J300 Vasomotor rhinitis: Secondary | ICD-10-CM | POA: Diagnosis not present

## 2022-01-27 DIAGNOSIS — L509 Urticaria, unspecified: Secondary | ICD-10-CM | POA: Diagnosis not present

## 2022-01-28 ENCOUNTER — Encounter: Payer: Self-pay | Admitting: Physical Therapy

## 2022-01-28 ENCOUNTER — Other Ambulatory Visit: Payer: Self-pay

## 2022-01-28 ENCOUNTER — Ambulatory Visit: Payer: PPO | Attending: Family Medicine | Admitting: Physical Therapy

## 2022-01-28 DIAGNOSIS — M5412 Radiculopathy, cervical region: Secondary | ICD-10-CM | POA: Insufficient documentation

## 2022-01-28 DIAGNOSIS — M25511 Pain in right shoulder: Secondary | ICD-10-CM | POA: Diagnosis not present

## 2022-01-28 DIAGNOSIS — M5010 Cervical disc disorder with radiculopathy, unspecified cervical region: Secondary | ICD-10-CM | POA: Insufficient documentation

## 2022-01-28 DIAGNOSIS — G8929 Other chronic pain: Secondary | ICD-10-CM | POA: Diagnosis not present
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
## 2022-01-30 DIAGNOSIS — Z6830 Body mass index (BMI) 30.0-30.9, adult: Secondary | ICD-10-CM | POA: Diagnosis not present

## 2022-01-30 DIAGNOSIS — Z01419 Encounter for gynecological examination (general) (routine) without abnormal findings: Secondary | ICD-10-CM | POA: Diagnosis not present

## 2022-01-30 DIAGNOSIS — R350 Frequency of micturition: Secondary | ICD-10-CM | POA: Diagnosis not present

## 2022-01-31 ENCOUNTER — Encounter (HOSPITAL_COMMUNITY): Payer: Self-pay

## 2022-01-31 ENCOUNTER — Ambulatory Visit (HOSPITAL_COMMUNITY)
Admission: EM | Admit: 2022-01-31 | Discharge: 2022-01-31 | Disposition: A | Discharge status: Home / Self Care | Payer: PPO

## 2022-01-31 DIAGNOSIS — T50905A Adverse effect of unspecified drugs, medicaments and biological substances, initial encounter: Secondary | ICD-10-CM

## 2022-01-31 HISTORY — DX: Mononeuropathy, unspecified: G58.9

## 2022-01-31 HISTORY — DX: Unspecified osteoarthritis, unspecified site: M19.90

## 2022-01-31 LAB — POCT URINALYSIS DIPSTICK, ED / UC
Bilirubin Urine: NEGATIVE
Glucose, UA: NEGATIVE mg/dL
Ketones, ur: NEGATIVE mg/dL
Leukocytes,Ua: NEGATIVE
Nitrite: NEGATIVE
Protein, ur: NEGATIVE mg/dL
Specific Gravity, Urine: <=1.005 (ref 1.005–1.030)
Urobilinogen, UA: 1 mg/dL (ref 0.0–1.0)
pH: 6.5 (ref 5.0–8.0)

## 2022-01-31 NOTE — Discharge Instructions (Addendum)
Your urine does not indicate infection, we do not need to change the medication to another antibiotic, we just need to stop it all together. Please monitor your symptoms. Take '650mg'$  tylenol this evening. If you still have a fever in the morning, please head to the ER for fever of unknown origin workup.

## 2022-01-31 NOTE — ED Provider Notes (Signed)
[place]    CSN: [id number] Arrival date & time: 01/31/22  1933      History   Chief Complaint Chief Complaint  Patient presents with   Medication Reaction    HPI Monique [name] is a 81 y.o. female.   Pleasant 81 year old female presents today with concerns of reaction to Bactrim.  She saw her gynecologist yesterday for routine women's exam.  She had a UA done which showed trace leuks and trace blood.  She denied having any dysuria, but admitted apparently to having increased urinary frequency since her hysterectomy.  Her physician put her on twice daily Bactrim.  Patient took 1 last evening and both today, last dose was 5:30pm.  She states after each time she takes it, she develops adverse GI symptoms including cramping.  She denies any lip or tongue swelling, dysphagia, drooling, shortness of breath, cough, wheezing.  She denies any rash.  She did have a temperature of 100 degrees today.  She denies any peeling of the skin or blisters in her mucous membranes.  She denies any flank pain or hematuria. She denies any weakness, fatigue, headache, lethargy, covid exposures, URI sx. Pt admits in office that all of her GI symptoms have resolved in full.      Past Medical History:  Diagnosis Date   Allergy    Anemia    when had uterine fibroids   Anxiety    Arthritis    Cataract    very small   Cholelithiasis    Closed fracture of base of fifth metacarpal bone of left hand 06/01/2018   DJD (degenerative joint disease)    Dry eye syndrome    Headache(784.0)    Hyperlipidemia 01/20/2011   Hypertension 09/29/2006   Pinched nerve in neck    Substance abuse (HCC)    Thrombocytopenia ([place]) 08/27/2011   hematology evaluation 08-2011, return prn   Urticaria    Vasomotor rhinitis     Patient Active Problem List   Diagnosis Date Noted   Vasomotor rhinitis 04/11/2021   Urticaria 12/15/2019   MCI (mild cognitive impairment) 02/06/2017   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    GERD (gastroesophageal reflux disease) 06/05/2016   PCP NOTES >>>>>>>>>>>>>>>>>>>>>>>>> 02/04/2016   Severe allergic reaction 08/17/2014   Allergic rhinitis 12/01/2011   Thrombocytopenia ([place]) 08/27/2011   Hyperlipidemia 01/20/2011   Annual physical exam 09/18/2010   Hyperglycemia 09/17/2009   Backache 07/10/2008   Anxiety and depression 01/19/2008   HX OF GALLSTONE 10/20/2006   Essential hypertension 09/29/2006    Past Surgical History:  Procedure Laterality Date   ABDOMINAL HYSTERECTOMY     no oophorectomy   APPENDECTOMY     CHOLECYSTECTOMY N/A 06/06/2016   Procedure: LAPAROSCOPIC CHOLECYSTECTOMY WITH INTRAOPERATIVE CHOLANGIOGRAM;  Surgeon: Johnathan Hausen, MD;  Location: [facility];  Service: General;  Laterality: N/A;   DILATION AND CURETTAGE OF UTERUS     EYE SURGERY Bilateral 05/06/2019   HERNIA REPAIR     mole removals     TONSILLECTOMY     UMBILICAL HERNIA REPAIR      OB History   No obstetric history on file.      Home Medications    Prior to Admission medications   Medication Sig Start Date End Date Taking? Authorizing Provider  brimonidine (ALPHAGAN) 0.2 % ophthalmic solution 1 drop 2 (two) times daily. 10/28/21  Yes [provider]  Calcium-Vitamin D (CALCIUM + D PO) Take 2 tablets by mouth daily with lunch. Calcium 653m  Vitamin D 800  Units   Yes [provider]  carvedilol (COREG) 6.25 MG tablet TAKE 1 TABLET BY MOUTH 2 TIMES DAILY WITH A MEAL. 09/06/21  Yes Paz, Alda Berthold, MD  cycloSPORINE (RESTASIS) 0.05 % ophthalmic emulsion Place 1 drop into both eyes 2 (two) times daily.   Yes [provider]  diclofenac Sodium (VOLTAREN) 1 % GEL Apply topically 4 (four) times daily. If needed   Yes [provider]  ezetimibe (ZETIA) 10 MG tablet Take 1 tablet (10 mg total) by mouth daily. 01/14/22  Yes Paz, Alda Berthold, MD  famotidine (PEPCID) 20 MG tablet Take 20 mg by mouth 2 (two) times daily. 03/09/18  Yes Paz, Alda Berthold, MD  fluticasone  [facility]) 50 MCG/ACT nasal spray Place 1-2 sprays into both nostrils daily as needed for allergies or rhinitis.   Yes [provider]  gabapentin (NEURONTIN) 100 MG capsule Take 1 capsule (100 mg total) by mouth 3 (three) times daily. 01/17/22  Yes Gregor Hams, MD  Gluc-Chonn-MSM-Boswellia-Vit D (GLUCOSAMINE CHOND TRIPLE/VIT D PO) Take 2 tablets by mouth daily.   Yes [provider]  latanoprost (XALATAN) 0.005 % ophthalmic solution Place 1 drop into the left eye at bedtime. Dr. [name]   Yes [provider]  Magnesium 250 MG TABS 1 tablet with a meal   Yes [provider]  meclizine (ANTIVERT) 25 MG tablet Take 25 mg by mouth daily as needed for dizziness.   Yes [provider]  Multiple Vitamin (MULTIVITAMIN WITH MINERALS) TABS Take 1 tablet by mouth daily with breakfast.   Yes [provider]  spironolactone (ALDACTONE) 25 MG tablet TAKE 1 TABLET (25 MG TOTAL) BY MOUTH DAILY. 01/24/22  Yes Paz, Alda Berthold, MD  sulfamethoxazole-trimethoprim (BACTRIM DS) 800-160 MG tablet    Yes [provider]  vitamin C (ASCORBIC ACID) 500 MG tablet Take 500 mg by mouth daily.   Yes [provider]  acetaminophen (TYLENOL) 500 MG tablet Take 500-1,000 mg by mouth daily as needed.    [provider]  blood glucose meter kit and supplies Dispense based on patient and insurance preference. Check blood sugars no more than twice daily 05/25/19   Colon Branch, MD  cetirizine (ZYRTEC) 10 MG tablet Take 10 mg by mouth daily. 05/03/20   Colon Branch, MD  diphenhydrAMINE (BENADRYL) 12.5 MG/5ML liquid Take 25 mg by mouth every 6 (six) hours as needed for allergies. Reported on 08/06/2015    [provider]  EPINEPHrine (EPIPEN 2-PAK) 0.3 mg/0.3 mL IJ SOAJ injection Inject 0.3 mLs (0.3 mg total) into the muscle once. 11/27/15   Colon Branch, MD  Lancets (ONETOUCH DELICA PLUS ERDEYC14G) [place] BLOOD SUGAR NO MORE THAN TWICE DAILY 05/03/21    Mosie Lukes, MD  Methylcellulose, Laxative, (SOLUBLE FIBER PO) Take by mouth daily as needed. Benefiber    [provider]  mometasone (ELOCON) 0.1 % cream     [provider]  ONETOUCH ULTRA test strip CHECK BLOOD SUGAR NO MORE THAN TWICE DAILY 05/03/21   Mosie Lukes, MD  Polyethyl Glycol-Propyl Glycol 0.4-0.3 % SOLN Place 1 drop into both eyes daily as needed (dry eyes). Systane    [provider]    Family History Family History  Problem Relation Age of Onset   Coronary artery disease Father    Cancer Father        unsure of type   Other Father        Guillain- barre  syndrome   Diabetes Other        uncles    Hypertension Mother    Anxiety disorder Mother    Depression Mother    Prostate cancer Other        grandfather   Breast cancer Sister 63   Depression Maternal Uncle    Colon cancer Neg Hx    Colon polyps Neg Hx    Rectal cancer Neg Hx    Stomach cancer Neg Hx     Social History Social History   Tobacco Use   Smoking status: Never   Smokeless tobacco: Never  Vaping Use   Vaping Use: Never used  Substance Use Topics   Alcohol use: No   Drug use: No     Allergies   Bee venom, Doxycycline, Penicillins, Bee pollen, Azithromycin, Cerumenex [trolamine (triethanolamine)], Climara [estradiol], Nickel, and Sertraline   Review of Systems Review of Systems  Constitutional:  Positive for fever.     Physical Exam Triage Vital Signs ED Triage Vitals  Enc Vitals Group     BP 01/31/22 1958 118/72     Pulse Rate 01/31/22 1958 92     Resp 01/31/22 1958 16     Temp 01/31/22 1958 (!) 100.5 F (38.1 C)     Temp Source 01/31/22 1958 Oral     SpO2 01/31/22 1958 95 %     Weight 01/31/22 2002 149 lb (67.6 kg)     Height 01/31/22 2002 4' 11"  (1.499 m)     Head Circumference --      Peak Flow --      Pain Score 01/31/22 2001 6     Pain Loc --      Pain Edu? --      Excl. in [place]? --    No data found.  Updated Vital Signs BP  118/72 (BP Location: Right Arm)   Pulse 92   Temp 100.1 F (37.8 C) (Oral)   Resp 16   Ht 4' 11"  (1.499 m)   Wt 149 lb (67.6 kg)   SpO2 95%   BMI 30.09 kg/m   Visual Acuity Right Eye Distance:   Left Eye Distance:   Bilateral Distance:    Right Eye Near:   Left Eye Near:    Bilateral Near:     Physical Exam Vitals and nursing note reviewed.  Constitutional:      General: She is not in acute distress.    Appearance: Normal appearance. She is normal weight. She is not ill-appearing, toxic-appearing or diaphoretic.     Comments: Pt appears well, non-toxic  HENT:     Head: Normocephalic and atraumatic.     Right Ear: Tympanic membrane, ear canal and external ear normal.     Left Ear: Tympanic membrane, ear canal and external ear normal.     Nose: Nose normal. No congestion or rhinorrhea.     Mouth/Throat:     Mouth: Mucous membranes are moist.     Pharynx: Oropharynx is clear. No oropharyngeal exudate or posterior oropharyngeal erythema.  Eyes:     General: No scleral icterus.       Right eye: No discharge.        Left eye: No discharge.     Extraocular Movements: Extraocular movements intact.     Conjunctiva/sclera: Conjunctivae normal.     Pupils: Pupils are equal, round, and reactive to light.  Cardiovascular:     Rate and Rhythm: Normal rate and regular rhythm.  Pulses: Normal pulses.     Heart sounds: Normal heart sounds.  Pulmonary:     Effort: Pulmonary effort is normal. No respiratory distress.     Breath sounds: Normal breath sounds. No stridor. No wheezing, rhonchi or rales.  Chest:     Chest wall: No tenderness.  Abdominal:     General: Abdomen is flat. Bowel sounds are normal. There is no distension.     Palpations: Abdomen is soft. There is no mass.     Tenderness: There is no abdominal tenderness. There is no right CVA tenderness, left CVA tenderness, guarding or rebound.     Hernia: No hernia is present.  Musculoskeletal:     Cervical back: Normal  range of motion and neck supple. No rigidity or tenderness.     Right lower leg: No edema.     Left lower leg: No edema.  Lymphadenopathy:     Cervical: No cervical adenopathy.  Skin:    General: Skin is warm and dry.     Capillary Refill: Capillary refill takes less than 2 seconds.     Coloration: Skin is not jaundiced.     Findings: No rash.  Neurological:     General: No focal deficit present.     Mental Status: She is alert and oriented to person, place, and time.     Motor: No weakness.     Gait: Gait normal.  Psychiatric:        Mood and Affect: Mood normal.        Behavior: Behavior normal.      UC Treatments / Results  Labs (all labs ordered are listed, but only abnormal results are displayed) Labs Reviewed  POCT URINALYSIS DIPSTICK, ED / UC - Abnormal; Notable for the following components:      Result Value   Hgb urine dipstick TRACE (*)    All other components within normal limits    EKG   Radiology No results found.  Procedures Procedures (including critical care time)  Medications Ordered in UC Medications - No data to display  Initial Impression / Assessment and Plan / UC Course  I have reviewed the triage vital signs and the nursing notes.  Pertinent labs & imaging results that were available during my care of the patient were reviewed by me and considered in my medical decision making (see chart for details).     Medication reaction - overall I suspect her GI sx to be directly related to her recent bactrim use.  It sounds like she was possibly treated with an antibiotic for asymptomatic bacteriuria.  Her urine today is unremarkable apart from trace hgb.  Patient to stop the Bactrim.  She appears well.  She is not describing symptoms consistent with sepsis.  She has had no known COVID exposures.  Her lungs are otherwise clear and no GI tenderness on exam.  Uncertain if her fever is related to the Bactrim but unlikely.  We will recommend patient monitor  her temp at home, and should fever continue into tomorrow, or more importantly, should she develop systemic symptoms such as fatigue, lethargy, or recurrent GI symptoms despite cessation of Bactrim, had to the ER for fever of unknown determined origin work-up.   Final Clinical Impressions(s) / UC Diagnoses   Final diagnoses:  Medication reaction, initial encounter     Discharge Instructions      Your urine does not indicate infection, we do not need to change the medication to another antibiotic, we just need  to stop it all together. Please monitor your symptoms. Take 631m tylenol this evening. If you still have a fever in the morning, please head to the ER for fever of unknown origin workup.      ED Prescriptions   None    PDMP not reviewed this encounter.   CChaney Malling PUtah08/04/23 2055

## 2022-01-31 NOTE — ED Triage Notes (Signed)
Patient states she was put on a medication for UTI, sulfamethoxaz-OLE-TMP.   Patient started the medication yesterday night. Started having stomach pains that went up into the chest and back. Jeris Penta taking it this morning states she felt the pains again. Patient was also having chills and a fever.  Patient has history of allergies to antibiotics.   No SOB, throat closing, or itching.

## 2022-02-01 ENCOUNTER — Telehealth: Payer: Self-pay | Admitting: Urgent Care

## 2022-02-01 NOTE — Telephone Encounter (Signed)
Provider Elmer Ramp called pt to check on her. She states that her GI symptoms are improving. Had OJ so far this morning. Has not yet checked her temperature but feels like she does not have one and is overall improving. No new symptoms since last evening. Pt feeling well.

## 2022-02-03 ENCOUNTER — Telehealth: Payer: Self-pay | Admitting: Internal Medicine

## 2022-02-03 NOTE — Telephone Encounter (Signed)
Patient called to see if Dr. Larose Kells wants her to have bone density and mammogram done at the same time. Please place orders for both and call patient to advise.

## 2022-02-03 NOTE — Telephone Encounter (Signed)
Informed Pt that normal screening mammograms end at age 81 however she may continue if she prefers. Informed last dexa scan 02/2021 would not be eligible until 03/2023. Pt verbalized understanding.

## 2022-02-11 ENCOUNTER — Ambulatory Visit: Payer: PPO | Admitting: Physical Therapy

## 2022-02-11 ENCOUNTER — Encounter: Payer: Self-pay | Admitting: Physical Therapy

## 2022-02-11 DIAGNOSIS — G8929 Other chronic pain: Secondary | ICD-10-CM

## 2022-02-11 DIAGNOSIS — M5412 Radiculopathy, cervical region: Secondary | ICD-10-CM

## 2022-02-11 NOTE — Therapy (Signed)
OUTPATIENT PHYSICAL THERAPY TREATMENT NOTE   Patient Name: Monique [name] MRN: [id number] DOB:June 18, 1941, 81 y.o., female Today's Date: 02/11/2022  PCP: Colon Branch, MD   REFERRING PROVIDER: Gregor Hams, MD  END OF SESSION:   PT End of Session - 02/11/22 0936     Visit Number 2    Number of Visits 13    Date for PT Re-Evaluation 03/11/22    Authorization Type MCR: Kx mod at 10th visit, FOTO 6th and 10th visit.    Progress Note Due on Visit 10    PT Start Time 0935    PT Stop Time 1030    PT Time Calculation (min) 55 min             Past Medical History:  Diagnosis Date   Allergy    Anemia    when had uterine fibroids   Anxiety    Arthritis    Cataract    very small   Cholelithiasis    Closed fracture of base of fifth metacarpal bone of left hand 06/01/2018   DJD (degenerative joint disease)    Dry eye syndrome    Headache(784.0)    Hyperlipidemia 01/20/2011   Hypertension 09/29/2006   Pinched nerve in neck    Substance abuse (HCC)    Thrombocytopenia ([place]) 08/27/2011   hematology evaluation 08-2011, return prn   Urticaria    Vasomotor rhinitis    Past Surgical History:  Procedure Laterality Date   ABDOMINAL HYSTERECTOMY     no oophorectomy   APPENDECTOMY     CHOLECYSTECTOMY N/A 06/06/2016   Procedure: LAPAROSCOPIC CHOLECYSTECTOMY WITH INTRAOPERATIVE CHOLANGIOGRAM;  Surgeon: Johnathan Hausen, MD;  Location: [facility];  Service: General;  Laterality: N/A;   DILATION AND CURETTAGE OF UTERUS     EYE SURGERY Bilateral 05/06/2019   HERNIA REPAIR     mole removals     TONSILLECTOMY     UMBILICAL HERNIA REPAIR     Patient Active Problem List   Diagnosis Date Noted   Vasomotor rhinitis 04/11/2021   Urticaria 12/15/2019   MCI (mild cognitive impairment) 02/06/2017   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   GERD (gastroesophageal reflux disease) 06/05/2016   PCP NOTES >>>>>>>>>>>>>>>>>>>>>>>>> 02/04/2016   Severe allergic reaction 08/17/2014   Allergic rhinitis  12/01/2011   Thrombocytopenia ([place]) 08/27/2011   Hyperlipidemia 01/20/2011   Annual physical exam 09/18/2010   Hyperglycemia 09/17/2009   Backache 07/10/2008   Anxiety and depression 01/19/2008   HX OF GALLSTONE 10/20/2006   Essential hypertension 09/29/2006    REFERRING DIAG: Chronic right shoulder pain [M25.511, G89.29], 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 I did the exercises before that. I tense up with the shoulder squeeze. I feel mostly tense this morning. I also have some tender moles on the back of my neck that I need to see the dermatologist for. "  PAIN:  Are you having pain? Yes: NPRS scale: 3/10 Pain location: R sided neck/ upper trap Pain description: aching/ sore Aggravating factors: putting arm over the head Relieving factors: medication   OBJECTIVE: (objective measures completed at initial evaluation unless otherwise dated)   DIAGNOSTIC FINDINGS:  01/18/2022 X-ray Cervical spine IMPRESSION: Advanced multilevel cervical spondylosis most severe at C3-C4 where there  is near complete loss of disc space.   No evidence of fracture, malalignment or osseous lesion.   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   PATIENT SURVEYS:  FOTO Neck  61%  Predicted  63% (retaken 02/11/22 due to incorrect answers on initial)    COGNITION: Overall cognitive status: Within functional limits for tasks assessed     SENSATION: WFL   POSTURE: rounded shoulders and forward head   PALPATION: TTP along the R upper trap/ levator scapulae with multiple trigger points noted.  Tenderness with stiffness along the R paraspinals and sub-occipitals              CERVICAL ROM:    Active ROM A/PROM (deg) eval  Flexion 30  Extension 34  Right lateral flexion 40  Left lateral flexion 20  Right rotation 66 with concordant P!  Left rotation 68 with concordant P!   (Blank rows = not tested)   UPPER EXTREMITY ROM:   Active ROM Right eval Left eval  Shoulder flexion [facility]  Shoulder extension [facility]  Shoulder abduction [facility] WFL  Shoulder adduction [facility]  Shoulder internal rotation [facility] WFL  Shoulder external rotation [facility] WFL  Elbow flexion      Elbow extension      Wrist flexion      Wrist extension      Wrist ulnar deviation      Wrist radial deviation      Wrist pronation      Wrist supination       (Blank rows = not tested)   UPPER EXTREMITY MMT:   MMT Right eval Left eval  Shoulder flexion 4+/5 4+/5  Shoulder extension 4+/5 4+/5  Shoulder abduction 4+/5 4+/5  Shoulder adduction      Shoulder extension      Shoulder internal rotation 4+/5 4+/5  Shoulder external rotation 4+/5 4/5  Middle trapezius      Lower trapezius      Elbow flexion      Elbow extension      Wrist flexion      Wrist extension      Wrist ulnar deviation      Wrist radial deviation      Wrist pronation      Wrist supination      Grip strength       (Blank rows = not tested)   CERVICAL SPECIAL TESTS:  Cranial cervical flexion test: Positive, Upper limb tension test (ULTT): Positive, and Spurling's test: Positive     FUNCTIONAL TESTS:  Chin tuck measure   able to hold      TODAY'S TREATMENT:  [facility] Adult PT Treatment:                                                DATE: 02/11/2022 Therapeutic Exercise: Seated chin tuck 1 x 10 holding 5 seconds- mod cues and use of hand for active assist  Scapular retraction 1 x 10 holding 5 seconds: demonstration and tactile cues for proper form/ avoiding hiking shoulders. W Back x 10 for 5 sec , seated  Upper trap stretch 2 x  30 sec bil Levator scapulae stretch 2 x 30 sec bil Supine chin tuck over towel roll 5 sec x 10 Manual Therapy STW bilateral upper traps with passive stretch bilat  Modalities HMP to neck for 15 minutes supine  [facility] Adult PT Treatment:                                                DATE: 01/28/2022 Therapeutic Exercise: Seated chin tuck 1 x 5 holding 5 seconds Upper trap stretch 1 x 30 sec bil Levator scapulae stretch 1 x 30 sec bil Scapular retraction 1 x 5 holding 5 seconds: demonstration and tactile cues for proper form/ avoiding hiking shoulders.       PATIENT EDUCATION:  Education details: Evaluation findings, POC, goals, HEP with proper form/ rationale. Person educated: Patient Education method: Explanation and Verbal cues Education comprehension: verbalized understanding     HOME EXERCISE PROGRAM: Access Code: [email or web address] URL: [email or web address] Date: 01/28/2022 Prepared by: Starr Lake   Exercises - Standing Cervical Retraction  - 1 x daily - 7 x weekly - 2 sets - 10 reps - Supine Chin Tuck with Towel  - 1 x daily - 7 x weekly - 2 sets - 10 reps - 5 hold - Seated Upper Trapezius Stretch  - 1 x daily - 7 x weekly - 2 sets - 2 reps - 30 seconds hold - Gentle Levator Scapulae Stretch  - 1 x daily - 7 x weekly - 2 sets - 2 reps - 30 seconds hold - Seated Scapular Retraction  - 1 x daily - 7 x weekly - 2 sets - 10 reps - 5 seconds hold   ASSESSMENT:   CLINICAL IMPRESSION: Patient is a 81 y.o. F who was seen today for physical therapy treatment for neck and R shoulder pain, with referral of pain down the RUE into the hands.  Today her pain is described and stiffness in bilateral upper traps. Reviewed HEP and she required mod cues to avoid shoulder hike and for technique. Began manual STW and TPR to bilateral upper traps. Trial of HMP to decrease muscle tension. After tx she reported decreased muscle tension. She was educated on home options she can purchase  for neck and shoulder heat treatments at home.  She would benefit from continued  physical therapy to decrease cervical pain/ referred symptoms, maximize posterior shoulder and core strength to promote efficient posture by addressing the deficits listed.      OBJECTIVE IMPAIRMENTS decreased activity tolerance, increased fascial restrictions, increased muscle spasms, and pain.    ACTIVITY LIMITATIONS carrying and lifting   PARTICIPATION LIMITATIONS:  pain   PERSONAL FACTORS 1-2 comorbidities: Anxiety, DJD  are also affecting patient's functional outcome.    REHAB POTENTIAL: Excellent   CLINICAL DECISION MAKING: Stable/uncomplicated   EVALUATION COMPLEXITY: Low     GOALS: Goals reviewed with patient? Yes   SHORT TERM GOALS: Target date: 02/18/2022      Pt to be IND with initial HEP for therapeutic progression. Baseline:  Goal status: INITIAL       LONG TERM GOALS: Target date: 03/11/2022   Pt to verbalize/ demo efficient posture and lifting mechanics to reduce and prevent neck and RUE referred pain  Baseline:  Goal status: INITIAL   2.  Pt maintain functional cervical mobility with on report of pain or limitations with R/ L rotation to assist with safety with driving.  Baseline:  Goal status: INITIAL   3.  Pt to report reduction in RUE referred symptoms for >/= 1 week for improvement in  condition.  Baseline:  Goal status: INITIAL   4.   Pt to reduce cervical pain and referred RUE pain to </= 2/10 max for improvemend of condition Baseline:  Goal status: INITIAL   5.  Improve FOTO score to >/=75% to demo improvement in function Baseline: pt scored higher than predicted at74% Goal status: INITIAL   6.  Pt to be IND with all HEP and will be able to maintain and progress their current LOF IND Baseline:  Goal status: INITIAL     PLAN: PT FREQUENCY: 1-2x/week   PT DURATION: 6 weeks   PLANNED INTERVENTIONS: Therapeutic exercises, Therapeutic activity, Neuromuscular  re-education, Balance training, Gait training, Patient/Family education, Self Care, Joint mobilization, Aquatic Therapy, Dry Needling, Spinal mobilization, Cryotherapy, Moist heat, Taping, Traction, Ultrasound, Ionotophoresis '4mg'$ /ml Dexamethasone, Manual therapy, and Re-evaluation   PLAN FOR NEXT SESSION: Review/ update HEP PRN. Posture educationPosterior chain strengthening. STW along R upper trap/ levator scapulae/ trial manual traction.    Hessie Diener, PTA 02/11/22 11:57 AM Phone: [phone] Fax: [phone]

## 2022-02-12 ENCOUNTER — Encounter: Payer: Self-pay | Admitting: Podiatry

## 2022-02-12 ENCOUNTER — Ambulatory Visit: Payer: PPO | Admitting: Podiatry

## 2022-02-12 DIAGNOSIS — M79675 Pain in left toe(s): Secondary | ICD-10-CM | POA: Diagnosis not present

## 2022-02-12 DIAGNOSIS — I739 Peripheral vascular disease, unspecified: Secondary | ICD-10-CM

## 2022-02-12 DIAGNOSIS — B351 Tinea unguium: Secondary | ICD-10-CM

## 2022-02-12 DIAGNOSIS — M79674 Pain in right toe(s): Secondary | ICD-10-CM | POA: Diagnosis not present

## 2022-02-12 NOTE — Progress Notes (Signed)
Subjective: Monique [name] is a 81 y.o. female patient seen today in office with complaint of mildly painful thickened and elongated toenails; unable to trim. Patient denies history of Diabetes, Neuropathy, or known vascular disease but states that her husband used to trim her toenails and he is deceased now and can no longer do it so she needs help has difficulty reaching and seeing her toes to properly trim them. Patient has no other pedal complaints at this time.   Patient Active Problem List   Diagnosis Date Noted   Vasomotor rhinitis 04/11/2021   Urticaria 12/15/2019   MCI (mild cognitive impairment) 02/06/2017   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   GERD (gastroesophageal reflux disease) 06/05/2016   PCP NOTES >>>>>>>>>>>>>>>>>>>>>>>>> 02/04/2016   Severe allergic reaction 08/17/2014   Allergic rhinitis 12/01/2011   Thrombocytopenia ([place]) 08/27/2011   Hyperlipidemia 01/20/2011   Annual physical exam 09/18/2010   Hyperglycemia 09/17/2009   Backache 07/10/2008   Anxiety and depression 01/19/2008   HX OF GALLSTONE 10/20/2006   Essential hypertension 09/29/2006    Current Outpatient Medications on File Prior to Visit  Medication Sig Dispense Refill   acetaminophen (TYLENOL) 500 MG tablet Take 500-1,000 mg by mouth daily as needed.     blood glucose meter kit and supplies Dispense based on patient and insurance preference. Check blood sugars no more than twice daily 1 each 0   brimonidine (ALPHAGAN) 0.2 % ophthalmic solution 1 drop 2 (two) times daily.     Calcium-Vitamin D (CALCIUM + D PO) Take 2 tablets by mouth daily with lunch. Calcium 639m  Vitamin D 800 Units     carvedilol (COREG) 6.25 MG tablet TAKE 1 TABLET BY MOUTH 2 TIMES DAILY WITH A MEAL. 180 tablet 1   cetirizine (ZYRTEC) 10 MG tablet Take 10 mg by mouth daily.     cycloSPORINE (RESTASIS) 0.05 % ophthalmic emulsion Place 1 drop into both eyes 2 (two) times daily.     diclofenac Sodium (VOLTAREN) 1 % GEL Apply topically 4  (four) times daily. If needed     diphenhydrAMINE (BENADRYL) 12.5 MG/5ML liquid Take 25 mg by mouth every 6 (six) hours as needed for allergies. Reported on 08/06/2015     EPINEPHrine (EPIPEN 2-PAK) 0.3 mg/0.3 mL IJ SOAJ injection Inject 0.3 mLs (0.3 mg total) into the muscle once. 1 Device 1   ezetimibe (ZETIA) 10 MG tablet Take 1 tablet (10 mg total) by mouth daily. 90 tablet 1   famotidine (PEPCID) 20 MG tablet Take 20 mg by mouth 2 (two) times daily.     fluticasone (FLONASE) 50 MCG/ACT nasal spray Place 1-2 sprays into both nostrils daily as needed for allergies or rhinitis.     gabapentin (NEURONTIN) 100 MG capsule Take 1 capsule (100 mg total) by mouth 3 (three) times daily. 30 capsule 1   Gluc-Chonn-MSM-Boswellia-Vit D (GLUCOSAMINE CHOND TRIPLE/VIT D PO) Take 2 tablets by mouth daily.     Lancets (ONETOUCH DELICA PLUS [id number] MISC CHECK BLOOD SUGAR NO MORE THAN TWICE DAILY 100 each 12   latanoprost (XALATAN) 0.005 % ophthalmic solution Place 1 drop into the left eye at bedtime. Dr. [name]    Magnesium 250 MG TABS 1 tablet with a meal     meclizine (ANTIVERT) 25 MG tablet Take 25 mg by mouth daily as needed for dizziness.     Methylcellulose, Laxative, (SOLUBLE FIBER PO) Take by mouth daily as needed. Benefiber     mometasone (ELOCON) 0.1 % cream  Multiple Vitamin (MULTIVITAMIN WITH MINERALS) TABS Take 1 tablet by mouth daily with breakfast.     ONETOUCH ULTRA test strip CHECK BLOOD SUGAR NO MORE THAN TWICE DAILY 100 strip 12   Polyethyl Glycol-Propyl Glycol 0.4-0.3 % SOLN Place 1 drop into both eyes daily as needed (dry eyes). Systane     spironolactone (ALDACTONE) 25 MG tablet TAKE 1 TABLET (25 MG TOTAL) BY MOUTH DAILY. 90 tablet 1   sulfamethoxazole-trimethoprim (BACTRIM DS) 800-160 MG tablet      vitamin C (ASCORBIC ACID) 500 MG tablet Take 500 mg by mouth daily.     No current facility-administered medications on file prior to visit.    Allergies  Allergen Reactions    Bee Venom Anaphylaxis   Doxycycline Hives and Itching   Penicillins Itching, Nausea And Vomiting and Swelling    syncope   Bee Pollen     Other reaction(s): Unknown   Azithromycin Itching and Rash   Cerumenex [Trolamine (Triethanolamine)] Itching and Rash   Climara [Estradiol] Itching and Rash   Nickel Rash   Sertraline Other (See Comments)    Dry Mouth    Objective: Physical Exam  General: Well developed, nourished, no acute distress, awake, alert and oriented x 3  Vascular: Dorsalis pedis artery 1/4 bilateral, Posterior tibial artery 1/4 bilateral, skin temperature warm to warm proximal to distal bilateral lower extremities, mild varicosities, scant pedal hair present bilateral.  Trace edema to ankles bilateral.  Neurological: Gross sensation present via light touch bilateral.   Dermatological: Skin is warm, dry, and supple bilateral, Nails 1-10 are tender, long, thick, and discolored with mild subungal debris, no webspace macerations present bilateral, no open lesions present bilateral, no significant callus/corns/hyperkeratotic tissue present bilateral. No signs of infection bilateral.  Musculoskeletal: No symptomatic boney deformities noted bilateral. Muscular strength within normal limits without painon range of motion. No pain with calf compression bilateral.  Assessment and Plan:  Problem List Items Addressed This Visit   None Visit Diagnoses     Pain due to onychomycosis of toenails of both feet    -  Primary   PVD (peripheral vascular disease) ([place])           -Examined patient.  -Discussed treatment options for painful mycotic nails. -Mechanically debrided and reduced mycotic nails with sterile nail nipper and dremel nail file without incident. -Advised elevation of legs to assist with edema control and swelling that is occasional at ankles and to avoid foods that have excessive salt including ham -Patient to return in 3 months for follow up evaluation or sooner if  symptoms worsen.  Lorenda Peck, DPM

## 2022-02-13 ENCOUNTER — Ambulatory Visit: Payer: PPO | Admitting: Physical Therapy

## 2022-02-13 ENCOUNTER — Encounter: Payer: Self-pay | Admitting: Physical Therapy

## 2022-02-13 DIAGNOSIS — M5412 Radiculopathy, cervical region: Secondary | ICD-10-CM

## 2022-02-13 DIAGNOSIS — G8929 Other chronic pain: Secondary | ICD-10-CM

## 2022-02-13 NOTE — Therapy (Signed)
OUTPATIENT PHYSICAL THERAPY TREATMENT NOTE   Patient Name: Monique [name] MRN: [id number] DOB:1940/08/19, 81 y.o., female Today's Date: 02/13/2022  PCP: Colon Branch, MD   REFERRING PROVIDER: Gregor Hams, MD  END OF SESSION:   PT End of Session - 02/13/22 1106     Visit Number 3    Number of Visits 13    Date for PT Re-Evaluation 03/11/22    Authorization Type MCR: Kx mod at 10th visit, FOTO 6th and 10th visit.    Progress Note Due on Visit 10    PT Start Time 1104    PT Stop Time 1200    PT Time Calculation (min) 56 min             Past Medical History:  Diagnosis Date   Allergy    Anemia    when had uterine fibroids   Anxiety    Arthritis    Cataract    very small   Cholelithiasis    Closed fracture of base of fifth metacarpal bone of left hand 06/01/2018   DJD (degenerative joint disease)    Dry eye syndrome    Headache(784.0)    Hyperlipidemia 01/20/2011   Hypertension 09/29/2006   Pinched nerve in neck    Substance abuse (HCC)    Thrombocytopenia ([place]) 08/27/2011   hematology evaluation 08-2011, return prn   Urticaria    Vasomotor rhinitis    Past Surgical History:  Procedure Laterality Date   ABDOMINAL HYSTERECTOMY     no oophorectomy   APPENDECTOMY     CHOLECYSTECTOMY N/A 06/06/2016   Procedure: LAPAROSCOPIC CHOLECYSTECTOMY WITH INTRAOPERATIVE CHOLANGIOGRAM;  Surgeon: Johnathan Hausen, MD;  Location: [facility];  Service: General;  Laterality: N/A;   DILATION AND CURETTAGE OF UTERUS     EYE SURGERY Bilateral 05/06/2019   HERNIA REPAIR     mole removals     TONSILLECTOMY     UMBILICAL HERNIA REPAIR     Patient Active Problem List   Diagnosis Date Noted   Vasomotor rhinitis 04/11/2021   Urticaria 12/15/2019   MCI (mild cognitive impairment) 02/06/2017   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   GERD (gastroesophageal reflux disease) 06/05/2016   PCP NOTES >>>>>>>>>>>>>>>>>>>>>>>>> 02/04/2016   Severe allergic reaction 08/17/2014   Allergic rhinitis  12/01/2011   Thrombocytopenia ([place]) 08/27/2011   Hyperlipidemia 01/20/2011   Annual physical exam 09/18/2010   Hyperglycemia 09/17/2009   Backache 07/10/2008   Anxiety and depression 01/19/2008   HX OF GALLSTONE 10/20/2006   Essential hypertension 09/29/2006    REFERRING DIAG: Chronic right shoulder pain [M25.511, G89.29], 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 I felt better after the massage and the heat. The pain is still there but not as bothersome "  PAIN:  Are you having pain? Yes: NPRS scale: 3.5/10 Pain location: R upper trap, left wrist, right upper arm Pain description: aching/ sore Aggravating factors: putting arm over the head Relieving factors: medication   OBJECTIVE: (objective measures completed at initial evaluation unless otherwise dated)   DIAGNOSTIC FINDINGS:  01/18/2022 X-ray Cervical spine IMPRESSION: Advanced multilevel cervical spondylosis most severe at C3-C4 where there is near complete loss of disc space.   No evidence of fracture, malalignment or osseous lesion.   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   PATIENT SURVEYS:  FOTO Neck  61%  Predicted  63% (retaken 02/11/22 due to incorrect answers on initial)    COGNITION: Overall cognitive status: Within functional limits for tasks assessed     SENSATION: WFL   POSTURE: rounded shoulders and forward head   PALPATION: TTP along the R upper trap/ levator scapulae with multiple trigger points noted. Tenderness with stiffness along the R paraspinals and sub-occipitals              CERVICAL ROM:    Active ROM A/PROM (deg) eval   Flexion 30  Extension 34  Right lateral flexion 40  Left lateral flexion 20  Right rotation 66 with concordant P!  Left rotation 68 with concordant P!   (Blank rows = not tested)   UPPER EXTREMITY ROM:   Active ROM Right eval Left eval  Shoulder flexion [facility]  Shoulder extension [facility]  Shoulder abduction [facility] WFL  Shoulder adduction [facility]  Shoulder internal rotation [facility] WFL  Shoulder external rotation [facility] WFL  Elbow flexion      Elbow extension      Wrist flexion      Wrist extension      Wrist ulnar deviation      Wrist radial deviation      Wrist pronation      Wrist supination       (Blank rows = not tested)   UPPER EXTREMITY MMT:   MMT Right eval Left eval  Shoulder flexion 4+/5 4+/5  Shoulder extension 4+/5 4+/5  Shoulder abduction 4+/5 4+/5  Shoulder adduction      Shoulder extension      Shoulder internal rotation 4+/5 4+/5  Shoulder external rotation 4+/5 4/5  Middle trapezius      Lower trapezius      Elbow flexion      Elbow extension      Wrist flexion      Wrist extension      Wrist ulnar deviation      Wrist radial deviation      Wrist pronation      Wrist supination      Grip strength       (Blank rows = not tested)   CERVICAL SPECIAL TESTS:  Cranial cervical flexion test: Positive, Upper limb tension test (ULTT): Positive, and Spurling's test: Positive     FUNCTIONAL TESTS:  Chin tuck measure   able to hold      TODAY'S TREATMENT:  [facility] Adult PT Treatment:                                                DATE: 02/13/2022 Therapeutic Exercise: Seated chin tuck 1 x 10 holding 5 seconds- min cues  Scapular retraction 1 x 10 holding 5 seconds: min cues to avoid shoulder hike  W Back 2  x 10 for 5 sec , seated  Pec stretch in corner 15 sec x 4  Upper trap stretch 2 x 30 sec bil Levator scapulae stretch 2 x 30 sec bil Supine chin tuck over towel roll 5 sec x 10 Self Care: Posture education, demo of correct sitting posture ,  discussion of options for reading in bed, lumbar and knee support Manual Therapy STW bilateral upper traps with passive stretch bilat Modalities HMP  to neck for 15 minutes supine  [facility] Adult PT Treatment:                                                DATE: 02/11/2022 Therapeutic Exercise: Seated chin tuck 1 x 10 holding 5 seconds- mod cues and use of hand for active assist  Scapular retraction 1 x 10 holding 5 seconds: demonstration and tactile cues for proper form/ avoiding hiking shoulders. W Back x 10 for 5 sec , seated  Upper trap stretch 2 x 30 sec bil Levator scapulae stretch 2 x 30 sec bil Supine chin tuck over towel roll 5 sec x 10 Manual Therapy STW bilateral upper traps with passive stretch bilat Modalities HMP to neck for 15 minutes supine  [facility] Adult PT Treatment:                                                DATE: 01/28/2022 Therapeutic Exercise: Seated chin tuck 1 x 5 holding 5 seconds Upper trap stretch 1 x 30 sec bil Levator scapulae stretch 1 x 30 sec bil Scapular retraction 1 x 5 holding 5 seconds: demonstration and tactile cues for proper form/ avoiding hiking shoulders.       PATIENT EDUCATION:  Education details: Evaluation findings, POC, goals, HEP with proper form/ rationale. Person educated: Patient Education method: Explanation and Verbal cues Education comprehension: verbalized understanding     HOME EXERCISE PROGRAM: Access Code: [email or web address] URL: [email or web address] Date: 02/13/2022 Prepared by: Hessie Diener  Exercises - Standing Cervical Retraction  - 1 x daily - 7 x weekly - 2 sets - 10 reps - Supine Chin Tuck with Towel  - 1 x daily - 7 x weekly - 2 sets - 10 reps - 5 hold - Seated Upper Trapezius Stretch  - 1 x daily - 7 x weekly - 2 sets - 2 reps - 30 seconds hold - Gentle Levator Scapulae Stretch  - 1 x daily - 7 x weekly - 2 sets - 2 reps - 30 seconds hold - Seated Scapular Retraction  - 1 x daily - 7 x weekly - 2 sets - 10  reps - 5 seconds hold - Seated Shoulder W  - 1 x daily - 7 x weekly - 2 sets - 10 reps - 5 hold - Corner Pec Major Stretch  - 1 x daily - 7 x weekly - 1 sets - 4 reps - 15 hold   ASSESSMENT:   CLINICAL IMPRESSION: Patient is a 81 y.o. F who was seen today for physical therapy treatment for neck and R shoulder pain, with referral of pain down the RUE into the hands.  Today her pain is described as discomfort in right upper trap, right upper arm and left wrist. Reviewed HEP and she required less cues to avoid shoulder hike and for technique. Continues manual STW and TPR to bilateral upper traps. Continued HMP to decrease muscle tension. After tx she reported decreased muscle tension. She was educated on Seated Posture and options for reading positions.   She would benefit from continued  physical therapy to decrease cervical pain/ referred symptoms, maximize posterior shoulder and core strength to promote efficient posture by addressing the deficits listed.  OBJECTIVE IMPAIRMENTS decreased activity tolerance, increased fascial restrictions, increased muscle spasms, and pain.    ACTIVITY LIMITATIONS carrying and lifting   PARTICIPATION LIMITATIONS:  pain   PERSONAL FACTORS 1-2 comorbidities: Anxiety, DJD  are also affecting patient's functional outcome.    REHAB POTENTIAL: Excellent   CLINICAL DECISION MAKING: Stable/uncomplicated   EVALUATION COMPLEXITY: Low     GOALS: Goals reviewed with patient? Yes   SHORT TERM GOALS: Target date: 02/18/2022      Pt to be IND with initial HEP for therapeutic progression. Baseline:  Status: 02/13/22: min cues required Goal status: ONGOING       LONG TERM GOALS: Target date: 03/11/2022   Pt to verbalize/ demo efficient posture and lifting mechanics to reduce and prevent neck and RUE referred pain  Baseline:  Goal status: INITIAL   2.  Pt maintain functional cervical mobility with on report of pain or limitations with R/ L rotation to  assist with safety with driving.  Baseline:  Goal status: INITIAL   3.  Pt to report reduction in RUE referred symptoms for >/= 1 week for improvement in condition.  Baseline:  Goal status: INITIAL   4.   Pt to reduce cervical pain and referred RUE pain to </= 2/10 max for improvemend of condition Baseline:  Goal status: INITIAL   5.  Improve FOTO score to >/=75% to demo improvement in function Baseline: pt scored higher than predicted at74% Goal status: INITIAL   6.  Pt to be IND with all HEP and will be able to maintain and progress their current LOF IND Baseline:  Goal status: INITIAL     PLAN: PT FREQUENCY: 1-2x/week   PT DURATION: 6 weeks   PLANNED INTERVENTIONS: Therapeutic exercises, Therapeutic activity, Neuromuscular re-education, Balance training, Gait training, Patient/Family education, Self Care, Joint mobilization, Aquatic Therapy, Dry Needling, Spinal mobilization, Cryotherapy, Moist heat, Taping, Traction, Ultrasound, Ionotophoresis '4mg'$ /ml Dexamethasone, Manual therapy, and Re-evaluation   PLAN FOR NEXT SESSION: Review/ update HEP PRN. Posture educationPosterior chain strengthening. STW along R upper trap/ levator scapulae/ trial manual traction.    Hessie Diener, PTA 02/13/22 1:33 PM Phone: [phone] Fax: [phone]

## 2022-02-14 ENCOUNTER — Telehealth: Payer: Self-pay | Admitting: Pharmacist

## 2022-02-14 NOTE — Telephone Encounter (Signed)
Patient called about stopping MVI and glucosamine chondroitin supplement. She states her PCP recommended that she stop these medications and she wanted to know what she might expect if she stops.  Patient eats a varied diet with several vegetables or fruits per day.  Recommended she stop both these supplements. She will monitor for changes in joint pain over the next 2 to 3 months since stopping glucosamine-chondroitin.  Patient verified she is back at her home in [place].

## 2022-02-17 ENCOUNTER — Encounter: Payer: Self-pay | Admitting: Physical Therapy

## 2022-02-17 ENCOUNTER — Ambulatory Visit: Payer: PPO | Admitting: Physical Therapy

## 2022-02-17 DIAGNOSIS — M5412 Radiculopathy, cervical region: Secondary | ICD-10-CM

## 2022-02-17 DIAGNOSIS — G8929 Other chronic pain: Secondary | ICD-10-CM

## 2022-02-17 NOTE — Therapy (Signed)
OUTPATIENT PHYSICAL THERAPY TREATMENT NOTE   Patient Name: Monique [name] MRN: [id number] DOB:[birth year], 81 y.o., female Today's Date: 02/17/2022  PCP: Colon Branch, MD   REFERRING PROVIDER: Gregor Hams, MD  END OF SESSION:   PT End of Session - 02/17/22 0933     Visit Number 4    Number of Visits 13    Date for PT Re-Evaluation 03/11/22    Authorization Type MCR: Kx mod at 10th visit, FOTO 6th and 10th visit.    Progress Note Due on Visit 10    PT Start Time 0932    PT Stop Time 1030    PT Time Calculation (min) 58 min             Past Medical History:  Diagnosis Date   Allergy    Anemia    when had uterine fibroids   Anxiety    Arthritis    Cataract    very small   Cholelithiasis    Closed fracture of base of fifth metacarpal bone of left hand 06/01/2018   DJD (degenerative joint disease)    Dry eye syndrome    Headache(784.0)    Hyperlipidemia 01/20/2011   Hypertension 09/29/2006   Pinched nerve in neck    Substance abuse (HCC)    Thrombocytopenia ([place]) 08/27/2011   hematology evaluation 08-2011, return prn   Urticaria    Vasomotor rhinitis    Past Surgical History:  Procedure Laterality Date   ABDOMINAL HYSTERECTOMY     no oophorectomy   APPENDECTOMY     CHOLECYSTECTOMY N/A 06/06/2016   Procedure: LAPAROSCOPIC CHOLECYSTECTOMY WITH INTRAOPERATIVE CHOLANGIOGRAM;  Surgeon: Johnathan Hausen, MD;  Location: [facility];  Service: General;  Laterality: N/A;   DILATION AND CURETTAGE OF UTERUS     EYE SURGERY Bilateral 05/06/2019   HERNIA REPAIR     mole removals     TONSILLECTOMY     UMBILICAL HERNIA REPAIR     Patient Active Problem List   Diagnosis Date Noted   Vasomotor rhinitis 04/11/2021   Urticaria 12/15/2019   MCI (mild cognitive impairment) 02/06/2017   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   GERD (gastroesophageal reflux disease) 06/05/2016   PCP NOTES >>>>>>>>>>>>>>>>>>>>>>>>> 02/04/2016   Severe allergic reaction 08/17/2014   Allergic rhinitis  12/01/2011   Thrombocytopenia ([place]) 08/27/2011   Hyperlipidemia 01/20/2011   Annual physical exam 09/18/2010   Hyperglycemia 09/17/2009   Backache 07/10/2008   Anxiety and depression 01/19/2008   HX OF GALLSTONE 10/20/2006   Essential hypertension 09/29/2006    REFERRING DIAG: Chronic right shoulder pain [M25.511, G89.29], 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 I am trying to watch my posture. I stopped reading in the bed."   PAIN:  Are you having pain? Yes: NPRS scale: 0/10 Pain location: R upper trap, left wrist, right upper arm Pain description: aching/ sore Aggravating factors: putting arm over the head Relieving factors: medication   OBJECTIVE: (objective measures completed at initial evaluation unless otherwise dated)   DIAGNOSTIC FINDINGS:  01/18/2022 X-ray Cervical spine IMPRESSION: Advanced multilevel cervical spondylosis most severe at C3-C4 where there is near complete loss of disc space.   No evidence of fracture, malalignment or osseous lesion.   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   PATIENT SURVEYS:  FOTO Neck  61%  Predicted  63% (retaken 02/11/22 due to incorrect answers on initial)    COGNITION: Overall cognitive status: Within functional limits for tasks assessed     SENSATION: WFL   POSTURE: rounded shoulders and forward head   PALPATION: TTP along the R upper trap/ levator scapulae with multiple trigger points noted. Tenderness with stiffness along the R paraspinals and sub-occipitals              CERVICAL ROM:    Active ROM A/PROM (deg) eval AROM 02/17/22  Flexion 30   Extension 34    Right lateral flexion 40 45  Left lateral flexion 20 45  Right rotation 66 with concordant P! 62  Left rotation 68 with concordant P! 55   (Blank rows = not tested)   UPPER EXTREMITY ROM:   Active ROM Right eval Left eval  Shoulder flexion [facility]  Shoulder extension [facility]  Shoulder abduction [facility] WFL  Shoulder adduction [facility]  Shoulder internal rotation [facility] WFL  Shoulder external rotation [facility] WFL  Elbow flexion      Elbow extension      Wrist flexion      Wrist extension      Wrist ulnar deviation      Wrist radial deviation      Wrist pronation      Wrist supination       (Blank rows = not tested)   UPPER EXTREMITY MMT:   MMT Right eval Left eval  Shoulder flexion 4+/5 4+/5  Shoulder extension 4+/5 4+/5  Shoulder abduction 4+/5 4+/5  Shoulder adduction      Shoulder extension      Shoulder internal rotation 4+/5 4+/5  Shoulder external rotation 4+/5 4/5  Middle trapezius      Lower trapezius      Elbow flexion      Elbow extension      Wrist flexion      Wrist extension      Wrist ulnar deviation      Wrist radial deviation      Wrist pronation      Wrist supination      Grip strength       (Blank rows = not tested)   CERVICAL SPECIAL TESTS:  Cranial cervical flexion test: Positive, Upper limb tension test (ULTT): Positive, and Spurling's test: Positive     FUNCTIONAL TESTS:  Chin tuck measure   able to hold      TODAY'S TREATMENT:  [facility] Adult PT Treatment:                                                DATE: 02/17/2022 Therapeutic Exercise: UBE level 1 2 min forward and 2 min back  Row green band  10 x 2  Shoulder ext Red band 10 X 2  W Back 2  x 10 for 5 sec , seated  Seated chin tuck 1 x 10 holding 5 seconds- min cues  Scapular retraction 1 x 10 holding 5 seconds: min cues to avoid shoulder hike  Pec stretch in corner 15 sec x 4  Upper trap stretch 2 x 30 sec bil Levator scapulae stretch 2 x 30 sec bil Supine chin tuck over towel roll 5  sec x 10 Therapeutic Activity:  Hip hinge with  wooden dowel in seated and standing Manual Therapy Passive stretch and TPR to bilateral upper traps, manual cervical distraction Modalities HMP to neck for 15 minutes supine   [facility] Adult PT Treatment:                                                DATE: 02/13/2022 Therapeutic Exercise: Seated chin tuck 1 x 10 holding 5 seconds- min cues  Scapular retraction 1 x 10 holding 5 seconds: min cues to avoid shoulder hike  W Back 2  x 10 for 5 sec , seated  Pec stretch in corner 15 sec x 4  Upper trap stretch 2 x 30 sec bil Levator scapulae stretch 2 x 30 sec bil Supine chin tuck over towel roll 5 sec x 10 Self Care: Posture education, demo of correct sitting posture , discussion of options for reading in bed, lumbar and knee support Manual Therapy STW bilateral upper traps with passive stretch bilat Modalities HMP to neck for 15 minutes supine   [facility] Adult PT Treatment:                                                DATE: 02/11/2022 Therapeutic Exercise: Seated chin tuck 1 x 10 holding 5 seconds- mod cues and use of hand for active assist  Scapular retraction 1 x 10 holding 5 seconds: demonstration and tactile cues for proper form/ avoiding hiking shoulders. W Back x 10 for 5 sec , seated  Upper trap stretch 2 x 30 sec bil Levator scapulae stretch 2 x 30 sec bil Supine chin tuck over towel roll 5 sec x 10 Manual Therapy STW bilateral upper traps with passive stretch bilat Modalities HMP to neck for 15 minutes supine  [facility] Adult PT Treatment:                                                DATE: 01/28/2022 Therapeutic Exercise: Seated chin tuck 1 x 5 holding 5 seconds Upper trap stretch 1 x 30 sec bil Levator scapulae stretch 1 x 30 sec bil Scapular retraction 1 x 5 holding 5 seconds: demonstration and tactile cues for proper form/ avoiding hiking shoulders.       PATIENT EDUCATION:  Education details: Evaluation findings, POC, goals,  HEP with proper form/ rationale. Person educated: Patient Education method: Explanation and Verbal cues Education comprehension: verbalized understanding     HOME EXERCISE PROGRAM: Access Code: [email or web address] URL: [email or web address] Date: 02/13/2022 Prepared by: Hessie Diener  Exercises - Standing Cervical Retraction  - 1 x daily - 7 x weekly - 2 sets - 10 reps - Supine Chin Tuck with Towel  - 1 x daily - 7 x weekly - 2 sets - 10 reps - 5 hold - Seated Upper Trapezius Stretch  - 1 x daily - 7 x weekly - 2 sets - 2 reps - 30 seconds hold - Gentle Levator Scapulae Stretch  - 1 x daily - 7 x weekly - 2 sets - 2 reps - 30 seconds hold - Seated Scapular Retraction  - 1 x daily - 7  x weekly - 2 sets - 10 reps - 5 seconds hold - Seated Shoulder W  - 1 x daily - 7 x weekly - 2 sets - 10 reps - 5 hold - Corner Pec Major Stretch  - 1 x daily - 7 x weekly - 1 sets - 4 reps - 15 hold   ASSESSMENT:   CLINICAL IMPRESSION: Patient is a 81 y.o. F who was seen today for physical therapy treatment for neck and R shoulder pain, with referral of pain down the RUE into the hands.  Today she has no pain in neck or RUE. She reports radicular pain is less intense and less frequent overall. Her cervical side bend ROM has improved. She is much more mindful of posture and has stopped reading in bed. Worked on hip hinge in standing and seated, needed wooden dowel for feedback on spine alignment. Good carryover noted afterward. Overall making good progress toward LTGs. Progressed therex to scapular bands with good tolerance. Continued manual for TPR to reduce upper trap tension and decrease referred pain.    She would benefit from continued  physical therapy to decrease cervical pain/ referred symptoms, maximize posterior shoulder and core strength to promote efficient posture by addressing the deficits listed.      OBJECTIVE IMPAIRMENTS decreased activity tolerance, increased fascial restrictions,  increased muscle spasms, and pain.    ACTIVITY LIMITATIONS carrying and lifting   PARTICIPATION LIMITATIONS:  pain   PERSONAL FACTORS 1-2 comorbidities: Anxiety, DJD  are also affecting patient's functional outcome.    REHAB POTENTIAL: Excellent   CLINICAL DECISION MAKING: Stable/uncomplicated   EVALUATION COMPLEXITY: Low     GOALS: Goals reviewed with patient? Yes   SHORT TERM GOALS: Target date: 02/18/2022      Pt to be IND with initial HEP for therapeutic progression. Baseline:  Status: 02/13/22: min cues required Goal status: ONGOING       LONG TERM GOALS: Target date: 03/11/2022   Pt to verbalize/ demo efficient posture and lifting mechanics to reduce and prevent neck and RUE referred pain  Baseline:  Status: 02/17/22: reports being more mindful of posture/ began hip hinge in clinic Goal status: ONGOING   2.  Pt maintain functional cervical mobility with no report of pain or limitations with R/ L rotation to assist with safety with driving.  Baseline:  Status: 02/17/22: no pain with AROM rotation- see objective  Goal status: PARTIALLY MET   3.  Pt to report reduction in RUE referred symptoms for >/= 1 week for improvement in condition.  Baseline:  Status: no RU pain today 02/17/22 Goal status: ONGOING   4.   Pt to reduce cervical pain and referred RUE pain to </= 2/10 max for improvemend of condition Baseline:  Status"  02/17/22: no pain today in Neck or RUE Goal status: ONGOING   5.  Improve FOTO score to >/=63% to demo improvement in function Baseline: pt scored higher than predicted at74%; retaken 61% (predicted 63%)  Goal status: ONGOING   6.  Pt to be IND with all HEP and will be able to maintain and progress their current LOF IND Baseline:  Goal status: ONGOING     PLAN: PT FREQUENCY: 1-2x/week   PT DURATION: 6 weeks   PLANNED INTERVENTIONS: Therapeutic exercises, Therapeutic activity, Neuromuscular re-education, Balance training, Gait training,  Patient/Family education, Self Care, Joint mobilization, Aquatic Therapy, Dry Needling, Spinal mobilization, Cryotherapy, Moist heat, Taping, Traction, Ultrasound, Ionotophoresis 14m/ml Dexamethasone, Manual therapy, and Re-evaluation   PLAN FOR  NEXT SESSION: Review/ update HEP PRN. Posture educationPosterior chain strengthening. STW along R upper trap/ levator scapulae/ trial manual traction. Continue standing scap bands and update HEP     Hessie Diener, PTA 02/17/22 12:12 PM Phone: [phone] Fax: [phone]

## 2022-02-19 ENCOUNTER — Ambulatory Visit: Payer: PPO | Admitting: Physical Therapy

## 2022-02-19 ENCOUNTER — Other Ambulatory Visit (HOSPITAL_BASED_OUTPATIENT_CLINIC_OR_DEPARTMENT_OTHER): Payer: Self-pay | Admitting: Internal Medicine

## 2022-02-19 ENCOUNTER — Encounter: Payer: Self-pay | Admitting: Physical Therapy

## 2022-02-19 DIAGNOSIS — G8929 Other chronic pain: Secondary | ICD-10-CM

## 2022-02-19 DIAGNOSIS — M5412 Radiculopathy, cervical region: Secondary | ICD-10-CM

## 2022-02-19 DIAGNOSIS — Z1231 Encounter for screening mammogram for malignant neoplasm of breast: Secondary | ICD-10-CM

## 2022-02-19 NOTE — Therapy (Signed)
OUTPATIENT PHYSICAL THERAPY TREATMENT NOTE   Patient Name: Monique [name] MRN: [id number] DOB:04-Sep-1940, 81 y.o., female Today's Date: 02/19/2022  PCP: Colon Branch, MD   REFERRING PROVIDER: Gregor Hams, MD  END OF SESSION:   PT End of Session - 02/19/22 1122     Visit Number 5    Number of Visits 13    Date for PT Re-Evaluation 03/11/22    Authorization Type MCR: Kx mod at 10th visit, FOTO 6th and 10th visit.    PT Start Time 1100    PT Stop Time 1200    PT Time Calculation (min) 60 min              Past Medical History:  Diagnosis Date   Allergy    Anemia    when had uterine fibroids   Anxiety    Arthritis    Cataract    very small   Cholelithiasis    Closed fracture of base of fifth metacarpal bone of left hand 06/01/2018   DJD (degenerative joint disease)    Dry eye syndrome    Headache(784.0)    Hyperlipidemia 01/20/2011   Hypertension 09/29/2006   Pinched nerve in neck    Substance abuse ([place])    Thrombocytopenia ([place]) 08/27/2011   hematology evaluation 08-2011, return prn   Urticaria    Vasomotor rhinitis    Past Surgical History:  Procedure Laterality Date   ABDOMINAL HYSTERECTOMY     no oophorectomy   APPENDECTOMY     CHOLECYSTECTOMY N/A 06/06/2016   Procedure: LAPAROSCOPIC CHOLECYSTECTOMY WITH INTRAOPERATIVE CHOLANGIOGRAM;  Surgeon: Johnathan Hausen, MD;  Location: [facility];  Service: General;  Laterality: N/A;   DILATION AND CURETTAGE OF UTERUS     EYE SURGERY Bilateral 05/06/2019   HERNIA REPAIR     mole removals     TONSILLECTOMY     UMBILICAL HERNIA REPAIR     Patient Active Problem List   Diagnosis Date Noted   Vasomotor rhinitis 04/11/2021   Urticaria 12/15/2019   MCI (mild cognitive impairment) 02/06/2017   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  Rationale for Evaluation and Treatment Rehabilitation  PERTINENT HISTORY: Anxiety, DJD, HTB, Anemia   PRECAUTIONS: none  SUBJECTIVE: "I have no pain. No pain in arms still."   PAIN:  Are you having pain? Yes: NPRS scale: 0/10 Pain location: R upper trap, left wrist, right upper arm Pain description: aching/ sore Aggravating factors: putting arm over the head Relieving factors: medication   OBJECTIVE: (objective measures completed at initial evaluation unless otherwise dated)   DIAGNOSTIC FINDINGS:  01/18/2022 X-ray Cervical spine IMPRESSION: Advanced multilevel cervical spondylosis most severe at C3-C4 where there is near complete loss of disc space.   No evidence of fracture, malalignment or osseous lesion.   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   PATIENT SURVEYS:  FOTO Neck  61%  Predicted  63% (retaken 02/11/22 due to incorrect  answers on initial)    COGNITION: Overall cognitive status: Within functional limits for tasks assessed     SENSATION: WFL   POSTURE: rounded shoulders and forward head   PALPATION: TTP along the R upper trap/ levator scapulae with multiple trigger points noted. Tenderness with stiffness along the R paraspinals and sub-occipitals              CERVICAL ROM:    Active ROM A/PROM (deg) eval AROM 02/17/22  Flexion 30   Extension 34   Right lateral flexion 40 45  Left lateral flexion 20 45  Right rotation 66 with concordant P! 62  Left rotation 68  with concordant P! 55   (Blank rows = not tested)   UPPER EXTREMITY ROM:   Active ROM Right eval Left eval  Shoulder flexion [facility]  Shoulder extension [facility]  Shoulder abduction [facility] WFL  Shoulder adduction [facility]  Shoulder internal rotation [facility] WFL  Shoulder external rotation [facility] WFL  Elbow flexion      Elbow extension      Wrist flexion      Wrist extension      Wrist ulnar deviation      Wrist radial deviation      Wrist pronation      Wrist supination       (Blank rows = not tested)   UPPER EXTREMITY MMT:   MMT Right eval Left eval  Shoulder flexion 4+/5 4+/5  Shoulder extension 4+/5 4+/5  Shoulder abduction 4+/5 4+/5  Shoulder adduction      Shoulder extension      Shoulder internal rotation 4+/5 4+/5  Shoulder external rotation 4+/5 4/5  Middle trapezius      Lower trapezius      Elbow flexion      Elbow extension      Wrist flexion      Wrist extension      Wrist ulnar deviation      Wrist radial deviation      Wrist pronation      Wrist supination      Grip strength       (Blank rows = not tested)   CERVICAL SPECIAL TESTS:  Cranial cervical flexion test: Positive, Upper limb tension test (ULTT): Positive, and Spurling's test: Positive     FUNCTIONAL TESTS:  Chin tuck measure   able to hold      TODAY'S TREATMENT:  [facility] Adult PT Treatment:                                                DATE: 02/19/2022 Therapeutic Exercise: UBE level 1 2 min forward and 2 min back  Row green band  10 x 2  Shoulder ext Red band 10 X 2  Instruction in HEP for rows and ext in doorway Pec stretch in corner  W Back 2  x 10 for 5 sec , seated  Upper trap stretch 2 x 30 sec bil Levator scapulae stretch 2 x 30 sec bil Cervical SNAG with towel for rotation  Modalities HMP to neck for 15 minutes supine  [facility] Adult PT Treatment:  DATE: 02/17/2022 Therapeutic Exercise: UBE level 1 2 min forward and 2 min back  Row  green band  10 x 2  Shoulder ext Red band 10 X 2  W Back 2  x 10 for 5 sec , seated  Seated chin tuck 1 x 10 holding 5 seconds- min cues  Scapular retraction 1 x 10 holding 5 seconds: min cues to avoid shoulder hike  Pec stretch in corner 15 sec x 4  Upper trap stretch 2 x 30 sec bil Levator scapulae stretch 2 x 30 sec bil Supine chin tuck over towel roll 5 sec x 10 Therapeutic Activity:  Hip hinge with wooden dowel in seated and standing Manual Therapy Passive stretch and TPR to bilateral upper traps, manual cervical distraction Modalities HMP to neck for 15 minutes supine   [facility] Adult PT Treatment:                                                DATE: 02/13/2022 Therapeutic Exercise: Seated chin tuck 1 x 10 holding 5 seconds- min cues  Scapular retraction 1 x 10 holding 5 seconds: min cues to avoid shoulder hike  W Back 2  x 10 for 5 sec , seated  Pec stretch in corner 15 sec x 4  Upper trap stretch 2 x 30 sec bil Levator scapulae stretch 2 x 30 sec bil Supine chin tuck over towel roll 5 sec x 10 Self Care: Posture education, demo of correct sitting posture , discussion of options for reading in bed, lumbar and knee support Manual Therapy STW bilateral upper traps with passive stretch bilat Modalities HMP to neck for 15 minutes supine   [facility] Adult PT Treatment:                                                DATE: 02/11/2022 Therapeutic Exercise: Seated chin tuck 1 x 10 holding 5 seconds- mod cues and use of hand for active assist  Scapular retraction 1 x 10 holding 5 seconds: demonstration and tactile cues for proper form/ avoiding hiking shoulders. W Back x 10 for 5 sec , seated  Upper trap stretch 2 x 30 sec bil Levator scapulae stretch 2 x 30 sec bil Supine chin tuck over towel roll 5 sec x 10 Manual Therapy STW bilateral upper traps with passive stretch bilat Modalities HMP to neck for 15 minutes supine  [facility] Adult PT Treatment:                                                 DATE: 01/28/2022 Therapeutic Exercise: Seated chin tuck 1 x 5 holding 5 seconds Upper trap stretch 1 x 30 sec bil Levator scapulae stretch 1 x 30 sec bil Scapular retraction 1 x 5 holding 5 seconds: demonstration and tactile cues for proper form/ avoiding hiking shoulders.       PATIENT EDUCATION:  Education details: Evaluation findings, POC, goals, HEP with proper form/ rationale. Person educated: Patient Education method: Explanation and Verbal cues Education comprehension: verbalized understanding     HOME EXERCISE PROGRAM: Access Code: [email or web address]  URL: [email or web address] Date: 02/13/2022 Prepared by: Hessie Diener  Exercises - Standing Cervical Retraction  - 1 x daily - 7 x weekly - 2 sets - 10 reps - Supine Chin Tuck with Towel  - 1 x daily - 7 x weekly - 2 sets - 10 reps - 5 hold - Seated Upper Trapezius Stretch  - 1 x daily - 7 x weekly - 2 sets - 2 reps - 30 seconds hold - Gentle Levator Scapulae Stretch  - 1 x daily - 7 x weekly - 2 sets - 2 reps - 30 seconds hold - Seated Scapular Retraction  - 1 x daily - 7 x weekly - 2 sets - 10 reps - 5 seconds hold - Seated Shoulder W  - 1 x daily - 7 x weekly - 2 sets - 10 reps - 5 hold - Corner Pec Major Stretch  - 1 x daily - 7 x weekly - 1 sets - 4 reps - 15 hold   ASSESSMENT:   CLINICAL IMPRESSION: Patient is a 81 y.o. F who was seen today for physical therapy treatment for neck and R shoulder pain, with referral of pain down the RUE into the hands.  Again, today she has no pain in neck or RUE. She reports significant overall improvement in right upper trap and right arm pain. She is using body mechanics tips from last session at home with success.  Cervical AROM unchanged since last measure. Added cervical SNAG for rotation ROM and added to HEP as well as  scapular bands that she tolerated this well last session.    She would benefit from continued  physical therapy to decrease cervical pain/ referred  symptoms, maximize posterior shoulder and core strength to promote efficient posture by addressing the deficits listed.      OBJECTIVE IMPAIRMENTS decreased activity tolerance, increased fascial restrictions, increased muscle spasms, and pain.    ACTIVITY LIMITATIONS carrying and lifting   PARTICIPATION LIMITATIONS:  pain   PERSONAL FACTORS 1-2 comorbidities: Anxiety, DJD  are also affecting patient's functional outcome.    REHAB POTENTIAL: Excellent   CLINICAL DECISION MAKING: Stable/uncomplicated   EVALUATION COMPLEXITY: Low     GOALS: Goals reviewed with patient? Yes   SHORT TERM GOALS: Target date: 02/18/2022      Pt to be IND with initial HEP for therapeutic progression. Baseline:  Status: 02/13/22: min cues required Goal status: MET 02/19/22       LONG TERM GOALS: Target date: 03/11/2022   Pt to verbalize/ demo efficient posture and lifting mechanics to reduce and prevent neck and RUE referred pain  Baseline:  Status: 02/17/22: reports being more mindful of posture/ began hip hinge in clinic Goal status: ONGOING   2.  Pt maintain functional cervical mobility with no report of pain or limitations with R/ L rotation to assist with safety with driving.  Baseline:  Status: 02/17/22: no pain with AROM rotation- see objective  Goal status: PARTIALLY MET   3.  Pt to report reduction in RUE referred symptoms for >/= 1 week for improvement in condition.  Baseline:  Status: no RU pain today 02/17/22 Status: 02/19/22 no RU pain  Goal status: ONGOING   4.   Pt to reduce cervical pain and referred RUE pain to </= 2/10 max for improvemend of condition Baseline:  Status"  02/17/22: no pain today in Neck or RUE Status: 02/19/22: no pain today  Goal status: ONGOING   5.  Improve FOTO score to >/=63% to demo  improvement in function Baseline: pt scored higher than predicted at74%; retaken 61% (predicted 63%)  Goal status: ONGOING   6.  Pt to be IND with all HEP and will be able to  maintain and progress their current LOF IND Baseline:  Goal status: ONGOING     PLAN: PT FREQUENCY: 1-2x/week   PT DURATION: 6 weeks   PLANNED INTERVENTIONS: Therapeutic exercises, Therapeutic activity, Neuromuscular re-education, Balance training, Gait training, Patient/Family education, Self Care, Joint mobilization, Aquatic Therapy, Dry Needling, Spinal mobilization, Cryotherapy, Moist heat, Taping, Traction, Ultrasound, Ionotophoresis 62m/ml Dexamethasone, Manual therapy, and Re-evaluation   PLAN FOR NEXT SESSION: Review/ update HEP PRN. Posture educationPosterior chain strengthening. STW along R upper trap/ levator scapulae/ trial manual traction. Review bands and SNAG, work on cervical rotation ROM, FOTO at 7th (taken on 2nd)     JHessie Diener PTA 02/19/22 2:25 PM Phone: [phone]: [phone]

## 2022-02-20 DIAGNOSIS — H26499 Other secondary cataract, unspecified eye: Secondary | ICD-10-CM | POA: Diagnosis not present

## 2022-02-21 ENCOUNTER — Emergency Department (HOSPITAL_COMMUNITY)
Admission: EM | Admit: 2022-02-21 | Discharge: 2022-02-21 | Disposition: A | Discharge status: Home / Self Care | Payer: PPO | Attending: Emergency Medicine | Admitting: Emergency Medicine

## 2022-02-21 ENCOUNTER — Telehealth: Payer: Self-pay

## 2022-02-21 ENCOUNTER — Other Ambulatory Visit: Payer: Self-pay

## 2022-02-21 ENCOUNTER — Encounter (HOSPITAL_COMMUNITY): Payer: Self-pay | Admitting: Emergency Medicine

## 2022-02-21 DIAGNOSIS — S1086XA Insect bite of other specified part of neck, initial encounter: Secondary | ICD-10-CM | POA: Diagnosis not present

## 2022-02-21 DIAGNOSIS — W57XXXA Bitten or stung by nonvenomous insect and other nonvenomous arthropods, initial encounter: Secondary | ICD-10-CM | POA: Insufficient documentation

## 2022-02-21 NOTE — ED Triage Notes (Signed)
Pt reports getting stung by bee today approx 30 mins ago which she is allergic to. Pt reports taking benadryl. Pt denies hives, throat swelling at this time.

## 2022-02-21 NOTE — ED Provider Notes (Signed)
Monique [name]   CSN: [id number] Arrival date & time: 02/21/22  1637     History  Chief Complaint  Patient presents with   Allergic Reaction    Monique [name] is a 81 y.o. female.  Patient with history of hypertension presents today with concerns for allergic reaction. She states that approximately 30 minutes prior to arrival today she felt something sting her on the back of her neck. 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 She states that she put benadryl on her wound and took oral benadryl and came here for same. She has an Epi pen but did not use it. She denies any symptoms including oral swelling, shortness of breath, itching, or hives.  The history is provided by the patient. No language interpreter was used.  Allergic Reaction      Home Medications Prior to Admission medications   Medication Sig Start Date End Date Taking? Authorizing Provider  acetaminophen (TYLENOL) 500 MG tablet Take 500-1,000 mg by mouth daily as needed.    [provider]  blood glucose meter kit and supplies Dispense based on patient and insurance preference. Check blood sugars no more than twice daily 05/25/19   Kathlene November E, MD  brimonidine [facility]) 0.2 % ophthalmic solution 1 drop 2 (two) times daily. 10/28/21   [provider]  Calcium-Vitamin D (CALCIUM + D PO) Take 2 tablets by mouth daily with lunch. Calcium 646m  Vitamin D 800 Units    [provider]  carvedilol (COREG) 6.25 MG tablet TAKE 1 TABLET BY MOUTH 2 TIMES DAILY WITH A MEAL. 09/06/21   PColon Branch MD  cetirizine (ZYRTEC) 10 MG tablet Take 10 mg by mouth daily. 05/03/20   PColon Branch MD  cycloSPORINE (RESTASIS) 0.05 % ophthalmic emulsion Place 1 drop into both eyes 2 (two) times daily.    [provider]  diclofenac Sodium (VOLTAREN) 1 % GEL Apply  topically 4 (four) times daily. If needed    [provider]  diphenhydrAMINE (BENADRYL) 12.5 MG/5ML liquid Take 25 mg by mouth every 6 (six) hours as needed for allergies. Reported on 08/06/2015    [provider]  EPINEPHrine (EPIPEN 2-PAK) 0.3 mg/0.3 mL IJ SOAJ injection Inject 0.3 mLs (0.3 mg total) into the muscle once. 11/27/15   PColon Branch MD  ezetimibe (ZETIA) 10 MG tablet Take 1 tablet (10 mg total) by mouth daily. 01/14/22   PColon Branch MD  famotidine (PEPCID) 20 MG tablet Take 20 mg by mouth 2 (two) times daily. 03/09/18   PColon Branch MD  fluticasone ([facility] 50 MCG/ACT nasal spray Place 1-2 sprays into both nostrils daily as needed for allergies or rhinitis.    [provider]  gabapentin (NEURONTIN) 100 MG capsule Take 1 capsule (100 mg total) by mouth 3 (three) times daily. 01/17/22   CGregor Hams MD  Lancets (ONETOUCH DELICA PLUS [id number] ML[place]CHECK BLOOD SUGAR NO MORE THAN TWICE DAILY 05/03/21   BMosie Lukes MD  latanoprost (XALATAN) 0.005 % ophthalmic solution Place 1 drop into the left eye at bedtime. Dr. [name]   [provider]  Magnesium 250 MG TABS 1 tablet with a meal    [provider]  meclizine (ANTIVERT) 25 MG tablet Take 25 mg by mouth daily as needed for dizziness.  [provider]  Methylcellulose, Laxative, (SOLUBLE FIBER PO) Take by mouth daily as needed. Benefiber    [provider]  mometasone (ELOCON) 0.1 % cream     [provider]  ONETOUCH ULTRA test strip CHECK BLOOD SUGAR NO MORE THAN TWICE DAILY 05/03/21   Mosie Lukes, MD  Polyethyl Glycol-Propyl Glycol 0.4-0.3 % SOLN Place 1 drop into both eyes daily as needed (dry eyes). Systane    [provider]  spironolactone (ALDACTONE) 25 MG tablet TAKE 1 TABLET (25 MG TOTAL) BY MOUTH DAILY. 01/24/22   Colon Branch, MD  triamcinolone cream (KENALOG) 0.1 % APPLY THIN COAT TO AFFECTED AREA TWICE A DAY 02/03/22   [provider]  vitamin C (ASCORBIC ACID) 500 MG tablet Take 500 mg by mouth daily.    [provider]      Allergies    Bee venom, Doxycycline, Penicillins, Bee pollen, Azithromycin, Cerumenex [trolamine (triethanolamine)], Climara [estradiol], Nickel, and Sertraline    Review of Systems   Review of Systems  Skin:  Positive for wound.  All other systems reviewed and are negative.   Physical Exam Updated Vital Signs BP 130/74 (BP Location: Right Arm)   Pulse 77   Temp 98 F (36.7 C) (Oral)   Resp 20   SpO2 100%  Physical Exam Vitals and nursing [name] reviewed.  Constitutional:      General: She is not in acute distress.    Appearance: Normal appearance. She is normal weight. She is not ill-appearing, toxic-appearing or diaphoretic.  HENT:     Head: Normocephalic and atraumatic.     Mouth/Throat:     Comments: No mucosal swelling Eyes:     Extraocular Movements: Extraocular movements intact.     Pupils: Pupils are equal, round, and reactive to light.  Neck:     Comments: Punctate area of erythema noted to the posterior neck. No fluctuance or induration. No drainage Cardiovascular:     Rate and Rhythm: Normal rate and regular rhythm.     Heart sounds: Normal heart sounds.  Pulmonary:     Effort: Pulmonary effort is normal. No respiratory distress.     Breath sounds: Normal breath sounds.  Abdominal:     General: Abdomen is flat.     Palpations: Abdomen is soft.  Musculoskeletal:        General: Normal range of motion.     Cervical back: Normal range of motion and neck supple.  Skin:    General: Skin is warm and dry.     Findings: No rash.  Neurological:     General: No focal deficit present.     Mental Status: She is alert.  Psychiatric:        Mood and Affect: Mood normal.        Behavior: Behavior normal.     ED Results / Procedures / Treatments   Labs (all labs ordered are listed, but only abnormal results are displayed) Labs Reviewed - No data  to display  EKG EKG Interpretation  Date/Time:  Friday February 21 2022 16:44:16 EDT Ventricular Rate:  84 PR Interval:  148 QRS Duration: 87 QT Interval:  387 QTC Calculation: 458 R Axis:   34 Text Interpretation: Sinus rhythm Low voltage, precordial leads Confirmed by Garnette Gunner [phone]) on 02/21/2022 8:49:24 PM  Radiology No results found.  Procedures Procedures    Medications Ordered in ED Medications - No data to display  ED Course/ Medical Decision Making/ A&P  Medical Decision Making  Patient presents today with concern for allergic reaction. She is afebrile, non-toxic appearing, and in no acute distress with reassuring vital signs. She is also completely asymptomatic.   After 4.5 hours of monitoring in the ER today, patient continues to be completely asymptomatic without any rashes, itching, or mucosal swelling. Therefore will defer laboratory evaluation, imaging, or medications. She is stable for discharge. She has benadryl and an Epi pen at home, educated on symptoms that would prompt use of this as well as close return precautions given. Patient is understanding and amenable with plan, discharged in stable condition.   This is a shared visit with supervising physician Dr. Truett Mainland who has independently evaluated patient & provided guidance in evaluation/management/disposition, in agreement with care    Final Clinical Impression(s) / ED Diagnoses Final diagnoses:  Insect bite of other part of neck, initial encounter    Rx / DC Orders ED Discharge Orders     None     An After Visit Summary was printed and given to the patient.     Nestor Lewandowsky 02/21/22 2103    Cristie Hem, MD 02/21/22 [phone]

## 2022-02-21 NOTE — Telephone Encounter (Signed)
Pt called- she just got stung by bee, she is not having SHOB, swelling, she does have a bump on the back of her neck. She is allergic to bee stings. Recommend to be seen at ED, have someone drive her. Pt verbalized understanding.

## 2022-02-21 NOTE — Discharge Instructions (Addendum)
Follow-up with your PCP as needed for management of your chronic medical problems.   Return if development of any new or worsening symptoms

## 2022-02-21 NOTE — ED Provider Triage Note (Signed)
Emergency Medicine Provider Triage Evaluation Note  Monique [name] , a 81 y.o. female  was evaluated in triage.  Pt complains of bee sting. States that she was stung on the back of the neck when she went to get the mail earlier. She did not see what actually stung her. 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 She put benadryl on the wound and took oral benadryl as well. She has an Epi pen but did not use it. Denies any symptoms currently. No hives, swelling, or shortness of breath  Review of Systems  Positive:  Negative:   Physical Exam  BP 134/75 (BP Location: Left Arm)   Pulse 85   Temp 98 F (36.7 C) (Oral)   Resp 19   SpO2 100%  Gen:   Awake, no distress   Resp:  Normal effort  MSK:   Moves extremities without difficulty  Other:  Small red raised lesion present on the left side of the back of the patients neck. No rash or mucosal swelling  Medical Decision Making  Medically screening exam initiated at 5:20 PM.  Appropriate orders placed.  LOAN OGUIN was informed that the remainder of the evaluation will be completed by another provider, this initial triage assessment does not replace that evaluation, and the importance of remaining in the ED until their evaluation is complete.     Bud Face, PA-C 02/21/22 1723

## 2022-02-24 ENCOUNTER — Ambulatory Visit: Payer: PPO | Admitting: Physical Therapy

## 2022-02-24 ENCOUNTER — Encounter: Payer: Self-pay | Admitting: Physical Therapy

## 2022-02-24 DIAGNOSIS — M5412 Radiculopathy, cervical region: Secondary | ICD-10-CM | POA: Diagnosis not present

## 2022-02-24 DIAGNOSIS — G8929 Other chronic pain: Secondary | ICD-10-CM

## 2022-02-24 NOTE — Therapy (Signed)
OUTPATIENT PHYSICAL THERAPY TREATMENT NOTE / DISCHARGE    Patient Name: HOORIA TRANTER MRN: [id number] DOB:09/11/1940, 81 y.o., female Today's Date: 02/24/2022  PCP: [name], MD   REFERRING PROVIDER: Rodolph Bong, MD  END OF SESSION:   PT End of Session - 02/24/22 1020     Visit Number 6    Number of Visits 13    Date for PT Re-Evaluation 03/11/22    Authorization Type MCR: Kx mod at 10th visit, FOTO 6th and 10th visit.    Progress Note Due on Visit 10    PT Start Time 1018    PT Stop Time 1048    PT Time Calculation (min) 30 min               Past Medical History:  Diagnosis Date   Allergy    Anemia    when had uterine fibroids   Anxiety    Arthritis    Cataract    very small   Cholelithiasis    Closed fracture of base of fifth metacarpal bone of left hand 06/01/2018   DJD (degenerative joint disease)    Dry eye syndrome    Headache(784.0)    Hyperlipidemia 01/20/2011   Hypertension 09/29/2006   Pinched nerve in neck    Substance abuse (HCC)    Thrombocytopenia (HCC) 08/27/2011   hematology evaluation 08-2011, return prn   Urticaria    Vasomotor rhinitis    Past Surgical History:  Procedure Laterality Date   ABDOMINAL HYSTERECTOMY     no oophorectomy   APPENDECTOMY     CHOLECYSTECTOMY N/A 06/06/2016   Procedure: LAPAROSCOPIC CHOLECYSTECTOMY WITH INTRAOPERATIVE CHOLANGIOGRAM;  Surgeon: Luretha Murphy, MD;  Location: [facility];  Service: General;  Laterality: N/A;   DILATION AND CURETTAGE OF UTERUS     EYE SURGERY Bilateral 05/06/2019   HERNIA REPAIR     mole removals     TONSILLECTOMY     UMBILICAL HERNIA REPAIR     Patient Active Problem List   Diagnosis Date Noted   Vasomotor rhinitis 04/11/2021   Urticaria 12/15/2019   MCI (mild cognitive impairment) 02/06/2017   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   GERD (gastroesophageal reflux disease) 06/05/2016   PCP NOTES >>>>>>>>>>>>>>>>>>>>>>>>> 02/04/2016   Severe allergic reaction 08/17/2014    Allergic rhinitis 12/01/2011   Thrombocytopenia (HCC) 08/27/2011   Hyperlipidemia 01/20/2011   Annual physical exam 09/18/2010   Hyperglycemia 09/17/2009   Backache 07/10/2008   Anxiety and depression 01/19/2008   HX OF GALLSTONE 10/20/2006   Essential hypertension 09/29/2006    REFERRING DIAG: Chronic right shoulder pain [M25.511, G89.29], 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 Did have a little catch yesterday but I did my exercises and it helped it go away.   PAIN:  Are you having pain? Yes: NPRS scale: 0/10 Pain location: R upper trap, left wrist, right upper arm Pain description: aching/ sore Aggravating factors: putting arm over the head Relieving factors: medication   OBJECTIVE: (objective measures completed at initial evaluation unless otherwise dated)   DIAGNOSTIC FINDINGS:  01/18/2022 X-ray Cervical spine IMPRESSION: Advanced multilevel cervical spondylosis most severe at C3-C4 where there is near complete loss of disc space.   No evidence of fracture, malalignment or osseous lesion.   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   PATIENT SURVEYS:  FOTO Neck  61%  Predicted  63% (retaken 02/11/22 due to incorrect answers on initial) 02/24/2022 66%    COGNITION: Overall cognitive status: Within functional limits for tasks assessed     SENSATION: WFL   POSTURE: rounded shoulders and forward head   PALPATION: TTP along the R upper trap/ levator scapulae with multiple trigger points noted. Tenderness with stiffness along the R paraspinals and sub-occipitals              CERVICAL ROM:    Active ROM  A/PROM (deg) eval AROM 02/17/22  Flexion 30   Extension 34   Right lateral flexion 40 45  Left lateral flexion 20 45  Right rotation 66 with concordant P! 62  Left rotation 68 with concordant P! 55   (Blank rows = not tested)   UPPER EXTREMITY ROM:   Active ROM Right eval Left eval  Shoulder flexion [facility]  Shoulder extension [facility]  Shoulder abduction [facility] WFL  Shoulder adduction [facility]  Shoulder internal rotation [facility] WFL  Shoulder external rotation [facility] WFL  Elbow flexion      Elbow extension      Wrist flexion      Wrist extension      Wrist ulnar deviation      Wrist radial deviation      Wrist pronation      Wrist supination       (Blank rows = not tested)   UPPER EXTREMITY MMT:   MMT Right eval Left eval  Shoulder flexion 4+/5 4+/5  Shoulder extension 4+/5 4+/5  Shoulder abduction 4+/5 4+/5  Shoulder adduction      Shoulder extension      Shoulder internal rotation 4+/5 4+/5  Shoulder external rotation 4+/5 4/5  Middle trapezius      Lower trapezius      Elbow flexion      Elbow extension      Wrist flexion      Wrist extension      Wrist ulnar deviation      Wrist radial deviation      Wrist pronation      Wrist supination      Grip strength       (Blank rows = not tested)   CERVICAL SPECIAL TESTS:  Cranial cervical flexion test: Positive, Upper limb tension test (ULTT): Positive, and Spurling's test: Positive     FUNCTIONAL TESTS:  Chin tuck measure   able to hold      TODAY'S TREATMENT:  [facility] Adult PT Treatment:                                                DATE: 02/24/2022 Therapeutic Exercise: Extensively reviewed HEP and updated today  [facility] Adult PT Treatment:                                                DATE: 02/19/2022 Therapeutic Exercise: UBE level 1 2 min forward and 2 min back  Row green band  10 x 2  Shoulder ext Red band 10 X 2  Instruction in HEP for rows  and ext in doorway Pec stretch in corner  W Back 2  x 10 for 5  sec , seated  Upper trap stretch 2 x 30 sec bil Levator scapulae stretch 2 x 30 sec bil Cervical SNAG with towel for rotation  Modalities HMP to neck for 15 minutes supine  [facility] Adult PT Treatment:                                                DATE: 02/17/2022 Therapeutic Exercise: UBE level 1 2 min forward and 2 min back  Row green band  10 x 2  Shoulder ext Red band 10 X 2  W Back 2  x 10 for 5 sec , seated  Seated chin tuck 1 x 10 holding 5 seconds- min cues  Scapular retraction 1 x 10 holding 5 seconds: min cues to avoid shoulder hike  Pec stretch in corner 15 sec x 4  Upper trap stretch 2 x 30 sec bil Levator scapulae stretch 2 x 30 sec bil Supine chin tuck over towel roll 5 sec x 10 Therapeutic Activity:  Hip hinge with wooden dowel in seated and standing Manual Therapy Passive stretch and TPR to bilateral upper traps, manual cervical distraction Modalities HMP to neck for 15 minutes supine   [facility] Adult PT Treatment:                                                DATE: 02/13/2022 Therapeutic Exercise: Seated chin tuck 1 x 10 holding 5 seconds- min cues  Scapular retraction 1 x 10 holding 5 seconds: min cues to avoid shoulder hike  W Back 2  x 10 for 5 sec , seated  Pec stretch in corner 15 sec x 4  Upper trap stretch 2 x 30 sec bil Levator scapulae stretch 2 x 30 sec bil Supine chin tuck over towel roll 5 sec x 10 Self Care: Posture education, demo of correct sitting posture , discussion of options for reading in bed, lumbar and knee support Manual Therapy STW bilateral upper traps with passive stretch bilat Modalities HMP to neck for 15 minutes supine   [facility] Adult PT Treatment:                                                DATE: 02/11/2022 Therapeutic Exercise: Seated chin tuck 1 x 10 holding 5 seconds- mod cues and use of hand for active assist  Scapular retraction 1 x 10 holding 5 seconds: demonstration and tactile cues for proper form/ avoiding hiking  shoulders. W Back x 10 for 5 sec , seated  Upper trap stretch 2 x 30 sec bil Levator scapulae stretch 2 x 30 sec bil Supine chin tuck over towel roll 5 sec x 10 Manual Therapy STW bilateral upper traps with passive stretch bilat Modalities HMP to neck for 15 minutes supine  [facility] Adult PT Treatment:  DATE: 01/28/2022 Therapeutic Exercise: Seated chin tuck 1 x 5 holding 5 seconds Upper trap stretch 1 x 30 sec bil Levator scapulae stretch 1 x 30 sec bil Scapular retraction 1 x 5 holding 5 seconds: demonstration and tactile cues for proper form/ avoiding hiking shoulders.       PATIENT EDUCATION:  Education details: Evaluation findings, POC, goals, HEP with proper form/ rationale. Person educated: Patient Education method: Explanation and Verbal cues Education comprehension: verbalized understanding     HOME EXERCISE PROGRAM: Access Code: [email or web address] URL: [email or web address] Date: 02/13/2022 Prepared by: Jannette Spanner  Exercises - Standing Cervical Retraction  - 1 x daily - 7 x weekly - 2 sets - 10 reps - Supine Chin Tuck with Towel  - 1 x daily - 7 x weekly - 2 sets - 10 reps - 5 hold - Seated Upper Trapezius Stretch  - 1 x daily - 7 x weekly - 2 sets - 2 reps - 30 seconds hold - Gentle Levator Scapulae Stretch  - 1 x daily - 7 x weekly - 2 sets - 2 reps - 30 seconds hold - Seated Scapular Retraction  - 1 x daily - 7 x weekly - 2 sets - 10 reps - 5 seconds hold - Seated Shoulder W  - 1 x daily - 7 x weekly - 2 sets - 10 reps - 5 hold - Corner Pec Major Stretch  - 1 x daily - 7 x weekly - 1 sets - 4 reps - 15 hold   ASSESSMENT:   CLINICAL IMPRESSION: Mrs Tolento has made excellent progress with physical therapy, and additionally reports no pain today. Extensively reviewed her HEP and provided upgraded resistance. She has met all goals today and is able to maintain and progress her current LOF IND and will be discharged  from PT today.    OBJECTIVE IMPAIRMENTS decreased activity tolerance, increased fascial restrictions, increased muscle spasms, and pain.    ACTIVITY LIMITATIONS carrying and lifting   PARTICIPATION LIMITATIONS:  pain   PERSONAL FACTORS 1-2 comorbidities: Anxiety, DJD  are also affecting patient's functional outcome.    REHAB POTENTIAL: Excellent   CLINICAL DECISION MAKING: Stable/uncomplicated   EVALUATION COMPLEXITY: Low     GOALS: Goals reviewed with patient? Yes   SHORT TERM GOALS: Target date: 02/18/2022      Pt to be IND with initial HEP for therapeutic progression. Baseline:  Status: 02/13/22: min cues required Goal status: MET 02/19/22       LONG TERM GOALS: Target date: 03/11/2022   Pt to verbalize/ demo efficient posture and lifting mechanics to reduce and prevent neck and RUE referred pain  Baseline:  Status: 02/17/22: reports being more mindful of posture/ began hip hinge in clinic Goal status: MET 02/24/2022   2.  Pt maintain functional cervical mobility with no report of pain or limitations with R/ L rotation to assist with safety with driving.  Baseline:  Status: 02/17/22: no pain with AROM rotation- see objective  Goal status: MET 02/17/2022   3.  Pt to report reduction in RUE referred symptoms for >/= 1 week for improvement in condition.  Baseline:  Status: no RU pain today 02/17/22 Status: 02/19/22 no RU pain  Goal status: MET 02/24/2022   4.   Pt to reduce cervical pain and referred RUE pain to </= 2/10 max for improvemend of condition Baseline:  Status"  02/17/22: no pain today in Neck or RUE Status: 02/19/22: no pain today  Goal status: MET 02/24/2022  5.  Improve FOTO score to >/=63% to demo improvement in function Baseline: pt scored higher than predicted at74%; retaken 61% (predicted 63%)  Goal status: MET 02/24/2022   6.  Pt to be IND with all HEP and will be able to maintain and progress their current LOF IND Baseline:  Goal status: MET  02/24/2022     PLAN: PT FREQUENCY: 1-2x/week   PT DURATION: 6 weeks   PLANNED INTERVENTIONS: Therapeutic exercises, Therapeutic activity, Neuromuscular re-education, Balance training, Gait training, Patient/Family education, Self Care, Joint mobilization, Aquatic Therapy, Dry Needling, Spinal mobilization, Cryotherapy, Moist heat, Taping, Traction, Ultrasound, Ionotophoresis 4mg /ml Dexamethasone, Manual therapy, and Re-evaluation   PLAN FOR NEXT SESSION: D/C    Analia Zuk PT, DPT, LAT, ATC  02/24/22  10:57 AM      PHYSICAL THERAPY DISCHARGE SUMMARY  Visits from Start of Care: 6  Current functional level related to goals / functional outcomes: See goals, FOTO 66%   Remaining deficits: See assessment   Education / Equipment: HEP, theraband, posture, lifting mechanics   Patient agrees to discharge. Patient goals were met. Patient is being discharged due to meeting the stated rehab goals.  Cydney Alvarenga PT, DPT, LAT, ATC  02/24/22  10:57 AM

## 2022-02-26 ENCOUNTER — Ambulatory Visit: Payer: PPO | Admitting: Physical Therapy

## 2022-02-27 NOTE — Progress Notes (Signed)
   I, Peterson Lombard, LAT, ATC acting as a scribe for Lynne Leader, MD.  Monique [name] is a 81 y.o. female who presents to [place] [facility] today for R arm pain thought to be due to cervical radiculopathy. Pt was last seen by Dr. Georgina Snell on 01/17/22 and was prescribed gabapentin and referred to PT, completing 6 visits and was d/c on 8/28. Today, pt reports she is feeling great, 99.9% better.   In the interim she had an allergic reaction/intolerance to Bactrim causing stomach pain and GI upset.  Additionally she was stung by wasp on her neck.  Both of these issues were managed in urgent care and she is feeling better from them.  Dx imaging: 01/17/22 R shoulder & c-spine XR  Pertinent review of systems: No fevers or chills  Relevant historical information: Hypertension   Exam:  BP 116/74   Pulse 81   Ht '4\' 11"'$  (1.499 m)   Wt 149 lb 9.6 oz (67.9 kg)   SpO2 95%   BMI 30.22 kg/m  General: Well Developed, well nourished, and in no acute distress.   MSK: C-spine: Normal appearing Nontender midline. Normal cervical motion. Upper extremity strength is intact.    Lab and Radiology Results EXAM: CERVICAL SPINE - 2-3 VIEW   COMPARISON:  None Available.   FINDINGS: No evidence of acute fracture or malalignment. Advanced multilevel cervical spondylosis throughout the cervical spine most significant at C3-C4 where there is near complete loss of disc space. Posterior disc osteophyte complex is visualized at C3-C4, C5-C6 and C6-C7. No focal soft tissue abnormality.   IMPRESSION: Advanced multilevel cervical spondylosis most severe at C3-C4 where there is near complete loss of disc space.   No evidence of fracture, malalignment or osseous lesion.     Electronically Signed   By: Jacqulynn Cadet M.D.   On: 01/18/2022 07:11   I, Lynne Leader, personally (independently) visualized and performed the interpretation of the images attached in this  note.     Assessment and Plan: 81 y.o. female with cervical radiculopathy.  Significantly improved with PT.  Check back as needed.  Continue home exercise program.   Additionally clarified her intolerance to Bactrim antibiotics.  This is not a true allergy.  I did add it to her allergy list as an intolerance correctly.      Discussed warning signs or symptoms. Please see discharge instructions. Patient expresses understanding.   The above documentation has been reviewed and is accurate and complete Lynne Leader, M.D.

## 2022-02-28 ENCOUNTER — Ambulatory Visit: Payer: PPO | Admitting: Family Medicine

## 2022-02-28 VITALS — BP 116/74 | HR 81 | Ht 59.0 in | Wt 149.6 lb

## 2022-02-28 DIAGNOSIS — Z789 Other specified health status: Secondary | ICD-10-CM

## 2022-02-28 DIAGNOSIS — M5010 Cervical disc disorder with radiculopathy, unspecified cervical region: Secondary | ICD-10-CM

## 2022-02-28 NOTE — Patient Instructions (Addendum)
Thank you for coming in today.   So glad you are feeling better!  Check back with me as needed

## 2022-03-11 DIAGNOSIS — E1139 Type 2 diabetes mellitus with other diabetic ophthalmic complication: Secondary | ICD-10-CM | POA: Diagnosis not present

## 2022-03-11 DIAGNOSIS — E1151 Type 2 diabetes mellitus with diabetic peripheral angiopathy without gangrene: Secondary | ICD-10-CM | POA: Diagnosis not present

## 2022-03-11 DIAGNOSIS — H42 Glaucoma in diseases classified elsewhere: Secondary | ICD-10-CM | POA: Diagnosis not present

## 2022-03-11 DIAGNOSIS — Z9849 Cataract extraction status, unspecified eye: Secondary | ICD-10-CM | POA: Diagnosis not present

## 2022-03-11 DIAGNOSIS — I10 Essential (primary) hypertension: Secondary | ICD-10-CM | POA: Diagnosis not present

## 2022-03-11 DIAGNOSIS — E663 Overweight: Secondary | ICD-10-CM | POA: Diagnosis not present

## 2022-03-11 DIAGNOSIS — H04123 Dry eye syndrome of bilateral lacrimal glands: Secondary | ICD-10-CM | POA: Diagnosis not present

## 2022-03-11 DIAGNOSIS — E1169 Type 2 diabetes mellitus with other specified complication: Secondary | ICD-10-CM | POA: Diagnosis not present

## 2022-03-11 DIAGNOSIS — E785 Hyperlipidemia, unspecified: Secondary | ICD-10-CM | POA: Diagnosis not present

## 2022-03-14 ENCOUNTER — Telehealth: Payer: Self-pay | Admitting: Internal Medicine

## 2022-03-14 MED ORDER — CARVEDILOL 6.25 MG PO TABS: 6.2500 mg | ORAL_TABLET | Freq: Two times a day (BID) | ORAL | 1 refills | Status: DC

## 2022-03-14 NOTE — Telephone Encounter (Signed)
Rx sent 

## 2022-03-14 NOTE — Telephone Encounter (Signed)
Medication: carvedilol (COREG) 6.25 MG tablet  Has the patient contacted their pharmacy? Yes.    Preferred Pharmacy (with phone number or street name):  [id number] [place] [address], [id number] Phone:  [phone] Fax:  [phone]  Agent: Please be advised that RX refills may take up to 3 business days. We ask that you follow-up with your pharmacy.

## 2022-03-20 ENCOUNTER — Ambulatory Visit (HOSPITAL_COMMUNITY)
Admission: EM | Admit: 2022-03-20 | Discharge: 2022-03-20 | Disposition: A | Discharge status: Home / Self Care | Payer: PPO | Attending: Family Medicine | Admitting: Family Medicine

## 2022-03-20 ENCOUNTER — Encounter (HOSPITAL_COMMUNITY): Payer: Self-pay

## 2022-03-20 ENCOUNTER — Ambulatory Visit (INDEPENDENT_AMBULATORY_CARE_PROVIDER_SITE_OTHER): Payer: PPO

## 2022-03-20 DIAGNOSIS — M25562 Pain in left knee: Secondary | ICD-10-CM

## 2022-03-20 DIAGNOSIS — S80211A Abrasion, right knee, initial encounter: Secondary | ICD-10-CM | POA: Diagnosis not present

## 2022-03-20 DIAGNOSIS — W19XXXA Unspecified fall, initial encounter: Secondary | ICD-10-CM | POA: Diagnosis not present

## 2022-03-20 DIAGNOSIS — S80212A Abrasion, left knee, initial encounter: Secondary | ICD-10-CM | POA: Diagnosis not present

## 2022-03-20 DIAGNOSIS — M25561 Pain in right knee: Secondary | ICD-10-CM | POA: Diagnosis not present

## 2022-03-20 NOTE — ED Triage Notes (Signed)
Patient with c/o bilateral knee pain after tripping at [facility] and falling onto knees. Abrasions noted to both knee.

## 2022-03-20 NOTE — Discharge Instructions (Addendum)
X-rays show some arthritis but no broken bones.  Tylenol 500 mg--2 every 6 hours as needed for pain.  Ice may also help how those knees feel.

## 2022-03-20 NOTE — ED Provider Notes (Signed)
[place]    CSN: [id number] Arrival date & time: 03/20/22  1220      History   Chief Complaint Chief Complaint  Patient presents with   Knee Pain    HPI Monique [name] is a 81 y.o. female.    Knee Pain  Here for bilateral knee pain and abrasions.  This morning when she was at [facility] she tripped over the concrete bumper at the front of a parking space and fell onto her knees on the concrete.  She has abrasions there.  She just got her Tdap vaccination done.  She is allergic to penicillin and sulfa.   Past Medical History:  Diagnosis Date   Allergy    Anemia    when had uterine fibroids   Anxiety    Arthritis    Cataract    very small   Cholelithiasis    Closed fracture of base of fifth metacarpal bone of left hand 06/01/2018   DJD (degenerative joint disease)    Dry eye syndrome    Headache(784.0)    Hyperlipidemia 01/20/2011   Hypertension 09/29/2006   Pinched nerve in neck    Substance abuse (HCC)    Thrombocytopenia ([place]) 08/27/2011   hematology evaluation 08-2011, return prn   Urticaria    Vasomotor rhinitis     Patient Active Problem List   Diagnosis Date Noted   Vasomotor rhinitis 04/11/2021   Urticaria 12/15/2019   MCI (mild cognitive impairment) 02/06/2017   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   GERD (gastroesophageal reflux disease) 06/05/2016   PCP NOTES >>>>>>>>>>>>>>>>>>>>>>>>> 02/04/2016   Severe allergic reaction 08/17/2014   Allergic rhinitis 12/01/2011   Thrombocytopenia ([place]) 08/27/2011   Hyperlipidemia 01/20/2011   Annual physical exam 09/18/2010   Hyperglycemia 09/17/2009   Backache 07/10/2008   Anxiety and depression 01/19/2008   HX OF GALLSTONE 10/20/2006   Essential hypertension 09/29/2006    Past Surgical History:  Procedure Laterality Date   ABDOMINAL HYSTERECTOMY     no oophorectomy   APPENDECTOMY     CHOLECYSTECTOMY N/A 06/06/2016   Procedure: LAPAROSCOPIC CHOLECYSTECTOMY WITH INTRAOPERATIVE CHOLANGIOGRAM;   Surgeon: Johnathan Hausen, MD;  Location: [facility];  Service: General;  Laterality: N/A;   DILATION AND CURETTAGE OF UTERUS     EYE SURGERY Bilateral 05/06/2019   HERNIA REPAIR     mole removals     TONSILLECTOMY     UMBILICAL HERNIA REPAIR      OB History   No obstetric history on file.      Home Medications    Prior to Admission medications   Medication Sig Start Date End Date Taking? Authorizing Provider  acetaminophen (TYLENOL) 500 MG tablet Take 500-1,000 mg by mouth daily as needed.    [provider]  blood glucose meter kit and supplies Dispense based on patient and insurance preference. Check blood sugars no more than twice daily 05/25/19   Kathlene November E, MD  brimonidine [facility]) 0.2 % ophthalmic solution 1 drop 2 (two) times daily. 10/28/21   [provider]  Calcium-Vitamin D (CALCIUM + D PO) Take 2 tablets by mouth daily with lunch. Calcium 658m  Vitamin D 800 Units    [provider]  carvedilol (COREG) 6.25 MG tablet Take 1 tablet (6.25 mg total) by mouth 2 (two) times daily with a meal. 03/14/22   PColon Branch MD  cetirizine (ZYRTEC) 10 MG tablet Take 10 mg by mouth daily. 05/03/20   PColon Branch MD  cycloSPORINE (RESTASIS) 0.05 %  ophthalmic emulsion Place 1 drop into both eyes 2 (two) times daily.    [provider]  diclofenac Sodium (VOLTAREN) 1 % GEL Apply topically 4 (four) times daily. If needed    [provider]  diphenhydrAMINE (BENADRYL) 12.5 MG/5ML liquid Take 25 mg by mouth every 6 (six) hours as needed for allergies. Reported on 08/06/2015    [provider]  EPINEPHrine (EPIPEN 2-PAK) 0.3 mg/0.3 mL IJ SOAJ injection Inject 0.3 mLs (0.3 mg total) into the muscle once. 11/27/15   Colon Branch, MD  ezetimibe (ZETIA) 10 MG tablet Take 1 tablet (10 mg total) by mouth daily. 01/14/22   Colon Branch, MD  famotidine (PEPCID) 20 MG tablet Take 20 mg by mouth 2 (two) times daily. 03/09/18   Colon Branch, MD  fluticasone  [facility]) 50 MCG/ACT nasal spray Place 1-2 sprays into both nostrils daily as needed for allergies or rhinitis.    [provider]  Lancets (ONETOUCH DELICA PLUS [id number]) [place] BLOOD SUGAR NO MORE THAN TWICE DAILY 05/03/21   Mosie Lukes, MD  latanoprost (XALATAN) 0.005 % ophthalmic solution Place 1 drop into the left eye at bedtime. Dr. [name]    [provider]  Magnesium 250 MG TABS 1 tablet with a meal    [provider]  meclizine (ANTIVERT) 25 MG tablet Take 25 mg by mouth daily as needed for dizziness.    [provider]  Methylcellulose, Laxative, (SOLUBLE FIBER PO) Take by mouth daily as needed. Benefiber    [provider]  mometasone (ELOCON) 0.1 % cream     [provider]  ONETOUCH ULTRA test strip CHECK BLOOD SUGAR NO MORE THAN TWICE DAILY 05/03/21   Mosie Lukes, MD  Polyethyl Glycol-Propyl Glycol 0.4-0.3 % SOLN Place 1 drop into both eyes daily as needed (dry eyes). Systane    [provider]  spironolactone (ALDACTONE) 25 MG tablet TAKE 1 TABLET (25 MG TOTAL) BY MOUTH DAILY. 01/24/22   Colon Branch, MD  triamcinolone cream (KENALOG) 0.1 % APPLY THIN COAT TO AFFECTED AREA TWICE A DAY 02/03/22   [provider]  vitamin C (ASCORBIC ACID) 500 MG tablet Take 500 mg by mouth daily.    [provider]    Family History Family History  Problem Relation Age of Onset   Coronary artery disease Father    Cancer Father        unsure of type   Other Father        Guillain- barre syndrome   Diabetes Other        uncles    Hypertension Mother    Anxiety disorder Mother    Depression Mother    Prostate cancer Other        grandfather   Breast cancer Sister 48   Depression Maternal Uncle    Colon cancer Neg Hx    Colon polyps Neg Hx    Rectal cancer Neg Hx    Stomach cancer Neg Hx     Social History Social History   Tobacco Use   Smoking status: Never   Smokeless tobacco: Never   Vaping Use   Vaping Use: Never used  Substance Use Topics   Alcohol use: No   Drug use: No     Allergies   Bee venom, Doxycycline, Penicillins, Bee pollen, Azithromycin, Bactrim [sulfamethoxazole-trimethoprim], Cerumenex [trolamine (triethanolamine)], Climara [estradiol], Nickel, and Sertraline   Review of Systems Review of Systems   Physical Exam  Triage Vital Signs ED Triage Vitals  Enc Vitals Group     BP 03/20/22 1254 117/66     Pulse Rate 03/20/22 1254 67     Resp 03/20/22 1254 16     Temp 03/20/22 1254 98.5 F (36.9 C)     Temp Source 03/20/22 1254 Oral     SpO2 03/20/22 1254 98 %     Weight --      Height --      Head Circumference --      Peak Flow --      Pain Score 03/20/22 1252 6     Pain Loc --      Pain Edu? --      Excl. in [place]? --    No data found.  Updated Vital Signs BP 117/66 (BP Location: Left Arm)   Pulse 67   Temp 98.5 F (36.9 C) (Oral)   Resp 16   SpO2 98%   Visual Acuity Right Eye Distance:   Left Eye Distance:   Bilateral Distance:    Right Eye Near:   Left Eye Near:    Bilateral Near:     Physical Exam Vitals reviewed.  Constitutional:      General: She is not in acute distress.    Appearance: She is not ill-appearing, toxic-appearing or diaphoretic.  Musculoskeletal:     Right lower leg: No edema.     Left lower leg: No edema.     Comments: There is some tenderness of the anterior knees bilaterally.  Skin:    Coloration: Skin is not jaundiced or pale.     Comments: There are shallow abrasions on both knees.  On the left there is a 1 cm laceration.  It is not bleeding.  Also on the right knee there are 2 smaller abrasions.  Each 1 is about 0.5 cm in diameter.      UC Treatments / Results  Labs (all labs ordered are listed, but only abnormal results are displayed) Labs Reviewed - No data to display  EKG   Radiology DG Knee 2 Views Left  Result Date: 03/20/2022 CLINICAL DATA:  Bilateral knee pain after falling  onto bilateral knees. Bilateral anterior knee abrasions. EXAM: LEFT KNEE - 1-2 VIEW COMPARISON:  None available FINDINGS: Mild medial compartment joint space narrowing. Mild lateral compartment chondrocalcinosis. Mild chronic enthesopathic change at the quadriceps insertion on the patella. No joint effusion. No acute fracture or dislocation. IMPRESSION: No acute fracture. Electronically Signed   By: Yvonne Kendall M.D.   On: 03/20/2022 13:48   DG Knee 2 Views Right  Result Date: 03/20/2022 CLINICAL DATA:  Tripped and fell onto both knees on concrete this morning. Bilateral knee abrasions. EXAM: RIGHT KNEE - 1-2 VIEW COMPARISON:  None Available. FINDINGS: Mild medial and lateral compartment joint space narrowing. Mild lateral compartment peripheral degenerative osteophytosis. Mild chronic enthesopathic change at the quadriceps insertion on the patella. No joint effusion. No acute fracture or dislocation. IMPRESSION: No acute fracture. Electronically Signed   By: Yvonne Kendall M.D.   On: 03/20/2022 13:41    Procedures Procedures (including critical care time)  Medications Ordered in UC Medications - No data to display  Initial Impression / Assessment and Plan / UC Course  I have reviewed the triage vital signs and the nursing notes.  Pertinent labs & imaging results that were available during my care of the patient were reviewed by me and considered in my medical decision making (see chart for details).  X-rays are negative for fracture.  She does have some osteoarthritis.  We discussed using Tylenol and wound care.  She is up-to-date on her tetanus as she just had it. Final Clinical Impressions(s) / UC Diagnoses   Final diagnoses:  Acute pain of both knees     Discharge Instructions      X-rays show some arthritis but no broken bones.  Tylenol 500 mg--2 every 6 hours as needed for pain.  Ice may also help how those knees feel.     ED Prescriptions   None    PDMP not  reviewed this encounter.   Barrett Henle, MD 03/20/22 [phone]

## 2022-03-24 ENCOUNTER — Telehealth: Payer: Self-pay | Admitting: Internal Medicine

## 2022-03-24 NOTE — Telephone Encounter (Signed)
Patient wants to know if Dr. Larose Kells recommends her getting the new RSV vaccination. Please call to advise.

## 2022-03-24 NOTE — Telephone Encounter (Signed)
Is indicated in anybody over 60, so the answer is yes. To be taking 2 weeks away from other vaccines.

## 2022-03-24 NOTE — Telephone Encounter (Signed)
LMOM informing Pt of below.

## 2022-03-24 NOTE — Telephone Encounter (Signed)
Please advise 

## 2022-03-27 DIAGNOSIS — H18413 Arcus senilis, bilateral: Secondary | ICD-10-CM | POA: Diagnosis not present

## 2022-03-27 DIAGNOSIS — H26491 Other secondary cataract, right eye: Secondary | ICD-10-CM | POA: Diagnosis not present

## 2022-03-27 DIAGNOSIS — H401132 Primary open-angle glaucoma, bilateral, moderate stage: Secondary | ICD-10-CM | POA: Diagnosis not present

## 2022-03-27 DIAGNOSIS — H26493 Other secondary cataract, bilateral: Secondary | ICD-10-CM | POA: Diagnosis not present

## 2022-03-27 DIAGNOSIS — Z961 Presence of intraocular lens: Secondary | ICD-10-CM | POA: Diagnosis not present

## 2022-03-31 ENCOUNTER — Ambulatory Visit (HOSPITAL_BASED_OUTPATIENT_CLINIC_OR_DEPARTMENT_OTHER)
Admission: RE | Admit: 2022-03-31 | Discharge: 2022-03-31 | Disposition: A | Discharge status: Home / Self Care | Payer: PPO | Source: Ambulatory Visit | Attending: Internal Medicine | Admitting: Internal Medicine

## 2022-03-31 ENCOUNTER — Other Ambulatory Visit (HOSPITAL_BASED_OUTPATIENT_CLINIC_OR_DEPARTMENT_OTHER): Payer: Self-pay

## 2022-03-31 ENCOUNTER — Encounter (HOSPITAL_BASED_OUTPATIENT_CLINIC_OR_DEPARTMENT_OTHER): Payer: Self-pay

## 2022-03-31 DIAGNOSIS — Z1231 Encounter for screening mammogram for malignant neoplasm of breast: Secondary | ICD-10-CM | POA: Insufficient documentation

## 2022-03-31 MED ORDER — AREXVY 120 MCG/0.5ML IM SUSR
INTRAMUSCULAR | 0 refills | Status: DC
Start: 2022-03-31 — End: 2022-04-01
  Filled 2022-03-31: qty 1, 1d supply, fill #0

## 2022-04-01 ENCOUNTER — Encounter: Payer: Self-pay | Admitting: Internal Medicine

## 2022-04-01 ENCOUNTER — Ambulatory Visit (INDEPENDENT_AMBULATORY_CARE_PROVIDER_SITE_OTHER): Payer: PPO | Admitting: Internal Medicine

## 2022-04-01 VITALS — BP 124/76 | HR 81 | Temp 98.2°F | Resp 16 | Ht 59.0 in | Wt 151.1 lb

## 2022-04-01 DIAGNOSIS — H6121 Impacted cerumen, right ear: Secondary | ICD-10-CM

## 2022-04-01 NOTE — Progress Notes (Unsigned)
Subjective:    Patient ID: Monique [name], female    DOB: January 08, 1941, 81 y.o.   MRN: [id number]  DOS:  04/01/2022 Type of visit - description: Acute  2 weeks history of left ear discomfort described as "aggravation". No fever or chills No ear discharge or bleeding. No recent URI.   Review of Systems See above   Past Medical History:  Diagnosis Date   Allergy    Anemia    when had uterine fibroids   Anxiety    Arthritis    Cataract    very small   Cholelithiasis    Closed fracture of base of fifth metacarpal bone of left hand 06/01/2018   DJD (degenerative joint disease)    Dry eye syndrome    Headache(784.0)    Hyperlipidemia 01/20/2011   Hypertension 09/29/2006   Pinched nerve in neck    Substance abuse (HCC)    Thrombocytopenia ([place]) 08/27/2011   hematology evaluation 08-2011, return prn   Urticaria    Vasomotor rhinitis     Past Surgical History:  Procedure Laterality Date   ABDOMINAL HYSTERECTOMY     no oophorectomy   APPENDECTOMY     CHOLECYSTECTOMY N/A 06/06/2016   Procedure: LAPAROSCOPIC CHOLECYSTECTOMY WITH INTRAOPERATIVE CHOLANGIOGRAM;  Surgeon: Johnathan Hausen, MD;  Location: [facility];  Service: General;  Laterality: N/A;   DILATION AND CURETTAGE OF UTERUS     EYE SURGERY Bilateral 05/06/2019   HERNIA REPAIR     mole removals     TONSILLECTOMY     UMBILICAL HERNIA REPAIR      Current Outpatient Medications  Medication Instructions   acetaminophen (TYLENOL) 500-1,000 mg, Oral, Daily PRN   ascorbic acid (VITAMIN C) 500 mg, Oral, Daily   blood glucose meter kit and supplies Dispense based on patient and insurance preference. Check blood sugars no more than twice daily   brimonidine (ALPHAGAN) 0.2 % ophthalmic solution 1 drop, 2 times daily   Calcium-Vitamin D (CALCIUM + D PO) 2 tablets, Oral, Daily with lunch, Calcium 650m <BR>Vitamin D 800 Units   carvedilol (COREG) 6.25 mg, Oral, 2 times daily with meals   cetirizine (ZYRTEC) 10 mg, Daily    cycloSPORINE (RESTASIS) 0.05 % ophthalmic emulsion 1 drop, Both Eyes, 2 times daily   diclofenac Sodium (VOLTAREN) 1 % GEL Topical, 4 times daily, If needed   diphenhydrAMINE (BENADRYL) 25 mg, Oral, Every 6 hours PRN, Reported on 08/06/2015   EPINEPHrine (EPIPEN 2-PAK) 0.3 mg, Intramuscular,  Once   ezetimibe (ZETIA) 10 mg, Oral, Daily   famotidine (PEPCID) 20 mg, 2 times daily   fluticasone (FLONASE) 50 MCG/ACT nasal spray 1-2 sprays, Daily PRN   Lancets (ONETOUCH DELICA PLUS [id number] MISC CHECK BLOOD SUGAR NO MORE THAN TWICE DAILY   latanoprost (XALATAN) 0.005 % ophthalmic solution 1 drop, Left Eye, Daily at bedtime, Dr. [name]   Magnesium 250 MG TABS 1 tablet with a meal   meclizine (ANTIVERT) 25 mg, Oral, Daily PRN   Methylcellulose, Laxative, (SOLUBLE FIBER PO) Daily PRN   mometasone (ELOCON) 0.1 % cream    ONETOUCH ULTRA test strip CHECK BLOOD SUGAR NO MORE THAN TWICE DAILY   Polyethyl Glycol-Propyl Glycol 0.4-0.3 % SOLN 1 drop, Both Eyes, Daily PRN, Systane   spironolactone (ALDACTONE) 25 mg, Oral, Daily   triamcinolone cream (KENALOG) 0.1 % APPLY THIN COAT TO AFFECTED AREA TWICE A DAY       Objective:   Physical Exam BP 124/76   Pulse 81   Temp 98.2  F (36.8 C) (Oral)   Resp 16   Ht 4' 11"  (1.499 m)   Wt 151 lb 2 oz (68.5 kg)   SpO2 98%   BMI 30.52 kg/m  General:   Well developed, NAD, BMI noted. HEENT:  Normocephalic . Face symmetric, atraumatic R ear: Cerumen impaction L ear: Small amount of wax noted, canal normal, TM normal, no trago sign Skin: Not pale. Not jaundice Neurologic:  alert & oriented X3.  Speech normal, gait appropriate for age and unassisted Psych--  Cognition and judgment appear intact.  Cooperative with normal attention span and concentration.  Behavior appropriate. No anxious or depressed appearing.      Assessment     ASSESSMENT Prediabetes HTN Hyperlipidemia (2021: Off Pravachol as recommended by her allergist due to urticaria  I'm reluctant to rx statins) Thrombocytopenia, hematology eval 2013, RTC PRN Allergic rhinitis, urticaria, severe allergic reaction with hives in 2016.-- Dr Donneta Romberg Dry eye syndrome, cataracts s/p implants B, glaucoma,  Psych: --Anxiety-depression: d/t husband's health (lost husband 2015), taking care of fam members --Hoarding  Behavior --MCI dx by neuropsychology-- 2018 Cholecystitis and choledocholithiasis: 05-2016, s/p surgery, ERCP   PLAN: Cerumen impaction: Cerumen impaction on the R, no symptoms. With the patient consent I introduced a spoon to the ear canal an abundant amount of wax was removed.  After the procedure, the canal and the TM look normal. Has discomfort at the left ear with a small amount of wax. Recommend peroxide drops on both sides and call if no better.  See AVS  7=== Here for CPX Pre-DM check A1c. HTN: BP is very good, continue carvedilol, Aldactone.  Check CMP, CBC, TSH. Hyperlipidemia: On Zetia, history of urticaria with Pravachol, I am hesitant to prescribe another statin. H/o thrombocytopenia: Checking labs Shoulder pain: As described above, on clinical grounds doubt radiculopathy.  Refer to sports medicine.  Polypharmacy: See last visit, today where deleting from her med list apple cider, Tessalon Perles, co-Q10, hydroxyzine. Patient reports that many other meds on her list are used prn. RTC 4-5 months

## 2022-04-01 NOTE — Patient Instructions (Addendum)
Get over-the-counter peroxide: Few drops on each ear twice daily for 5 days Then only as needed  Call if you are not getting better.  Call anytime if you have ear pain or other symptoms    Vaccines I recommend:  Covid booster

## 2022-04-02 NOTE — Addendum Note (Signed)
Addended by: Kathlene November E on: 04/02/2022 06:00 PM   Modules accepted: Level of Service

## 2022-04-02 NOTE — Assessment & Plan Note (Signed)
Cerumen impaction: Cerumen impaction on the R, no symptoms. With the patient consent I introduced a spoon to the ear canal an abundant amount of wax was removed.  After the procedure, the canal and the TM look normal. Has discomfort at the left ear with a small amount of wax. Recommend peroxide drops on both sides and call if no better.  See AVS

## 2022-04-15 DIAGNOSIS — H26492 Other secondary cataract, left eye: Secondary | ICD-10-CM | POA: Diagnosis not present

## 2022-04-25 ENCOUNTER — Telehealth: Payer: Self-pay

## 2022-04-25 ENCOUNTER — Other Ambulatory Visit (HOSPITAL_COMMUNITY): Payer: Self-pay

## 2022-04-25 NOTE — Telephone Encounter (Signed)
Mailed ABBVIE renewal application to patient home.   Sandre Kitty [employer] Patient Advocate

## 2022-05-02 ENCOUNTER — Other Ambulatory Visit: Payer: Self-pay | Admitting: Internal Medicine

## 2022-05-05 NOTE — Telephone Encounter (Addendum)
Pt called with questions regarding application.   Pt rec'd re-enrollment application and will return in envelope provided.

## 2022-05-11 ENCOUNTER — Other Ambulatory Visit: Payer: Self-pay | Admitting: Family Medicine

## 2022-05-12 ENCOUNTER — Ambulatory Visit (INDEPENDENT_AMBULATORY_CARE_PROVIDER_SITE_OTHER): Payer: PPO | Admitting: Internal Medicine

## 2022-05-12 ENCOUNTER — Encounter: Payer: Self-pay | Admitting: Internal Medicine

## 2022-05-12 VITALS — BP 124/68 | HR 62 | Temp 98.0°F | Resp 16 | Ht 59.0 in | Wt 151.2 lb

## 2022-05-12 DIAGNOSIS — F419 Anxiety disorder, unspecified: Secondary | ICD-10-CM | POA: Diagnosis not present

## 2022-05-12 DIAGNOSIS — R739 Hyperglycemia, unspecified: Secondary | ICD-10-CM | POA: Diagnosis not present

## 2022-05-12 DIAGNOSIS — F32A Depression, unspecified: Secondary | ICD-10-CM

## 2022-05-12 DIAGNOSIS — I10 Essential (primary) hypertension: Secondary | ICD-10-CM

## 2022-05-12 LAB — HEMOGLOBIN A1C: Hgb A1c MFr Bld: 5.9 % (ref 4.6–6.5)

## 2022-05-12 LAB — BASIC METABOLIC PANEL
BUN: 12 mg/dL (ref 6–23)
CO2: 32 mEq/L (ref 19–32)
Calcium: 9.9 mg/dL (ref 8.4–10.5)
Chloride: 104 mEq/L (ref 96–112)
Creatinine, Ser: 0.97 mg/dL (ref 0.40–1.20)
GFR: 54.67 mL/min — ABNORMAL LOW (ref >60.00)
Glucose, Bld: 91 mg/dL (ref 70–99)
Potassium: 4.8 mEq/L (ref 3.5–5.1)
Sodium: 142 mEq/L (ref 135–145)

## 2022-05-12 NOTE — Assessment & Plan Note (Signed)
Routine checkup Prediabetes check A1c.  She is trying to eat healthy, exercises and takes walks twice weekly. HTN: Reports normal ambulatory BPs, continue carvedilol, Aldactone, last potassium 5.0.  Check BMP Anxiety, depression, h/o hoarding behavior: Doing well emotionally.  Does not see a counselor or psychiatrist. On no meds. Social: Lives by herself, has a son in [place], plans to visit her son in [place], he had a stroke and is still struggling to some degree. Preventive care: Up-to-date on vaccines. RTC 6 months

## 2022-05-12 NOTE — Patient Instructions (Signed)
Check the  blood pressure regularly BP GOAL is between 110/65 and  135/85. If it is consistently higher or lower, let me know    GO TO THE LAB : Get the blood work     Monique [name], Organ back for   checkup in 6 months

## 2022-05-12 NOTE — Progress Notes (Signed)
Subjective:    Patient ID: Monique [name], female    DOB: 10-Jul-1940, 81 y.o.   MRN: [id number]  DOS:  05/12/2022 Type of visit - description: Follow-up Since the last office visit is doing well. Has no major concerns. Emotionally doing well. Reports ambulatory BPs and CBGs are good.   Review of Systems See above   Past Medical History:  Diagnosis Date   Allergy    Anemia    when had uterine fibroids   Anxiety    Arthritis    Cataract    very small   Cholelithiasis    Closed fracture of base of fifth metacarpal bone of left hand 06/01/2018   DJD (degenerative joint disease)    Dry eye syndrome    Headache(784.0)    Hyperlipidemia 01/20/2011   Hypertension 09/29/2006   Pinched nerve in neck    Substance abuse (HCC)    Thrombocytopenia ([place]) 08/27/2011   hematology evaluation 08-2011, return prn   Urticaria    Vasomotor rhinitis     Past Surgical History:  Procedure Laterality Date   ABDOMINAL HYSTERECTOMY     no oophorectomy   APPENDECTOMY     CHOLECYSTECTOMY N/A 06/06/2016   Procedure: LAPAROSCOPIC CHOLECYSTECTOMY WITH INTRAOPERATIVE CHOLANGIOGRAM;  Surgeon: Johnathan Hausen, MD;  Location: [facility];  Service: General;  Laterality: N/A;   DILATION AND CURETTAGE OF UTERUS     EYE SURGERY Bilateral 05/06/2019   HERNIA REPAIR     mole removals     TONSILLECTOMY     UMBILICAL HERNIA REPAIR      Current Outpatient Medications  Medication Instructions   acetaminophen (TYLENOL) 500-1,000 mg, Oral, Daily PRN   ascorbic acid (VITAMIN C) 500 mg, Oral, Daily   blood glucose meter kit and supplies Dispense based on patient and insurance preference. Check blood sugars no more than twice daily   brimonidine (ALPHAGAN) 0.2 % ophthalmic solution 1 drop, 2 times daily   Calcium-Vitamin D (CALCIUM + D PO) 2 tablets, Oral, Daily with lunch, Calcium 62m <BR>Vitamin D 800 Units   carvedilol (COREG) 6.25 mg, Oral, 2 times daily with meals   cycloSPORINE (RESTASIS) 0.05 %  ophthalmic emulsion 1 drop, Both Eyes, 2 times daily   diclofenac Sodium (VOLTAREN) 1 % GEL Topical, 4 times daily, If needed   diphenhydrAMINE (BENADRYL) 25 mg, Oral, Every 6 hours PRN, Reported on 08/06/2015   EPINEPHrine (EPIPEN 2-PAK) 0.3 mg, Intramuscular,  Once   ezetimibe (ZETIA) 10 mg, Oral, Daily   Lancets (ONETOUCH DELICA PLUS [id number] MISC CHECK BLOOD SUGAR NO MORE THAN TWICE DAILY   latanoprost (XALATAN) 0.005 % ophthalmic solution 1 drop, Left Eye, Daily at bedtime, Dr. [name]   Magnesium 250 MG TABS 1 tablet with a meal   meclizine (ANTIVERT) 25 mg, Oral, Daily PRN   mometasone (ELOCON) 0.1 % cream    ONETOUCH ULTRA test strip CHECK BLOOD SUGAR NO MORE THAN TWICE DAILY   Polyethyl Glycol-Propyl Glycol 0.4-0.3 % SOLN 1 drop, Both Eyes, Daily PRN, Systane   spironolactone (ALDACTONE) 25 mg, Oral, Daily   triamcinolone cream (KENALOG) 0.1 % APPLY THIN COAT TO AFFECTED AREA TWICE A DAY       Objective:   Physical Exam BP 124/68   Pulse 62   Temp 98 F (36.7 C) (Oral)   Resp 16   Ht _0  (1.499 m)   Wt 151 lb 4 oz (68.6 kg)   BMI 30.55 kg/m  General:   Well developed, NAD, BMI noted.  HEENT:  Normocephalic . Face symmetric, atraumatic Lungs:  CTA B Normal respiratory effort, no intercostal retractions, no accessory muscle use. Heart: RRR,  no murmur.  Lower extremities: no pretibial edema bilaterally  Skin: Not pale. Not jaundice Neurologic:  alert & oriented X3.  Speech normal, gait appropriate for age and unassisted Psych--  Cognition and judgment appear intact.  Cooperative with normal attention span and concentration.  Behavior appropriate. No anxious or depressed appearing.  Seems in good spirits today     Assessment     ASSESSMENT Prediabetes HTN Hyperlipidemia (2021: Off Pravachol as recommended by her allergist due to urticaria I'm reluctant to rx statins) Thrombocytopenia, hematology eval 2013, RTC PRN Allergic rhinitis, urticaria, severe  allergic reaction with hives in 2016.-- Dr Donneta Romberg GLAUCOMA, Dry eye syndrome, cataracts s/p implants B  Psych: --Anxiety-depression: d/t husband's health (lost husband 2015), taking care of fam members --Hoarding  Behavior --MCI dx by neuropsychology-- 2018 Cholecystitis and choledocholithiasis: 05-2016, s/p surgery, ERCP   PLAN: Routine checkup Prediabetes check A1c.  She is trying to eat healthy, exercises and takes walks twice weekly. HTN: Reports normal ambulatory BPs, continue carvedilol, Aldactone, last potassium 5.0.  Check BMP Anxiety, depression, h/o hoarding behavior: Doing well emotionally.  Does not see a counselor or psychiatrist. On no meds. Social: Lives by herself, has a son in [place], plans to visit her son in [place], he had a stroke and is still struggling to some degree. Preventive care: Up-to-date on vaccines. RTC 6 months

## 2022-05-16 ENCOUNTER — Encounter: Payer: Self-pay | Admitting: Podiatry

## 2022-05-16 ENCOUNTER — Ambulatory Visit: Payer: PPO | Admitting: Podiatry

## 2022-05-16 DIAGNOSIS — B351 Tinea unguium: Secondary | ICD-10-CM

## 2022-05-16 DIAGNOSIS — M79674 Pain in right toe(s): Secondary | ICD-10-CM | POA: Diagnosis not present

## 2022-05-16 DIAGNOSIS — I739 Peripheral vascular disease, unspecified: Secondary | ICD-10-CM

## 2022-05-16 DIAGNOSIS — M79675 Pain in left toe(s): Secondary | ICD-10-CM | POA: Diagnosis not present

## 2022-05-21 NOTE — Progress Notes (Signed)
  Subjective:  Patient ID: Monique [name], female    DOB: 06/17/1941,  MRN: [id number]  Monique [name] presents to clinic today for at risk foot care. Patient has h/o PAD and painful elongated mycotic toenails 1-5 bilaterally which are tender when wearing enclosed shoe gear. Pain is relieved with periodic professional debridement.  Chief Complaint  Patient presents with   Nail Problem    Prediabetic  BS-Did not check today A1C-Do not know PCP-[facility] PCP VST-05/12/2022   New problem(s): None.   PCP is Colon Branch, MD.  Allergies  Allergen Reactions   Bee Venom Anaphylaxis   Doxycycline Hives and Itching   Penicillins Itching, Nausea And Vomiting and Swelling    syncope   Bee Pollen     Other reaction(s): Unknown   Azithromycin Itching and Rash   Bactrim [Sulfamethoxazole-Trimethoprim] Other (See Comments)    Stomach pains   Cerumenex [Trolamine (Triethanolamine)] Itching and Rash   Climara [Estradiol] Itching and Rash   Nickel Rash   Sertraline Other (See Comments)    Dry Mouth   Review of Systems: Negative except as noted in the HPI.  Objective: No changes noted in today's physical examination.  Monique [name] is a pleasant 81 y.o. female in NAD. AAO x 3.  Vascular Examination: CFT <3 seconds b/l. DP/PT pulses faintly palpable b/l. Skin temperature gradient warm to warm b/l. No pain with calf compression. No ischemia or gangrene. No cyanosis or clubbing noted b/l. Pedal hair sparse. No pain with calf compression b/l. Trace edema noted BLE. Varicosities present b/l.   Neurological Examination: Protective sensation intact 5/5 intact bilaterally with 10g monofilament b/l. Vibratory sensation intact b/l.  Dermatological Examination: Pedal skin warm and supple b/l. Toenails 1-5 b/l thick, discolored, elongated with subungual debris and pain on dorsal palpation.  No open wounds b/l LE. No interdigital macerations noted b/l LE. No hyperkeratotic nor porokeratotic lesions  present on today's visit.  Musculoskeletal Examination: Muscle strength 5/5 to b/l LE. No pain, crepitus or joint limitation noted with ROM bilateral LE. No gross bony deformities bilaterally.  Radiographs: None  Last A1c:      Latest Ref Rng & Units 05/12/2022    9:06 AM 01/09/2022    3:31 PM  Hemoglobin A1C  Hemoglobin-A1c 4.6 - 6.5 % 5.9  6.1    Assessment/Plan: 1. Pain due to onychomycosis of toenails of both feet   2. PVD (peripheral vascular disease) ([place])     No orders of the defined types were placed in this encounter.   -Patient was evaluated and treated. All patient's and/or POA's questions/concerns answered on today's visit. -Patient to continue soft, supportive shoe gear daily. -Mycotic toenails 1-5 bilaterally were debrided in length and girth with sterile nail nippers and dremel without incident. -Patient/POA to call should there be question/concern in the interim.   Return in about 3 months (around 08/16/2022).  Marzetta Board, DPM

## 2022-05-26 NOTE — Telephone Encounter (Signed)
Received patient PAP renewal portion for Restasis 0.5% and have sent provider portion to the Dr. [name] (eye doctor) office.   Sandre Kitty Rx Patient Advocate

## 2022-06-20 ENCOUNTER — Other Ambulatory Visit: Payer: Self-pay | Admitting: Internal Medicine

## 2022-07-04 ENCOUNTER — Other Ambulatory Visit (HOSPITAL_COMMUNITY): Payer: Self-pay

## 2022-07-04 NOTE — Telephone Encounter (Signed)
Submitted COMPLETE application for RESTASIS  to [place] for patient assistance.   Phone: [phone]   Trajon Rosete L. CPhT Rx Patient Advocate

## 2022-07-21 ENCOUNTER — Other Ambulatory Visit: Payer: Self-pay | Admitting: Internal Medicine

## 2022-09-01 NOTE — Telephone Encounter (Signed)
I called pt to follow up with pt about PAP for RESTASIS  to [place] to let her know about her application status ( still pending needed income verification)   and pt stated that she was in [facility]  with her son who had just had a stroke, but she said she had her income information with her and she would fax it to me as she get a chance.   Sandre Kitty Rx Patient Advocate

## 2022-09-05 ENCOUNTER — Ambulatory Visit: Payer: PPO | Admitting: Podiatry

## 2022-09-08 ENCOUNTER — Other Ambulatory Visit: Payer: Self-pay

## 2022-09-08 MED ORDER — CARVEDILOL 6.25 MG PO TABS: 6.2500 mg | ORAL_TABLET | Freq: Two times a day (BID) | ORAL | 1 refills | Status: DC

## 2022-10-22 ENCOUNTER — Telehealth: Payer: Self-pay | Admitting: Internal Medicine

## 2022-10-22 NOTE — Telephone Encounter (Signed)
FYI: This call has been transferred to Access Nurse. Once the result note has been entered staff can address the message at that time.  Patient called in with the following symptoms:  Red Word:dizziness    Please advise at Mobile [phone] (mobile)  Message is routed to Provider Pool and [facility] Triage

## 2022-10-23 DIAGNOSIS — E860 Dehydration: Secondary | ICD-10-CM | POA: Diagnosis not present

## 2022-10-23 DIAGNOSIS — R42 Dizziness and giddiness: Secondary | ICD-10-CM | POA: Diagnosis not present

## 2022-10-23 DIAGNOSIS — E039 Hypothyroidism, unspecified: Secondary | ICD-10-CM | POA: Diagnosis not present

## 2022-10-23 DIAGNOSIS — R112 Nausea with vomiting, unspecified: Secondary | ICD-10-CM | POA: Diagnosis not present

## 2022-10-23 DIAGNOSIS — H6121 Impacted cerumen, right ear: Secondary | ICD-10-CM | POA: Diagnosis not present

## 2022-10-23 LAB — BASIC METABOLIC PANEL
BUN: 12 (ref 4–21)
CO2: 25 — AB (ref 13–22)
Chloride: 103 (ref 99–108)
Creatinine: 1.2 — AB (ref 0.5–1.1)
Glucose: 118
Potassium: 3.7 mEq/L (ref 3.5–5.1)
Sodium: 139 (ref 137–147)

## 2022-10-23 LAB — CBC: RBC: 5.96 — AB (ref 3.87–5.11)

## 2022-10-23 LAB — COMPREHENSIVE METABOLIC PANEL
Calcium: 9.4 (ref 8.7–10.7)
eGFR: 47

## 2022-10-23 LAB — TSH: TSH: 4.16 (ref 0.41–5.90)

## 2022-10-23 LAB — CBC AND DIFFERENTIAL
HCT: 44 (ref 36–46)
Hemoglobin: 13.6 (ref 12.0–16.0)
Platelets: 155 10*3/uL (ref 150–400)
WBC: 6

## 2022-10-23 NOTE — Telephone Encounter (Signed)
LMOM asking for call back.  

## 2022-10-23 NOTE — Telephone Encounter (Signed)
Noted thank you

## 2022-10-23 NOTE — Telephone Encounter (Signed)
Pt returned call to advise she was treated a hospital in [facility] for Dehydration but is feeling better.

## 2022-10-23 NOTE — Telephone Encounter (Signed)
Initial Comment Caller states she woke up sweating and dizzy this morning. She also feels nauseas. She is wanting to know what she should do. Translation No Disp. Time Lamount Cohen Time) Disposition Final User 10/22/2022 2:53:06 PM Attempt made - message left Zena Amos, RNClaris Che 10/22/2022 3:03:46 PM Attempt made - message left Zena Amos, RNClaris Che 10/22/2022 3:04:24 PM Send To RN Personal Zena Amos, RN, Margaret 10/22/2022 3:24:14 PM FINAL ATTEMPT MADE - message left Yes Zena Amos, RN, Margaret Final Disposition 10/22/2022 3:24:14 PM FINAL ATTEMPT MADE - message left

## 2022-10-29 ENCOUNTER — Ambulatory Visit (INDEPENDENT_AMBULATORY_CARE_PROVIDER_SITE_OTHER): Payer: PPO | Admitting: Family Medicine

## 2022-10-29 ENCOUNTER — Encounter: Payer: Self-pay | Admitting: Family Medicine

## 2022-10-29 DIAGNOSIS — R944 Abnormal results of kidney function studies: Secondary | ICD-10-CM

## 2022-10-29 DIAGNOSIS — R7989 Other specified abnormal findings of blood chemistry: Secondary | ICD-10-CM | POA: Diagnosis not present

## 2022-10-29 DIAGNOSIS — R718 Other abnormality of red blood cells: Secondary | ICD-10-CM

## 2022-10-29 NOTE — Patient Instructions (Signed)
Updating labs today Make sure you are staying well hydrated Keep upcoming follow-up with Dr. Drue Novel

## 2022-10-29 NOTE — Progress Notes (Signed)
   Acute Office Visit  Subjective:     Patient ID: Monique [name], female    DOB: [birth year], 82 y.o.   MRN: [id number]  Chief Complaint  Patient presents with   Follow-up     Patient is in today for ED follow-up.   She was recently in [place] visiting family and friends. While there last Friday, she went to the ED feeling dizzy and nauseous (she tried dramamine and a snack without much improvement). They told her she was dehydrated, and she felt better after receiving IV fluids. (States she had just done a 6-day cruise and was out in the sun more than usual, walking more than usual, and not drinking much water). They prescribed her magnesium (level was 1.5 during ED visit), meclizine, and Zofran. Since being back home she is feeling mostly better but has had a few episodes of mild vertigo symptoms. She is trying to drink plenty of fluids and eating balanced meals. No new concerns today.  Labs: TSH 4.16, Magnesium 1.5, MCV 73.5, GFR 47 CXR: no acute findings CT head: no acute findings   ROS All review of systems negative except what is listed in the HPI      Objective:    BP 121/66   Pulse 78   Ht 4\' 11"  (1.499 m)   Wt 148 lb (67.1 kg)   SpO2 96%   BMI 29.89 kg/m    Physical Exam Vitals reviewed.  Constitutional:      Appearance: Normal appearance.  Cardiovascular:     Rate and Rhythm: Normal rate and regular rhythm.     Pulses: Normal pulses.     Heart sounds: Normal heart sounds.  Pulmonary:     Effort: Pulmonary effort is normal.     Breath sounds: Normal breath sounds.  Musculoskeletal:     Right lower leg: No edema.     Left lower leg: No edema.  Skin:    General: Skin is warm and dry.  Neurological:     Mental Status: She is alert and oriented to person, place, and time.  Psychiatric:        Mood and Affect: Mood normal.        Behavior: Behavior normal.        Thought Content: Thought content normal.        Judgment: Judgment normal.     No results  found for any visits on 10/29/22.      Assessment & Plan:   Problem List Items Addressed This Visit   None Visit Diagnoses     Hypomagnesemia    -  Primary   Relevant Orders   Magnesium   Elevated TSH       Relevant Orders   TSH   T4, free   Decreased GFR       Relevant Orders   Comprehensive metabolic panel   Low mean corpuscular volume (MCV)       Relevant Orders   CBC with Differential/Platelet      Patient is feeling mostly better since being back home. No new concerns today. Rechecking lab that were abnormal in the ED. Reports she has been trying to hydrate. Scheduled to see PCP in 2 weeks for routine follow-up.  No orders of the defined types were placed in this encounter.   Return if symptoms worsen or fail to improve.  Clayborne Dana, NP

## 2022-10-30 LAB — CBC WITH DIFFERENTIAL/PLATELET
Basophils Absolute: 0.1 10*3/uL (ref 0.0–0.1)
Basophils Relative: 0.8 % (ref 0.0–3.0)
Eosinophils Absolute: 0.3 10*3/uL (ref 0.0–0.7)
Eosinophils Relative: 3.8 % (ref 0.0–5.0)
HCT: 42.1 % (ref 36.0–46.0)
Hemoglobin: 13.6 g/dL (ref 12.0–15.0)
Lymphocytes Relative: 25.9 % (ref 12.0–46.0)
Lymphs Abs: 1.8 10*3/uL (ref 0.7–4.0)
MCHC: 32.2 g/dL (ref 30.0–36.0)
MCV: 71.6 fl — ABNORMAL LOW (ref 78.0–100.0)
Monocytes Absolute: 0.6 10*3/uL (ref 0.1–1.0)
Monocytes Relative: 9.2 % (ref 3.0–12.0)
Neutro Abs: 4.2 10*3/uL (ref 1.4–7.7)
Neutrophils Relative %: 60.3 % (ref 43.0–77.0)
Platelets: 156 10*3/uL (ref 150.0–400.0)
RBC: 5.88 Mil/uL — ABNORMAL HIGH (ref 3.87–5.11)
RDW: 14.7 % (ref 11.5–15.5)
WBC: 7 10*3/uL (ref 4.0–10.5)

## 2022-10-30 LAB — COMPREHENSIVE METABOLIC PANEL
ALT: 15 U/L (ref 0–35)
AST: 20 U/L (ref 0–37)
Albumin: 4.1 g/dL (ref 3.5–5.2)
Alkaline Phosphatase: 73 U/L (ref 39–117)
BUN: 21 mg/dL (ref 6–23)
CO2: 28 mEq/L (ref 19–32)
Calcium: 9.9 mg/dL (ref 8.4–10.5)
Chloride: 102 mEq/L (ref 96–112)
Creatinine, Ser: 1.16 mg/dL (ref 0.40–1.20)
GFR: 43.97 mL/min — ABNORMAL LOW (ref >60.00)
Glucose, Bld: 87 mg/dL (ref 70–99)
Potassium: 4.9 mEq/L (ref 3.5–5.1)
Sodium: 137 mEq/L (ref 135–145)
Total Bilirubin: 0.6 mg/dL (ref 0.2–1.2)
Total Protein: 6.4 g/dL (ref 6.0–8.3)

## 2022-10-30 LAB — MAGNESIUM: Magnesium: 1.7 mg/dL (ref 1.5–2.5)

## 2022-10-30 LAB — T4, FREE: Free T4: 0.71 ng/dL (ref 0.60–1.60)

## 2022-10-30 LAB — TSH: TSH: 2.21 u[IU]/mL (ref 0.35–5.50)

## 2022-10-31 LAB — MAGNESIUM: Magnesium: 1.5

## 2022-10-31 LAB — TROPONIN I: Troponin-I: 7

## 2022-10-31 LAB — URINALYSIS, COMPLETE
Bilirubin (Urine): NEGATIVE
Blood in Urine, dipstick: NEGATIVE
Ketones, urine: NEGATIVE
Nitrites, Initial: NEGATIVE
Protein, Ur: NEGATIVE
Specific Gravity, Urine: 1.01
Urine Glucose: NEGATIVE
Urine, pH: 6.5
Urobilinogen, UA: 0.2
WBC, UA: NEGATIVE

## 2022-10-31 LAB — PHOSPHORUS: Phosphorus: 3.4

## 2022-10-31 LAB — BRAIN NATRIURETIC PEPTIDE: B Natriuretic Peptide: 143

## 2022-10-31 LAB — LIPASE: Lipase: 76

## 2022-10-31 LAB — CREATININE KINASE MB: Creatine Kinase-Total: 172

## 2022-11-10 ENCOUNTER — Ambulatory Visit (INDEPENDENT_AMBULATORY_CARE_PROVIDER_SITE_OTHER): Payer: PPO | Admitting: Internal Medicine

## 2022-11-10 ENCOUNTER — Encounter: Payer: Self-pay | Admitting: Internal Medicine

## 2022-11-10 VITALS — BP 118/68 | HR 64 | Temp 98.0°F | Resp 18 | Ht 59.0 in | Wt 150.2 lb

## 2022-11-10 DIAGNOSIS — I10 Essential (primary) hypertension: Secondary | ICD-10-CM | POA: Diagnosis not present

## 2022-11-10 LAB — BASIC METABOLIC PANEL
BUN: 17 mg/dL (ref 6–23)
CO2: 30 mEq/L (ref 19–32)
Calcium: 10.2 mg/dL (ref 8.4–10.5)
Chloride: 102 mEq/L (ref 96–112)
Creatinine, Ser: 1.1 mg/dL (ref 0.40–1.20)
GFR: 46.85 mL/min — ABNORMAL LOW (ref >60.00)
Glucose, Bld: 90 mg/dL (ref 70–99)
Potassium: 5.5 mEq/L — ABNORMAL HIGH (ref 3.5–5.1)
Sodium: 139 mEq/L (ref 135–145)

## 2022-11-10 MED ORDER — MAGNESIUM OXIDE -MG SUPPLEMENT 250 MG PO TABS: 1.0000 | ORAL_TABLET | Freq: Every day | ORAL | 0 refills | Status: AC

## 2022-11-10 NOTE — Assessment & Plan Note (Signed)
Dehydration: Went to the ER while in [place], Dx dehydration, symptoms resolved.  Feeling well. HTN: Last creatinine slightly, recheck a BMP, continue carvedilol and spironolactone, she is very conscious about good hydration Hypomagnesemia: Chronic, mild, finished Rx Mg a couple days ago, recommend to go back to her usual Mg  OTC.   L leg pain: As described above, ill-defined left, etiology unclear.  Normal neuro and vascular exam.  Recommend observation for now. RTC CPX 12/2022

## 2022-11-10 NOTE — Progress Notes (Signed)
Subjective:    Patient ID: Monique [name], female    DOB: 08-27-1940, 82 y.o.   MRN: [id number]  DOS:  11/10/2022 Type of visit - description: f/u  The patient was visiting [place] and got dizzy and nauseated.  Went to the ED, Dx dehydration, IV fluids provided, magnesium was slightly low. Was seen here for follow-up, blood work  was essentially okay except for creatinine was slightly up to 1.2. Since then she is back to normal and feeling well. She took the last prescribed Mg tablet 2 days ago.  Also for a long time has left leg discomfort, mostly at night, ill-defined.   . Occasionally has it during the day. No swelling, no redness.  No low back pain.  No shakiness or RLS type of sxs.  Review of Systems See above   Past Medical History:  Diagnosis Date   Allergy    Anemia    when had uterine fibroids   Anxiety    Arthritis    Cataract    very small   Cholelithiasis    Closed fracture of base of fifth metacarpal bone of left hand 06/01/2018   DJD (degenerative joint disease)    Dry eye syndrome    Headache(784.0)    Hyperlipidemia 01/20/2011   Hypertension 09/29/2006   Pinched nerve in neck    Substance abuse (HCC)    Thrombocytopenia (HCC) 08/27/2011   hematology evaluation 08-2011, return prn   Urticaria    Vasomotor rhinitis     Past Surgical History:  Procedure Laterality Date   ABDOMINAL HYSTERECTOMY     no oophorectomy   APPENDECTOMY     CHOLECYSTECTOMY N/A 06/06/2016   Procedure: LAPAROSCOPIC CHOLECYSTECTOMY WITH INTRAOPERATIVE CHOLANGIOGRAM;  Surgeon: Luretha Murphy, MD;  Location: [facility];  Service: General;  Laterality: N/A;   DILATION AND CURETTAGE OF UTERUS     EYE SURGERY Bilateral 05/06/2019   HERNIA REPAIR     mole removals     TONSILLECTOMY     UMBILICAL HERNIA REPAIR      Current Outpatient Medications  Medication Instructions   acetaminophen (TYLENOL) 500-1,000 mg, Oral, Daily PRN   ascorbic acid (VITAMIN C) 500 mg, Oral, Daily   blood  glucose meter kit and supplies Dispense based on patient and insurance preference. Check blood sugars no more than twice daily   brimonidine (ALPHAGAN) 0.2 % ophthalmic solution 1 drop, 2 times daily   Calcium-Vitamin D (CALCIUM + D PO) 2 tablets, Oral, Daily with lunch, Calcium 600mg  <BR>Vitamin D 800 Units   carvedilol (COREG) 6.25 mg, Oral, 2 times daily with meals   cycloSPORINE (RESTASIS) 0.05 % ophthalmic emulsion 1 drop, Both Eyes, 2 times daily   diclofenac Sodium (VOLTAREN) 1 % GEL Topical, 4 times daily, If needed   diphenhydrAMINE (BENADRYL) 25 mg, Oral, Every 6 hours PRN, Reported on 08/06/2015   EPINEPHrine (EPIPEN 2-PAK) 0.3 mg, Intramuscular,  Once   ezetimibe (ZETIA) 10 mg, Oral, Daily   glucose blood (ONETOUCH ULTRA) test strip CHECK BLOOD SUGAR NO MORE THAN TWICE DAILY   Lancets (ONETOUCH DELICA PLUS LANCET33G) MISC CHECK BLOOD SUGAR NO MORE THAN TWICE DAILY   latanoprost (XALATAN) 0.005 % ophthalmic solution 1 drop, Left Eye, Daily at bedtime, Dr. Harriette Bouillon    magnesium oxide (MAG-OX) 400 MG tablet 1 tablet, 2 times daily   meclizine (ANTIVERT) 25 mg, Oral, Daily PRN   mometasone (ELOCON) 0.1 % cream    ondansetron (ZOFRAN-ODT) 4-8 mg, Every 4 hours PRN   Polyethyl  Glycol-Propyl Glycol 0.4-0.3 % SOLN 1 drop, Both Eyes, Daily PRN, Systane   spironolactone (ALDACTONE) 25 mg, Oral, Daily   triamcinolone cream (KENALOG) 0.1 % APPLY THIN COAT TO AFFECTED AREA TWICE A DAY       Objective:   Physical Exam BP 118/68   Pulse 64   Temp 98 F (36.7 C) (Oral)   Resp 18   Ht 4\' 11"  (1.499 m)   Wt 150 lb 4 oz (68.2 kg)   SpO2 98%   BMI 30.35 kg/m  General:   Well developed, NAD, BMI noted. HEENT:  Normocephalic . Face symmetric, atraumatic Lungs:  CTA B Normal respiratory effort, no intercostal retractions, no accessory muscle use. Heart: RRR,  no murmur.  Lower extremities: no pretibial edema bilaterally.  Normal pedal pulses bilaterally Skin: Not pale. Not  jaundice Neurologic:  alert & oriented X3.  Speech normal, gait appropriate for age and unassisted.  Motor and DTR symmetric Psych--  Cognition and judgment appear intact.  Cooperative with normal attention span and concentration.  Behavior appropriate. No anxious or depressed appearing.      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  Feeling well. HTN: Last creatinine slightly, recheck a BMP, continue carvedilol and spironolactone, she is very conscious about good hydration Hypomagnesemia: Chronic, mild, finished Rx Mg a couple days ago, recommend to go back to her usual Mg  OTC.   L leg pain: As described above, ill-defined left, etiology unclear.  Normal neuro and vascular exam.  Recommend observation for now. RTC CPX 12/2022

## 2022-11-10 NOTE — Patient Instructions (Addendum)
Okay to go back on magnesium over-the-counter 250 mg daily  Get your blood work  Schedule next visit for a physical exam by 12-2022.  Please bring [place] a copy of your Healthcare Power of Attorney for your chart.

## 2022-11-20 ENCOUNTER — Other Ambulatory Visit (INDEPENDENT_AMBULATORY_CARE_PROVIDER_SITE_OTHER): Payer: PPO

## 2022-11-20 ENCOUNTER — Telehealth: Payer: Self-pay

## 2022-11-20 DIAGNOSIS — I10 Essential (primary) hypertension: Secondary | ICD-10-CM | POA: Diagnosis not present

## 2022-11-20 LAB — BASIC METABOLIC PANEL
BUN: 15 mg/dL (ref 6–23)
CO2: 32 mEq/L (ref 19–32)
Calcium: 10 mg/dL (ref 8.4–10.5)
Chloride: 104 mEq/L (ref 96–112)
Creatinine, Ser: 1 mg/dL (ref 0.40–1.20)
GFR: 52.52 mL/min — ABNORMAL LOW (ref >60.00)
Glucose, Bld: 94 mg/dL (ref 70–99)
Potassium: 5.1 mEq/L (ref 3.5–5.1)
Sodium: 141 mEq/L (ref 135–145)

## 2022-11-20 NOTE — Telephone Encounter (Signed)
Pt came in today for scheduled blood work, she wanted to let PCP know that she is still having L lower leg discomfort, no swelling, no redness. She states PCP told her to let him know if not improving.

## 2022-11-20 NOTE — Telephone Encounter (Signed)
Symptoms of leg pain were vague, recommended observation. Because symptoms continue, needs appointment for next week.  Monique [name] please schedule

## 2022-11-20 NOTE — Telephone Encounter (Signed)
Appt made for 05/31.

## 2022-11-25 NOTE — Telephone Encounter (Signed)
Patient called back about lab results:   Monique [name], [facility] 11/21/2022  9:19 AM EDT Back to Top    Results mailed.   [name], MD 11/21/2022  8:57 AM EDT     Advise patient: Potassium is better, continue taking only half spironolactone.  Monitor BPs at home.  Call if BP is not controlled.  Next visit should be around 12-2022.   Patient made aware of results. Will continue 1/2 tablet of Spironolactone and keep track of blood pressures at home. She will see Dr. Drue Novel in July, but also has appt for evaluation Friday about her legs.

## 2022-11-28 ENCOUNTER — Ambulatory Visit (INDEPENDENT_AMBULATORY_CARE_PROVIDER_SITE_OTHER): Payer: PPO | Admitting: Internal Medicine

## 2022-11-28 VITALS — BP 118/64 | HR 72 | Temp 97.5°F | Resp 16 | Ht 59.0 in | Wt 150.0 lb

## 2022-11-28 DIAGNOSIS — M79605 Pain in left leg: Secondary | ICD-10-CM

## 2022-11-28 DIAGNOSIS — M542 Cervicalgia: Secondary | ICD-10-CM

## 2022-11-28 MED ORDER — CYCLOBENZAPRINE HCL 10 MG PO TABS: 10.0000 mg | ORAL_TABLET | Freq: Every evening | ORAL | 0 refills | Status: DC | PRN

## 2022-11-28 MED ORDER — PREDNISONE 10 MG PO TABS: ORAL_TABLET | ORAL | 0 refills | Status: DC

## 2022-11-28 NOTE — Patient Instructions (Signed)
For pain:  Continue using a heating pad as needed  Take prednisone as prescribed  Continue taking Tylenol  500 mg OTC 2 tabs a day every 8 hours as needed   You can also take a muscle relaxant at bedtime, try for few days, if it helps keep taking it.  Watch for drowsiness.  We are referring you back to Dr. Logan Bores

## 2022-11-28 NOTE — Assessment & Plan Note (Signed)
Neck pain, left leg pain: Suspect radiculopathy neck and lumbar radicular pain, has seen sports medicine for neck pain before.. Plan: Round of prednisone, continue Tylenol, continue a heating pad. Also a muscle relaxant at bedtime, encouraged to try it, watch for side effects, if she does not see any benefit recommend to stop. Rerefer to sports medicine further evaluation and treatment.

## 2022-11-28 NOTE — Progress Notes (Signed)
Subjective:    Patient ID: Monique [name], female    DOB: March 24, 1941, 82 y.o.   MRN: [id number]  DOS:  11/28/2022 Type of visit - description: Acute  Has 2 concerns At the last office visit she complained of a ill-defined left leg pain. The pain continue, extends from the left buttock to the left foot. Denies any numbness, does not change by walking on flat surface but it bothers her worse when she goes up or down stairs. No lower extremity swelling or redness No bladder or bowel incontinence No actually difficulties with her gait.  Also, back on 12-2021 she had "shoulder pain".  Were referred to sports medicine, impression was radiculopathy, x-rays were done, did physical therapy and improve until a couple days ago when symptoms resurface. Denies any injury or fall. No upper extremity numbness    Review of Systems See above   Past Medical History:  Diagnosis Date   Allergy    Anemia    when had uterine fibroids   Anxiety    Arthritis    Cataract    very small   Cholelithiasis    Closed fracture of base of fifth metacarpal bone of left hand 06/01/2018   DJD (degenerative joint disease)    Dry eye syndrome    Headache(784.0)    Hyperlipidemia 01/20/2011   Hypertension 09/29/2006   Pinched nerve in neck    Substance abuse (HCC)    Thrombocytopenia (HCC) 08/27/2011   hematology evaluation 08-2011, return prn   Urticaria    Vasomotor rhinitis     Past Surgical History:  Procedure Laterality Date   ABDOMINAL HYSTERECTOMY     no oophorectomy   APPENDECTOMY     CHOLECYSTECTOMY N/A 06/06/2016   Procedure: LAPAROSCOPIC CHOLECYSTECTOMY WITH INTRAOPERATIVE CHOLANGIOGRAM;  Surgeon: Luretha Murphy, MD;  Location: [facility];  Service: General;  Laterality: N/A;   DILATION AND CURETTAGE OF UTERUS     EYE SURGERY Bilateral 05/06/2019   HERNIA REPAIR     mole removals     TONSILLECTOMY     UMBILICAL HERNIA REPAIR      Current Outpatient Medications  Medication Instructions    acetaminophen (TYLENOL) 500-1,000 mg, Oral, Daily PRN   ascorbic acid (VITAMIN C) 500 mg, Oral, Daily   blood glucose meter kit and supplies Dispense based on patient and insurance preference. Check blood sugars no more than twice daily   brimonidine (ALPHAGAN) 0.2 % ophthalmic solution 1 drop, 2 times daily   Calcium-Vitamin D (CALCIUM + D PO) 2 tablets, Oral, Daily with lunch, Calcium 600mg  <BR>Vitamin D 800 Units   carvedilol (COREG) 6.25 mg, Oral, 2 times daily with meals   cycloSPORINE (RESTASIS) 0.05 % ophthalmic emulsion 1 drop, Both Eyes, 2 times daily   diclofenac Sodium (VOLTAREN) 1 % GEL Topical, 4 times daily, If needed   diphenhydrAMINE (BENADRYL) 25 mg, Oral, Every 6 hours PRN, Reported on 08/06/2015   EPINEPHrine (EPIPEN 2-PAK) 0.3 mg, Intramuscular,  Once   ezetimibe (ZETIA) 10 mg, Oral, Daily   glucose blood (ONETOUCH ULTRA) test strip CHECK BLOOD SUGAR NO MORE THAN TWICE DAILY   Lancets (ONETOUCH DELICA PLUS LANCET33G) MISC CHECK BLOOD SUGAR NO MORE THAN TWICE DAILY   latanoprost (XALATAN) 0.005 % ophthalmic solution 1 drop, Left Eye, Daily at bedtime, Dr. Harriette Bouillon    Magnesium Oxide -Mg Supplement 250 mg, Oral, Daily   meclizine (ANTIVERT) 25 mg, Oral, Daily PRN   mometasone (ELOCON) 0.1 % cream    ondansetron (ZOFRAN-ODT) 4-8 mg,  Oral, Every 4 hours PRN   Polyethyl Glycol-Propyl Glycol 0.4-0.3 % SOLN 1 drop, Both Eyes, Daily PRN, Systane   spironolactone (ALDACTONE) 12.5 mg, Oral, Daily   triamcinolone cream (KENALOG) 0.1 %        Objective:   Physical Exam BP 118/64 (BP Location: Left Arm, Patient Position: Sitting, Cuff Size: Normal)   Pulse 72   Temp (!) 97.5 F (36.4 C) (Oral)   Resp 16   Ht 4\' 11"  (1.499 m)   Wt 150 lb (68 kg)   SpO2 95%   BMI 30.30 kg/m  General:   Well developed, NAD, BMI noted. HEENT:  Normocephalic . Face symmetric, atraumatic Neck: Slightly TTP at the base of the neck bilaterally.  ROM mildly limited  throughout d/t pain Lower  extremities: no pretibial edema bilaterally  Skin: Not pale. Not jaundice Neurologic:  alert & oriented X3.  Speech normal, gait appropriate for age and unassisted Motor in all extremities symmetric DTR symmetric Straight leg test negative Psych--  Cognition and judgment appear intact.  Cooperative with normal attention span and concentration.  Behavior appropriate. No anxious or depressed appearing.      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 Plan: Round of prednisone, continue Tylenol, continue a heating pad. Also a muscle relaxant at bedtime, encouraged to try it, watch for side effects, if she does not see any benefit recommend to stop. Rerefer to sports medicine further evaluation and treatment.

## 2022-12-03 NOTE — Progress Notes (Unsigned)
   Rubin Payor, PhD, LAT, ATC acting as a scribe for [name], MD.  Monique [name] is a 82 y.o. female who presents to [facility] today for neck and L leg pain   Pertinent review of systems: ***  Relevant historical information: ***   Exam:  There were no vitals taken for this visit. General: Well Developed, well nourished, and in no acute distress.   MSK: ***    Lab and Radiology Results No results found for this or any previous visit (from the past 72 hour(s)). No results found.     Assessment and Plan: 82 y.o. female with ***   PDMP not reviewed this encounter. No orders of the defined types were placed in this encounter.  No orders of the defined types were placed in this encounter.    Discussed warning signs or symptoms. Please see discharge instructions. Patient expresses understanding.   ***

## 2022-12-04 ENCOUNTER — Encounter: Payer: Self-pay | Admitting: Family Medicine

## 2022-12-04 ENCOUNTER — Ambulatory Visit (INDEPENDENT_AMBULATORY_CARE_PROVIDER_SITE_OTHER): Payer: PPO | Admitting: Family Medicine

## 2022-12-04 ENCOUNTER — Ambulatory Visit (INDEPENDENT_AMBULATORY_CARE_PROVIDER_SITE_OTHER): Payer: PPO

## 2022-12-04 VITALS — BP 112/76 | HR 88 | Ht 59.0 in | Wt 155.0 lb

## 2022-12-04 DIAGNOSIS — M542 Cervicalgia: Secondary | ICD-10-CM | POA: Diagnosis not present

## 2022-12-04 DIAGNOSIS — M5416 Radiculopathy, lumbar region: Secondary | ICD-10-CM

## 2022-12-04 DIAGNOSIS — R718 Other abnormality of red blood cells: Secondary | ICD-10-CM

## 2022-12-04 DIAGNOSIS — M545 Low back pain, unspecified: Secondary | ICD-10-CM | POA: Diagnosis not present

## 2022-12-04 DIAGNOSIS — M5136 Other intervertebral disc degeneration, lumbar region: Secondary | ICD-10-CM | POA: Diagnosis not present

## 2022-12-04 MED ORDER — GABAPENTIN 100 MG PO CAPS: 100.0000 mg | ORAL_CAPSULE | Freq: Every evening | ORAL | 3 refills | Status: AC | PRN

## 2022-12-04 NOTE — Patient Instructions (Addendum)
Thank you for coming in today.   Please get an Xray today before you leave   I've referred you to Physical Therapy.  Let us know if you don't hear from them in one week.   Please get labs today before you leave   Check back in 6 weeks

## 2022-12-05 LAB — IRON,TIBC AND FERRITIN PANEL
%SAT: 36 % (calc) (ref 16–45)
Ferritin: 642 ng/mL — ABNORMAL HIGH (ref 16–288)
Iron: 100 ug/dL (ref 45–160)
TIBC: 277 mcg/dL (calc) (ref 250–450)

## 2022-12-09 LAB — HGB FRACTIONATION CASCADE
Hgb A2: 2.4 % (ref 1.8–3.2)
Hgb A: 97.6 % (ref 96.4–98.8)
Hgb F: 0 % (ref 0.0–2.0)
Hgb S: 0 %

## 2022-12-10 NOTE — Progress Notes (Signed)
Iron stores are adequate and no evidence of thalassemia is present.

## 2022-12-11 ENCOUNTER — Ambulatory Visit: Payer: PPO | Attending: Family Medicine

## 2022-12-11 ENCOUNTER — Other Ambulatory Visit: Payer: Self-pay

## 2022-12-11 DIAGNOSIS — M6281 Muscle weakness (generalized): Secondary | ICD-10-CM | POA: Diagnosis not present

## 2022-12-11 DIAGNOSIS — M5459 Other low back pain: Secondary | ICD-10-CM | POA: Diagnosis not present

## 2022-12-11 DIAGNOSIS — M542 Cervicalgia: Secondary | ICD-10-CM | POA: Insufficient documentation

## 2022-12-11 DIAGNOSIS — M5416 Radiculopathy, lumbar region: Secondary | ICD-10-CM | POA: Diagnosis not present

## 2022-12-11 NOTE — Therapy (Signed)
OUTPATIENT PHYSICAL THERAPY THORACOLUMBAR EVALUATION   Patient Name: Monique [name] MRN: [id number] DOB:1941/06/17, 82 y.o., female Today's Date: 12/11/2022  END OF SESSION:  PT End of Session - 12/11/22 1036     Visit Number 1    Number of Visits 9    Date for PT Re-Evaluation 02/05/23    Authorization Type HTA    PT Start Time 0833    PT Stop Time 0910    PT Time Calculation (min) 37 min    Activity Tolerance Patient tolerated treatment well    Behavior During Therapy [facility] for tasks assessed/performed             Past Medical History:  Diagnosis Date   Allergy    Anemia    when had uterine fibroids   Anxiety    Arthritis    Cataract    very small   Cholelithiasis    Closed fracture of base of fifth metacarpal bone of left hand 06/01/2018   DJD (degenerative joint disease)    Dry eye syndrome    Headache(784.0)    Hyperlipidemia 01/20/2011   Hypertension 09/29/2006   Pinched nerve in neck    Substance abuse (HCC)    Thrombocytopenia (HCC) 08/27/2011   hematology evaluation 08-2011, return prn   Urticaria    Vasomotor rhinitis    Past Surgical History:  Procedure Laterality Date   ABDOMINAL HYSTERECTOMY     no oophorectomy   APPENDECTOMY     CHOLECYSTECTOMY N/A 06/06/2016   Procedure: LAPAROSCOPIC CHOLECYSTECTOMY WITH INTRAOPERATIVE CHOLANGIOGRAM;  Surgeon: Luretha Murphy, MD;  Location: [facility];  Service: General;  Laterality: N/A;   DILATION AND CURETTAGE OF UTERUS     EYE SURGERY Bilateral 05/06/2019   HERNIA REPAIR     mole removals     TONSILLECTOMY     UMBILICAL HERNIA REPAIR     Patient Active Problem List   Diagnosis Date Noted   Vasomotor rhinitis 04/11/2021   Urticaria 12/15/2019   MCI (mild cognitive impairment) 02/06/2017   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   GERD (gastroesophageal reflux disease) 06/05/2016   PCP NOTES >>>>>>>>>>>>>>>>>>>>>>>>> 02/04/2016   Severe allergic reaction 08/17/2014   Allergic rhinitis 12/01/2011    Thrombocytopenia (HCC) 08/27/2011   Hyperlipidemia 01/20/2011   Annual physical exam 09/18/2010   Hyperglycemia 09/17/2009   Backache 07/10/2008   Anxiety and depression 01/19/2008   HX OF GALLSTONE 10/20/2006   Essential hypertension 09/29/2006    PCP: [name], MD  REFERRING PROVIDER: Rodolph Bong, MD  REFERRING DIAG:  M54.2 (ICD-10-CM) - Neck pain M54.16 (ICD-10-CM) - Lumbar radiculopathy  Rationale for Evaluation and Treatment: Rehabilitation  THERAPY DIAG:  Cervicalgia - Plan: PT plan of care cert/re-cert  Other low back pain - Plan: PT plan of care cert/re-cert  Muscle weakness (generalized) - Plan: PT plan of care cert/re-cert  ONSET DATE: Chronic  SUBJECTIVE:  SUBJECTIVE STATEMENT: Pt presents to PT with reports of chronic but acute exacerbation of neck and LBP. Notes neck pain as more pressing concern, has pain significantly in R side of neck and R upper trap. Also has occasional LBP with referral into lateral L LE. Has had success with PT in the past with decreasing her neck pain.   PERTINENT HISTORY:  HTN  PAIN:  Are you having pain?  Yes: NPRS scale: 5/10 Worst: 10/10 Pain location: neck, R upper trap Pain description: tight, sharp Aggravating factors: unsure Relieving factors: heat  Are you having pain?  No: NPRS scale: 0/10 Worst: 5/10 Pain location: lower back, L LE Pain description: tight, sharp Aggravating factors: unsure Relieving factors: heat  PRECAUTIONS: None  WEIGHT BEARING RESTRICTIONS: No  FALLS:  Has patient fallen in last 6 months? No  LIVING ENVIRONMENT: Lives with: lives alone Lives in: House/apartment Stairs: Yes: External: 4 steps; can reach both  Has following equipment at home: N/A  OCCUPATION: Retired  PLOF:  Independent  PATIENT GOALS: decrease neck pain, improve comfort with ADLs  OBJECTIVE:   DIAGNOSTIC FINDINGS:  See imaging  PATIENT SURVEYS:  FOTO (neck): 53% function; 63% predicted FOTO (back): 53% function; 65% predicted  SCREENING FOR RED FLAGS: Bowel or bladder incontinence: No Spinal tumors: No Cauda equina syndrome: No Compression fracture: No Abdominal aneurysm: No  COGNITION: Overall cognitive status: Within functional limits for tasks assessed     SENSATION: WFL  POSTURE: rounded shoulders and forward head  PALPATION: TTP to R upper trap, cervical paraspinals  CERVICAL ROM:   AROM eval  Flexion   Extension   Right lateral flexion   Left lateral flexion   Right rotation 35  Left rotation 50   (Blank rows = not tested)  LUMBAR SPECIAL TESTS:  Straight leg raise test: Negative and Slump test: Negative  FUNCTIONAL TESTS:  30 Second Sit to Stand: 15 reps  GAIT: Distance walked: 48ft Assistive device utilized: None Level of assistance: Complete Independence Comments: decrease gait speed  TREATMENT: [facility] Adult PT Treatment:                                                DATE: 12/11/2022 Therapeutic Exercise: Row x 10 GTB Seated bilateral ER GTB x 5 Supine chin tuck x 5 - 5" hold Seated upper trap stretch x 30" R  PATIENT EDUCATION:  Education details: eval findings, FOTO, HEP, POC Person educated: Patient Education method: Explanation, Demonstration, and Handouts Education comprehension: verbalized understanding and returned demonstration  HOME EXERCISE PROGRAM: Access Code: [email or web address] URL: [email or web address] Date: 12/11/2022 Prepared by: Edwinna Areola  Exercises - Standing Shoulder Row with Anchored Resistance  - 1 x daily - 7 x weekly - 3 sets - 10 reps - green band hold - Shoulder External Rotation and Scapular Retraction with Resistance  - 1 x daily - 7 x weekly - 3 sets - 10 reps - green band hold - Supine Chin Tuck  - 1  x daily - 7 x weekly - 2 sets - 10 reps - 5 sec hold - Seated Upper Trapezius Stretch  - 1 x daily - 7 x weekly - 2 reps - 30 sec hold  ASSESSMENT:  CLINICAL IMPRESSION: Patient is a 82 y.o. F who was seen today for physical therapy evaluation and treatment for chronic neck and LBP. Physical findings are  consistent with MD impression as pt has decrease in cervical ROM and decrease in overall functional mobility. Her FOTO score for her neck and lower back show she is operating below baseline PLOF for subjective functional ability. Pt would benefit from skilled PT services working on improving neck ROM and periscapular/DNF strength as well as core endurance for decreasing neck and LBP.    OBJECTIVE IMPAIRMENTS: decreased activity tolerance.   ACTIVITY LIMITATIONS: carrying, lifting, sleeping, and reach over head  PARTICIPATION LIMITATIONS: driving, shopping, community activity, and yard work  PERSONAL FACTORS: Time since onset of injury/illness/exacerbation and 1-2 comorbidities: HTN  are also affecting patient's functional outcome.   REHAB POTENTIAL: Excellent  CLINICAL DECISION MAKING: Stable/uncomplicated  EVALUATION COMPLEXITY: Low   GOALS: Goals reviewed with patient? No  SHORT TERM GOALS: Target date: 01/01/2023   Pt will be compliant and knowledgeable with initial HEP for improved comfort and carryover Baseline: initial HEP given  Goal status: INITIAL  2.  Pt will self report neck and low back pain no greater than 6/10 for improved comfort and functional ability Baseline: 10/10 at worst Goal status: INITIAL   LONG TERM GOALS: Target date: 02/05/2023   Pt will improve FOTO (neck) function score to no less than 63% as proxy for functional improvement Baseline: 53% function Goal status: INITIAL   2.  Pt will self report neck and low back pain no greater than 3/10 for improved comfort and functional ability Baseline: 10/10 at worst Goal status: INITIAL   3.  Pt will improve  FOTO (lumbar) function score to no less than 65% as proxy for functional improvement Baseline: 53% function Goal status: INITIAL   4.  Pt will improve R cervical rotation to at least 50 degrees for improved comfort and ability with driving and home ADLs Baseline: see ROM chart  Goal status: INITIAL  PLAN:  PT FREQUENCY: 1x/week  PT DURATION: 8 weeks  PLANNED INTERVENTIONS: Therapeutic exercises, Therapeutic activity, Neuromuscular re-education, Balance training, Gait training, Patient/Family education, Self Care, Joint mobilization, Dry Needling, Electrical stimulation, Cryotherapy, Moist heat, Manual therapy, and Re-evaluation.  PLAN FOR NEXT SESSION: assess HEP response, TPDN and manual, DNF/core/periscapular strengthening   Eloy End, PT 12/11/2022, 10:37 AM

## 2022-12-12 ENCOUNTER — Other Ambulatory Visit: Payer: Self-pay | Admitting: Internal Medicine

## 2022-12-12 NOTE — Progress Notes (Signed)
Cervical spine x-ray shows arthritis that is pretty bad multiple levels in the spine.

## 2022-12-12 NOTE — Progress Notes (Signed)
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

## 2022-12-22 ENCOUNTER — Ambulatory Visit: Payer: PPO

## 2022-12-22 DIAGNOSIS — M5459 Other low back pain: Secondary | ICD-10-CM

## 2022-12-22 DIAGNOSIS — M542 Cervicalgia: Secondary | ICD-10-CM | POA: Diagnosis not present

## 2022-12-22 DIAGNOSIS — M6281 Muscle weakness (generalized): Secondary | ICD-10-CM

## 2022-12-22 NOTE — Therapy (Signed)
OUTPATIENT PHYSICAL THERAPY TREATMENT NOTE   Patient Name: Monique [name] MRN: [id number] DOB:06/11/1941, 82 y.o., female Today's Date: 12/22/2022  END OF SESSION:  PT End of Session - 12/22/22 1124     Visit Number 2    Number of Visits 9    Date for PT Re-Evaluation 02/05/23    Authorization Type HTA    PT Start Time 1130    PT Stop Time 1210    PT Time Calculation (min) 40 min    Activity Tolerance Patient tolerated treatment well    Behavior During Therapy WFL for tasks assessed/performed              Past Medical History:  Diagnosis Date   Allergy    Anemia    when had uterine fibroids   Anxiety    Arthritis    Cataract    very small   Cholelithiasis    Closed fracture of base of fifth metacarpal bone of left hand 06/01/2018   DJD (degenerative joint disease)    Dry eye syndrome    Headache(784.0)    Hyperlipidemia 01/20/2011   Hypertension 09/29/2006   Pinched nerve in neck    Substance abuse (HCC)    Thrombocytopenia (HCC) 08/27/2011   hematology evaluation 08-2011, return prn   Urticaria    Vasomotor rhinitis    Past Surgical History:  Procedure Laterality Date   ABDOMINAL HYSTERECTOMY     no oophorectomy   APPENDECTOMY     CHOLECYSTECTOMY N/A 06/06/2016   Procedure: LAPAROSCOPIC CHOLECYSTECTOMY WITH INTRAOPERATIVE CHOLANGIOGRAM;  Surgeon: Luretha Murphy, MD;  Location: [facility];  Service: General;  Laterality: N/A;   DILATION AND CURETTAGE OF UTERUS     EYE SURGERY Bilateral 05/06/2019   HERNIA REPAIR     mole removals     TONSILLECTOMY     UMBILICAL HERNIA REPAIR     Patient Active Problem List   Diagnosis Date Noted   Vasomotor rhinitis 04/11/2021   Urticaria 12/15/2019   MCI (mild cognitive impairment) 02/06/2017   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   GERD (gastroesophageal reflux disease) 06/05/2016   PCP NOTES >>>>>>>>>>>>>>>>>>>>>>>>> 02/04/2016   Severe allergic reaction 08/17/2014   Allergic rhinitis 12/01/2011   Thrombocytopenia  (HCC) 08/27/2011   Hyperlipidemia 01/20/2011   Annual physical exam 09/18/2010   Hyperglycemia 09/17/2009   Backache 07/10/2008   Anxiety and depression 01/19/2008   HX OF GALLSTONE 10/20/2006   Essential hypertension 09/29/2006    PCP: [name], MD  REFERRING PROVIDER: Rodolph Bong, MD  REFERRING DIAG:  M54.2 (ICD-10-CM) - Neck pain M54.16 (ICD-10-CM) - Lumbar radiculopathy  Rationale for Evaluation and Treatment: Rehabilitation  THERAPY DIAG:  Cervicalgia  Other low back pain  Muscle weakness (generalized)  ONSET DATE: Chronic  SUBJECTIVE:  SUBJECTIVE STATEMENT: Pt presents to PT with reports of continued neck and R upper trap pain. Has been compliant with HEP with no adverse effect.   PERTINENT HISTORY:  HTN  PAIN:  Are you having pain?  Yes: NPRS scale: 5/10 Worst: 10/10 Pain location: neck, R upper trap Pain description: tight, sharp Aggravating factors: unsure Relieving factors: heat  Are you having pain?  No: NPRS scale: 0/10 Worst: 5/10 Pain location: lower back, L LE Pain description: tight, sharp Aggravating factors: unsure Relieving factors: heat  PRECAUTIONS: None  WEIGHT BEARING RESTRICTIONS: No  FALLS:  Has patient fallen in last 6 months? No  LIVING ENVIRONMENT: Lives with: lives alone Lives in: House/apartment Stairs: Yes: External: 4 steps; can reach both  Has following equipment at home: N/A  OCCUPATION: Retired  PLOF: Independent  PATIENT GOALS: decrease neck pain, improve comfort with ADLs  OBJECTIVE:   DIAGNOSTIC FINDINGS:  See imaging  PATIENT SURVEYS:  FOTO (neck): 53% function; 63% predicted FOTO (back): 53% function; 65% predicted  COGNITION: Overall cognitive status: Within functional limits for tasks  assessed     SENSATION: WFL  POSTURE: rounded shoulders and forward head  PALPATION: TTP to R upper trap, cervical paraspinals  CERVICAL ROM:   AROM eval  Flexion   Extension   Right lateral flexion   Left lateral flexion   Right rotation 35  Left rotation 50   (Blank rows = not tested)  LUMBAR SPECIAL TESTS:  Straight leg raise test: Negative and Slump test: Negative  FUNCTIONAL TESTS:  30 Second Sit to Stand: 15 reps  GAIT: Distance walked: 95ft Assistive device utilized: None Level of assistance: Complete Independence Comments: decrease gait speed  TREATMENT: [facility] Adult PT Treatment:                                                DATE: 12/22/2022 Therapeutic Exercise: NuStep lvl 5 UE/LE x 4 min while taking subjective Row 3x10 GTB Shouler ext 2x10 GTB Seated bilateral ER GTB 2x10 Supine horizontal abd 2x10 GTB Supine chin tuck 2x10 - 5" hold Supine serratus lift 2x10 YTB  Manual Therapy: STM and trigger point release to R upper trap Suboccipital release Positional release to R upper trap Modalities: MHP to cervical spine post session x 10 min in supine  [facility] Adult PT Treatment:                                                DATE: 12/11/2022 Therapeutic Exercise: Row x 10 GTB Seated bilateral ER GTB x 5 Supine chin tuck x 5 - 5" hold Seated upper trap stretch x 30" R  PATIENT EDUCATION:  Education details: continue HEP Person educated: Patient Education method: Explanation, Demonstration, and Handouts Education comprehension: verbalized understanding and returned demonstration  HOME EXERCISE PROGRAM: Access Code: [email or web address] URL: [email or web address] Date: 12/22/2022 Prepared by: Edwinna Areola  Exercises - Standing Shoulder Row with Anchored Resistance  - 1 x daily - 7 x weekly - 3 sets - 10 reps - green band hold - Shoulder External Rotation and Scapular Retraction with Resistance  - 1 x daily - 7 x weekly - 3 sets - 10 reps - green  band hold - Supine Chin Tuck  -  1 x daily - 7 x weekly - 2 sets - 10 reps - 5 sec hold - Seated Upper Trapezius Stretch  - 1 x daily - 7 x weekly - 2 reps - 30 sec hold - Supine Shoulder Horizontal Abduction with Resistance  - 1 x daily - 7 x weekly - 2 sets - 10 reps - green band hold  ASSESSMENT:  CLINICAL IMPRESSION: Pt was able to compete prescribed exercises with no adverse effect. Therapy focused on DNF and periscapular strengthening to reduce neck pain and improve comfort. Responded well to manual therapy interventions, noting decreased pain post session. Pt progressing as expected, will continue per POC.  OBJECTIVE IMPAIRMENTS: decreased activity tolerance.   ACTIVITY LIMITATIONS: carrying, lifting, sleeping, and reach over head  PARTICIPATION LIMITATIONS: driving, shopping, community activity, and yard work  PERSONAL FACTORS: Time since onset of injury/illness/exacerbation and 1-2 comorbidities: HTN  are also affecting patient's functional outcome.   REHAB POTENTIAL: Excellent  CLINICAL DECISION MAKING: Stable/uncomplicated  EVALUATION COMPLEXITY: Low   GOALS: Goals reviewed with patient? No  SHORT TERM GOALS: Target date: 01/01/2023   Pt will be compliant and knowledgeable with initial HEP for improved comfort and carryover Baseline: initial HEP given  Goal status: INITIAL  2.  Pt will self report neck and low back pain no greater than 6/10 for improved comfort and functional ability Baseline: 10/10 at worst Goal status: INITIAL   LONG TERM GOALS: Target date: 02/05/2023   Pt will improve FOTO (neck) function score to no less than 63% as proxy for functional improvement Baseline: 53% function Goal status: INITIAL   2.  Pt will self report neck and low back pain no greater than 3/10 for improved comfort and functional ability Baseline: 10/10 at worst Goal status: INITIAL   3.  Pt will improve FOTO (lumbar) function score to no less than 65% as proxy for functional  improvement Baseline: 53% function Goal status: INITIAL   4.  Pt will improve R cervical rotation to at least 50 degrees for improved comfort and ability with driving and home ADLs Baseline: see ROM chart  Goal status: INITIAL  PLAN:  PT FREQUENCY: 1x/week  PT DURATION: 8 weeks  PLANNED INTERVENTIONS: Therapeutic exercises, Therapeutic activity, Neuromuscular re-education, Balance training, Gait training, Patient/Family education, Self Care, Joint mobilization, Dry Needling, Electrical stimulation, Cryotherapy, Moist heat, Manual therapy, and Re-evaluation.  PLAN FOR NEXT SESSION: assess HEP response, TPDN and manual, DNF/core/periscapular strengthening   Eloy End, PT 12/22/2022, 12:19 PM

## 2022-12-29 ENCOUNTER — Ambulatory Visit: Payer: PPO | Attending: Family Medicine

## 2022-12-29 DIAGNOSIS — M6281 Muscle weakness (generalized): Secondary | ICD-10-CM | POA: Insufficient documentation

## 2022-12-29 DIAGNOSIS — M5459 Other low back pain: Secondary | ICD-10-CM | POA: Insufficient documentation

## 2022-12-29 DIAGNOSIS — M542 Cervicalgia: Secondary | ICD-10-CM | POA: Insufficient documentation

## 2022-12-29 NOTE — Therapy (Signed)
OUTPATIENT PHYSICAL THERAPY TREATMENT NOTE   Patient Name: Monique [name] MRN: [id number] DOB:07-01-1940, 82 y.o., female Today's Date: 12/29/2022  END OF SESSION:  PT End of Session - 12/29/22 0830     Visit Number 3    Number of Visits 9    Date for PT Re-Evaluation 02/05/23    Authorization Type HTA    PT Start Time 0830    PT Stop Time 0910    PT Time Calculation (min) 40 min    Activity Tolerance Patient tolerated treatment well    Behavior During Therapy [facility] for tasks assessed/performed               Past Medical History:  Diagnosis Date   Allergy    Anemia    when had uterine fibroids   Anxiety    Arthritis    Cataract    very small   Cholelithiasis    Closed fracture of base of fifth metacarpal bone of left hand 06/01/2018   DJD (degenerative joint disease)    Dry eye syndrome    Headache(784.0)    Hyperlipidemia 01/20/2011   Hypertension 09/29/2006   Pinched nerve in neck    Substance abuse (HCC)    Thrombocytopenia (HCC) 08/27/2011   hematology evaluation 08-2011, return prn   Urticaria    Vasomotor rhinitis    Past Surgical History:  Procedure Laterality Date   ABDOMINAL HYSTERECTOMY     no oophorectomy   APPENDECTOMY     CHOLECYSTECTOMY N/A 06/06/2016   Procedure: LAPAROSCOPIC CHOLECYSTECTOMY WITH INTRAOPERATIVE CHOLANGIOGRAM;  Surgeon: Luretha Murphy, MD;  Location: [facility];  Service: General;  Laterality: N/A;   DILATION AND CURETTAGE OF UTERUS     EYE SURGERY Bilateral 05/06/2019   HERNIA REPAIR     mole removals     TONSILLECTOMY     UMBILICAL HERNIA REPAIR     Patient Active Problem List   Diagnosis Date Noted   Vasomotor rhinitis 04/11/2021   Urticaria 12/15/2019   MCI (mild cognitive impairment) 02/06/2017   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   GERD (gastroesophageal reflux disease) 06/05/2016   PCP NOTES >>>>>>>>>>>>>>>>>>>>>>>>> 02/04/2016   Severe allergic reaction 08/17/2014   Allergic rhinitis 12/01/2011   Thrombocytopenia  (HCC) 08/27/2011   Hyperlipidemia 01/20/2011   Annual physical exam 09/18/2010   Hyperglycemia 09/17/2009   Backache 07/10/2008   Anxiety and depression 01/19/2008   HX OF GALLSTONE 10/20/2006   Essential hypertension 09/29/2006    PCP: [name], MD  REFERRING PROVIDER: Rodolph Bong, MD  REFERRING DIAG:  M54.2 (ICD-10-CM) - Neck pain M54.16 (ICD-10-CM) - Lumbar radiculopathy  Rationale for Evaluation and Treatment: Rehabilitation  THERAPY DIAG:  Cervicalgia  Other low back pain  Muscle weakness (generalized)  ONSET DATE: Chronic  SUBJECTIVE:  SUBJECTIVE STATEMENT: Pt presents to PT with no reports of neck or LBP. Has been compliant with HEP with no adverse effect.   PERTINENT HISTORY:  HTN  PAIN:  Are you having pain?  Yes: NPRS scale: 5/10 Worst: 10/10 Pain location: neck, R upper trap Pain description: tight, sharp Aggravating factors: unsure Relieving factors: heat  Are you having pain?  No: NPRS scale: 0/10 Worst: 5/10 Pain location: lower back, L LE Pain description: tight, sharp Aggravating factors: unsure Relieving factors: heat  PRECAUTIONS: None  WEIGHT BEARING RESTRICTIONS: No  FALLS:  Has patient fallen in last 6 months? No  LIVING ENVIRONMENT: Lives with: lives alone Lives in: House/apartment Stairs: Yes: External: 4 steps; can reach both  Has following equipment at home: N/A  OCCUPATION: Retired  PLOF: Independent  PATIENT GOALS: decrease neck pain, improve comfort with ADLs  OBJECTIVE:   DIAGNOSTIC FINDINGS:  See imaging  PATIENT SURVEYS:  FOTO (neck): 53% function; 63% predicted FOTO (back): 53% function; 65% predicted  COGNITION: Overall cognitive status: Within functional limits for tasks assessed     SENSATION: WFL  POSTURE:  rounded shoulders and forward head  PALPATION: TTP to R upper trap, cervical paraspinals  CERVICAL ROM:   AROM eval  Flexion   Extension   Right lateral flexion   Left lateral flexion   Right rotation 35  Left rotation 50   (Blank rows = not tested)  LUMBAR SPECIAL TESTS:  Straight leg raise test: Negative and Slump test: Negative  FUNCTIONAL TESTS:  30 Second Sit to Stand: 15 reps  GAIT: Distance walked: 16ft Assistive device utilized: None Level of assistance: Complete Independence Comments: decrease gait speed  TREATMENT: [facility] Adult PT Treatment:                                                DATE: 12/29/2022 Therapeutic Exercise: NuStep lvl 5 UE/LE x 4 min while taking subjective Row 3x10 13# Shouler ext 2x10 13# Seated bilateral ER GTB 2x15 Supine horizontal abd 2x10 GTB Supine chin tuck 2x10 - 3" hold Manual Therapy: STM and trigger point release to R upper trap Suboccipital release Positional release to R upper trap Modalities: MHP to cervical spine post session x 10 min in supine  [facility] Adult PT Treatment:                                                DATE: 12/22/2022 Therapeutic Exercise: NuStep lvl 5 UE/LE x 4 min while taking subjective Row 3x10 GTB Shouler ext 2x10 GTB Seated bilateral ER GTB 2x10 Supine horizontal abd 2x10 GTB Supine chin tuck 2x10 - 5" hold Supine serratus lift 2x10 YTB  Manual Therapy: STM and trigger point release to R upper trap Suboccipital release Positional release to R upper trap Modalities: MHP to cervical spine post session x 10 min in supine  [facility] Adult PT Treatment:                                                DATE: 12/11/2022 Therapeutic Exercise: Row x 10 GTB Seated bilateral ER GTB x  5 Supine chin tuck x 5 - 5" hold Seated upper trap stretch x 30" R  PATIENT EDUCATION:  Education details: continue HEP Person educated: Patient Education method: Explanation, Demonstration, and Handouts Education  comprehension: verbalized understanding and returned demonstration  HOME EXERCISE PROGRAM: Access Code: [email or web address] URL: [email or web address] Date: 12/22/2022 Prepared by: Edwinna Areola  Exercises - Standing Shoulder Row with Anchored Resistance  - 1 x daily - 7 x weekly - 3 sets - 10 reps - green band hold - Shoulder External Rotation and Scapular Retraction with Resistance  - 1 x daily - 7 x weekly - 3 sets - 10 reps - green band hold - Supine Chin Tuck  - 1 x daily - 7 x weekly - 2 sets - 10 reps - 5 sec hold - Seated Upper Trapezius Stretch  - 1 x daily - 7 x weekly - 2 reps - 30 sec hold - Supine Shoulder Horizontal Abduction with Resistance  - 1 x daily - 7 x weekly - 2 sets - 10 reps - green band hold  ASSESSMENT:  CLINICAL IMPRESSION: Pt was able to compete prescribed exercises with no adverse effect. Therapy focused on DNF and periscapular strengthening to reduce neck pain and improve comfort. Responded well to manual therapy interventions, noting decreased pain post session. Pt progressing as expected, will continue per POC.  OBJECTIVE IMPAIRMENTS: decreased activity tolerance.   ACTIVITY LIMITATIONS: carrying, lifting, sleeping, and reach over head  PARTICIPATION LIMITATIONS: driving, shopping, community activity, and yard work  PERSONAL FACTORS: Time since onset of injury/illness/exacerbation and 1-2 comorbidities: HTN  are also affecting patient's functional outcome.   REHAB POTENTIAL: Excellent  CLINICAL DECISION MAKING: Stable/uncomplicated  EVALUATION COMPLEXITY: Low   GOALS: Goals reviewed with patient? No  SHORT TERM GOALS: Target date: 01/01/2023   Pt will be compliant and knowledgeable with initial HEP for improved comfort and carryover Baseline: initial HEP given  Goal status: INITIAL  2.  Pt will self report neck and low back pain no greater than 6/10 for improved comfort and functional ability Baseline: 10/10 at worst Goal status: INITIAL    LONG TERM GOALS: Target date: 02/05/2023   Pt will improve FOTO (neck) function score to no less than 63% as proxy for functional improvement Baseline: 53% function Goal status: INITIAL   2.  Pt will self report neck and low back pain no greater than 3/10 for improved comfort and functional ability Baseline: 10/10 at worst Goal status: INITIAL   3.  Pt will improve FOTO (lumbar) function score to no less than 65% as proxy for functional improvement Baseline: 53% function Goal status: INITIAL   4.  Pt will improve R cervical rotation to at least 50 degrees for improved comfort and ability with driving and home ADLs Baseline: see ROM chart  Goal status: INITIAL  PLAN:  PT FREQUENCY: 1x/week  PT DURATION: 8 weeks  PLANNED INTERVENTIONS: Therapeutic exercises, Therapeutic activity, Neuromuscular re-education, Balance training, Gait training, Patient/Family education, Self Care, Joint mobilization, Dry Needling, Electrical stimulation, Cryotherapy, Moist heat, Manual therapy, and Re-evaluation.  PLAN FOR NEXT SESSION: assess HEP response, TPDN and manual, DNF/core/periscapular strengthening   Eloy End, PT 12/29/2022, 9:13 AM

## 2023-01-05 ENCOUNTER — Ambulatory Visit: Payer: PPO

## 2023-01-05 ENCOUNTER — Ambulatory Visit (INDEPENDENT_AMBULATORY_CARE_PROVIDER_SITE_OTHER): Payer: PPO | Admitting: *Deleted

## 2023-01-05 DIAGNOSIS — Z0000 Encounter for general adult medical examination without abnormal findings: Secondary | ICD-10-CM

## 2023-01-05 NOTE — Progress Notes (Signed)
Subjective:   Monique [name] is a 82 y.o. female who presents for Medicare Annual (Subsequent) preventive examination.  Visit Complete: Virtual  I connected with  Eugenie Filler on 01/05/23 by a audio enabled telemedicine application and verified that I am speaking with the correct person using two identifiers.  Patient Location: Home  Provider Location: Office/Clinic  I discussed the limitations of evaluation and management by telemedicine. The patient expressed understanding and agreed to proceed.    Review of Systems     Cardiac Risk Factors include: advanced age (>16men, >33 women);dyslipidemia;hypertension     Objective:    Today's Vitals   There is no height or weight on file to calculate BMI.     01/05/2023    3:00 PM 12/11/2022    8:37 AM 11/28/2022   10:00 AM 02/21/2022    4:43 PM 01/28/2022    9:36 AM 01/02/2022    2:55 PM 12/27/2020    2:24 PM  Advanced Directives  Does Patient Have a Medical Advance Directive? Yes Yes Yes No Yes Yes Yes  Type of Estate agent of [place];Living will Healthcare Power of [place];Living will Healthcare Power of [place];Living will  Living will Healthcare Power of [place];Living will;Out of facility DNR (pink MOST or yellow form) Healthcare Power of [place];Living will  Does patient want to make changes to medical advance directive? No - Patient declined     No - Patient declined   Copy of Healthcare Power of Attorney in Chart? Yes - validated most recent copy scanned in chart (See row information) Yes - validated most recent copy scanned in chart (See row information) Yes - validated most recent copy scanned in chart (See row information)   No - copy requested No - copy requested  Would patient like information on creating a medical advance directive?    No - Patient declined       Current Medications (verified) Outpatient Encounter Medications as of 01/05/2023  Medication Sig   acetaminophen (TYLENOL) 500  MG tablet Take 500-1,000 mg by mouth daily as needed.   blood glucose meter kit and supplies Dispense based on patient and insurance preference. Check blood sugars no more than twice daily   brimonidine (ALPHAGAN) 0.2 % ophthalmic solution 1 drop 2 (two) times daily.   Calcium-Vitamin D (CALCIUM + D PO) Take 2 tablets by mouth daily with lunch. Calcium 600mg   Vitamin D 800 Units   carvedilol (COREG) 6.25 MG tablet Take 1 tablet (6.25 mg total) by mouth 2 (two) times daily with a meal.   cyclobenzaprine (FLEXERIL) 10 MG tablet Take 1 tablet (10 mg total) by mouth at bedtime as needed for muscle spasms.   cycloSPORINE (RESTASIS) 0.05 % ophthalmic emulsion Place 1 drop into both eyes 2 (two) times daily.   diclofenac Sodium (VOLTAREN) 1 % GEL Apply topically 4 (four) times daily. If needed   diphenhydrAMINE (BENADRYL) 12.5 MG/5ML liquid Take 25 mg by mouth every 6 (six) hours as needed for allergies. Reported on 08/06/2015   EPINEPHrine (EPIPEN 2-PAK) 0.3 mg/0.3 mL IJ SOAJ injection Inject 0.3 mLs (0.3 mg total) into the muscle once.   ezetimibe (ZETIA) 10 MG tablet Take 1 tablet (10 mg total) by mouth daily.   glucose blood (ONETOUCH ULTRA) test strip CHECK BLOOD SUGAR NO MORE THAN TWICE DAILY   Lancets (ONETOUCH DELICA PLUS LANCET33G) MISC CHECK BLOOD SUGAR NO MORE THAN TWICE DAILY   latanoprost (XALATAN) 0.005 % ophthalmic solution Place 1 drop into the  left eye at bedtime. Dr. Harriette Bouillon   Magnesium Oxide -Mg Supplement 250 MG TABS Take 1 tablet (250 mg total) by mouth daily.   meclizine (ANTIVERT) 25 MG tablet Take 25 mg by mouth daily as needed for dizziness.   mometasone (ELOCON) 0.1 % cream    ondansetron (ZOFRAN-ODT) 4 MG disintegrating tablet Take 4-8 mg by mouth every 4 (four) hours as needed.   Polyethyl Glycol-Propyl Glycol 0.4-0.3 % SOLN Place 1 drop into both eyes daily as needed (dry eyes). Systane   predniSONE (DELTASONE) 10 MG tablet 4 tablets x 2 days, 3 tabs x 2 days, 2 tabs x 2  days, 1 tab x 2 days   spironolactone (ALDACTONE) 25 MG tablet Take 0.5 tablets (12.5 mg total) by mouth daily.   triamcinolone cream (KENALOG) 0.1 %    vitamin C (ASCORBIC ACID) 500 MG tablet Take 500 mg by mouth daily.   gabapentin (NEURONTIN) 100 MG capsule Take 1-3 capsules (100-300 mg total) by mouth at bedtime as needed (nerve pain). (Patient not taking: Reported on 01/05/2023)   No facility-administered encounter medications on file as of 01/05/2023.    Allergies (verified) Bee venom, Doxycycline, Penicillins, Bee pollen, Azithromycin, Bactrim [sulfamethoxazole-trimethoprim], Cerumenex [trolamine (triethanolamine)], Climara [estradiol], Nickel, and Sertraline   History: Past Medical History:  Diagnosis Date   Allergy    Anemia    when had uterine fibroids   Anxiety    Arthritis    Cataract    very small   Cholelithiasis    Closed fracture of base of fifth metacarpal bone of left hand 06/01/2018   DJD (degenerative joint disease)    Dry eye syndrome    Headache(784.0)    Hyperlipidemia 01/20/2011   Hypertension 09/29/2006   Pinched nerve in neck    Substance abuse (HCC)    Thrombocytopenia (HCC) 08/27/2011   hematology evaluation 08-2011, return prn   Urticaria    Vasomotor rhinitis    Past Surgical History:  Procedure Laterality Date   ABDOMINAL HYSTERECTOMY     no oophorectomy   APPENDECTOMY     CHOLECYSTECTOMY N/A 06/06/2016   Procedure: LAPAROSCOPIC CHOLECYSTECTOMY WITH INTRAOPERATIVE CHOLANGIOGRAM;  Surgeon: Luretha Murphy, MD;  Location: [facility];  Service: General;  Laterality: N/A;   DILATION AND CURETTAGE OF UTERUS     EYE SURGERY Bilateral 05/06/2019   HERNIA REPAIR     mole removals     TONSILLECTOMY     UMBILICAL HERNIA REPAIR     Family History  Problem Relation Age of Onset   Coronary artery disease Father    Cancer Father        unsure of type   Other Father        Guillain- barre syndrome   Diabetes Other        uncles    Hypertension Mother     Anxiety disorder Mother    Depression Mother    Prostate cancer Other        grandfather   Breast cancer Sister 67   Depression Maternal Uncle    Colon cancer Neg Hx    Colon polyps Neg Hx    Rectal cancer Neg Hx    Stomach cancer Neg Hx    Social History   Socioeconomic History   Marital status: Widowed    Spouse name: Not on file   Number of children: 2   Years of education: Not on file   Highest education level: Not on file  Occupational History   Occupation: retired  pastor     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, environmental  Tobacco Use   Smoking status: Never   Smokeless tobacco: Never  Vaping Use   Vaping Use: Never used  Substance and Sexual Activity   Alcohol use: No   Drug use: No   Sexual activity: Not Currently    Birth control/protection: Surgical  Other Topics Concern   Not on file  Social History Narrative   Lost her mother at 65 (april 2021)   husband had prostate ca , passed away 10-04-2013     Lives by herself   Son Linna Darner)  lives in [place] , [place]   Son lives in [place], had astroke   Drives as of 12-2021   Social Determinants of Health   Financial Resource Strain: Low Risk  (01/05/2023)   Overall Financial Resource Strain (CARDIA)    Difficulty of Paying Living Expenses: Not very hard  Food Insecurity: No Food Insecurity (01/05/2023)   Hunger Vital Sign    Worried About Running Out of Food in the Last Year: Never true    Ran Out of Food in the Last Year: Never true  Transportation Needs: No Transportation Needs (01/05/2023)   PRAPARE - Administrator, Civil Service (Medical): No    Lack of Transportation (Non-Medical): No  Physical Activity: Insufficiently Active (01/05/2023)   Exercise Vital Sign    Days of Exercise per Week: 2 days    Minutes of Exercise per Session: 50 min  Stress: No Stress Concern Present (01/05/2023)   [employer] of Occupational Health - Occupational Stress Questionnaire    Feeling of Stress : Not at all  Social Connections:  Moderately Integrated (01/05/2023)   Social Connection and Isolation Panel [NHANES]    Frequency of Communication with Friends and Family: More than three times a week    Frequency of Social Gatherings with Friends and Family: More than three times a week    Attends Religious Services: More than 4 times per year    Active Member of [employer] or Organizations: Yes    Attends Banker Meetings: More than 4 times per year    Marital Status: Widowed    Tobacco Counseling Counseling given: Not Answered   Clinical Intake:  Pre-visit preparation completed: Yes  Pain : No/denies pain  Nutritional Risks: None Diabetes: No  How often do you need to have someone help you when you read instructions, pamphlets, or other written materials from your doctor or pharmacy?: 1 - Never  Interpreter Needed?: No  Information entered by :: Donne Anon, CMA   Activities of Daily Living    01/05/2023    3:03 PM  In your present state of health, do you have any difficulty performing the following activities:  Hearing? 0  Vision? 0  Difficulty concentrating or making decisions? 0  Walking or climbing stairs? 0  Dressing or bathing? 0  Doing errands, shopping? 0  Preparing Food and eating ? N  Using the Toilet? N  In the past six months, have you accidently leaked urine? N  Do you have problems with loss of bowel control? N  Managing your Medications? N  Managing your Finances? N  Housekeeping or managing your Housekeeping? N    Patient Care Team: [name], MD as PCP - General [place]ers, [place] as Referring Physician (Optometry) Sidney Ace, MD as Referring Physician (Allergy) Essie Hart, MD (Inactive) as Referring Physician (Obstetrics and Gynecology) Rhea Belton, Carie Caddy, MD as Consulting Physician (  Gastroenterology) Archer Asa, MD as Consulting Physician (Psychiatry) [place], Elvin So, MD as Referring Physician (Dermatology) [facility] (Orthopedic  Surgery) Henrene Pastor, RPH-CPP (Pharmacist)  Indicate any recent Medical Services you may have received from other than [facility] providers in the past year (date may be approximate).     Assessment:   This is a routine wellness examination for [place].  Hearing/Vision screen No results found.  Dietary issues and exercise activities discussed:     Goals Addressed   None    Depression Screen    01/05/2023    3:23 PM 11/28/2022    9:32 AM 11/10/2022    8:50 AM 05/12/2022    8:33 AM 04/01/2022    3:19 PM 01/03/2022    3:58 PM 01/02/2022    2:58 PM  PHQ 2/9 Scores  PHQ - 2 Score 0 0 0 0 0 0 0  PHQ- 9 Score  0 0 0  0     Fall Risk    01/05/2023    3:23 PM 11/28/2022    9:32 AM 11/10/2022    8:33 AM 05/12/2022    8:40 AM 04/01/2022    3:19 PM  Fall Risk   Falls in the past year? 0 0 0 0 0  Number falls in past yr: 0 0 0 0 0  Injury with Fall? 0 0 0 0 0  Risk for fall due to : No Fall Risks      Follow up Falls evaluation completed Falls evaluation completed Falls evaluation completed Falls evaluation completed Falls evaluation completed    MEDICARE RISK AT HOME:  Medicare Risk at Home - 01/05/23 1510     Any stairs in or around the home? Yes    If so, are there any without handrails? No    Home free of loose throw rugs in walkways, pet beds, electrical cords, etc? Yes    Adequate lighting in your home to reduce risk of falls? Yes    Life alert? Yes    Use of a cane, walker or w/c? No    Grab bars in the bathroom? Yes    Shower chair or bench in shower? Yes    Elevated toilet seat or a handicapped toilet? Yes             TIMED UP AND GO:  Was the test performed?  No    Cognitive Function:    08/17/2017   11:35 AM  MMSE - Mini Mental State Exam  Orientation to time 5  Orientation to Place 5  Registration 3  Attention/ Calculation 5  Recall 2  Language- name 2 objects 2  Language- repeat 1  Language- follow 3 step command 3  Language- read & follow direction 1   Write a sentence 1  Copy design 1  Total score 29        01/05/2023    3:26 PM 01/02/2022    3:10 PM  6CIT Screen  What Year? 0 points 0 points  What month? 0 points   What time? 0 points 0 points  Count back from 20 0 points 0 points  Months in reverse 0 points 0 points  Repeat phrase 0 points 0 points  Total Score 0 points     Immunizations Immunization History  Administered Date(s) Administered   Fluad Quad(high Dose 65+) 03/21/2019, 03/25/2021   Influenza Split 05/02/2014   Influenza Whole 05/14/2007, 04/09/2009, 04/03/2010   Influenza, High Dose Seasonal PF 04/06/2015, 09/21/2015, 09/19/2016, 09/21/2017, 04/08/2018, 02/24/2020, 11/09/2020, 02/19/2022  Influenza, Seasonal, Injecte, Preservative Fre 04/12/2013   Influenza-Unspecified 08/17/2017, 04/09/2021   PFIZER Comirnaty(Gray Top)Covid-19 Tri-Sucrose Vaccine 10/31/2020   PFIZER(Purple Top)SARS-COV-2 Vaccination 09/16/2019, 10/11/2019, 11/11/2019, 05/05/2020   Pfizer Covid-19 Vaccine Bivalent Booster 45yrs & up 05/02/2021, 04/30/2022   Pneumococcal Conjugate-13 11/30/2013   Pneumococcal Polysaccharide-23 06/30/2001, 05/14/2007, 04/25/2015, 09/21/2015, 09/19/2016, 09/21/2017, 09/08/2018, 11/09/2018, 11/09/2020, 01/27/2022   Respiratory Syncytial Virus Vaccine,Recomb Aduvanted(Arexvy) 03/31/2022   Td 10/08/2004   Tdap 04/12/2013, 02/19/2022   Zoster Recombinant(Shingrix) 03/11/2018, 05/20/2018   Zoster, Live 06/30/2005    TDAP status: Up to date  Flu Vaccine status: Up to date  Pneumococcal vaccine status: Up to date  Covid-19 vaccine status: Information provided on how to obtain vaccines.   Qualifies for Shingles Vaccine? Yes   Zostavax completed Yes   Shingrix Completed?: Yes  Screening Tests Health Maintenance  Topic Date Due   Medicare Annual Wellness (AWV)  01/03/2023   COVID-19 Vaccine (8 - 2023-24 season) 05/10/2023 (Originally 06/25/2022)   INFLUENZA VACCINE  01/29/2023   DTaP/Tdap/Td (4 - Td or  Tdap) 02/20/2032   Pneumonia Vaccine 71+ Years old  Completed   DEXA SCAN  Completed   Zoster Vaccines- Shingrix  Completed   HPV VACCINES  Aged Out   Hepatitis C Screening  Discontinued    Health Maintenance  Health Maintenance Due  Topic Date Due   Medicare Annual Wellness (AWV)  01/03/2023    Colorectal cancer screening: No longer required.   Mammogram status: Completed 03/31/22. Repeat every year  Bone Density status: Completed 03/25/21. Results reflect: Bone density results: NORMAL. Repeat every 2 years.  Lung Cancer Screening: (Low Dose CT Chest recommended if Age 72-80 years, 20 pack-year currently smoking OR have quit w/in 15years.) does not qualify.   Additional Screening:  Hepatitis C Screening: does not qualify  Vision Screening: Recommended annual ophthalmology exams for early detection of glaucoma and other disorders of the eye. Is the patient up to date with their annual eye exam?  Yes  Who is the provider or what is the name of the office in which the patient attends annual eye exams?  Dr. Harriette Bouillon If pt is not established with a provider, would they like to be referred to a provider to establish care? No .   Dental Screening: Recommended annual dental exams for proper oral hygiene  Diabetic Foot Exam: N/a  Community Resource Referral / Chronic Care Management: CRR required this visit?  No   CCM required this visit?  No     Plan:     I have personally reviewed and noted the following in the patient's chart:   Medical and social history Use of alcohol, tobacco or illicit drugs  Current medications and supplements including opioid prescriptions. Patient is not currently taking opioid prescriptions. Functional ability and status Nutritional status Physical activity Advanced directives List of other physicians Hospitalizations, surgeries, and ER visits in previous 12 months Vitals Screenings to include cognitive, depression, and falls Referrals  and appointments  In addition, I have reviewed and discussed with patient certain preventive protocols, quality metrics, and best practice recommendations. A written personalized care plan for preventive services as well as general preventive health recommendations were provided to patient.     Donne Anon, CMA   01/05/2023   After Visit Summary: (Declined) Due to this being a telephonic visit, with patients personalized plan was offered to patient but patient Declined AVS at this time   Nurse Notes: None

## 2023-01-05 NOTE — Patient Instructions (Signed)
Monique [name] , Thank you for taking time to come for your Medicare Wellness Visit. I appreciate your ongoing commitment to your health goals. Please review the following plan we discussed and let me know if I can assist you in the future.     This is a list of the screening recommended for you and due dates:  Health Maintenance  Topic Date Due   COVID-19 Vaccine (8 - 2023-24 season) 05/10/2023*   Flu Shot  01/29/2023   Medicare Annual Wellness Visit  01/05/2024   DTaP/Tdap/Td vaccine (4 - Td or Tdap) 02/20/2032   Pneumonia Vaccine  Completed   DEXA scan (bone density measurement)  Completed   Zoster (Shingles) Vaccine  Completed   HPV Vaccine  Aged Out   Hepatitis C Screening  Discontinued  *Topic was postponed. The date shown is not the original due date.    Next appointment: Follow up in one year for your annual wellness visit.   Preventive Care 22 Years and Older, Female Preventive care refers to lifestyle choices and visits with your health care provider that can promote health and wellness. What does preventive care include? A yearly physical exam. This is also called an annual well check. Dental exams once or twice a year. Routine eye exams. Ask your health care provider how often you should have your eyes checked. Personal lifestyle choices, including: Daily care of your teeth and gums. Regular physical activity. Eating a healthy diet. Avoiding tobacco and drug use. Limiting alcohol use. Practicing safe sex. Taking low-dose aspirin every day. Taking vitamin and mineral supplements as recommended by your health care provider. What happens during an annual well check? The services and screenings done by your health care provider during your annual well check will depend on your age, overall health, lifestyle risk factors, and family history of disease. Counseling  Your health care provider may ask you questions about your: Alcohol use. Tobacco use. Drug  use. Emotional well-being. Home and relationship well-being. Sexual activity. Eating habits. History of falls. Memory and ability to understand (cognition). Work and work Astronomer. Reproductive health. Screening  You may have the following tests or measurements: Height, weight, and BMI. Blood pressure. Lipid and cholesterol levels. These may be checked every 5 years, or more frequently if you are over 67 years old. Skin check. Lung cancer screening. You may have this screening every year starting at age 58 if you have a 30-pack-year history of smoking and currently smoke or have quit within the past 15 years. Fecal occult blood test (FOBT) of the stool. You may have this test every year starting at age 31. Flexible sigmoidoscopy or colonoscopy. You may have a sigmoidoscopy every 5 years or a colonoscopy every 10 years starting at age 12. Hepatitis C blood test. Hepatitis B blood test. Sexually transmitted disease (STD) testing. Diabetes screening. This is done by checking your blood sugar (glucose) after you have not eaten for a while (fasting). You may have this done every 1-3 years. Bone density scan. This is done to screen for osteoporosis. You may have this done starting at age 60. Mammogram. This may be done every 1-2 years. Talk to your health care provider about how often you should have regular mammograms. Talk with your health care provider about your test results, treatment options, and if necessary, the need for more tests. Vaccines  Your health care provider may recommend certain vaccines, such as: Influenza vaccine. This is recommended every year. Tetanus, diphtheria, and acellular pertussis (Tdap, Td)  vaccine. You may need a Td booster every 10 years. Zoster vaccine. You may need this after age 38. Pneumococcal 13-valent conjugate (PCV13) vaccine. One dose is recommended after age 55. Pneumococcal polysaccharide (PPSV23) vaccine. One dose is recommended after age  67. Talk to your health care provider about which screenings and vaccines you need and how often you need them. This information is not intended to replace advice given to you by your health care provider. Make sure you discuss any questions you have with your health care provider. Document Released: 07/13/2015 Document Revised: 03/05/2016 Document Reviewed: 04/17/2015 Elsevier Interactive Patient Education  2017 [employer].  Fall Prevention in the Home Falls can cause injuries. They can happen to people of all ages. There are many things you can do to make your home safe and to help prevent falls. What can I do on the outside of my home? Regularly fix the edges of walkways and driveways and fix any cracks. Remove anything that might make you trip as you walk through a door, such as a raised step or threshold. Trim any bushes or trees on the path to your home. Use bright outdoor lighting. Clear any walking paths of anything that might make someone trip, such as rocks or tools. Regularly check to see if handrails are loose or broken. Make sure that both sides of any steps have handrails. Any raised decks and porches should have guardrails on the edges. Have any leaves, snow, or ice cleared regularly. Use sand or salt on walking paths during winter. Clean up any spills in your garage right away. This includes oil or grease spills. What can I do in the bathroom? Use night lights. Install grab bars by the toilet and in the tub and shower. Do not use towel bars as grab bars. Use non-skid mats or decals in the tub or shower. If you need to sit down in the shower, use a plastic, non-slip stool. Keep the floor dry. Clean up any water that spills on the floor as soon as it happens. Remove soap buildup in the tub or shower regularly. Attach bath mats securely with double-sided non-slip rug tape. Do not have throw rugs and other things on the floor that can make you trip. What can I do in the  bedroom? Use night lights. Make sure that you have a light by your bed that is easy to reach. Do not use any sheets or blankets that are too big for your bed. They should not hang down onto the floor. Have a firm chair that has side arms. You can use this for support while you get dressed. Do not have throw rugs and other things on the floor that can make you trip. What can I do in the kitchen? Clean up any spills right away. Avoid walking on wet floors. Keep items that you use a lot in easy-to-reach places. If you need to reach something above you, use a strong step stool that has a grab bar. Keep electrical cords out of the way. Do not use floor polish or wax that makes floors slippery. If you must use wax, use non-skid floor wax. Do not have throw rugs and other things on the floor that can make you trip. What can I do with my stairs? Do not leave any items on the stairs. Make sure that there are handrails on both sides of the stairs and use them. Fix handrails that are broken or loose. Make sure that handrails are as long  as the stairways. Check any carpeting to make sure that it is firmly attached to the stairs. Fix any carpet that is loose or worn. Avoid having throw rugs at the top or bottom of the stairs. If you do have throw rugs, attach them to the floor with carpet tape. Make sure that you have a light switch at the top of the stairs and the bottom of the stairs. If you do not have them, ask someone to add them for you. What else can I do to help prevent falls? Wear shoes that: Do not have high heels. Have rubber bottoms. Are comfortable and fit you well. Are closed at the toe. Do not wear sandals. If you use a stepladder: Make sure that it is fully opened. Do not climb a closed stepladder. Make sure that both sides of the stepladder are locked into place. Ask someone to hold it for you, if possible. Clearly mark and make sure that you can see: Any grab bars or  handrails. First and last steps. Where the edge of each step is. Use tools that help you move around (mobility aids) if they are needed. These include: Canes. Walkers. Scooters. Crutches. Turn on the lights when you go into a dark area. Replace any light bulbs as soon as they burn out. Set up your furniture so you have a clear path. Avoid moving your furniture around. If any of your floors are uneven, fix them. If there are any pets around you, be aware of where they are. Review your medicines with your doctor. Some medicines can make you feel dizzy. This can increase your chance of falling. Ask your doctor what other things that you can do to help prevent falls. This information is not intended to replace advice given to you by your health care provider. Make sure you discuss any questions you have with your health care provider. Document Released: 04/12/2009 Document Revised: 11/22/2015 Document Reviewed: 07/21/2014 Elsevier Interactive Patient Education  2017 [employer].

## 2023-01-06 LAB — HM DIABETES EYE EXAM

## 2023-01-12 ENCOUNTER — Encounter: Payer: Self-pay | Admitting: Internal Medicine

## 2023-01-12 ENCOUNTER — Ambulatory Visit: Payer: PPO | Admitting: Internal Medicine

## 2023-01-12 VITALS — BP 118/76 | HR 84 | Temp 98.1°F | Resp 16 | Ht 59.0 in | Wt 154.0 lb

## 2023-01-12 DIAGNOSIS — Z0000 Encounter for general adult medical examination without abnormal findings: Secondary | ICD-10-CM

## 2023-01-12 DIAGNOSIS — I10 Essential (primary) hypertension: Secondary | ICD-10-CM | POA: Diagnosis not present

## 2023-01-12 DIAGNOSIS — E785 Hyperlipidemia, unspecified: Secondary | ICD-10-CM | POA: Diagnosis not present

## 2023-01-12 DIAGNOSIS — R739 Hyperglycemia, unspecified: Secondary | ICD-10-CM

## 2023-01-12 NOTE — Progress Notes (Unsigned)
Subjective:    Patient ID: Monique [name], female    DOB: 1940/09/21, 82 y.o.   MRN: [id number]  DOS:  01/12/2023 Type of visit - description: CPX  Here for CPX. In general she feels well, has no specific concerns for me. Still have some MSK issues, sees a sports medicine.  Review of Systems See above   Past Medical History:  Diagnosis Date   Allergy    Anemia    when had uterine fibroids   Anxiety    Arthritis    Cataract    very small   Cholelithiasis    Closed fracture of base of fifth metacarpal bone of left hand 06/01/2018   DJD (degenerative joint disease)    Dry eye syndrome    Headache(784.0)    Hyperlipidemia 01/20/2011   Hypertension 09/29/2006   Pinched nerve in neck    Substance abuse (HCC)    Thrombocytopenia (HCC) 08/27/2011   hematology evaluation 08-2011, return prn   Urticaria    Vasomotor rhinitis     Past Surgical History:  Procedure Laterality Date   ABDOMINAL HYSTERECTOMY     no oophorectomy   APPENDECTOMY     CHOLECYSTECTOMY N/A 06/06/2016   Procedure: LAPAROSCOPIC CHOLECYSTECTOMY WITH INTRAOPERATIVE CHOLANGIOGRAM;  Surgeon: Luretha Murphy, MD;  Location: [facility];  Service: General;  Laterality: N/A;   DILATION AND CURETTAGE OF UTERUS     EYE SURGERY Bilateral 05/06/2019   HERNIA REPAIR     mole removals     TONSILLECTOMY     UMBILICAL HERNIA REPAIR      Current Outpatient Medications  Medication Instructions   acetaminophen (TYLENOL) 500-1,000 mg, Oral, Daily PRN   ascorbic acid (VITAMIN C) 500 mg, Oral, Daily   blood glucose meter kit and supplies Dispense based on patient and insurance preference. Check blood sugars no more than twice daily   brimonidine (ALPHAGAN) 0.2 % ophthalmic solution 1 drop, 2 times daily   Calcium-Vitamin D (CALCIUM + D PO) 2 tablets, Oral, Daily with lunch, Calcium 600mg  <BR>Vitamin D 800 Units   carvedilol (COREG) 6.25 mg, Oral, 2 times daily with meals   cyclobenzaprine (FLEXERIL) 10 mg, Oral, At bedtime  PRN   cycloSPORINE (RESTASIS) 0.05 % ophthalmic emulsion 1 drop, Both Eyes, 2 times daily   diclofenac Sodium (VOLTAREN) 1 % GEL Topical, 4 times daily, If needed   diphenhydrAMINE (BENADRYL) 25 mg, Oral, Every 6 hours PRN, Reported on 08/06/2015   EPINEPHrine (EPIPEN 2-PAK) 0.3 mg, Intramuscular,  Once   ezetimibe (ZETIA) 10 mg, Oral, Daily   gabapentin (NEURONTIN) 100-300 mg, Oral, At bedtime PRN   glucose blood (ONETOUCH ULTRA) test strip CHECK BLOOD SUGAR NO MORE THAN TWICE DAILY   Lancets (ONETOUCH DELICA PLUS LANCET33G) MISC CHECK BLOOD SUGAR NO MORE THAN TWICE DAILY   latanoprost (XALATAN) 0.005 % ophthalmic solution 1 drop, Left Eye, Daily at bedtime, Dr. Harriette Bouillon    Magnesium Oxide -Mg Supplement 250 mg, Oral, Daily   meclizine (ANTIVERT) 25 mg, Oral, Daily PRN   mometasone (ELOCON) 0.1 % cream    ondansetron (ZOFRAN-ODT) 4-8 mg, Every 4 hours PRN   Polyethyl Glycol-Propyl Glycol 0.4-0.3 % SOLN 1 drop, Both Eyes, Daily PRN, Systane   predniSONE (DELTASONE) 10 MG tablet 4 tablets x 2 days, 3 tabs x 2 days, 2 tabs x 2 days, 1 tab x 2 days   spironolactone (ALDACTONE) 12.5 mg, Oral, Daily   triamcinolone cream (KENALOG) 0.1 %        Objective:  Physical Exam BP 118/76   Pulse 84   Temp 98.1 F (36.7 C) (Oral)   Resp 16   Ht 4\' 11"  (1.499 m)   Wt 154 lb (69.9 kg)   SpO2 97%   BMI 31.10 kg/m  General: Well developed, NAD, BMI noted Neck: No  thyromegaly  HEENT:  Normocephalic . Face symmetric, atraumatic Lungs:  CTA B Normal respiratory effort, no intercostal retractions, no accessory muscle use. Heart: RRR,  no murmur.  Abdomen:  Not distended, soft, non-tender. No rebound or rigidity.   Lower extremities: no pretibial edema bilaterally  Skin: Exposed areas without rash. Not pale. Not jaundice Neurologic:  alert & oriented X3.  Speech normal, gait appropriate for age and unassisted Strength symmetric and appropriate for age.  Psych: Cognition and judgment  appear intact.  Cooperative with normal attention span and concentration.  Behavior appropriate. No anxious or depressed appearing.     Assessment     ASSESSMENT Prediabetes HTN Hyperlipidemia (2021: Off Pravachol as recommended by her allergist due to urticaria thus  I'm reluctant to rx statins) Thrombocytopenia, hematology eval 2013, RTC PRN Allergic rhinitis, urticaria, severe allergic reaction with hives in 2016.-- Dr 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 -  pneumonia shot  2003, 2008, 2020, 2022 ;  prevnar 2015 -  zostavax 2007; s/p  shingrex  X 2 ; s/p RSV - Vaccines I recommend: Flu shot every fall, COVID booster. -Female care :  +FH breast ca, MMG 03-2022 [facility]  History of hysterectomy for a benign condition, no abnormal Pap smears. Saw gynecology 01-2022 -DEXA 10-2013 and 2019:  normal , on Ca and Vit D    -CCS: Cscope 10-2005 (-) and 02-2016  @ [place], next  5-10 years but optional per GI letter.  Shared decision:no further screenings  -Safety discussed: no recent falls, prevention discussed  -Labs: BMP magnesium FLP A1c - ACP  - charted  Prediabetes: She is trying to remain active, check A1c HTN: On carvedilol, Aldactone, she also take magnesium.  Recheck a BMP. Hypomagnesemia: History of, still taking supplements, checking labs. Hyperlipidemia: On Zetia, off statins because at some point Pravachol was limited to a urticaria event. Check FLP. Social: Lives by herself, still drives, seems to be doing well. RTC 6 months

## 2023-01-12 NOTE — Patient Instructions (Signed)
Vaccines I recommend: Booster Flu shot every fall   GO TO THE LAB : Get the blood work     GO TO THE FRONT DESK, PLEASE SCHEDULE YOUR APPOINTMENTS Come back for a checkup in 6 months   Fall Prevention in the Home, Adult Falls can cause injuries and affect people of all ages. There are many simple things that you can do to make your home safe and to help prevent falls. If you need it, ask for help making these changes. What actions can I take to prevent falls? General information Use good lighting in all rooms. Make sure to: Replace any light bulbs that burn out. Turn on lights if it is dark and use night-lights. Keep items that you use often in easy-to-reach places. Lower the shelves around your home if needed. Move furniture so that there are clear paths around it. Do not keep throw rugs or other things on the floor that can make you trip. If any of your floors are uneven, fix them. Add color or contrast paint or tape to clearly mark and help you see: Grab bars or handrails. First and last steps of staircases. Where the edge of each step is. If you use a ladder or stepladder: Make sure that it is fully opened. Do not climb a closed ladder. Make sure the sides of the ladder are locked in place. Have someone hold the ladder while you use it. Know where your pets are as you move through your home. What can I do in the bathroom?     Keep the floor dry. Clean up any water that is on the floor right away. Remove soap buildup in the bathtub or shower. Buildup makes bathtubs and showers slippery. Use non-skid mats or decals on the floor of the bathtub or shower. Attach bath mats securely with double-sided, non-slip rug tape. If you need to sit down while you are in the shower, use a non-slip stool. Install grab bars by the toilet and in the bathtub and shower. Do not use towel bars as grab bars. What can I do in the bedroom? Make sure that you have a light by your bed that is  easy to reach. Do not use any sheets or blankets on your bed that hang to the floor. Have a firm bench or chair with side arms that you can use for support when you get dressed. What can I do in the kitchen? Clean up any spills right away. If you need to reach something above you, use a sturdy step stool that has a grab bar. Keep electrical cables out of the way. Do not use floor polish or wax that makes floors slippery. What can I do with my stairs? Do not leave anything on the stairs. Make sure that you have a light switch at the top and the bottom of the stairs. Have them installed if you do not have them. Make sure that there are handrails on both sides of the stairs. Fix handrails that are broken or loose. Make sure that handrails are as long as the staircases. Install non-slip stair treads on all stairs in your home if they do not have carpet. Avoid having throw rugs at the top or bottom of stairs, or secure the rugs with carpet tape to prevent them from moving. Choose a carpet design that does not hide the edge of steps on the stairs. Make sure that carpet is firmly attached to the stairs. Fix any carpet that is loose  or worn. What can I do on the outside of my home? Use bright outdoor lighting. Repair the edges of walkways and driveways and fix any cracks. Clear paths of anything that can make you trip, such as tools or rocks. Add color or contrast paint or tape to clearly mark and help you see high doorway thresholds. Trim any bushes or trees on the main path into your home. Check that handrails are securely fastened and in good repair. Both sides of all steps should have handrails. Install guardrails along the edges of any raised decks or porches. Have leaves, snow, and ice cleared regularly. Use sand, salt, or ice melt on walkways during winter months if you live where there is ice and snow. In the garage, clean up any spills right away, including grease or oil spills. What other  actions can I take? Review your medicines with your health care provider. Some medicines can make you confused or feel dizzy. This can increase your chance of falling. Wear closed-toe shoes that fit well and support your feet. Wear shoes that have rubber soles and low heels. Use a cane, walker, scooter, or crutches that help you move around if needed. Talk with your provider about other ways that you can decrease your risk of falls. This may include seeing a physical therapist to learn to do exercises to improve movement and strength. Where to find more information Centers for Disease Control and Prevention, [employer]: TonerPromos.no [employer] on Aging: BaseRingTones.pl National Institute on Aging: BaseRingTones.pl Contact a health care provider if: You are afraid of falling at home. You feel weak, drowsy, or dizzy at home. You fall at home. Get help right away if you: Lose consciousness or have trouble moving after a fall. Have a fall that causes a head injury. These symptoms may be an emergency. Get help right away. Call 911. Do not wait to see if the symptoms will go away. Do not drive yourself to the hospital. This information is not intended to replace advice given to you by your health care provider. Make sure you discuss any questions you have with your health care provider. Document Revised: 02/17/2022 Document Reviewed: 02/17/2022 Elsevier Patient Education  2024 [employer].

## 2023-01-13 ENCOUNTER — Encounter: Payer: Self-pay | Admitting: Internal Medicine

## 2023-01-13 LAB — LIPID PANEL
Cholesterol: 151 mg/dL (ref 0–200)
HDL: 55.3 mg/dL (ref >39.00)
LDL Cholesterol: 68 mg/dL (ref 0–99)
NonHDL: 95.89
Total CHOL/HDL Ratio: 3
Triglycerides: 138 mg/dL (ref 0.0–149.0)
VLDL: 27.6 mg/dL (ref 0.0–40.0)

## 2023-01-13 LAB — BASIC METABOLIC PANEL
BUN: 14 mg/dL (ref 6–23)
CO2: 30 mEq/L (ref 19–32)
Calcium: 10.2 mg/dL (ref 8.4–10.5)
Chloride: 104 mEq/L (ref 96–112)
Creatinine, Ser: 1.05 mg/dL (ref 0.40–1.20)
GFR: 49.48 mL/min — ABNORMAL LOW (ref >60.00)
Glucose, Bld: 81 mg/dL (ref 70–99)
Potassium: 5.1 mEq/L (ref 3.5–5.1)
Sodium: 139 mEq/L (ref 135–145)

## 2023-01-13 LAB — MAGNESIUM: Magnesium: 1.6 mg/dL (ref 1.5–2.5)

## 2023-01-13 LAB — HEMOGLOBIN A1C: Hgb A1c MFr Bld: 6 % (ref 4.6–6.5)

## 2023-01-13 NOTE — Assessment & Plan Note (Addendum)
Here for CPX Prediabetes: She is trying to remain active, check A1c HTN: On carvedilol, Aldactone, she also take magnesium.  Recheck a BMP. Hypomagnesemia: History of, still taking supplements, checking labs. Hyperlipidemia: On Zetia, off statins because at some point Pravachol was felt to be linked w/  urticaria. Check FLP. Social: Lives by herself, still drives, seems to be doing well. RTC 6 months

## 2023-01-13 NOTE — Assessment & Plan Note (Signed)
-   Td 2014  . -  pneumonia shot  2003, 2008, 2020, 2022 ;  prevnar 2015 -  zostavax 2007; s/p  shingrex  X 2 ; s/p RSV - Vaccines I recommend: Flu shot every fall, COVID booster. -Female care :  +FH breast ca, MMG 03-2022 [facility]  History of hysterectomy for a benign condition, no abnormal Pap smears. Saw gynecology 01-2022 -DEXA 10-2013 and 2019:  normal; per literature next dexa in  ~ 5 to 10 years , on Ca and Vit D    -CCS: Cscope 10-2005 (-) and 02-2016  @ [place].  Shared decision:no further screenings  -Safety discussed: no recent falls, prevention discussed  -Labs: BMP magnesium FLP A1c - ACP  - charted

## 2023-01-14 ENCOUNTER — Ambulatory Visit: Payer: PPO

## 2023-01-14 DIAGNOSIS — M5459 Other low back pain: Secondary | ICD-10-CM

## 2023-01-14 DIAGNOSIS — M542 Cervicalgia: Secondary | ICD-10-CM | POA: Diagnosis not present

## 2023-01-14 DIAGNOSIS — M6281 Muscle weakness (generalized): Secondary | ICD-10-CM

## 2023-01-14 NOTE — Therapy (Signed)
OUTPATIENT PHYSICAL THERAPY TREATMENT NOTE   Patient Name: Monique [name] MRN: [id number] DOB:1940/10/11, 82 y.o., female Today's Date: 01/14/2023  END OF SESSION:  PT End of Session - 01/14/23 1353     Visit Number 4    Number of Visits 9    Date for PT Re-Evaluation 02/05/23    Authorization Type HTA    PT Start Time 1400    PT Stop Time 1439    PT Time Calculation (min) 39 min    Activity Tolerance Patient tolerated treatment well    Behavior During Therapy WFL for tasks assessed/performed                Past Medical History:  Diagnosis Date   Allergy    Anemia    when had uterine fibroids   Anxiety    Arthritis    Cataract    very small   Cholelithiasis    Closed fracture of base of fifth metacarpal bone of left hand 06/01/2018   DJD (degenerative joint disease)    Dry eye syndrome    Headache(784.0)    Hyperlipidemia 01/20/2011   Hypertension 09/29/2006   Pinched nerve in neck    Substance abuse (HCC)    Thrombocytopenia (HCC) 08/27/2011   hematology evaluation 08-2011, return prn   Urticaria    Vasomotor rhinitis    Past Surgical History:  Procedure Laterality Date   ABDOMINAL HYSTERECTOMY     no oophorectomy   APPENDECTOMY     CHOLECYSTECTOMY N/A 06/06/2016   Procedure: LAPAROSCOPIC CHOLECYSTECTOMY WITH INTRAOPERATIVE CHOLANGIOGRAM;  Surgeon: Luretha Murphy, MD;  Location: [facility];  Service: General;  Laterality: N/A;   DILATION AND CURETTAGE OF UTERUS     EYE SURGERY Bilateral 05/06/2019   HERNIA REPAIR     mole removals     TONSILLECTOMY     UMBILICAL HERNIA REPAIR     Patient Active Problem List   Diagnosis Date Noted   Vasomotor rhinitis 04/11/2021   Urticaria 12/15/2019   MCI (mild cognitive impairment) 02/06/2017   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   GERD (gastroesophageal reflux disease) 06/05/2016   PCP NOTES >>>>>>>>>>>>>>>>>>>>>>>>> 02/04/2016   Severe allergic reaction 08/17/2014   Allergic rhinitis 12/01/2011   Thrombocytopenia  (HCC) 08/27/2011   Hyperlipidemia 01/20/2011   Annual physical exam 09/18/2010   Hyperglycemia 09/17/2009   Backache 07/10/2008   Anxiety and depression 01/19/2008   HX OF GALLSTONE 10/20/2006   Essential hypertension 09/29/2006    PCP: [name], MD  REFERRING PROVIDER: Rodolph Bong, MD  REFERRING DIAG:  M54.2 (ICD-10-CM) - Neck pain M54.16 (ICD-10-CM) - Lumbar radiculopathy  Rationale for Evaluation and Treatment: Rehabilitation  THERAPY DIAG:  Cervicalgia  Other low back pain  Muscle weakness (generalized)  ONSET DATE: Chronic  SUBJECTIVE:  SUBJECTIVE STATEMENT: Pt presents to PT with continued reports of neck and R UE pain. Continues to deny pain in lower back. Has been compliant with HEP with no adverse effect.   PERTINENT HISTORY:  HTN  PAIN:  Are you having pain?  Yes: NPRS scale: 5/10 Worst: 10/10 Pain location: neck, R upper trap Pain description: tight, sharp Aggravating factors: unsure Relieving factors: heat  Are you having pain?  No: NPRS scale: 0/10 Worst: 5/10 Pain location: lower back, L LE Pain description: tight, sharp Aggravating factors: unsure Relieving factors: heat  PRECAUTIONS: None  WEIGHT BEARING RESTRICTIONS: No  FALLS:  Has patient fallen in last 6 months? No  LIVING ENVIRONMENT: Lives with: lives alone Lives in: House/apartment Stairs: Yes: External: 4 steps; can reach both  Has following equipment at home: N/A  OCCUPATION: Retired  PLOF: Independent  PATIENT GOALS: decrease neck pain, improve comfort with ADLs  OBJECTIVE:   DIAGNOSTIC FINDINGS:  See imaging  PATIENT SURVEYS:  FOTO (neck): 53% function; 63% predicted FOTO (back): 53% function; 65% predicted  COGNITION: Overall cognitive status: Within functional limits  for tasks assessed     SENSATION: WFL  POSTURE: rounded shoulders and forward head  PALPATION: TTP to R upper trap, cervical paraspinals  CERVICAL ROM:   AROM eval  Flexion   Extension   Right lateral flexion   Left lateral flexion   Right rotation 35  Left rotation 50   (Blank rows = not tested)  LUMBAR SPECIAL TESTS:  Straight leg raise test: Negative and Slump test: Negative  FUNCTIONAL TESTS:  30 Second Sit to Stand: 15 reps  GAIT: Distance walked: 45ft Assistive device utilized: None Level of assistance: Complete Independence Comments: decrease gait speed  TREATMENT: [facility] Adult PT Treatment:                                                DATE: 01/14/2023 Therapeutic Exercise: NuStep lvl 5 UE/LE x 3 min while taking subjective Seated bilateral ER 2x10 GTB Supine horizontal abd 2x10 GTB Supine dow flexion 2x10 2# Supine chin tuck 2x10 - 3" hold Supine SLR 2x10 each Bridge 2x10 Supine clamshell 2x15 GTB Supine piriformis stretch 2x30" L Row 3x10 GTB Shouler ext 2x10 GTB  [facility] Adult PT Treatment:                                                DATE: 12/29/2022 Therapeutic Exercise: NuStep lvl 5 UE/LE x 4 min while taking subjective Row 3x10 13# Shouler ext 2x10 13# Seated bilateral ER GTB 2x15 Supine horizontal abd 2x10 GTB Supine chin tuck 2x10 - 3" hold Manual Therapy: STM and trigger point release to R upper trap Suboccipital release Positional release to R upper trap Modalities: MHP to cervical spine post session x 10 min in supine  [facility] Adult PT Treatment:                                                DATE: 12/22/2022 Therapeutic Exercise: NuStep lvl 5 UE/LE x 4 min while taking subjective Row 3x10 GTB Shouler ext 2x10 GTB  Seated bilateral ER GTB 2x10 Supine horizontal abd 2x10 GTB Supine chin tuck 2x10 - 5" hold Supine serratus lift 2x10 YTB  Manual Therapy: STM and trigger point release to R upper trap Suboccipital release Positional  release to R upper trap Modalities: MHP to cervical spine post session x 10 min in supine  [facility] Adult PT Treatment:                                                DATE: 12/11/2022 Therapeutic Exercise: Row x 10 GTB Seated bilateral ER GTB x 5 Supine chin tuck x 5 - 5" hold Seated upper trap stretch x 30" R  PATIENT EDUCATION:  Education details: continue HEP Person educated: Patient Education method: Explanation, Demonstration, and Handouts Education comprehension: verbalized understanding and returned demonstration  HOME EXERCISE PROGRAM: Access Code: [email or web address] URL: [email or web address] Date: 01/14/2023 Prepared by: Edwinna Areola  Exercises - Standing Shoulder Row with Anchored Resistance  - 1 x daily - 7 x weekly - 3 sets - 10 reps - green band hold - Shoulder External Rotation and Scapular Retraction with Resistance  - 1 x daily - 7 x weekly - 3 sets - 10 reps - green band hold - Supine Chin Tuck  - 1 x daily - 7 x weekly - 2 sets - 10 reps - 5 sec hold - Seated Upper Trapezius Stretch  - 1 x daily - 7 x weekly - 2 reps - 30 sec hold - Supine Shoulder Horizontal Abduction with Resistance  - 1 x daily - 7 x weekly - 2 sets - 10 reps - green band hold - Active Straight Leg Raise with Quad Set  - 1 x daily - 7 x weekly - 2 sets - 10 reps - Supine Bridge  - 1 x daily - 7 x weekly - 2 sets - 10 reps - Hooklying Clamshell with Resistance  - 1 x daily - 7 x weekly - 3 sets - 15 reps - green band hold - Supine Piriformis Stretch with Leg Straight  - 1 x daily - 7 x weekly - 2 reps - 30 sec hold  ASSESSMENT:  CLINICAL IMPRESSION: Pt was able to complete all prescribed exercises with no adverse effect. Therapy focused on periscapular and DNF strength as well as focus on proximal hip strengthening with HEP updated for hip emphasis. Pt continues to benefit from skilled PT services, will continue per POC.   OBJECTIVE IMPAIRMENTS: decreased activity tolerance.   ACTIVITY  LIMITATIONS: carrying, lifting, sleeping, and reach over head  PARTICIPATION LIMITATIONS: driving, shopping, community activity, and yard work  PERSONAL FACTORS: Time since onset of injury/illness/exacerbation and 1-2 comorbidities: HTN  are also affecting patient's functional outcome.    GOALS: Goals reviewed with patient? No  SHORT TERM GOALS: Target date: 01/01/2023   Pt will be compliant and knowledgeable with initial HEP for improved comfort and carryover Baseline: initial HEP given  Goal status: INITIAL  2.  Pt will self report neck and low back pain no greater than 6/10 for improved comfort and functional ability Baseline: 10/10 at worst Goal status: INITIAL   LONG TERM GOALS: Target date: 02/05/2023   Pt will improve FOTO (neck) function score to no less than 63% as proxy for functional improvement Baseline: 53% function Goal status: INITIAL   2.  Pt will self report neck and low  back pain no greater than 3/10 for improved comfort and functional ability Baseline: 10/10 at worst Goal status: INITIAL   3.  Pt will improve FOTO (lumbar) function score to no less than 65% as proxy for functional improvement Baseline: 53% function Goal status: INITIAL   4.  Pt will improve R cervical rotation to at least 50 degrees for improved comfort and ability with driving and home ADLs Baseline: see ROM chart  Goal status: INITIAL  PLAN:  PT FREQUENCY: 1x/week  PT DURATION: 8 weeks  PLANNED INTERVENTIONS: Therapeutic exercises, Therapeutic activity, Neuromuscular re-education, Balance training, Gait training, Patient/Family education, Self Care, Joint mobilization, Dry Needling, Electrical stimulation, Cryotherapy, Moist heat, Manual therapy, and Re-evaluation.  PLAN FOR NEXT SESSION: assess HEP response, TPDN and manual, DNF/core/periscapular strengthening   Eloy End, PT 01/14/2023, 2:43 PM

## 2023-01-16 NOTE — Progress Notes (Unsigned)
   Rubin Payor, PhD, LAT, ATC acting as a scribe for Monique Graham, MD.  Monique [name] is a 82 y.o. female who presents to [facility] at [facility] today for 6-wk f/u neck and L leg pain. Pt was last seen by Dr. Denyse Amass on 12/04/22 and her labs were checked, she was prescribed gabapentin, and referred to PT, completing 4 visits.   Today, pt reports her pain is improving a bit. Pain in her L leg is about the same. She's been diligent about her HEP. Not taking gabapentin  She notes some continued neck and left leg pain.  She has 1 more physical therapy visit scheduled for the 24th.  Her son had a stroke recently and lives in [place].  His wife her daughter-in-law recently had gallbladder surgery.  Both her son and her daughter-in-law will need some help and live in [place].  She is thinking about going to [facility] for a month to help her family out.  This should be next week.  Dx testing: 12/04/22 L-spine & C-spine XR and labs  Pertinent review of systems: No fevers or chills  Relevant historical information: Hypertension   Exam:  BP 116/68   Pulse 86   Ht 4\' 11"  (1.499 m)   Wt 154 lb (69.9 kg)   SpO2 97%   BMI 31.10 kg/m  General: Well Developed, well nourished, and in no acute distress.   MSK: C-spine: Normal appearing. Nontender to palpation.  Decreased cervical motion.  L-spine nontender to palpation normal motion.      Assessment and Plan: 82 y.o. female with chronic right neck and chronic left low back and leg pain.  Performed physical therapy for about 6 weeks.  She has 1 more formal physical therapy session scheduled.  Recommend keeping that appointment and then proceeding with home exercise program while she is going to be in [facility] for the next month.  Next step would be a lumbar spine MRI if needed.  Could authorize more PT if needed in [facility].  Check when she gets back here in town if still bothersome.   PDMP not reviewed  this encounter. No orders of the defined types were placed in this encounter.  No orders of the defined types were placed in this encounter.    Discussed warning signs or symptoms. Please see discharge instructions. Patient expresses understanding.   The above documentation has been reviewed and is accurate and complete Monique [name], M.D.

## 2023-01-19 ENCOUNTER — Ambulatory Visit: Payer: PPO | Admitting: Family Medicine

## 2023-01-19 VITALS — BP 116/68 | HR 86 | Ht 59.0 in | Wt 154.0 lb

## 2023-01-19 DIAGNOSIS — M542 Cervicalgia: Secondary | ICD-10-CM | POA: Diagnosis not present

## 2023-01-19 DIAGNOSIS — M5416 Radiculopathy, lumbar region: Secondary | ICD-10-CM | POA: Diagnosis not present

## 2023-01-19 NOTE — Patient Instructions (Addendum)
Thank you for coming in today.   Do the last scheduled PT visit on the 24th.   I think going to help your son and dughter in law in [place].   If you need something like PT or a medicine sent there be sure to ask Dr Drue Novel or me.  Make sure to remind [place] the medicine needs to go to the pharmacy in [place].   Let me know what you need when you need it.   Next step beyond PT and home exercises could be MRI.

## 2023-01-21 ENCOUNTER — Ambulatory Visit: Payer: PPO

## 2023-01-21 ENCOUNTER — Encounter: Payer: Self-pay | Admitting: Podiatry

## 2023-01-21 ENCOUNTER — Ambulatory Visit: Payer: PPO | Admitting: Podiatry

## 2023-01-21 DIAGNOSIS — M5459 Other low back pain: Secondary | ICD-10-CM

## 2023-01-21 DIAGNOSIS — M6281 Muscle weakness (generalized): Secondary | ICD-10-CM

## 2023-01-21 DIAGNOSIS — M79674 Pain in right toe(s): Secondary | ICD-10-CM

## 2023-01-21 DIAGNOSIS — B351 Tinea unguium: Secondary | ICD-10-CM

## 2023-01-21 DIAGNOSIS — I739 Peripheral vascular disease, unspecified: Secondary | ICD-10-CM | POA: Diagnosis not present

## 2023-01-21 DIAGNOSIS — M542 Cervicalgia: Secondary | ICD-10-CM | POA: Diagnosis not present

## 2023-01-21 DIAGNOSIS — M79675 Pain in left toe(s): Secondary | ICD-10-CM | POA: Diagnosis not present

## 2023-01-21 NOTE — Progress Notes (Signed)
Subjective: Monique [name] is a 82 y.o. female patient seen today in office with complaint of mildly painful thickened and elongated toenails; unable to trim. Patient denies history of Diabetes, Neuropathy, or known vascular disease but states that her husband used to trim her toenails and he is deceased now and can no longer do it so she needs help has difficulty reaching and seeing her toes to properly trim them. Patient has no other pedal complaints at this time.   Patient Active Problem List   Diagnosis Date Noted   Vasomotor rhinitis 04/11/2021   Urticaria 12/15/2019   MCI (mild cognitive impairment) 02/06/2017   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   GERD (gastroesophageal reflux disease) 06/05/2016   PCP NOTES >>>>>>>>>>>>>>>>>>>>>>>>> 02/04/2016   Severe allergic reaction 08/17/2014   Allergic rhinitis 12/01/2011   Thrombocytopenia (HCC) 08/27/2011   Hyperlipidemia 01/20/2011   Annual physical exam 09/18/2010   Hyperglycemia 09/17/2009   Backache 07/10/2008   Anxiety and depression 01/19/2008   HX OF GALLSTONE 10/20/2006   Essential hypertension 09/29/2006    Current Outpatient Medications on File Prior to Visit  Medication Sig Dispense Refill   acetaminophen (TYLENOL) 500 MG tablet Take 500-1,000 mg by mouth daily as needed.     blood glucose meter kit and supplies Dispense based on patient and insurance preference. Check blood sugars no more than twice daily 1 each 0   brimonidine (ALPHAGAN) 0.2 % ophthalmic solution 1 drop 2 (two) times daily.     Calcium-Vitamin D (CALCIUM + D PO) Take 2 tablets by mouth daily with lunch. Calcium 600mg   Vitamin D 800 Units     carvedilol (COREG) 6.25 MG tablet Take 1 tablet (6.25 mg total) by mouth 2 (two) times daily with a meal. 180 tablet 1   cyclobenzaprine (FLEXERIL) 10 MG tablet Take 1 tablet (10 mg total) by mouth at bedtime as needed for muscle spasms. 30 tablet 1   cycloSPORINE (RESTASIS) 0.05 % ophthalmic emulsion Place 1 drop into  both eyes 2 (two) times daily.     diclofenac Sodium (VOLTAREN) 1 % GEL Apply topically 4 (four) times daily. If needed     diphenhydrAMINE (BENADRYL) 12.5 MG/5ML liquid Take 25 mg by mouth every 6 (six) hours as needed for allergies. Reported on 08/06/2015     EPINEPHrine (EPIPEN 2-PAK) 0.3 mg/0.3 mL IJ SOAJ injection Inject 0.3 mLs (0.3 mg total) into the muscle once. (Patient not taking: Reported on 01/12/2023) 1 Device 1   ezetimibe (ZETIA) 10 MG tablet Take 1 tablet (10 mg total) by mouth daily. 90 tablet 1   gabapentin (NEURONTIN) 100 MG capsule Take 1-3 capsules (100-300 mg total) by mouth at bedtime as needed (nerve pain). (Patient not taking: Reported on 01/05/2023) 90 capsule 3   glucose blood (ONETOUCH ULTRA) test strip CHECK BLOOD SUGAR NO MORE THAN TWICE DAILY 100 strip 12   Lancets (ONETOUCH DELICA PLUS LANCET33G) MISC CHECK BLOOD SUGAR NO MORE THAN TWICE DAILY 100 each 12   latanoprost (XALATAN) 0.005 % ophthalmic solution Place 1 drop into the left eye at bedtime. Dr. Harriette Bouillon     Magnesium Oxide -Mg Supplement 250 MG TABS Take 1 tablet (250 mg total) by mouth daily.  0   meclizine (ANTIVERT) 25 MG tablet Take 25 mg by mouth daily as needed for dizziness.     mometasone (ELOCON) 0.1 % cream      ondansetron (ZOFRAN-ODT) 4 MG disintegrating tablet Take 4-8 mg by mouth every 4 (four) hours as needed. (Patient not taking:  Reported on 01/12/2023)     Polyethyl Glycol-Propyl Glycol 0.4-0.3 % SOLN Place 1 drop into both eyes daily as needed (dry eyes). Systane     spironolactone (ALDACTONE) 25 MG tablet Take 0.5 tablets (12.5 mg total) by mouth daily.     triamcinolone cream (KENALOG) 0.1 %      vitamin C (ASCORBIC ACID) 500 MG tablet Take 500 mg by mouth daily.     No current facility-administered medications on file prior to visit.    Allergies  Allergen Reactions   Bee Venom Anaphylaxis   Doxycycline Hives and Itching   Penicillins Itching, Nausea And Vomiting and Swelling     syncope   Bee Pollen     Other reaction(s): Unknown   Azithromycin Itching and Rash   Bactrim [Sulfamethoxazole-Trimethoprim] Other (See Comments)    Stomach pains   Cerumenex [Trolamine (Triethanolamine)] Itching and Rash   Climara [Estradiol] Itching and Rash   Nickel Rash   Sertraline Other (See Comments)    Dry Mouth    Objective: Physical Exam  General: Well developed, nourished, no acute distress, awake, alert and oriented x 3  Vascular: Dorsalis pedis artery 1/4 bilateral, Posterior tibial artery 1/4 bilateral, skin temperature warm to warm proximal to distal bilateral lower extremities, mild varicosities, scant pedal hair present bilateral.  Trace edema to ankles bilateral.  Neurological: Gross sensation present via light touch bilateral.   Dermatological: Skin is warm, dry, and supple bilateral, Nails 1-10 are tender, long, thick, and discolored with mild subungal debris, no webspace macerations present bilateral, no open lesions present bilateral, no significant callus/corns/hyperkeratotic tissue present bilateral. No signs of infection bilateral.  Musculoskeletal: No symptomatic boney deformities noted bilateral. Muscular strength within normal limits without painon range of motion. No pain with calf compression bilateral.  Assessment and Plan:  Problem List Items Addressed This Visit   None    -Examined patient.  -Discussed treatment options for painful mycotic nails. -Mechanically debrided and reduced mycotic nails with sterile nail nipper and dremel nail file without incident. -Advised elevation of legs to assist with edema control and swelling that is occasional at ankles and to avoid foods that have excessive salt including ham -Patient to return in 3 months for follow up evaluation or sooner if symptoms worsen.  Louann Sjogren, DPM

## 2023-01-21 NOTE — Therapy (Signed)
OUTPATIENT PHYSICAL THERAPY TREATMENT NOTE/DISCHARGE  PHYSICAL THERAPY DISCHARGE SUMMARY  Visits from Start of Care: 5  Current functional level related to goals / functional outcomes: See goals and objective   Remaining deficits: See goals and objective   Education / Equipment: HEP   Patient agrees to discharge. Patient goals were  mostly met . Patient is being discharged due to meeting the stated rehab goals.   Patient Name: Monique [name] MRN: [id number] DOB:[birth year], 82 y.o., female Today's Date: 01/21/2023  END OF SESSION:  PT End of Session - 01/21/23 1122     Visit Number 5    Number of Visits 9    Date for PT Re-Evaluation 02/05/23    Authorization Type HTA    PT Start Time 1125    PT Stop Time 1205    PT Time Calculation (min) 40 min    Activity Tolerance Patient tolerated treatment well    Behavior During Therapy WFL for tasks assessed/performed                 Past Medical History:  Diagnosis Date   Allergy    Anemia    when had uterine fibroids   Anxiety    Arthritis    Cataract    very small   Cholelithiasis    Closed fracture of base of fifth metacarpal bone of left hand 06/01/2018   DJD (degenerative joint disease)    Dry eye syndrome    Headache(784.0)    Hyperlipidemia 01/20/2011   Hypertension 09/29/2006   Pinched nerve in neck    Substance abuse (HCC)    Thrombocytopenia (HCC) 08/27/2011   hematology evaluation 08-2011, return prn   Urticaria    Vasomotor rhinitis    Past Surgical History:  Procedure Laterality Date   ABDOMINAL HYSTERECTOMY     no oophorectomy   APPENDECTOMY     CHOLECYSTECTOMY N/A 06/06/2016   Procedure: LAPAROSCOPIC CHOLECYSTECTOMY WITH INTRAOPERATIVE CHOLANGIOGRAM;  Surgeon: Luretha Murphy, MD;  Location: [facility];  Service: General;  Laterality: N/A;   DILATION AND CURETTAGE OF UTERUS     EYE SURGERY Bilateral 05/06/2019   HERNIA REPAIR     mole removals     TONSILLECTOMY     UMBILICAL HERNIA REPAIR      Patient Active Problem List   Diagnosis Date Noted   Vasomotor rhinitis 04/11/2021   Urticaria 12/15/2019   MCI (mild cognitive impairment) 02/06/2017   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   GERD (gastroesophageal reflux disease) 06/05/2016   PCP NOTES >>>>>>>>>>>>>>>>>>>>>>>>> 02/04/2016   Severe allergic reaction 08/17/2014   Allergic rhinitis 12/01/2011   Thrombocytopenia (HCC) 08/27/2011   Hyperlipidemia 01/20/2011   Annual physical exam 09/18/2010   Hyperglycemia 09/17/2009   Backache 07/10/2008   Anxiety and depression 01/19/2008   HX OF GALLSTONE 10/20/2006   Essential hypertension 09/29/2006    PCP: [name], MD  REFERRING PROVIDER: Rodolph Bong, MD  REFERRING DIAG:  M54.2 (ICD-10-CM) - Neck pain M54.16 (ICD-10-CM) - Lumbar radiculopathy  Rationale for Evaluation and Treatment: Rehabilitation  THERAPY DIAG:  Cervicalgia  Other low back pain  Muscle weakness (generalized)  ONSET DATE: Chronic  SUBJECTIVE:  SUBJECTIVE STATEMENT: Pt presents to PT with reports of continued neck and lower back pain. Has continued compliance with HEP, had follow up with Dr. Denyse Amass this week. She has started gabapentin for her referral of pain into extremities, believes this is helping. She will be going to [facility] soon to help her son and DIL after her son had a stroke. Feels ready for discharge at present.   PERTINENT HISTORY:  HTN  PAIN:  Are you having pain?  Yes: NPRS scale: 5/10 Worst: 10/10 Pain location: neck, R upper trap Pain description: tight, sharp Aggravating factors: unsure Relieving factors: heat  Are you having pain?  No: NPRS scale: 0/10 Worst: 5/10 Pain location: lower back, L LE Pain description: tight, sharp Aggravating factors: unsure Relieving factors:  heat  PRECAUTIONS: None  WEIGHT BEARING RESTRICTIONS: No  FALLS:  Has patient fallen in last 6 months? No  LIVING ENVIRONMENT: Lives with: lives alone Lives in: House/apartment Stairs: Yes: External: 4 steps; can reach both  Has following equipment at home: N/A  OCCUPATION: Retired  PLOF: Independent  PATIENT GOALS: decrease neck pain, improve comfort with ADLs  OBJECTIVE:   DIAGNOSTIC FINDINGS:  See imaging  PATIENT SURVEYS:  FOTO (neck): 53% function; 63% predicted 01/21/2023: 63% function  FOTO (back): 53% function; 65% predicted 01/21/2023: 60% function  COGNITION: Overall cognitive status: Within functional limits for tasks assessed     SENSATION: WFL  POSTURE: rounded shoulders and forward head  PALPATION: TTP to R upper trap, cervical paraspinals  CERVICAL ROM:   AROM eval 01/21/23  Flexion    Extension    Right lateral flexion    Left lateral flexion    Right rotation 35 55  Left rotation 50    (Blank rows = not tested)  LUMBAR SPECIAL TESTS:  Straight leg raise test: Negative and Slump test: Negative  FUNCTIONAL TESTS:  30 Second Sit to Stand: 15 reps  GAIT: Distance walked: 43ft Assistive device utilized: None Level of assistance: Complete Independence Comments: decrease gait speed  TREATMENT: [facility] Adult PT Treatment:                                                DATE: 01/21/2023 Therapeutic Exercise: NuStep lvl 5 UE/LE x 3 min while taking subjective Supine SLR 2x10 each Bridge 2x10 Supine clamshell 2x15 GTB Supine piriformis stretch x 30" each Supine chin tuck 2x10 - 5" hold Seated bilateral ER 2x10 GTB Seated horizontal abd 2x10 GTB Row 3x10 GTB Seated upper trap stretch x 30" each Therapeutic Exercise: Assessment of tests/measures, goals, and outcomes for discharge  [facility] Adult PT Treatment:                                                DATE: 01/14/2023 Therapeutic Exercise: NuStep lvl 5 UE/LE x 3 min while taking  subjective Seated bilateral ER 2x10 GTB Supine horizontal abd 2x10 GTB Supine dow flexion 2x10 2# Supine chin tuck 2x10 - 3" hold Supine SLR 2x10 each Bridge 2x10 Supine clamshell 2x15 GTB Supine piriformis stretch 2x30" L Row 3x10 GTB Shouler ext 2x10 GTB  [facility] Adult PT Treatment:  DATE: 12/29/2022 Therapeutic Exercise: NuStep lvl 5 UE/LE x 4 min while taking subjective Row 3x10 13# Shouler ext 2x10 13# Seated bilateral ER GTB 2x15 Supine horizontal abd 2x10 GTB Supine chin tuck 2x10 - 3" hold Manual Therapy: STM and trigger point release to R upper trap Suboccipital release Positional release to R upper trap Modalities: MHP to cervical spine post session x 10 min in supine  [facility] Adult PT Treatment:                                                DATE: 12/22/2022 Therapeutic Exercise: NuStep lvl 5 UE/LE x 4 min while taking subjective Row 3x10 GTB Shouler ext 2x10 GTB Seated bilateral ER GTB 2x10 Supine horizontal abd 2x10 GTB Supine chin tuck 2x10 - 5" hold Supine serratus lift 2x10 YTB  Manual Therapy: STM and trigger point release to R upper trap Suboccipital release Positional release to R upper trap Modalities: MHP to cervical spine post session x 10 min in supine  [facility] Adult PT Treatment:                                                DATE: 12/11/2022 Therapeutic Exercise: Row x 10 GTB Seated bilateral ER GTB x 5 Supine chin tuck x 5 - 5" hold Seated upper trap stretch x 30" R  PATIENT EDUCATION:  Education details: continue HEP Person educated: Patient Education method: Explanation, Demonstration, and Handouts Education comprehension: verbalized understanding and returned demonstration  HOME EXERCISE PROGRAM: Access Code: [email or web address] URL: [email or web address] Date: 01/14/2023 Prepared by: Edwinna Areola  Exercises - Standing Shoulder Row with Anchored Resistance  - 1 x daily - 7 x weekly - 3 sets  - 10 reps - green band hold - Shoulder External Rotation and Scapular Retraction with Resistance  - 1 x daily - 7 x weekly - 3 sets - 10 reps - green band hold - Supine Chin Tuck  - 1 x daily - 7 x weekly - 2 sets - 10 reps - 5 sec hold - Seated Upper Trapezius Stretch  - 1 x daily - 7 x weekly - 2 reps - 30 sec hold - Supine Shoulder Horizontal Abduction with Resistance  - 1 x daily - 7 x weekly - 2 sets - 10 reps - green band hold - Active Straight Leg Raise with Quad Set  - 1 x daily - 7 x weekly - 2 sets - 10 reps - Supine Bridge  - 1 x daily - 7 x weekly - 2 sets - 10 reps - Hooklying Clamshell with Resistance  - 1 x daily - 7 x weekly - 3 sets - 15 reps - green band hold - Supine Piriformis Stretch with Leg Straight  - 1 x daily - 7 x weekly - 2 reps - 30 sec hold  ASSESSMENT:  CLINICAL IMPRESSION: Pt was able to complete all prescribed exercises and demonstrated knowledge of HEP with no adverse effect. Over the course of PT treatment she has progressed well, meeting most LTGs and improving subjective functional ability with neck and lower back FOTO scores. Cervical ROM improved with decreased pain noted. She should continue to improve with HEP compliance and is ready to discharge at this  time.   OBJECTIVE IMPAIRMENTS: decreased activity tolerance.   ACTIVITY LIMITATIONS: carrying, lifting, sleeping, and reach over head  PARTICIPATION LIMITATIONS: driving, shopping, community activity, and yard work  PERSONAL FACTORS: Time since onset of injury/illness/exacerbation and 1-2 comorbidities: HTN  are also affecting patient's functional outcome.    GOALS: Goals reviewed with patient? No  SHORT TERM GOALS: Target date: 01/01/2023   Pt will be compliant and knowledgeable with initial HEP for improved comfort and carryover Baseline: initial HEP given  Goal status: MET  2.  Pt will self report neck and low back pain no greater than 6/10 for improved comfort and functional  ability Baseline: 10/10 at worst Goal status: MET   LONG TERM GOALS: Target date: 02/05/2023   Pt will improve FOTO (neck) function score to no less than 63% as proxy for functional improvement Baseline: 53% function 01/21/2023: 63% function Goal status: MET   2.  Pt will self report neck and low back pain no greater than 3/10 for improved comfort and functional ability Baseline: 10/10 at worst 01/21/2023: 5/10 at worst Goal status: PARTIALLY MET   3.  Pt will improve FOTO (lumbar) function score to no less than 65% as proxy for functional improvement Baseline: 53% function 01/21/2023: 60% function Goal status: PARTIALLY MET  4.  Pt will improve R cervical rotation to at least 50 degrees for improved comfort and ability with driving and home ADLs Baseline: see ROM chart  01/21/2023: 55 degrees Goal status: MET  PLAN:  PT FREQUENCY: 1x/week  PT DURATION: 8 weeks  PLANNED INTERVENTIONS: Therapeutic exercises, Therapeutic activity, Neuromuscular re-education, Balance training, Gait training, Patient/Family education, Self Care, Joint mobilization, Dry Needling, Electrical stimulation, Cryotherapy, Moist heat, Manual therapy, and Re-evaluation.  PLAN FOR NEXT SESSION: assess HEP response, TPDN and manual, DNF/core/periscapular strengthening   Eloy End, PT 01/21/2023, 12:12 PM

## 2023-02-02 DIAGNOSIS — Z79899 Other long term (current) drug therapy: Secondary | ICD-10-CM | POA: Diagnosis not present

## 2023-02-12 DIAGNOSIS — H04123 Dry eye syndrome of bilateral lacrimal glands: Secondary | ICD-10-CM | POA: Diagnosis not present

## 2023-02-12 DIAGNOSIS — R42 Dizziness and giddiness: Secondary | ICD-10-CM | POA: Diagnosis not present

## 2023-02-12 DIAGNOSIS — I10 Essential (primary) hypertension: Secondary | ICD-10-CM | POA: Diagnosis not present

## 2023-02-12 DIAGNOSIS — M1990 Unspecified osteoarthritis, unspecified site: Secondary | ICD-10-CM | POA: Diagnosis not present

## 2023-02-12 DIAGNOSIS — Z0289 Encounter for other administrative examinations: Secondary | ICD-10-CM | POA: Diagnosis not present

## 2023-02-12 DIAGNOSIS — E785 Hyperlipidemia, unspecified: Secondary | ICD-10-CM | POA: Diagnosis not present

## 2023-02-12 DIAGNOSIS — M62838 Other muscle spasm: Secondary | ICD-10-CM | POA: Diagnosis not present

## 2023-02-12 DIAGNOSIS — R110 Nausea: Secondary | ICD-10-CM | POA: Diagnosis not present

## 2023-03-12 ENCOUNTER — Other Ambulatory Visit: Payer: Self-pay | Admitting: Internal Medicine

## 2023-03-17 DIAGNOSIS — Z0289 Encounter for other administrative examinations: Secondary | ICD-10-CM | POA: Diagnosis not present

## 2023-03-17 DIAGNOSIS — R109 Unspecified abdominal pain: Secondary | ICD-10-CM | POA: Diagnosis not present

## 2023-03-17 DIAGNOSIS — R42 Dizziness and giddiness: Secondary | ICD-10-CM | POA: Diagnosis not present

## 2023-04-21 ENCOUNTER — Ambulatory Visit (INDEPENDENT_AMBULATORY_CARE_PROVIDER_SITE_OTHER): Payer: Medicare HMO

## 2023-04-21 ENCOUNTER — Ambulatory Visit: Payer: Medicare HMO | Admitting: Family Medicine

## 2023-04-21 VITALS — BP 128/74 | HR 77 | Ht 59.0 in | Wt 155.0 lb

## 2023-04-21 DIAGNOSIS — M25562 Pain in left knee: Secondary | ICD-10-CM

## 2023-04-21 DIAGNOSIS — G8929 Other chronic pain: Secondary | ICD-10-CM | POA: Diagnosis not present

## 2023-04-21 DIAGNOSIS — M1712 Unilateral primary osteoarthritis, left knee: Secondary | ICD-10-CM | POA: Diagnosis not present

## 2023-04-21 DIAGNOSIS — M5416 Radiculopathy, lumbar region: Secondary | ICD-10-CM

## 2023-04-21 NOTE — Patient Instructions (Addendum)
Thank you for coming in today.   Please get an Xray today before you leave   You should hear from MRI scheduling within 1 week. If you do not hear please let me know.    Recheck once we get the report back from the MRI.

## 2023-04-21 NOTE — Progress Notes (Signed)
   Rubin Payor, PhD, LAT, ATC acting as a scribe for [name], MD.  Monique [name] is a 82 y.o. female who presents to [facility] at [facility] today for cont'd L leg pain thought to be due to lumbar radiculopathy. Pt was last seen by Dr. Denyse Amass on 01/19/23 and was advised to finish out PT and cont HEP.  Today, pt reports L leg pain worsened over the last few months. Pt locates pain to all over the entire L leg. She notes sometimes her L LE will swell. She notes weakness in her L leg. She also points to pain to her L lateral hip and buttock.   Dx testing: 12/04/22 L-spine XR and labs   Pertinent review of systems: No fevers or chills  Relevant historical information: Hypertension   Exam:  BP 128/74   Pulse 77   Ht 4\' 11"  (1.499 m)   Wt 155 lb (70.3 kg)   SpO2 94%   BMI 31.31 kg/m  General: Well Developed, well nourished, and in no acute distress.   MSK: L-spine normal appearing Nontender to palpation spinal midline.  Normal lumbar motion. Lower extremity strength is intact.  Left knee mild effusion normal motion with crepitation.  Lower leg no swelling or edema.  Calf is nontender.    Lab and Radiology Results  X-ray images left knee obtained today personally and independently interpreted Mild medial patellofemoral DJD.  No acute fractures. Await formal radiology review     Assessment and Plan: 82 y.o. female with chronic left leg pain.  Pain thought to be multifactorial.  I do think she has a component of lumbar radiculopathy likely L4-L5 or S1 or maybe L3.  She also I think has some pain related to knee and knee arthritis.  Plan for MRI lumbar spine updated x-ray and check back once we get the results of the MRI to determine future treatment plans and options.   PDMP not reviewed this encounter. Orders Placed This Encounter  Procedures   MR Lumbar Spine Wo Contrast    Standing Status:   Future    Standing Expiration Date:   04/20/2024     Order Specific Question:   What is the patient's sedation requirement?    Answer:   No Sedation    Order Specific Question:   Does the patient have a pacemaker or implanted devices?    Answer:   No    Order Specific Question:   Preferred imaging location?    Answer:   [facility] (table limit-550lbs)   DG Knee AP/LAT W/[place] Left    Standing Status:   Future    Number of Occurrences:   1    Standing Expiration Date:   04/20/2024    Order Specific Question:   Reason for Exam (SYMPTOM  OR DIAGNOSIS REQUIRED)    Answer:   eval left knee pain    Order Specific Question:   Preferred imaging location?    Answer:   [facility]   No orders of the defined types were placed in this encounter.    Discussed warning signs or symptoms. Please see discharge instructions. Patient expresses understanding.   The above documentation has been reviewed and is accurate and complete [name], M.D.

## 2023-04-27 ENCOUNTER — Ambulatory Visit: Payer: PPO | Admitting: Podiatry

## 2023-05-03 ENCOUNTER — Ambulatory Visit
Admission: RE | Admit: 2023-05-03 | Discharge: 2023-05-03 | Disposition: A | Discharge status: Home / Self Care | Payer: Medicare HMO | Source: Ambulatory Visit | Attending: Family Medicine | Admitting: Family Medicine

## 2023-05-03 DIAGNOSIS — M5416 Radiculopathy, lumbar region: Secondary | ICD-10-CM

## 2023-05-03 DIAGNOSIS — G8929 Other chronic pain: Secondary | ICD-10-CM | POA: Diagnosis not present

## 2023-05-11 NOTE — Progress Notes (Signed)
Low back MRI shows areas where the nerves could be pinched causing leg and back pain.  I think we should do a back injection.  I would like you to come back so we can talk about the results of this MRI and the kind of injections that might help.

## 2023-05-11 NOTE — Progress Notes (Signed)
Left knee x-ray shows arthritis.

## 2023-05-12 ENCOUNTER — Other Ambulatory Visit: Payer: Self-pay | Admitting: Family

## 2023-05-12 DIAGNOSIS — I10 Essential (primary) hypertension: Secondary | ICD-10-CM | POA: Diagnosis not present

## 2023-05-12 DIAGNOSIS — Z0289 Encounter for other administrative examinations: Secondary | ICD-10-CM | POA: Diagnosis not present

## 2023-05-12 DIAGNOSIS — Z1231 Encounter for screening mammogram for malignant neoplasm of breast: Secondary | ICD-10-CM

## 2023-05-14 NOTE — Progress Notes (Signed)
Pt returned VM and was provided Dr. Zollie Pee results note. F/u visits scheduled for 11/19. She verbalized understanding.

## 2023-05-19 ENCOUNTER — Ambulatory Visit: Payer: Medicare HMO | Admitting: Family Medicine

## 2023-05-19 VITALS — BP 116/74 | HR 86 | Ht 59.0 in | Wt 156.0 lb

## 2023-05-19 DIAGNOSIS — M5416 Radiculopathy, lumbar region: Secondary | ICD-10-CM | POA: Diagnosis not present

## 2023-05-19 NOTE — Progress Notes (Unsigned)
Rubin Payor, PhD, LAT, ATC acting as a scribe for [name], MD.  Monique [name] is a 82 y.o. female who presents to [facility] at [facility] today for f/u lumbar radiculopathy with MRI review.  Patient was last seen by Dr. Denyse Amass on 04/21/2023 and a L-spine MRI was ordered and updated XR obtained.  Today, pt reports not quite as painful as it was. She has been exercising, which she finds helpful. She is only taking the Gabapentin prn, seldom. Radiating pain into her L leg is also somewhat improved.   Dx testing: 05/03/23 L-spine MRI  04/21/23 L-spine & L knee XR 12/04/22 L-spine XR and lab   Pertinent review of systems: No fevers or chills  Relevant historical information: Hypertension   Exam:  BP 116/74   Pulse 86   Ht 4\' 11"  (1.499 m)   Wt 156 lb (70.8 kg)   SpO2 96%   BMI 31.51 kg/m  General: Well Developed, well nourished, and in no acute distress.   MSK: Lumbar spine: Normal-appearing Nontender palpation midline.  Decreased lumbar motion. Lower extremity strength is intact. Reflexes are intact.    Lab and Radiology Results  EXAM: MRI LUMBAR SPINE WITHOUT CONTRAST   TECHNIQUE: Multiplanar, multisequence MR imaging of the lumbar spine was performed. No intravenous contrast was administered.   COMPARISON:  Lumbar spine x-rays dated December 04, 2022. MRI lumbar spine dated July 14, 2008.   FINDINGS: Segmentation:  Standard.   Alignment: 5 mm anterolisthesis at L3-L4, new since 2010. 7 mm anterolisthesis at L4-L5, slightly increased.   Vertebrae: No fracture, evidence of discitis, or bone lesion. Degenerative endplate marrow edema at T11-T12 and T12-L1.   Conus medullaris and cauda equina: Conus extends to the L1 level. Conus and cauda equina appear normal.   Paraspinal and other soft tissues: Negative.   Disc levels:   T12-L1: Mild disc bulging and bilateral facet arthropathy. No stenosis.   L1-L2: Mild disc bulging and  bilateral facet arthropathy. No stenosis.   L2-L3: Mild disc bulging and moderate bilateral facet arthropathy. Mild spinal canal stenosis. No neuroforaminal stenosis.   L3-L4: Disc uncovering and moderate disc bulging with severe bilateral facet arthropathy. Moderate to severe spinal canal, bilateral lateral recess, and bilateral neuroforaminal stenosis.   L4-L5: Disc uncovering and mild disc bulging. Severe bilateral facet arthropathy. Mild spinal canal stenosis. Severe bilateral lateral recess stenosis. Moderate to severe bilateral neuroforaminal stenosis.   L5-S1: Mild disc bulging. Severe bilateral facet arthropathy. Moderate left and mild right neuroforaminal stenosis. No spinal canal stenosis.   The above findings have progressed since 2010.   IMPRESSION: 1. 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 Moderate to severe stenosis at L3-L4. 2. Severe bilateral lateral recess and moderate to severe bilateral neuroforaminal stenosis at L4-L5. 3. Moderate left neuroforaminal stenosis at L5-S1.     Electronically Signed   By: Obie Dredge M.D.   On: 05/11/2023 12:35.xray      Assessment and Plan: 82 y.o. female with left leg pain.  She has extensive areas of potential lumbar radiculopathy on her recent MRI.  She could have problems at the L3, L4 and L5 levels on the left.  Fortunately she has improved a bit since her last visit.  We talked about the MRI results looked at the pictures and talked about treatment plan and options going forward.  Next step if needed would be an epidural steroid injection.  She will contact me if needed  and I can order an injection if needed.   PDMP not reviewed this encounter. No orders of the defined types were placed in this encounter.  No orders of the defined types were placed in this encounter.    Discussed warning signs or symptoms. Please see discharge instructions. Patient expresses  understanding.   The above documentation has been reviewed and is accurate and complete [name], M.D. Total encounter time 30 minutes including face-to-face time with the patient and, reviewing past medical record, and charting on the date of service.

## 2023-05-19 NOTE — Patient Instructions (Addendum)
Thank you for coming in today.   I can order an injection in your back if you pain worsens.   Let me know. We can do more.   OK to use gabapentin as needed.

## 2023-05-27 ENCOUNTER — Ambulatory Visit: Payer: Medicare HMO | Admitting: Podiatry

## 2023-05-27 ENCOUNTER — Encounter: Payer: Self-pay | Admitting: Podiatry

## 2023-05-27 DIAGNOSIS — M79675 Pain in left toe(s): Secondary | ICD-10-CM

## 2023-05-27 DIAGNOSIS — I739 Peripheral vascular disease, unspecified: Secondary | ICD-10-CM

## 2023-05-27 DIAGNOSIS — B351 Tinea unguium: Secondary | ICD-10-CM | POA: Diagnosis not present

## 2023-05-27 DIAGNOSIS — M79674 Pain in right toe(s): Secondary | ICD-10-CM | POA: Diagnosis not present

## 2023-05-27 NOTE — Progress Notes (Signed)
Subjective: Monique [name] is a 82 y.o. female patient seen today in office with complaint of mildly painful thickened and elongated toenails; unable to trim. Patient denies history of Diabetes, Neuropathy, or known vascular disease but states that her husband used to trim her toenails and he is deceased now and can no longer do it so she needs help has difficulty reaching and seeing her toes to properly trim them. Patient has no other pedal complaints at this time.   Patient Active Problem List   Diagnosis Date Noted   Vasomotor rhinitis 04/11/2021   Urticaria 12/15/2019   MCI (mild cognitive impairment) 02/06/2017   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   GERD (gastroesophageal reflux disease) 06/05/2016   PCP NOTES >>>>>>>>>>>>>>>>>>>>>>>>> 02/04/2016   Severe allergic reaction 08/17/2014   Allergic rhinitis 12/01/2011   Thrombocytopenia (HCC) 08/27/2011   Hyperlipidemia 01/20/2011   Annual physical exam 09/18/2010   Hyperglycemia 09/17/2009   Backache 07/10/2008   Anxiety and depression 01/19/2008   HX OF GALLSTONE 10/20/2006   Essential hypertension 09/29/2006    Current Outpatient Medications on File Prior to Visit  Medication Sig Dispense Refill   acetaminophen (TYLENOL) 500 MG tablet Take 500-1,000 mg by mouth daily as needed.     blood glucose meter kit and supplies Dispense based on patient and insurance preference. Check blood sugars no more than twice daily 1 each 0   brimonidine (ALPHAGAN) 0.2 % ophthalmic solution 1 drop 2 (two) times daily.     Calcium-Vitamin D (CALCIUM + D PO) Take 2 tablets by mouth daily with lunch. Calcium 600mg   Vitamin D 800 Units     carvedilol (COREG) 6.25 MG tablet Take 1 tablet (6.25 mg total) by mouth 2 (two) times daily with a meal. 180 tablet 1   cyclobenzaprine (FLEXERIL) 10 MG tablet Take 1 tablet (10 mg total) by mouth at bedtime as needed for muscle spasms. (Patient not taking: Reported on 04/21/2023) 30 tablet 1   cycloSPORINE (RESTASIS)  0.05 % ophthalmic emulsion Place 1 drop into both eyes 2 (two) times daily.     diclofenac Sodium (VOLTAREN) 1 % GEL Apply topically 4 (four) times daily. If needed     diphenhydrAMINE (BENADRYL) 12.5 MG/5ML liquid Take 25 mg by mouth every 6 (six) hours as needed for allergies. Reported on 08/06/2015     EPINEPHrine (EPIPEN 2-PAK) 0.3 mg/0.3 mL IJ SOAJ injection Inject 0.3 mLs (0.3 mg total) into the muscle once. (Patient not taking: Reported on 01/12/2023) 1 Device 1   ezetimibe (ZETIA) 10 MG tablet Take 1 tablet (10 mg total) by mouth daily. 90 tablet 1   gabapentin (NEURONTIN) 100 MG capsule Take 1-3 capsules (100-300 mg total) by mouth at bedtime as needed (nerve pain). (Patient not taking: Reported on 01/05/2023) 90 capsule 3   glucose blood (ONETOUCH ULTRA) test strip CHECK BLOOD SUGAR NO MORE THAN TWICE DAILY 100 strip 12   Lancets (ONETOUCH DELICA PLUS LANCET33G) MISC CHECK BLOOD SUGAR NO MORE THAN TWICE DAILY 100 each 12   latanoprost (XALATAN) 0.005 % ophthalmic solution Place 1 drop into the left eye at bedtime. Dr. Harriette Bouillon     Magnesium Oxide -Mg Supplement 250 MG TABS Take 1 tablet (250 mg total) by mouth daily.  0   meclizine (ANTIVERT) 25 MG tablet Take 25 mg by mouth daily as needed for dizziness.     mometasone (ELOCON) 0.1 % cream      ondansetron (ZOFRAN-ODT) 4 MG disintegrating tablet Take 4-8 mg by mouth every 4 (four)  hours as needed. (Patient not taking: Reported on 01/12/2023)     Polyethyl Glycol-Propyl Glycol 0.4-0.3 % SOLN Place 1 drop into both eyes daily as needed (dry eyes). Systane     spironolactone (ALDACTONE) 25 MG tablet Take 0.5 tablets (12.5 mg total) by mouth daily.     triamcinolone cream (KENALOG) 0.1 %      vitamin C (ASCORBIC ACID) 500 MG tablet Take 500 mg by mouth daily.     No current facility-administered medications on file prior to visit.    Allergies  Allergen Reactions   Bee Venom Anaphylaxis   Doxycycline Hives and Itching   Penicillins  Itching, Nausea And Vomiting and Swelling    syncope   Bee Pollen     Other reaction(s): Unknown   Azithromycin Itching and Rash   Bactrim [Sulfamethoxazole-Trimethoprim] Other (See Comments)    Stomach pains   Cerumenex [Trolamine (Triethanolamine)] Itching and Rash   Climara [Estradiol] Itching and Rash   Nickel Rash   Sertraline Other (See Comments)    Dry Mouth    Objective: Physical Exam  General: Well developed, nourished, no acute distress, awake, alert and oriented x 3  Vascular: Dorsalis pedis artery 1/4 bilateral, Posterior tibial artery 1/4 bilateral, skin temperature warm to warm proximal to distal bilateral lower extremities, mild varicosities, scant pedal hair present bilateral.  Trace edema to ankles bilateral.  Neurological: Gross sensation present via light touch bilateral.   Dermatological: Skin is warm, dry, and supple bilateral, Nails 1-10 are tender, long, thick, and discolored with mild subungal debris, no webspace macerations present bilateral, no open lesions present bilateral, no significant callus/corns/hyperkeratotic tissue present bilateral. No signs of infection bilateral.  Musculoskeletal: No symptomatic boney deformities noted bilateral. Muscular strength within normal limits without painon range of motion. No pain with calf compression bilateral.  Assessment and Plan:  Problem List Items Addressed This Visit   None Visit Diagnoses     Pain due to onychomycosis of toenails of both feet    -  Primary   PVD (peripheral vascular disease) (HCC)            -Examined patient.  -Discussed treatment options for painful mycotic nails. -Mechanically debrided and reduced mycotic nails with sterile nail nipper and dremel nail file without incident. -Advised elevation of legs to assist with edema control and swelling that is occasional at ankles and to avoid foods that have excessive salt including ham -Patient to return in 3 months for follow up  evaluation or sooner if symptoms worsen.  Louann Sjogren, DPM

## 2023-06-16 ENCOUNTER — Ambulatory Visit
Admission: RE | Admit: 2023-06-16 | Discharge: 2023-06-16 | Disposition: A | Discharge status: Home / Self Care | Payer: Medicare HMO | Source: Ambulatory Visit | Attending: Family | Admitting: Family

## 2023-06-16 DIAGNOSIS — Z1231 Encounter for screening mammogram for malignant neoplasm of breast: Secondary | ICD-10-CM

## 2023-07-03 DIAGNOSIS — Z9109 Other allergy status, other than to drugs and biological substances: Secondary | ICD-10-CM | POA: Diagnosis not present

## 2023-07-03 DIAGNOSIS — E785 Hyperlipidemia, unspecified: Secondary | ICD-10-CM | POA: Diagnosis not present

## 2023-07-03 DIAGNOSIS — Z1389 Encounter for screening for other disorder: Secondary | ICD-10-CM | POA: Diagnosis not present

## 2023-07-03 DIAGNOSIS — Z1339 Encounter for screening examination for other mental health and behavioral disorders: Secondary | ICD-10-CM | POA: Diagnosis not present

## 2023-07-03 DIAGNOSIS — I10 Essential (primary) hypertension: Secondary | ICD-10-CM | POA: Diagnosis not present

## 2023-07-03 DIAGNOSIS — Z1383 Encounter for screening for respiratory disorder NEC: Secondary | ICD-10-CM | POA: Diagnosis not present

## 2023-07-03 DIAGNOSIS — H6123 Impacted cerumen, bilateral: Secondary | ICD-10-CM | POA: Diagnosis not present

## 2023-07-15 ENCOUNTER — Ambulatory Visit: Payer: PPO | Admitting: Internal Medicine

## 2023-07-16 ENCOUNTER — Other Ambulatory Visit: Payer: Self-pay | Admitting: Internal Medicine

## 2023-07-21 DIAGNOSIS — Z79899 Other long term (current) drug therapy: Secondary | ICD-10-CM | POA: Diagnosis not present

## 2023-07-21 DIAGNOSIS — Z9109 Other allergy status, other than to drugs and biological substances: Secondary | ICD-10-CM | POA: Diagnosis not present

## 2023-07-21 DIAGNOSIS — H6121 Impacted cerumen, right ear: Secondary | ICD-10-CM | POA: Diagnosis not present

## 2023-07-21 DIAGNOSIS — E785 Hyperlipidemia, unspecified: Secondary | ICD-10-CM | POA: Diagnosis not present

## 2023-07-21 DIAGNOSIS — M1990 Unspecified osteoarthritis, unspecified site: Secondary | ICD-10-CM | POA: Diagnosis not present

## 2023-07-21 DIAGNOSIS — I10 Essential (primary) hypertension: Secondary | ICD-10-CM | POA: Diagnosis not present

## 2023-08-26 ENCOUNTER — Ambulatory Visit: Payer: Medicare HMO | Admitting: Podiatry

## 2023-08-26 ENCOUNTER — Encounter: Payer: Self-pay | Admitting: Podiatry

## 2023-08-26 DIAGNOSIS — I739 Peripheral vascular disease, unspecified: Secondary | ICD-10-CM

## 2023-08-26 DIAGNOSIS — M79675 Pain in left toe(s): Secondary | ICD-10-CM | POA: Diagnosis not present

## 2023-08-26 DIAGNOSIS — M79674 Pain in right toe(s): Secondary | ICD-10-CM

## 2023-08-26 DIAGNOSIS — B351 Tinea unguium: Secondary | ICD-10-CM | POA: Diagnosis not present

## 2023-08-26 NOTE — Progress Notes (Signed)
 Subjective: Monique [name] is a 83 y.o. female patient seen today in office with complaint of mildly painful thickened and elongated toenails; unable to trim. Patient denies history of Diabetes, Neuropathy, or known vascular disease but states that her husband used to trim her toenails and he is deceased now and can no longer do it so she needs help has difficulty reaching and seeing her toes to properly trim them. Patient has no other pedal complaints at this time.   Patient Active Problem List   Diagnosis Date Noted   Vasomotor rhinitis 04/11/2021   Urticaria 12/15/2019   MCI (mild cognitive impairment) 02/06/2017   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   GERD (gastroesophageal reflux disease) 06/05/2016   PCP NOTES >>>>>>>>>>>>>>>>>>>>>>>>> 02/04/2016   Severe allergic reaction 08/17/2014   Allergic rhinitis 12/01/2011   Thrombocytopenia (HCC) 08/27/2011   Hyperlipidemia 01/20/2011   Annual physical exam 09/18/2010   Hyperglycemia 09/17/2009   Backache 07/10/2008   Anxiety and depression 01/19/2008   HX OF GALLSTONE 10/20/2006   Essential hypertension 09/29/2006    Current Outpatient Medications on File Prior to Visit  Medication Sig Dispense Refill   acetaminophen (TYLENOL) 500 MG tablet Take 500-1,000 mg by mouth daily as needed.     blood glucose meter kit and supplies Dispense based on patient and insurance preference. Check blood sugars no more than twice daily 1 each 0   brimonidine (ALPHAGAN) 0.2 % ophthalmic solution 1 drop 2 (two) times daily.     Calcium-Vitamin D (CALCIUM + D PO) Take 2 tablets by mouth daily with lunch. Calcium 600mg   Vitamin D 800 Units     carvedilol (COREG) 6.25 MG tablet Take 1 tablet (6.25 mg total) by mouth 2 (two) times daily with a meal. 180 tablet 1   cyclobenzaprine (FLEXERIL) 10 MG tablet Take 1 tablet (10 mg total) by mouth at bedtime as needed for muscle spasms. (Patient not taking: Reported on 04/21/2023) 30 tablet 1   cycloSPORINE (RESTASIS)  0.05 % ophthalmic emulsion Place 1 drop into both eyes 2 (two) times daily.     diclofenac Sodium (VOLTAREN) 1 % GEL Apply topically 4 (four) times daily. If needed     diphenhydrAMINE (BENADRYL) 12.5 MG/5ML liquid Take 25 mg by mouth every 6 (six) hours as needed for allergies. Reported on 08/06/2015     EPINEPHrine (EPIPEN 2-PAK) 0.3 mg/0.3 mL IJ SOAJ injection Inject 0.3 mLs (0.3 mg total) into the muscle once. (Patient not taking: Reported on 01/12/2023) 1 Device 1   ezetimibe (ZETIA) 10 MG tablet Take 1 tablet (10 mg total) by mouth daily. 90 tablet 1   gabapentin (NEURONTIN) 100 MG capsule Take 1-3 capsules (100-300 mg total) by mouth at bedtime as needed (nerve pain). (Patient not taking: Reported on 01/05/2023) 90 capsule 3   glucose blood (ONETOUCH ULTRA) test strip CHECK BLOOD SUGAR NO MORE THAN TWICE DAILY 100 strip 12   Lancets (ONETOUCH DELICA PLUS LANCET33G) MISC CHECK BLOOD SUGAR NO MORE THAN TWICE DAILY 100 each 12   latanoprost (XALATAN) 0.005 % ophthalmic solution Place 1 drop into the left eye at bedtime. Dr. Harriette Bouillon     Magnesium Oxide -Mg Supplement 250 MG TABS Take 1 tablet (250 mg total) by mouth daily.  0   meclizine (ANTIVERT) 25 MG tablet Take 25 mg by mouth daily as needed for dizziness.     mometasone (ELOCON) 0.1 % cream      ondansetron (ZOFRAN-ODT) 4 MG disintegrating tablet Take 4-8 mg by mouth every 4 (four)  hours as needed. (Patient not taking: Reported on 01/12/2023)     Polyethyl Glycol-Propyl Glycol 0.4-0.3 % SOLN Place 1 drop into both eyes daily as needed (dry eyes). Systane     spironolactone (ALDACTONE) 25 MG tablet Take 0.5 tablets (12.5 mg total) by mouth daily.     triamcinolone cream (KENALOG) 0.1 %      vitamin C (ASCORBIC ACID) 500 MG tablet Take 500 mg by mouth daily.     No current facility-administered medications on file prior to visit.    Allergies  Allergen Reactions   Bee Venom Anaphylaxis   Doxycycline Hives and Itching   Penicillins  Itching, Nausea And Vomiting and Swelling    syncope   Bee Pollen     Other reaction(s): Unknown   Azithromycin Itching and Rash   Bactrim [Sulfamethoxazole-Trimethoprim] Other (See Comments)    Stomach pains   Cerumenex [Trolamine (Triethanolamine)] Itching and Rash   Climara [Estradiol] Itching and Rash   Nickel Rash   Sertraline Other (See Comments)    Dry Mouth    Objective: Physical Exam  General: Well developed, nourished, no acute distress, awake, alert and oriented x 3  Vascular: Dorsalis pedis artery 1/4 bilateral, Posterior tibial artery 1/4 bilateral, skin temperature warm to warm proximal to distal bilateral lower extremities, mild varicosities, scant pedal hair present bilateral.  Trace edema to ankles bilateral.  Neurological: Gross sensation present via light touch bilateral.   Dermatological: Skin is warm, dry, and supple bilateral, Nails 1-10 are tender, long, thick, and discolored with mild subungal debris, no webspace macerations present bilateral, no open lesions present bilateral, no significant callus/corns/hyperkeratotic tissue present bilateral. No signs of infection bilateral.  Musculoskeletal: No symptomatic boney deformities noted bilateral. Muscular strength within normal limits without painon range of motion. No pain with calf compression bilateral.  Assessment and Plan:  Problem List Items Addressed This Visit   None     -Examined patient.  -Discussed treatment options for painful mycotic nails. -Mechanically debrided and reduced mycotic nails with sterile nail nipper and dremel nail file without incident. -Advised elevation of legs to assist with edema control and swelling that is occasional at ankles and to avoid foods that have excessive salt including ham -Patient to return in 1 year for follow up evaluation or sooner if symptoms worsen.  Louann Sjogren, DPM

## 2023-09-10 ENCOUNTER — Other Ambulatory Visit: Payer: Self-pay | Admitting: Internal Medicine

## 2024-05-06 ENCOUNTER — Other Ambulatory Visit: Payer: Self-pay | Admitting: Family

## 2024-05-06 DIAGNOSIS — Z1231 Encounter for screening mammogram for malignant neoplasm of breast: Secondary | ICD-10-CM

## 2024-06-16 ENCOUNTER — Inpatient Hospital Stay: Admission: RE | Admit: 2024-06-16 | Discharge: 2024-06-16 | Attending: Family | Admitting: Family

## 2024-06-16 DIAGNOSIS — Z1231 Encounter for screening mammogram for malignant neoplasm of breast: Secondary | ICD-10-CM

## 2024-08-24 ENCOUNTER — Ambulatory Visit: Payer: PPO | Admitting: Podiatry

## 2024-08-31 ENCOUNTER — Ambulatory Visit: Admitting: Podiatry
